# Patient Record
Sex: Female | Born: 1960 | Race: White | Hispanic: No | Smoking: Current every day smoker
Health system: Southern US, Community
[De-identification: ages and names within clinical notes are randomized; demographics above are authoritative.]

## PROBLEM LIST (undated history)

## (undated) DIAGNOSIS — F17200 Nicotine dependence, unspecified, uncomplicated: Secondary | ICD-10-CM

## (undated) DIAGNOSIS — K219 Gastro-esophageal reflux disease without esophagitis: Secondary | ICD-10-CM

## (undated) DIAGNOSIS — F329 Major depressive disorder, single episode, unspecified: Secondary | ICD-10-CM

## (undated) DIAGNOSIS — J449 Chronic obstructive pulmonary disease, unspecified: Secondary | ICD-10-CM

## (undated) DIAGNOSIS — N3281 Overactive bladder: Secondary | ICD-10-CM

## (undated) DIAGNOSIS — F419 Anxiety disorder, unspecified: Secondary | ICD-10-CM

## (undated) DIAGNOSIS — N3010 Interstitial cystitis (chronic) without hematuria: Secondary | ICD-10-CM

## (undated) DIAGNOSIS — I10 Essential (primary) hypertension: Secondary | ICD-10-CM

## (undated) DIAGNOSIS — F32A Depression, unspecified: Secondary | ICD-10-CM

## (undated) DIAGNOSIS — E785 Hyperlipidemia, unspecified: Secondary | ICD-10-CM

## (undated) DIAGNOSIS — IMO0001 Reserved for inherently not codable concepts without codable children: Secondary | ICD-10-CM

## (undated) DIAGNOSIS — E049 Nontoxic goiter, unspecified: Secondary | ICD-10-CM

## (undated) DIAGNOSIS — K589 Irritable bowel syndrome without diarrhea: Secondary | ICD-10-CM

## (undated) DIAGNOSIS — G43909 Migraine, unspecified, not intractable, without status migrainosus: Secondary | ICD-10-CM

## (undated) DIAGNOSIS — K449 Diaphragmatic hernia without obstruction or gangrene: Secondary | ICD-10-CM

## (undated) HISTORY — DX: Interstitial cystitis (chronic) without hematuria: N30.10

## (undated) HISTORY — PX: SHOULDER SURGERY: SHX246

## (undated) HISTORY — DX: Depression, unspecified: F32.A

## (undated) HISTORY — PX: HERNIA REPAIR: SHX51

## (undated) HISTORY — PX: TONSILLECTOMY: SUR1361

## (undated) HISTORY — DX: Anxiety disorder, unspecified: F41.9

## (undated) HISTORY — DX: Nicotine dependence, unspecified, uncomplicated: F17.200

## (undated) HISTORY — DX: Nontoxic goiter, unspecified: E04.9

## (undated) HISTORY — PX: ADENOIDECTOMY: SHX5191

## (undated) HISTORY — PX: NECK SURGERY: SHX720

## (undated) HISTORY — DX: Major depressive disorder, single episode, unspecified: F32.9

## (undated) HISTORY — PX: CHOLECYSTECTOMY: SHX55

## (undated) HISTORY — DX: Irritable bowel syndrome, unspecified: K58.9

## (undated) HISTORY — DX: Migraine, unspecified, not intractable, without status migrainosus: G43.909

## (undated) HISTORY — DX: Overactive bladder: N32.81

## (undated) HISTORY — PX: ABDOMINAL HYSTERECTOMY: SHX81

---

## 1998-08-01 ENCOUNTER — Encounter: Payer: Self-pay | Admitting: Emergency Medicine

## 1998-08-01 ENCOUNTER — Emergency Department (HOSPITAL_COMMUNITY): Admission: EM | Admit: 1998-08-01 | Discharge: 1998-08-01 | Payer: Self-pay | Admitting: Emergency Medicine

## 2000-03-13 ENCOUNTER — Encounter: Admission: RE | Admit: 2000-03-13 | Discharge: 2000-03-13 | Payer: Self-pay | Admitting: Neurology

## 2000-03-13 ENCOUNTER — Encounter: Payer: Self-pay | Admitting: Neurology

## 2000-03-17 ENCOUNTER — Encounter: Payer: Self-pay | Admitting: Neurology

## 2000-03-17 ENCOUNTER — Encounter: Admission: RE | Admit: 2000-03-17 | Discharge: 2000-03-17 | Payer: Self-pay | Admitting: Neurology

## 2000-06-28 ENCOUNTER — Ambulatory Visit (HOSPITAL_COMMUNITY): Admission: RE | Admit: 2000-06-28 | Discharge: 2000-06-28 | Payer: Self-pay | Admitting: *Deleted

## 2000-06-28 ENCOUNTER — Encounter: Payer: Self-pay | Admitting: Orthopedic Surgery

## 2000-07-10 ENCOUNTER — Encounter: Payer: Self-pay | Admitting: Orthopedic Surgery

## 2000-07-10 ENCOUNTER — Ambulatory Visit (HOSPITAL_COMMUNITY): Admission: RE | Admit: 2000-07-10 | Discharge: 2000-07-10 | Payer: Self-pay | Admitting: Orthopedic Surgery

## 2000-08-30 ENCOUNTER — Encounter: Payer: Self-pay | Admitting: Gastroenterology

## 2000-08-30 ENCOUNTER — Ambulatory Visit (HOSPITAL_COMMUNITY): Admission: RE | Admit: 2000-08-30 | Discharge: 2000-08-30 | Payer: Self-pay | Admitting: Gastroenterology

## 2000-08-31 ENCOUNTER — Encounter: Payer: Self-pay | Admitting: *Deleted

## 2000-09-04 ENCOUNTER — Encounter: Payer: Self-pay | Admitting: *Deleted

## 2000-09-04 ENCOUNTER — Ambulatory Visit (HOSPITAL_COMMUNITY): Admission: RE | Admit: 2000-09-04 | Discharge: 2000-09-05 | Payer: Self-pay | Admitting: *Deleted

## 2000-09-05 ENCOUNTER — Encounter: Payer: Self-pay | Admitting: *Deleted

## 2000-10-17 ENCOUNTER — Encounter: Payer: Self-pay | Admitting: *Deleted

## 2000-10-17 ENCOUNTER — Ambulatory Visit (HOSPITAL_COMMUNITY): Admission: RE | Admit: 2000-10-17 | Discharge: 2000-10-17 | Payer: Self-pay | Admitting: *Deleted

## 2001-02-20 ENCOUNTER — Encounter: Payer: Self-pay | Admitting: *Deleted

## 2001-02-20 ENCOUNTER — Ambulatory Visit (HOSPITAL_COMMUNITY): Admission: RE | Admit: 2001-02-20 | Discharge: 2001-02-20 | Payer: Self-pay | Admitting: *Deleted

## 2001-02-26 ENCOUNTER — Encounter (INDEPENDENT_AMBULATORY_CARE_PROVIDER_SITE_OTHER): Payer: Self-pay | Admitting: Specialist

## 2001-02-26 ENCOUNTER — Encounter: Payer: Self-pay | Admitting: *Deleted

## 2001-02-26 ENCOUNTER — Ambulatory Visit (HOSPITAL_COMMUNITY): Admission: RE | Admit: 2001-02-26 | Discharge: 2001-02-27 | Payer: Self-pay | Admitting: *Deleted

## 2003-01-17 ENCOUNTER — Ambulatory Visit (HOSPITAL_COMMUNITY): Admission: RE | Admit: 2003-01-17 | Discharge: 2003-01-17 | Payer: Self-pay | Admitting: *Deleted

## 2003-01-17 ENCOUNTER — Encounter: Payer: Self-pay | Admitting: *Deleted

## 2003-01-20 ENCOUNTER — Ambulatory Visit (HOSPITAL_COMMUNITY): Admission: RE | Admit: 2003-01-20 | Discharge: 2003-01-20 | Payer: Self-pay | Admitting: *Deleted

## 2003-01-20 ENCOUNTER — Encounter: Payer: Self-pay | Admitting: *Deleted

## 2003-01-28 ENCOUNTER — Encounter: Payer: Self-pay | Admitting: *Deleted

## 2003-01-28 ENCOUNTER — Ambulatory Visit (HOSPITAL_COMMUNITY): Admission: RE | Admit: 2003-01-28 | Discharge: 2003-01-28 | Payer: Self-pay | Admitting: *Deleted

## 2003-02-11 ENCOUNTER — Inpatient Hospital Stay (HOSPITAL_COMMUNITY): Admission: RE | Admit: 2003-02-11 | Discharge: 2003-02-12 | Payer: Self-pay | Admitting: *Deleted

## 2003-02-11 ENCOUNTER — Encounter: Payer: Self-pay | Admitting: *Deleted

## 2004-03-24 ENCOUNTER — Ambulatory Visit: Payer: Self-pay | Admitting: Specialist

## 2004-12-05 ENCOUNTER — Emergency Department: Payer: Self-pay | Admitting: Unknown Physician Specialty

## 2005-01-25 ENCOUNTER — Inpatient Hospital Stay: Payer: Self-pay | Admitting: Obstetrics and Gynecology

## 2005-01-25 ENCOUNTER — Other Ambulatory Visit: Payer: Self-pay

## 2005-02-26 ENCOUNTER — Emergency Department (HOSPITAL_COMMUNITY): Admission: EM | Admit: 2005-02-26 | Discharge: 2005-02-26 | Payer: Self-pay | Admitting: Emergency Medicine

## 2005-03-03 ENCOUNTER — Emergency Department (HOSPITAL_COMMUNITY): Admission: EM | Admit: 2005-03-03 | Discharge: 2005-03-03 | Payer: Self-pay | Admitting: Emergency Medicine

## 2005-05-06 ENCOUNTER — Ambulatory Visit (HOSPITAL_COMMUNITY): Admission: RE | Admit: 2005-05-06 | Discharge: 2005-05-06 | Payer: Self-pay | Admitting: Neurosurgery

## 2005-06-07 ENCOUNTER — Ambulatory Visit: Payer: Self-pay | Admitting: Cardiovascular Disease

## 2006-02-21 ENCOUNTER — Encounter: Admission: RE | Admit: 2006-02-21 | Discharge: 2006-02-21 | Payer: Self-pay | Admitting: Cardiology

## 2007-10-09 ENCOUNTER — Encounter: Admission: RE | Admit: 2007-10-09 | Discharge: 2007-10-09 | Payer: Self-pay | Admitting: Cardiology

## 2007-10-18 ENCOUNTER — Encounter: Admission: RE | Admit: 2007-10-18 | Discharge: 2007-10-18 | Payer: Self-pay | Admitting: Cardiology

## 2007-11-14 ENCOUNTER — Ambulatory Visit: Payer: Self-pay | Admitting: Vascular Surgery

## 2007-11-14 ENCOUNTER — Ambulatory Visit: Payer: Self-pay | Admitting: Cardiology

## 2007-11-14 ENCOUNTER — Encounter (INDEPENDENT_AMBULATORY_CARE_PROVIDER_SITE_OTHER): Payer: Self-pay | Admitting: *Deleted

## 2007-11-14 ENCOUNTER — Inpatient Hospital Stay (HOSPITAL_COMMUNITY): Admission: EM | Admit: 2007-11-14 | Discharge: 2007-11-16 | Payer: Self-pay | Admitting: Emergency Medicine

## 2008-01-09 ENCOUNTER — Emergency Department: Payer: Self-pay | Admitting: Emergency Medicine

## 2008-01-30 ENCOUNTER — Encounter
Admission: RE | Admit: 2008-01-30 | Discharge: 2008-01-31 | Payer: Self-pay | Admitting: Physical Medicine & Rehabilitation

## 2008-01-31 ENCOUNTER — Ambulatory Visit: Payer: Self-pay | Admitting: Physical Medicine & Rehabilitation

## 2008-02-11 ENCOUNTER — Emergency Department: Payer: Self-pay | Admitting: Emergency Medicine

## 2009-09-18 DEATH — deceased

## 2009-10-02 ENCOUNTER — Ambulatory Visit: Payer: Self-pay | Admitting: Surgery

## 2010-01-27 ENCOUNTER — Ambulatory Visit: Payer: Self-pay | Admitting: Surgery

## 2010-02-01 ENCOUNTER — Ambulatory Visit: Payer: Self-pay | Admitting: Surgery

## 2010-07-10 ENCOUNTER — Encounter: Payer: Self-pay | Admitting: *Deleted

## 2010-07-11 ENCOUNTER — Encounter: Payer: Self-pay | Admitting: Orthopedic Surgery

## 2010-11-02 NOTE — H&P (Signed)
[name], [name]              ACCOUNT NO.:  [id number]   MEDICAL RECORD NO.:  [id number]          PATIENT TYPE:  EMS   LOCATION:  MAJO                         FACILITY:  [facility]   PHYSICIAN:  Madaline Savage, MD        DATE OF BIRTH:  03/12/1961   DATE OF ADMISSION:  11/13/2007  DATE OF DISCHARGE:                              HISTORY & PHYSICAL   PRIMARY CARE PHYSICIAN:  [facility].   CHIEF COMPLAINTS:  Altered mental status, slurred speech.   HISTORY OF PRESENT ILLNESS:  [name] is a 50 year old, obese lady  with a history of hypertension and hyperlipidemia who comes in with  acute onset of slurred speech and confusion.  Ms. Torre tells me that  she has not been feeling well for the last 1 year.  She states that she  has been feeling tired without any energy and for the last couple of  days she said she felt very confused and felt very foggy.  This morning  she had slurred speech which lasted all day.  She is on narcotics for  her chronic headache, but she states she has not taken any extra pain  medications recently.  She denies any weakness, any sensory loss.  At  this time she is back to her baseline.   PAST MEDICAL HISTORY:  1. Hypertension.  2. Hyperlipidemia.  3. History of refractory headaches.  4. Gastroesophageal reflux disease.   PAST SURGICAL HISTORY:  She has had multiple back surgeries for chronic  back pain.  She is on narcotics chronically.   ALLERGIES:  She is allergic to SULFA, ASPIRIN, IBUPROFEN.   CURRENT MEDICATIONS:  She is not able to tell me all her medication she  takes at home.  She tells me she is on:  1. Imitrex 100 mg as needed.  2. She is also on Percocet 1 tablet every 4 hours as needed.  3. Ambien 10 mg at bedtime.  4. She is also on Premarin but she does not know the dose of      medication.   SOCIAL HISTORY:  She is disabled.  She has three kids who are healthy.  She smokes six to seven cigarettes a day.  She states  she has smoked for  approximately 30 years.  Denies any history of alcohol or drug abuse.   FAMILY HISTORY:  Her father is 1.  He has had bypass graft in the past.  Her mother is 13.  She has chronic back pain and is bedridden from her  back pain.   REVIEW OF SYSTEMS:  GENERAL:  She denies any recent weight loss or  weight gain.  She denies any fever or chills.  HEENT:  No headaches, no  blurred vision or sore throat.  CARDIOVASCULAR SYSTEM:  Denies chest  pain, palpitations.  RESPIRATORY SYSTEM:  Shortness of breath or cough.  GI:  No abdominal pain, nausea, vomiting, diarrhea, constipation.  GENITOURINARY:  No dysuria, polyuria.  EXTREMITIES:  No tingling or  numbness of toes or fingers.   PHYSICAL EXAMINATION:  GENERAL:  She is alert  and oriented x3.  VITAL SIGNS:  Temperature is 98.3, pulse rate of 76, blood pressure is  131/62, respiratory rate 18, oxygen saturation 99% on room air.  HEENT:  Head atraumatic, normocephalic.  Pupils bilaterally equal and  react to light.  Mucous membranes appear moist.  NECK:  Supple.  No JVD,  no carotid.  CARDIOVASCULAR:  S1 and S2.  Regular rhythm.  CHEST:  Clear to auscultation.  ABDOMEN:  Soft.  Bowel sounds heard.  EXTREMITIES:  No edema.  Some clubbing.  CENTRAL NERVOUS SYSTEM:  No focal neurological deficits identified.  Speech is normal.   LABORATORY DATA:  White count 60, hemoglobin 13.7, platelets 241,000.  Sodium 137, potassium 3.8, chloride 102, bicarb 29, and BUN 5,  creatinine 0.6.  Glucose 92.  AST 19, ALT 24, alk phos 651.  Urine drug  screen is positive for benzodiazepine and opiates.  Urinalysis is  negative.  Alcohol is less than 5.  Phenobarb level is less than 5.  INR  is 0.9.  CT head showed no acute intracranial abnormalities.   IMPRESSION:  1. Slurred speech and altered mental status, likely a transient      ischemic attack.  2. Hypertension.  3. Hyperlipidemia.  4. Gastroesophageal reflux disease.   PLAN:  1.  Altered mental status.  This is a 50 year old lady with history of      hypertension and hyperlipidemia who comes in with slurred speech      for 1 day duration.  She also states she was confused yesterday.      This could likely be a transient ischemic attack.  Her initial CT      scan does not show any obvious abnormalities.  Will get an MRI/MRA      on her.  I will also do a 2-D echocardiogram and carotid Dopplers.      She will need aggressive risk factor modification. I will get a      fasting lipid profile, homocystine, hemoglobin A1C.  She does not      remember which medications she takes at home for her blood      pressure.  We will restart her medications when she brings her      medications tomorrow.  There is also possibility that her altered      mental status is likely due to narcotic pain medications and      benzodiazepines.  I will decrease her dose of Ambien at this time      and treated her with IV Dilaudid at this time.  2. Hypertension.  Blood pressure is slightly elevated at this time.      Will restart her blood pressure medications once her dose of      medications are known.  3. Hyperlipidemia.  Will get a fasting profile on her.  4. I will put on DVT prophylaxis.      Madaline Savage, MD  Electronically Signed     PKN/MEDQ  D:  11/14/2007  T:  11/14/2007  Job:  [id number]

## 2010-11-02 NOTE — Discharge Summary (Signed)
NAMENAIJAH, LACEK              ACCOUNT NO.:  [id number]   MEDICAL RECORD NO.:  [id number]          PATIENT TYPE:  INP   LOCATION:  5501                         FACILITY:  [facility]   PHYSICIAN:  Isidor Holts, M.D.  DATE OF BIRTH:  06/11/1961   DATE OF ADMISSION:  11/13/2007  DATE OF DISCHARGE:  11/16/2007                               DISCHARGE SUMMARY   PRIMARY MEDICAL DOCTOR:  Laverna Peace, MD, [facility].   DISCHARGE DIAGNOSES:  1. Possible transient ischemic attack.  2. Dyslipidemia.  3. Migraine headaches.  4. Chronic back pain.  5. Gastroesophageal reflux disease.  6. Hypertension.  7. Morbid obesity.  8. Medication side effect.   DISCHARGE MEDICATIONS:  1. Premarin 0.625 mg p.o. daily.  2. Simvastatin 40 mg p.o. q.h.s.  3. Zetia 10 mg p.o. daily.  4. Allegra 180 mg p.o. p.r.n. daily.  5. Protonix 40 mg p.o. daily.  6. Robaxin 500 mg p.o. t.i.d.  7. Opana ER 10 mg p.o. b.i.d. (was on 20 mg p.o. b.i.d.)  8. Percocet (10/325) one p.o. p.r.n. q.6 h.  9. Plavix 75 mg p.o. daily.   PROCEDURES:  1. Head CT scan dated Nov 13, 2007  This showed no acute intracranial      abnormality.  2. Brain MRI dated Nov 14, 2007.  This was a normal examination.  3. Bilateral carotid/vertebral artery duplex scans dated Nov 14, 2007.      This showed  bilaterally mild intimal wall changes of the CCA.  No      evidence of significant plaque noted.  Right vertebral artery flow      is antegrade.  Left vertebral artery flow could not be adequately      evaluated due to several neck surgeries.  No significant right or      left ICA stenosis noted.  4. A 2-D echocardiogram dated Nov 14, 2007.  This showed overall      normal left ventricular systolic function.  EF 60%.  This study was      inadequate for evaluation of left ventricular regional wall motion.      It is was technically difficult.  No obvious cardiac source of      embolus.  No obvious PFO.   CONSULTATIONS:  None.   ADMISSION HISTORY:  As in H&P notes of Nov 13, 2007 dictated by Dr. Oneita Hurt.  However in brief, this is a 50 year old female, with history of  hypertension, dyslipidemia, refractory migraine headaches, GERD, chronic  back pain status post multiple previous back surgeries on chronic pain  medication.  She presents with altered mental status, described as acute  confusion associated with slurred speech.  However on detailed  questioning, it appears that she has been feeling progressively tired.  Over the last few days, she has been feeling very confused and foggy.  On initial examination, she had no obvious focal neurologic deficit.  However, she was admitted for further evaluation, investigation and  management.   CLINICAL COURSE:  1. Possible transient ischemic attack.  For details of presentation,  refer to the admission history, above.  The patient had transient      slurred speech and altered mental status which appeared clinically      consistent with transient ischemic attack.  However, at the time of      physical examination, these findings were no longer present.  She      underwent full CVA workup.  For details of findings, refer to the      procedure list above.  These were negative.  She has been commenced      on antiplatelet medication, i.e. Plavix, given her history of      allergy to Aspirin.  Risk factor modification is to be pursued.   1. Medication side effects.  The patient is chronically on opioid      medications and antihistaminics.  During the course of her      hospitalization, she quite frequently requested Percocet for pain      control.  Requested intravenous antihistaminics for itching.  It      is likely that the feeling of fogginess that she has been      describing, may be due to medication side effect.  Be that as it      may, her opioid medications were reduced by half and her mental      status dramatically improved  during the course of her      hospitalization.  She has been counseled to pursue further      reduction in her opioid medication.  However, we shall defer this      to her primary MD.   1. Chronic back pain.  The patient has a history of chronic back pain      as outlined above and has had previous multiple surgeries.  During      the course of her hospitalization, back pain did not prove      problematic.  She was evaluated by PT/OT and recommended continued      PT/OT as an outpatient.  We have recommended that her primary MD      arrange this.   1. Hypertension.  The patient's blood pressure remained low normal,      during the course of her hospitalization off antihypertensive      medications.  As a matter of fact on Nov 16, 2007, BP was 100/68      mmHg.  Her Micardis has therefore been discontinued without any      deleterious effects.  We anticipate her primary MD will continue to      monitor her BP and may wish to reinstate medications, if this      proves to be indicated.  We have also counseled the patient to      attempt to lose some weight as she may be able to stay off      antihypertensive medications.  It is also feasible that low BP may      have been contributory to her presenting symptoms.   1. History of gastroesophageal reflux disease.  There were no problems      referable to this.  The patient was managed with proton pump      inhibitors.   1. History of migraines.  This was managed with p.r.n. analgesics.      Imitrex has been discontinued, against background of possible TIA.   1. Dyslipidemia.  The patient's lipid profile was as follows:  Total      cholesterol 136,  triglycerides 74, HDL 39, ALT 82.  She continues      on pre-admission lipid-lowering medications.   DISPOSITION:  The patient was on Nov 16, 2007 considered sufficiently  clinically recovered and stable to be discharged.  She was therefore  discharged accordingly.   ACTIVITY:  As  tolerated.   DIET:  Heart-healthy.   FOLLOW UP INSTRUCTIONS:  The patient is recommended to follow up with  her primary MD Dr. Maryruth Hancock [facility]  in 1-2 weeks.  She has been  instructed to call for an appointment.   DISCHARGE INSTRUCTIONS:  1. The patient is recommended outpatient physical therapy/occupational      therapy.  We  recommend that the primary MD arrange this as this      may be quite helpful for her back pain.   Note:  The following medications have been discontinued:  Micardis,  Imitrex, Robinul, until reviewed by the patient's primary MD.  All this  has been communicated to the patient.  She has verbalized understanding.      Isidor Holts, M.D.  Electronically Signed     CO/MEDQ  D:  11/16/2007  T:  11/16/2007  Job:  [id number]   cc:   Laverna Peace, MD

## 2010-11-02 NOTE — Group Therapy Note (Signed)
REASON FOR VISIT:  Consult requested for the evaluation of neck pain and  back pain.   HISTORY:  A 50 year old female who has a long history of neck pain.  She  has had 3 cervical surgeries.  In 2002, she reports having had 2  cervical surgeries and in 2004, her third and final.  Dr. Hessie Knows  did the surgery.  She has had persistent pain postoperatively, really  does not think it has helped any.  She has migraine-type pain as well.  In addition, she has low back pain, which really started about a year  ago.  She has had no history of trauma to the low back region.  She has  been treated with narcotic analgesics by her primary care, however, had  an episode last May where she was admitted for mental status changes.  A  stroke was ruled out, and it was felt that the altered mental status was  due to narcotic analgesic medications.  She was in the ER at the time  with some oxycodone.  She was discharged on a lower dose.   PAST SURGICAL HISTORY:  As noted above.  She has had C5-C6, C6-C7  microdiskectomy, and fusion as well as corpectomy at C6 in 2002.  She  had an extended fusion from C2 through C6 in September of 2002, and then  another procedure where previous hardware was removed and fused from C4  to C1.   Her pain level was rated as 10/10.  Sleep is poor.  Pain improves with  medications.  She walks 10 minutes at a time, but overall has very  limited functional ambulation distance.  She can climb steps, she  drives, she has some family help with meal prep, household duties, and  shopping.  She indicates she has been on disability since 2002.   REVIEW OF SYSTEMS:  Positive for depression and anxiety, but negative  for suicidal thoughts.  Review of systems is also positive for weight gain and reflux.  Her  other past medical history is significant for hypertension and  hyperlipidemia.   CURRENT MEDICATIONS:  She is not taking any pain medications.  She is on  Hyzaar, Zetia,  glycopyrrolate, Protonix, Premarin, Zocor, aspirin,  Allegra, and Imitrex as well as alprazolam 1 mg t.i.d.   FAMILY HISTORY:  Heart disease, lung disease, diabetes, high blood  pressure, and disability.   SOCIAL HISTORY:  Divorced, lives with parents.  Smoked half pack a day.  Denies any alcohol or drug abuse.   PHYSICAL EXAMINATION:  VITAL SIGNS:  Blood pressure 143/93, pulse 91,  respirations 18, and O2 sat 98% on room air.  GENERAL:  No acute distress.  Mood and affect appropriate.  Gait is  normal.  She is able to toe walk and heel walk.  Her neck has  essentially no range of motion.  She has no significant hypersensitivity  over the posterior scar.  She does have defect from the multiple surgery  sites with scarring.  She has tenderness to palpation in the thoracic  and lumbar paraspinal.  She has full strength in bilateral deltoid,  biceps, triceps grip as well as hip flexor, knee extensor, and  dorsiflexion.  She has normal sensation in bilateral upper and lower  extremities.  Normal deep tendon reflexes in bilateral upper and lower  extremities.  Her lumbar spine range of motions is 75% forward flexion  and extension.  Slightly more pain with extension than with flexion.   Review  of MRI findings:  Lumbar spine, she really has minimal disk  bulges at L3-L4, L4-L5, and these are more likely just age related  rather than source of pain.   In addition, she has had a repeat MRI of the cervical spine which shows  no complications from her prior fusion.   IMPRESSION:  1. Chronic postoperative pain, chronic neck pain.  2. Lumbar axial pain, no evidence of radiculopathy.  MRI showing no      significant disk problems.  May have facet versus sacroiliac      involvement versus myofascial.   PLAN:  1. Overall, given her problem with altered mental status requiring      hospitalization, we will not treat with narcotic analgesics.  We      will start her on tramadol 50 mg t.i.d.   2. Due to decreased her activity she has been deconditioned.  She has      fear-avoidance behaviors with exercise.  We will have her start      with some physical therapy and not have them work anything with her      cervical spine given that she was basically fused from head to      upper thorax.  We will instead concentrate on thoracic and lumbar      mobility and strengthening as well as lower extremity      strengthening.  I will see her back in 1 month to followup on her      progress.   Thank you for this interesting consultation.  Keep you apprised of her  situation.      Erick Colace, M.D.  Electronically Signed     AEK/MedQ  D:  01/31/2008 15:55:20  T:  02/01/2008 07:06:23  Job #:  [id number]   cc:   Penne Lash

## 2010-11-05 NOTE — Procedures (Signed)
[facility]  Patient:    Katelyn [name], Katelyn [name] Visit Number: [id number] MRN: [id number]          Service Type: DSU Location: [facility] [id number] Attending Physician:  Evonnie Dawes Dictated by:   Donnal Debar Gasper Sells, M.D. Proc. Date: 02/20/01 Admit Date:  02/20/2001   CC:         Clabe Seal. Meryl Crutch, M.D.  Jearld Adjutant, M.D.   Procedure Report  PREOPERATIVE DIAGNOSIS:  Query cervicogenic headache plus-or-minus occipital neuralgia.  POSTOPERATIVE DIAGNOSES: 1. 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 2. Symptomatic left C2 root and ganglion.  OPERATION: 1. Left C4-5, left C3-4 and left C2-3 facet blocks in that order with 1 cc of    0.5% bupivacaine without epinephrine in 0.5 cc aliquots at each level. 2. Left C2 root and ganglion block, 2 cc in 0.5 cc aliquots of    0.5% bupivacaine.  SURGEON:  Ricky D. Gasper Sells, M.D.  ASSISTANT:  Nil.  COMPLICATIONS:  Nil.  ANESTHESIA:  Marcaine 0.5% premedication with 0.2 mg of Robinul.  SUMMARY:  This is a 50 year old female with a long-standing history of neck pain and headache.  I previously did anterior C5-6 and C6-7 diskectomies and fusion, September 04, 2000, with positive results with respect to some of her neck pain and her radiculopathies, but this did not affect her headache in any way. These have become very difficult to manage.  She is on Zoloft, Premarin, Neurontin, Zanaflex and Imitrex for her headaches and is incompletely controlled on these medications.  Her story is difficult to follow.  The headaches can be bilaterally, they can start on the left, they can start on the right, seem to detonate a vascular type of headache.  There is an underlying rumbling cervical component to her pain and when it reaches a certain threshold level, bang, she gets a headache.  Today, she was primarily symptomatic on the left side.  She had had a  recent exacerbation of her trouble where she was incapacitated for a day or so just a few days prior, but her headaches were about at an average level today, again, primarily left-sided.  A preoperative video flexion/extension view did not definitively show any abnormal motion, however, the C4-5 level on the left was suspicious.  She had point tenderness over the superomesial spine of the scapula, which is the origin of the levator ______ scapulae muscle or so-called Maights point. She was also tender over the upper cervico-zygapophyseal joints to palpation. She also had point tenderness over the greater and lesser occipital nerves on the left.  I blocked her left C4-5 joint, following that the C3-4 joint and then the C2-3 joints, the C2-3 and C3-4 being the more symptomatic levels. She still had residual hemicranial pain.  I then inserted a needle down to the mid-third of the C1-2 joint in the AP projection and blocked that level and this completely relieved her left-sided discomfort.  This implies that the upper cervical spine including the C2 root and ganglion is intimately related to the genesis of her headache problem and in addition, her neck pain.  Medical versus surgical options will be discussed with the patient.  DESCRIPTION OF PROCEDURE:  With the patient in the prone position and with the patients chest supported by a pillow, with the upper cervical spine and skull prepped with Betadine solution and with the C-arm in place, three 25-gauge needles were  inserted down to the C4-5, C3-4 and C2-3 joints in that order on the left side.  Poking the dorsolateral aspects of these joints re-created her more lateral discomfort and some pain radiating into the left ear.  The C2-3 and C3-4 joints were more symptomatic than the C4-5 level.  I then blocked the C4-5 level with 1 cc of 0.5% bupivacaine in 0.5 cc aliquots, blocking the dorsal aspect of the joint first, and then moving the  needle more laterally, to catch in this case the fifth occipital nerve.  This gave her minimal relief of her left-sided discomfort, however, some relief.  I then blocked the C3-4 joint with exactly the same technique, followed by the C2-3 joint and this blocked the more lateral aspect of her headache as well as much of her neck pain.  She still had a significant amount of headache left.  I then inserted a 25-gauge spinal needle down to the mid one-third of the C1-2 joint, which is the location of the C2 root and ganglion; I blocked that level with a total of 2 cc of 0.5% bupivacaine in 0.5 cc aliquots.  This took care of the remainder of her pain and relieved her headache.  Based on this study, one can conclude that the upper cervical structures are intimately related to the cervicogenic component of her headache and that she has a component of occipital neuralgia or C2 root and ganglion dysfunction as well.  Surgical versus continued medical options will be discussed with the patient.  Patient tolerated the procedure well and was resting quietly in the recovery room at the end of the procedure. Dictated by:   Donnal Debar Gasper Sells, M.D. Attending Physician:  Evonnie Dawes DD:  02/20/01 TD:  02/20/01 Job: [id number]

## 2010-11-05 NOTE — Op Note (Signed)
[place]  Patient:    Katelyn [name], Katelyn [name]                       MRN: [id number] Proc. Date: 09/04/00 Adm. 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  POSTOPERATIVE DIAGNOSIS:  Herniated nucleus pulposus, C5-6, centered to the left at C6-7 midline.  SUPPLEMENTARY DIAGNOSIS:  Cervicogenic headache versus muscle spasm headache, which may or may not respond to surgical treatment of the above.  OPERATION: 1. C5-6 to C6-7 microdiskectomy for decompression of the dura of the spinal    cord and C6 and C7 nerve roots bilaterally. 2. Corpectomy of 6 for harvesting autograph for later fusion laterally. 3. Strut graft human __________ fibular bone C5 to C7. 4. Fixation with a 42 mm Codman plate with four 15 mm screws in the body of    C5 and C7, and two 12 mm screws in the body of C6 with one inferior screw    fixing the Codman plate in place.  SURGEON:  Ricky D. Gasper Sells, M.D.  ASSISTANT:  Annie Sable, Montez Hageman., M.D.  COMPLICATIONS:  Nil.  DRAINS:  Nil.  ESTIMATED BLOOD LOSS:  Less than 100 cc.  SUMMARY:  This is a 50 year old, white, right-handed female, who presented with severe neck pain, headache, numbness, and tingling primarily involving the thumb, index, and middle fingers of the left hand, with aching in the biceps and triceps on the left.  This actually spread by the time she had surgery, a week later, roughly to involve the right hand as well.  She had an MRI dated May 30, 2000, which showed a moderate sized left-sided herniation at C5-6 and a subligamentous ruptured C6-7 or bulging disk with elevation of the posterior longitudinal ligament.  Her foramen compression test of the left is positive.  Her axial compression test increased her pain. She had slightly decreased biceps and  triceps jerks in the left as compared to the right.  She had a moderate degree of myalgia in palpating these muscles. There was weakness as well at the same time on the left as compared to the right.  The toes were downgoing and there was no evidence of myelopathy.  She also had headache with tenderness over the greater and lesser occipital nerves bilaterally.  She had no sensory disagreements of the scalp.  The remainder of the examination was nonfocal in that it cleared the patient while the obvious problems be corrected.  This may in fact not help her headache at all. It is possible, however, if she is unstable enough at the C5-6 and C6-7 levels, it may be putting unusual stress in the upper cervical zygapophyseal joints and give her headaches on that basis.  I intended to fuse her from C5 to C7 after C5-6 and C6-7 diskectomy, that was carried out today.  Her C6 vertebral body was almost floating freely. It was very unstable at both levels. This was observed not only by me but also by the nurses and Dr. Hope Pigeon.  The diskectomies and fusion and fixation itself was completely uncomplicated.  The patient tolerated the procedure well.  DESCRIPTION OF PROCEDURE:  With the patient in the supine position with the head slightly  extended and elevated with respect to the heart, the patient was prepped and draped in the usual fashion for a right-sided approach of the cervical spine.  The patient had a Bair Hugger blanket in place and PAS hose in place and all pressure points had carefully been inspected and padded. A convenient skin fold was selected and the skin was incised approximately 4 cm in the anterior triangle on the right side along the Langers lines and then dissected subcutaneously.  There was a lot of subcutaneous fat.  The platsyma was divided longitudinally and retracted medially and laterally with a total of four fish hooks.  A plane was developed along the medial aspect of the  jugular vein down to the carotid artery where a left-hand turn was made exposing the anterior cervical spine.  The longus colli muscle was then dissected free of the vertebral bodies of C5, C6 and C7.  A needle was placed in the most superior visible disk space which turned out to be C4-5 by x-ray confirmation.  A Caspar pin was then placed in the vertebral body of C5 and C7 and then Caspar retractors were used to retract the esophagus and trachea medially and the carotid jugular structures laterally thus exposing the C5-6 level.  The microscope was then brought in.  The annulus was then incised at both levels with a 15-blade knife.  Disk material was removed.  A high-speed drill was then used to drill the cartilage end plates at U9-8 and C6-7 off preserving as much bone as possible.  A Lexxel instrument was then used to perform a corpectomy of C6 down to the cortical bone posteriorly.  The high-speed drill was then used to drill off the remaining cartilage end plate down to the posterior longitudinal ligament.  This was divided and removed along with the annulus and disk material that exposed the dura. First, C6-7 level with a foraminotomy at C7 being performed.  There was disk material inside the two layers of ligament particularly to the left side.  The exact same procedure was carried out at C5-6.  The proud posterior longitudinal ligament was then coagulated with bipolar coagulation at both levels to decrease the possibility of OPTL formation.  It also decreases pain.  A custom-fit, truncated, large-dimension, anterior and small dimension posterior fibular strut was then formed and pounded into place.  It fit perfectly.  The patients own bone mixed with Grafton was then packed into the C5-6 and C6-7 lateral spaces to facilitate further bony fusion out to that level, that is a more lateral fusion as well.  A 42 mm plate was then carefully screwed into place using 15 mm screws in  the vertebral body of C5 and C7 and 12 mm screws in the vertebral body of C6 bilaterally, and then a fixation screw into the strut was used through the  most inferior hole of the plate.  A confirmation x-ray was then performed and it appeared that the C5 through C7 levels had been adequately fused with screws in appropriate position.  The wound was then irrigated with Betadine solution and hydrogen peroxide.  A mixture of 25 cc of normal saline and 25 cc of hydrogen peroxide mixed with an ampule of methylene blue was then slowly injected into the NG tube as it was removed you could see it pass by through the filamentous mucosa muscularis of the esophagus.  There were no leaks.  The wound was irrigated one more time with bacitracin solution then the platysma  was closed with three interrupted 2-0 Vicryl sutures.  Subcutaneous closure was carried out with 4-0 Vicryl inverted interrupted x 5, and Steri-Strips were used in the skin and a dry dressing was applied.  The patient tolerated the procedure well and awoke from surgery unremarkably.  A soft collar was placed before she left the operating room. DD:  09/04/00 TD:  09/05/00 Job: [id number]

## 2010-11-05 NOTE — Op Note (Signed)
NAME:  Katelyn [name], Katelyn [name]                          ACCOUNT NO.:  [id number]   MEDICAL RECORD NO.:  [id number]                   PATIENT TYPE:  OIB   LOCATION:  3172                                 FACILITY:  [facility]   PHYSICIAN:  Ricky D. Gasper Sells, M.D.             DATE OF BIRTH:  [birth year]   DATE OF PROCEDURE:  01/20/2003  DATE OF DISCHARGE:                                 OPERATIVE REPORT   PROCEDURE:  1. Bilateral C7-T1 facet block.  2. Fluoroscopic control of same.   SURGEON:  Ricky D. Gasper Sells, M.D.   COMPLICATIONS:  Nil.   ESTIMATED BLOOD LOSS:  Nil.   ANESTHESIA:  With 1 mL of 0.5% bupivacaine without epinephrine, per side.   PREMEDICATION:  With 0.2 mg of Robinul.   INDICATIONS FOR PROCEDURE:  This is a 50 year old female who had an anterior  C5 through C7 fusion about 2-1/2 or 3 years ago, and then two years ago had  a C3 through C6 fused posteriorly.  Certainly her headaches are gone, but  she has had progressive neck pain over the last six months or so.  Three  days ago I did fluoroscopic flexion and extension views on her, and she had  diastatic C7-T1 facet joints clearly demonstrated at that time.  Since this  is bilateral, both of her posterior elements are loose, and therefore she is  unstable; that is 2/3 of her stability structures are unstable.  At the time I took a lead marker and taped it to the end of her finger and  asked her to localize exactly where her neck was tender maximally.  She  placed her finger with the lead marker exactly over the C7-T1 joint area  bilaterally.  I am going to confirm this with an upper cervical block today.  This is carried out without difficulty, apart from the fact that she became  minimally nauseated when she sat up.  She had some postural hypotension,  possibly related to hypovolemia.  The anesthesiologist took care of this.  By discretely blocking the C7-T1 joints, we were able to significantly  reduce her neck pain.  She  was told that she should wear a collar until she  decides to have this fused.  I would rather do it sooner rather than later.   DESCRIPTION OF PROCEDURE:  With the patient in the prone position, the  patient's neck was prepped with Betadine solution and a #25 gauge needle was  inserted down to the C7-T1 facet joint area, first on the right and then on  the left.  Poking this area exquisitely recreated her neck pain.  I then  blocked first the right side with two 0.5 mL aliquots of 0.5% bupivacaine  with aspiration for each injection.  I then blocked the left side with a  similar technique.  The needles were removed.  The patient was sat up.  Her neck pain  was significantly better.                                                Ricky D. Gasper Sells, M.D.   RDH/MEDQ  D:  01/20/2003  T:  01/20/2003  Job:  [id number]
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
## 2010-11-05 NOTE — Op Note (Signed)
[place]  Patient:    Katelyn [name], Katelyn [name]                       MRN: [id number] Adm. Date:  [id number] Attending:  Evonnie Dawes                           Operative Report  PREOPERATIVE DIAGNOSIS:  Herniated nucleus pulposus C5-6 concentric to the left at C6-7 midline.  POSTOPERATIVE DIAGNOSIS:    Herniated nucleus pulposus C5-6 concentric to the left at C6-7 midline.  SUPPLEMENTARY DIAGNOSIS:  ______ versus muscle spasm, headache which may or may not respond to surgical treatment of the above.  OPERATION: 1. C5-6 and C6-7 microdiskectomy for decompression of the dura of the spinal    cord and C6-7 nerve roots bilaterally. 2. Corpectomy of 6 for harvesting autograft for later fusion laterally. 3. Strut graft human ______ fibular bone C5 to C7. 4. Fixation with 42 mm Codman plate with four 15 mm screws in the body of C5    and C7 and two 12 mm screws in the body of C6 with one inferior screw    fixing the Codman plate in place.  SURGEON:  Ricky D. Gasper Sells, M.D.  ASSISTANT:  Annie Sable, Montez Hageman., M.D.  COMPLICATIONS:  Nil.  DRAINS:  Nil.  ESTIMATED BLOOD LOSS:  Less than 100 cc.  SUMMARY:  This is a 50 year old white right-handed female who presented with severe neck pain, headache, and numbness and tingling primarily involving the thumb and index and middle fingers of the left hand and taking in the biceps and triceps on the left.  This actually spread by the time she had surgery a week later, roughly, to involve the right hand as well.  She had an MRI with her dated May 30, 2000 which showed a moderate-sized left-sided herniation at C5-6 and a subligamentous rupture at C6-7 or a bulging disk with elevation of the posterior arm to the ligament.  Her ______ compression test of the left was positive.  Her axial compression test increased her pain.  She had slightly decreased biceps and triceps, jerks on the left as compared  to the right.  She had a moderate degree of myalgia in palpating these muscles. There is weakness as well on the left as compared to the right.  The toes were downgoing.  There was no evidence of myelopathy.  She also had headache with tenderness over the greater and lesser occipital nerves bilaterally.  She had no sensory disturbance at the scalp.  The remainder of the examination was nonfocal.  I made it clear to the patient that, while the obvious problems should be corrected, this may in fact not help her headache at all.  It is possible, however, if she is unstable enough at the C5-6 and C6-7 levels, it may be putting unusual stress in the upper cervical zygapophyseal joints and give her headache on that basis.  I intended to fuse her from C5 to C7 after C5-6 and C6-7 diskectomies.  That was carried out today.  The C6 vertebral body was almost floating freely.  It was very unstable at both levels.  This was observed not only by me but also by the nurses and Dr. Hope Pigeon.  The diskectomies and fusion and fixation itself was completely uncomplicated.  The patient tolerated the procedure well.  DESCRIPTION OF PROCEDURE:  With the patient in the  supine position with the head slightly extended and elevated with respect to the heart, the patient was prepped and draped in the usual fashion for a right-sided approach to the cervical spine.  The patient had a Bair Hugger blanket in place and PS hose in place and all pressure points carefully had been inspected and padded.  A convenient skin fold was selected and the skin was incised approximately 4 cm in the anterior triangle on the right side along the Langers lines and then dissected subcutaneously.  There was a lot of subcutaneous fat.  The platysma was divided longitudinally and retracted medially and laterally with a total of four fishhooks.  A plain was developed along the medial aspect of the jugular vein down to the carotid artery,  where a left-hand turn was made exposing the anterior cervical spine.  Longus colli muscle was then dissected free of the vertebral bodies of C5, C6, and C7.  A needle was placed in the most superior visible disk space, which turned out to be C4-5 by x-ray confirmation.  Caspar pin was then placed in the vertebral body of C5 and C7 and then Caspar retractors were used to retract the esophagus trachea medially and the carotid jugular structures laterally, thus exposing the C5-6 level.  The microscope was then brought in.  The annulus was then incised at both levels with a 15-blade knife.  Disk material was removed.  A high-speed drill was then used to drill cartilage end plates at B1-4 and C6-7 off preserving as much bone as possible.  A ______ instrument was then used to perform a corpectomy of C6 down to the cortical bone posteriorly.  The high-speed drill was then used to drill off the remaining cartilage end plate down to the posterior longitudinal ligament.  This was divided and removed, along with the annulus and disk material to expose the dura.  First, the C6-7 level with a foraminotomy of C7 being performed.  There was disk material inside the two layers of ligament, particularly to the left side.  Exactly the same procedure was carried out at C5-6.  The posterior longitudinal ligament was then coagulated with ______ coagulation at both levels to decrease the possibility of OPPL formation.  It also decreases pain.  A custom-fit truncated large dimension anterior and small dimension posterior fibular strut was then formed and pounded into place.  It fit perfectly.  The patients own bone mixed with graft was then packed into the C5-6 and C6-7 lateral spaces to facilitate through the bony fusion out to that level.  That is a more lateral fusion as well.  A 42-mm plate was then carefully screwed into place using 15-mm screws in the vertebral body of C5 and C7 and 12-mm screws in  the vertebral body of C6 bilaterally, and then a fixation screw into the strut was used through the most inferior hole of the plate.  A confirmation x-ray was then performed and  it appeared that the C5 through C7 levels had been adequately fused with screws in appropriate position.  The wound was then irrigated with Betadine solution and hydrogen peroxide.  A mixture of 25 cc of normal saline and 25 cc of hydrogen peroxide mixed with an ampulla of methylene blue was then slowly injected into the NG tube.  As it was removed, you could see it pass by through the filamentous mucosa and muscularis of the esophagus.  There were no leaks.  The wound was irrigated one more time with  bacitracin solution and the platysma was closed with three interrupted 2-0 Vicryl sutures.  Subcutaneous closure was carried out with 4-0 Vicryl ______ interrupted x 5 and Steri-Strips were used in the skin and a dry dressing was applied.  The patient tolerated the procedure well and awoke from surgery unremarkably.  A soft collar was placed before she left the operating room. DD:  09/04/00 TD:  09/05/00 Job: [id number]

## 2010-11-05 NOTE — Op Note (Signed)
NAME:  Katelyn [name], Katelyn [name]                          ACCOUNT NO.:  [id number]   MEDICAL RECORD NO.:  [id number]                   PATIENT TYPE:  INP   LOCATION:  3172                                 FACILITY:  [facility]   PHYSICIAN:  Ricky D. Gasper Sells, M.D.             DATE OF BIRTH:  1960/08/06   DATE OF PROCEDURE:  DATE OF DISCHARGE:                                 OPERATIVE REPORT   PREOPERATIVE DIAGNOSIS:  Instability C7-T1, well-documented.   INTRAOPERATIVE DIAGNOSIS:  Instability C7-T1 with some instability at C4-5  as evidenced by examining the hardware and abnormal motion at that level.   PROCEDURE:  1. Exposure of the posterior fusion hardware and evaluation of stability.  2. Bilateral C7-T1 cervical laminectomies.  3. Removal of previous posterior hardware C4-C6.  4. Harvesting of autografts spinous process of C5 as well as bone     marrow/blood.  5. Fusion C4-T1 using mixed autograft and allograft (allomatrix mixed with     the patient's own blood, bone marrow, and autograft).  6. Fixation using Synthes axon system with lateral maskers in C4, C5, C6,     and C7, and pedicle screws in T1.   SURGEON:  Ricky D. Gasper Sells, M.D.   ASSISTANT:  Marland Mcalpine. Elesa Hacker, M.D.   COMPLICATIONS:  None.   ESTIMATED BLOOD LOSS:  Less than 150 cc.   SUMMARY:  This is a 50 year old female who had a remote C5-C7 anterior  fusion. She became symptomatic with neck pain and headache after that and  had a C2-C6 fusion done posteriorly about two years ago. This held up until  approximately six months ago when she became symptomatic at the base of her  neck. Fluoroscopic flexion and extension views diastasis of the C7-T1 facet  joint. Blocking that joint later on that same day gave her excellent relief  of her pain. Interestingly, she was able to clearly localize her discomfort  in the C7-T1 level. Based on that it was elected to extend her fusion down  one level.   When the hardware was exposed  the C4-5 level was also loose, so it was  elected to fuse her from C4-T1 with the C4-5 and C7-T1 facet fusions being  done at that time. This was carried. Fixation was with the Synthes axon  pedicle screw top loading system. That was carried out without complication  or difficulty. The patient tolerated the procedure well. No complications  occurred. The patient awoke from surgery without her usual neck pain, but  with soreness from the surgery and no new neurological deficits.   DESCRIPTION OF PROCEDURE:  With the patient in the prone position with the  head in the Mayfield head flap and with the patient carefully positioned in  the neutral position and with the bear hugger blanket in place, PASO is in  place and all pressure points carefully inspected and padded. The patient  was prepped and  draped in the usual fashion for craniocervical approach.   A midline incision was made excising the previous incision and then stepwise  x-rays were done to prove the exact level that was exposed.   On the right side exposure was more complete and a towel clip was used to  grasp the spinous process of C5 and moving that showed some movement of the  C4-5 indicating the fusion was not complete at that level.   We did not get the bottom level, the T1 level on the lateral, so we will  just forget about it and we will get it later some time.   Starting out on the right side the titanium pin, which was lateral, was  divided with a carbide bit and the lateral screws at C4, C5, and C6 were  loosened and the hardware plus the lateral mask screws were removed from C4-  C6 on the right. The levels were carefully inspected and in C4 on the right  a 4x16 mm top loading Axon screw was inserted and then a 4x14 mm screw was  inserted in C5 and C6, leaving the C7 level open. Using a high speed drill a  laminotomy of C7-T1 was then performed on the right and then a nerve hook  was used to palpate and locate the  position of the pedicle and a hole was  drilled down the long axis of the pedicle angulating somewhat medially,  perhaps five degrees for a pedicle screw and a 3.5x16 mm screw was screwed  into the pedicle at C7 on that side. Standard lateral mask screw was then  inserted at C7 and the zygoapophyses joint at C4-5 and at C7-T1 was drilled  and the laminas of C4 and C5 medially were roughed up as was the lamina in  C7 and T1. A custom shaped titanium rod of 80 mm in length was then placed  from C4-T1 and all the screws were gently tightened down. At C5, the pedicle  screw top loading was sprung somewhat and the locking cap was then removed  and retorqued. On the left side exactly the same procedure was carried out  except at C5 the top loading screw had to be crimped a little bit with a  pair of pliers and a fresh cap was placed. The difference on the left side  was that at C4 a 14x4 mm screw was placed instead of a 16 mm screw.   The wound was irrigated with Betadine solution and hydrogen peroxide at that  point and then washed out with Bacitracin solution.   The spinous process at C5 was then harvested and macerated. Then bone marrow  mixed with the patient's own blood, mixed with the allomatrix along with the  patient's allograft and Vancomycin solution in a concentration of 100 mg of  Vancomycin per 25 cc, this required 5 or 6 cc to get the Allograft into the  right putty consistency. This was then packed in at 4-5 and at C6-T1  bilaterally, held in place with a large piece of Surgicel. Self-retaining  retractors were then removed and the dorsal neutral fascia was closed with 2-  0 Vicryl in interrupted fashion. Then 8 cc of Vancomycin solution was  squirted into the subfascial space with the expectation it will leak out  later. Then, the subcutaneous tissue was closed with 2-0 Vicryl inverted interrupted with 3 cc of Vancomycin solution squirted in subcutaneous. Skin  staples were used  in the skin. The patient  was turned into the recovery bed  and the Mayfield head clamp was removed. There was no significant bleeding,  so the patient was placed in an Aspen collar and discharged from the  operating room in good condition moving all four extremities.                                               Ricky D. Gasper Sells, M.D.    RDH/MEDQ  D:  02/11/2003  T:  02/11/2003  Job:  [id number]

## 2010-11-05 NOTE — Op Note (Signed)
   NAME:  Katelyn [name], Katelyn [name]                          ACCOUNT NO.:  [id number]   MEDICAL RECORD NO.:  [id number]                   PATIENT TYPE:  OIB   LOCATION:  NA                                   FACILITY:  [facility]   PHYSICIAN:  Ricky D. Gasper Sells, M.D.             DATE OF BIRTH:  05/22/1961   DATE OF PROCEDURE:  01/17/2003  DATE OF DISCHARGE:                                 OPERATIVE REPORT   PROCEDURE:  Fluoroscopic flexion-extension views of the neck including  oblique views and including patient digital localization of pain in the AP  projection.   In the presence of the radiologist, and with me being present, the patient  was placed in the fluoroscopic unit and a flexion-extension view of her neck  was obtained in the lateral view.  The patient had instrumentation  anteriorly in the cervical spine from C5-C7 and posteriorly in the cervical  spine from C2-C6.  These levels were clearly fused without any evidence of  abnormal motion.  Attention was then directed to the interspinous space at  C7-T1 as well as the facet appearance, especially in the oblique views at  those levels.  It was obvious that there was hypermobility or facet  diastasis of greater than 1.3 mm, actually probably greater than 2 mm, on  both the right and the left at the C7-T1 level, in other words it was  unstable.   The patient was then asked to localize her pain at point of maximal pain  using her index finger on the left and the right with a lead numeral taped  to it without the patient's knowledge of exactly where she was.   The patient spontaneously pointed to exactly the C7-T1 joint on both sides,  albeit on the right side a little bit more laterally.  This is strongly  indicative that that is where her pain is coming from.   Nevertheless, I am going to do an cervical block of her zygohypophyseal  joints in the operating room under fluoroscopic control to see whether that  blocks her pain.  In addition  we are going to get an MRI to make sure there  is no other pathology in the region, i.e., a ruptured disk.  This is not  something one would like to miss.  This will be done at the earliest  possible convenience.                                               Ricky D. Gasper Sells, M.D.    RDH/MEDQ  D:  01/17/2003  T:  01/17/2003  Job:  [id number]

## 2010-11-05 NOTE — Op Note (Signed)
. [facility]  Patient:    PEREL, HAUSCHILD Visit Number: [id number] MRN: [id number]          Service Type: DSU Location: 3000 3027 [id number] Attending Physician:  Evonnie Dawes Dictated by:   Donnal Debar Gasper Sells, M.D. Proc. Date: 02/26/01 Admit Date:  02/26/2001   CC:         Jearld Adjutant, M.D.  Clabe Seal. Meryl Crutch, M.D.   Operative Report  PREOPERATIVE DIAGNOSIS:  Mixed cervicogenic headache, left more so than right, and occipital neuralgia, left more so than right.  POSTOPERATIVE DIAGNOSIS:  Mixed cervicogenic headache, left moreso than right, and occipital neuralgia, left more so than right.  OPERATION:  C2, 3, 4, 5, 6 instrumentation and fusion on the right and C2, 3, 4, 5 instrumentation and fusion on the left using bone gel and using Synthes Cervi-Fix system with axis screws.  In addition, bilateral C2 microsurgical ganglionectomies and CO2 laser.  SURGEON:  Ricky D. Gasper Sells, M.D.  ASSISTANT:  Annie Sable, Montez Hageman., M.D.  ANESTHESIA:  General controlled.  COUNTS:  Instrument count, sponge count, and needle count correct.  COMPLICATIONS:  No CSF leaks.  No significant complications were encountered with the surgery.  INDICATIONS:  This is a 50 year old female with a long history of neck pain and headache.  She has been treated for her headaches by Dr. Meryl Crutch, but these became more unmanageable following a C5, 6, 7 anterior fusion and cervical plating six months ago.  Plating helped some of her neck pain but obviously put more stress on the levels above.  I told her preoperatively six months ago that the fusion may, in fact, make her headaches worse and certainly did not make them better.  They alternated from side to side occasionally and were associated with shooting pains up the back of the head. I did a block on her approximately a week ago and it revealed that I needed to block the left C4-5, C3-4, and C2-3 joints as  well as the left C2 root and ganglion to obviate and obliterate the headaches on the left side of her head that lasted approximately an hour.  These are very low volume blocks because of the dangerous area that they are in and they are for diagnostic purposes only and have never been associated, even when I used steroids with them 10 years ago, with any cures, that is, with any prolonged relief.  The possibility of surgical intervention was discussed with the patient.  She elected to give it a try, sick of taking all the medications that she has been on over these last many years.  A video flexion and extension done on her recently reveals no abnormal motion; however, there was straightening of the cervical spine at the level above that has limited motion secondary to neck pain.  The block was done on that same day and the results were as described. It was elected to tend to do a C2 through C6 instrumentation that is fusing her to the already fused C5-6 level.  This was accomplished on the right.  On the left, the lateral massive C6 was almost nonexistent secondary to the severe degenerative changes in the facet joints, so I could not get an effective screw into that lateral mass.  Bilateral C2 ganglionectomies were performed.  The left side was technically much more difficult.  The ganglion was enveloped in scar tissue as well as dense venous plexus between C1 and C2; nevertheless, I was  able to remove it in total.  Grossly, there was hypermobility on the right at C2-3, C3-4, and C4-5 of a moderate to severe nature on the left.  On the right, however, the neck was normal.  So, she had unilateral facet instability.  The patient tolerated the procedure well, awoke from surgery without her headache, was moving all four extremities, and was in stable shape.  DESCRIPTION OF OPERATION:  With the patient in the prone position in the Mayfield head clamp, she was positioned with the neck  slightly flexed to facilitate exposure and head was held in place with the Mayfield head clamp.  PAS hose and Bear Hugger blanket were then secured in place.  All pressure points were carefully inspected and padded including her forehead and her chin.  Patient was then prepped and draped in the usual fashion for a craniocervical approach to the upper cervical spine.  A midline incision was made from the inion to the spine of C2 and this was extended downward to facilitate appropriate exposure of the C6 level inferiorly.  Subcutaneous hemostasis was achieved with unipolar coagulation.  The musculoligamentous attachments were removed in the superior part of the spine of C2 leaving the inferior attachments intact as well as removed from the lamina of C2 and unipolar coagulation was used to dissect free the arch of C1.  Then, digital dissection was used to expose the C1-2 level and a self-retaining retractor was inserted.  A plane was developed between the nuchal muscles and the musculoligamentous attachments to the spine of C2 in a fairly hemostatically perfect plane.  The dorsal and lateral aspects of the C2-3, C3-4, C4-5, and C5-6 joints had been exposed and the third, fourth, and fifth occipital nerves were divided under direct inspection (the nerves supplying the C2-3, C3-4, and C4-5 joints in that order).  This was done bilaterally.  A Scoville retractor was then brought in and, then, the microscope was brought in, and the CO2 laser was set at 3 watts, slightly defocused 200 millisecond pulse with multipulse mode; and starting on the right, the C2 root was divided laterally, retracted superiorly where the ventral ramus was divided and, then, retracted laterally and using Surgicel to dissect the arachnoid invaginations away from the dorsal root, these were divided with the CO2 laser and the C2 ganglion was removed in that fashion.   Exactly the same procedure was carried out on the  left side with the exception that there was more bleeding and more adhesions.  Valsalvas at 30+ cm of water were performed bilaterally and there was no CSF leak.  Attention was then directed toward the fusion.  Microscope was removed.  A high speed drill was used to drill out the facet joints on the more stable side of C2, C3, C4, and C4-5.  This was about 3 to 4 mm deep dorsally and laterally down to cancellous bone.  An awl was then used to score the mid point of the pedicle of C2 on the right as well as the mid point of the lateral mass of C3, C4, C5, And C6.  A drill was then used to drill a 14 mm hole in each one of these structures going along the axis of the C2 pedicle on the right and, then, angling superiorly along the line of the facet and, then, laterally to avoid the vertebral artery and roots at C3, C4, C5, C6 on the right.  The screw tap was then used to tap the holes and,  when that was done, a Cervi-Fix titanium 80 mm rod was bent medially at the C2 level and then to approximate a cervical lordosis inferior to that, so with two curvatures.  It was then secured in place with a 16 mm screw at C2 and 14 mm screws at C3, C4, C5, and C6.  The whole apparatus was then tightened down.  There was a secure fixation.  Exactly the same procedure was carried out on the left.  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  I could not get a secure screw fixation there even though I did get a hole, but there just was not enough bone to hold it.  Nevertheless, the C5-6 level had already been fused and we had a secure fixation on the left to make the fusion construct somewhat more stable.  Some graft-like bone paste, that is, DBX bone putty, 10 cc was then mixed with a few cc of the vancomycin solution to keep antibiotic in the bone paste and, then, this  was packed into the facet joints bilaterally and held in place with Surgicel.  It should be noted the wound had been irrigated numerous times throughout the procedure with hydrogen peroxide, Betadine solution, and Bacitracin solution.  Self-retaining retractors were then removed and paraspinal hemostasis was achieved with unipolar coagulation where necessary.  The dorsal nuchal fascia was then closed with 2-0 Vicryl in an interrupted fashion.  Vancomycin solution, 5 cc, was left in the subfascial plane proximally.  Then, the skin was closed with inverted interrupted sutures in the fascial layer and, then, some vancomycin, a couple cc, was left in the subcutaneous plane.  Then, skin staples were used in the skin having anesthetized the skin edges with a total of 10 cc of 0.5% bupivacaine without epinephrine.  Betadine ointment and dry dressing was applied.  The patient was turned into her bed and the Mayfield head clamp was removed.  She rapidly began moving her arms and legs, and was extubated in good condition in the operating room. Dictated by:   Donnal Debar Gasper Sells, M.D. Attending Physician:  Evonnie Dawes DD:  02/26/01 TD:  02/26/01 Job: [id number]

## 2010-11-05 NOTE — Discharge Summary (Signed)
NAME:  Katelyn [name], Katelyn [name]                          ACCOUNT NO.:  [id number]   MEDICAL RECORD NO.:  [id number]                   PATIENT TYPE:  INP   LOCATION:  3004                                 FACILITY:  [facility]   PHYSICIAN:  Ricky D. Gasper Sells, M.D.             DATE OF BIRTH:  October 16, 1960   DATE OF ADMISSION:  02/11/2003  DATE OF DISCHARGE:  02/12/2003                                 DISCHARGE SUMMARY   ADMISSION DIAGNOSES:  1. Status post remote anterior C5-C7 fusion status post remote posterior C2-     C6 instrumented fusion.  2. Instability, C7-T1, well documented by fluoroscopic flexion and extension     views and blocks.  3. Intraoperative instability of C4-5 as evidenced by examining hardware and     the abnormal motion at that level, in addition to the already suspected     C7-T1 instability.   OPERATIVE PROCEDURE:  1. On August 24, exposure of posterior hardware and evaluation for     stability.  2. Bilateral C7-T1 laminectomies.  3. Removal of previous hardware, C4 through C6.  4. Harvesting of autografts and spinous process from C5 as well as bone     marrow and blood from same incision.  5. Fusion, C4 to T1, with mixed autograft, allograft (Allomatrix), the     patient's own blood, bone marrow, and autograft as well as vancomycin.  6. Fixation using Synthes Axon system with lateral mass screws at C4, C5,     C6, and C7; pedicle screws at T1.   SURGEON:  Ricky D. Gasper Sells, M.D.   ASSISTANT:  Izell Muscogee. Deaton, M.D.   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 She pointed to the base of  her spine with her fingers as her place of maximal pain. Fluoroscopic flexin  and extensive views showed clear diathesis of the C7-T1 facet joints. This  was clearly visible. She localized her pain using lead ball on the end of  her finger exactly to that area.   When I blocked that area under fluoroscopic control, this got rid of the  majority of her pain in the base of her neck. Based on this, we decided to  extend her fusion down one level to C7-T1.   When her hardware was localized intraoperatively, we found that the C4-5  level was also loose.   The rods were cut between C3 and C4. The hardware at C4, C5, and C6 was  removed bilaterally, and then lateral mass screws were placed in C4, C5, C6,  and C7 bilaterally, and a pedicle screw was placed in T1 after doing a  laminectomy of C7-T1 to localize the pedicle. No intraoperative  complications occurred. There was no  CSF leak. There was certainly no  vigorous bleeding. One might see if one inadvertently struck the vertebral  artery which is a definite risk in all of these sorts of procedures. The  patient awoke from surgery without her usual neck pain that she had before  surgery but with quite a bit of incisional pain. We managed to achieve oral  medication pain relief before she was discharged. She was given one week of  OxyContin 20 mg p.o. b.i.d. with Lorcet 10 q.i.d. for breakthrough pain. She  will be kept on Cipro 500 mg p.o. b.i.d. for a month. She is to come back to  my office in one week's time for ultimate staple removal.   CONDITION ON DISCHARGE:  Markedly improved.   DISPOSITION:  Home.   DISCHARGE DIAGNOSIS:  Status post C4 through T1 posterior instrument and  fusion.                                                Ricky D. Gasper Sells, M.D.    RDH/MEDQ  D:  02/12/2003  T:  02/13/2003  Job:  [id number]

## 2010-11-05 NOTE — Discharge Summary (Signed)
[place]  Patient:    Katelyn [name], Katelyn [name] Visit Number: [id number] MRN: [id number]          Service Type: DSU Location: [id number] Attending Physician:  Evonnie Dawes Dictated by:   Donnal Debar Gasper Sells, M.D. Admit Date:  02/26/2001 Discharge Date: 02/27/2001   CC:         Dr. Lesli Albee U. Meryl Crutch, M.D.   Discharge Summary  PREOPERATIVE DIAGNOSES: 1. Bilateral occipital neuralgia left greater than right and bilateral    cervicogenic headache with instability, left much more so than right, C2-3,    C3-4, C4-5. 2. Status post remote anterior C5 through C7 fusion.  DISCHARGE DIAGNOSES: 1. Bilateral occipital neuralgia left greater than right and bilateral    cervicogenic headache with instability, left much more so than right, C2-3,    C3-4, C4-5. 2. Status post remote anterior C5 through C7 fusion. 3. Status post bilateral C2, 3, 4, 5, fusion and fixation C2 through C6 right    and C2 through C5 left. 4. Bilateral C2 ganglionectomy.  SURGEON:  Ricky D. Gasper Sells, M.D.  ASSISTANT:  Annie Sable, Montez Hageman., M.D.  ANESTHESIA:  General controlled.  COUNTS:  Sponge, needle and instrument counts were correct.  ESTIMATED BLOOD LOSS:  350 cc.  SUMMARY:  This is a 50 year old female with long history of medically refractory headache.  She had been managed medically by Clabe Seal. Meryl Crutch, M.D., and basically was out of control.  I examined her and found that she probably had cervicogenic headache with component of occipital neuralgia because of the lancinating component of the pain.  This alternated from side to side and was more prominent on the left than the right.  We did video flexion-extension views which showed limitation of motion but no obvious instability in the upper cervical spine. A block done the same day, however, on the left at C4-5, C3-4, and C2-3 in that ordered followed by a C2 root and ganglion block completely  relieved her headache. This is temporary and that is due to the low volume as this diagnostic procedure not meant to be therapeutic.  Nevertheless, we were able to establish the fact that the headache could be blocked and this is presumed evidence of instability which was confirmed intraoperatively.  She was moderately to severely unstable at the C2-3 and C3-4 levels and to a lesser degree at C4-5. The C3 vertebral body was very loose on the left.  Interestingly in the right, it was fairly stable. There were marked degenerative changes at the levels below, that is C5-6 and C6-7, more so on the left than the right, altering the anatomy because of the severe facet hypertrophy.  She was taken to the operating room and an uncomplicated approach was made to the C3 through C6 levels bilaterally.  I was able to identify the C2 nerve roots bilaterally.  These were removed.  It was more difficult on the left because of increased scar tissue.  Instability was noted at C2-3, C3-4, and C4-5.  The C5-6 level was securely fused.  On the right I secured using the Cervifix system C5 through C6, having drilled out the C2-3, C3-4, and C4-5 facet joints 3 to 4 mm dorsally and 3 to 4 mm laterally.  The same procedure was carried out on the left side; however, I could not secure a screw in the C6 lateral mass because of the altered anatomy due to degenerative changes.  In other words, the  lateral mass was somewhat scalloped due to the hypertrophy of the facet joint.  This is described anatomical feature of degenerative disc disease.  The patient tolerated the procedure well. She awoke from surgery unremarkably. Her vital signs remained stable throughout her admission except for some mild hypotension probably related to the injectable pain medication she was on, that is the Demerol. She was very sensitive to codeine. She could not tolerate OxyContin. The only bump on her admission at all was the fact that  her preoperative EKG showed soft signs of a septal infarct. This was gone over with Dr. Bedelia Person, M.D., the anesthesiologist, and myself.  I did not see significant changes between that and her previous EKG which had been called normal and neither did he.  She was asymptomatic cardiacwise anyway, no chest pain, no shortness of breath.  We passed that preoperatively.  Four hours postoperatively she was up and walked over 600 feet on her own without difficulty.  She did suffer from some nausea throughout her admission secondary to her sensitivity to narcotics.  For this reason, she was given Phenergan suppositories 50 mg p.r. q.4h. p.r.n.  She was sent home on a tapering dose of Lorcet 10 and this will be modified as required on an outpatient basis.  CONDITION ON DISCHARGE:  Markedly improved.  DISPOSITION:  Home.  DISCHARGE DIAGNOSIS:  Status post C2-C5 fusion with C2-C6 fixation on the right and C2-C5 fixation on the left.  Bilateral C2 ganglionectomy. Dictated by:   Donnal Debar Gasper Sells, M.D. Attending Physician:  Evonnie Dawes DD:  02/27/01 TD:  02/27/01 Job: [id number]

## 2010-12-13 ENCOUNTER — Ambulatory Visit: Payer: Self-pay | Admitting: Pain Medicine

## 2010-12-20 ENCOUNTER — Ambulatory Visit: Payer: Self-pay | Admitting: Pain Medicine

## 2011-01-20 ENCOUNTER — Ambulatory Visit: Payer: Self-pay | Admitting: Pain Medicine

## 2011-01-26 ENCOUNTER — Ambulatory Visit: Payer: Self-pay | Admitting: Pain Medicine

## 2011-02-16 ENCOUNTER — Ambulatory Visit: Payer: Self-pay | Admitting: Pain Medicine

## 2011-03-16 LAB — CBC
HCT: 37.4
HCT: 39.2
Hemoglobin: 12.7
Hemoglobin: 13.7
MCHC: 35
MCV: 89.4
MCV: 89.8
Platelets: 241
RBC: 4.39
RDW: 12.5
RDW: 12.6
WBC: 6.3

## 2011-03-16 LAB — DIFFERENTIAL
Basophils Absolute: 0
Basophils Absolute: 0.1
Basophils Relative: 1
Eosinophils Absolute: 0
Eosinophils Absolute: 0
Eosinophils Relative: 0
Eosinophils Relative: 0
Lymphocytes Relative: 31
Lymphocytes Relative: 36
Lymphs Abs: 2
Lymphs Abs: 2.3
Monocytes Absolute: 0.4
Monocytes Absolute: 0.6
Monocytes Relative: 6
Neutro Abs: 3.8
Neutrophils Relative %: 61

## 2011-03-16 LAB — URINALYSIS, ROUTINE W REFLEX MICROSCOPIC
Bilirubin Urine: NEGATIVE
Glucose, UA: NEGATIVE
Ketones, ur: NEGATIVE
Leukocytes, UA: NEGATIVE
Nitrite: NEGATIVE
Protein, ur: NEGATIVE
Specific Gravity, Urine: 1.009
Urobilinogen, UA: 0.2
pH: 7

## 2011-03-16 LAB — URINE MICROSCOPIC-ADD ON

## 2011-03-16 LAB — BASIC METABOLIC PANEL
CO2: 29
Chloride: 101
GFR calc non Af Amer: >60
Glucose, Bld: 117 — ABNORMAL HIGH
Potassium: 3.3 — ABNORMAL LOW
Sodium: 136

## 2011-03-16 LAB — T4, FREE: Free T4: 1.6

## 2011-03-16 LAB — COMPREHENSIVE METABOLIC PANEL
ALT: 24
AST: 19
Albumin: 3.7
Alkaline Phosphatase: 51
BUN: 5 — ABNORMAL LOW
CO2: 29
Calcium: 9.2
Chloride: 102
Creatinine, Ser: 0.61
GFR calc Af Amer: >60
GFR calc non Af Amer: >60
Glucose, Bld: 92
Potassium: 3.8
Sodium: 137
Total Bilirubin: 0.5
Total Protein: 6.5

## 2011-03-16 LAB — PHENOBARBITAL LEVEL: Phenobarbital: <5 — ABNORMAL LOW

## 2011-03-16 LAB — PROTIME-INR
INR: 0.9
Prothrombin Time: 12.3

## 2011-03-16 LAB — LIPID PANEL
Cholesterol: 136
LDL Cholesterol: 82
Total CHOL/HDL Ratio: 3.5

## 2011-03-16 LAB — RAPID URINE DRUG SCREEN, HOSP PERFORMED
Amphetamines: NOT DETECTED
Barbiturates: NOT DETECTED
Benzodiazepines: POSITIVE — AB
Cocaine: NOT DETECTED
Opiates: POSITIVE — AB
Tetrahydrocannabinol: NOT DETECTED

## 2011-03-16 LAB — ETHANOL: Alcohol, Ethyl (B): <5

## 2011-03-16 LAB — TSH: TSH: 0.569

## 2012-10-09 ENCOUNTER — Ambulatory Visit: Payer: Self-pay | Admitting: Emergency Medicine

## 2012-10-09 LAB — BASIC METABOLIC PANEL
Anion Gap: 4 — ABNORMAL LOW (ref 7–16)
BUN: 6 mg/dL — ABNORMAL LOW (ref 7–18)
Co2: 30 mmol/L (ref 21–32)
Creatinine: 0.64 mg/dL (ref 0.60–1.30)
EGFR (Non-African Amer.): >60
Glucose: 84 mg/dL (ref 65–99)
Potassium: 4.1 mmol/L (ref 3.5–5.1)
Sodium: 132 mmol/L — ABNORMAL LOW (ref 136–145)

## 2012-10-09 LAB — CBC WITH DIFFERENTIAL/PLATELET
Eosinophil #: 0 10*3/uL (ref 0.0–0.7)
Eosinophil %: 0.3 %
MCH: 31.2 pg (ref 26.0–34.0)
Monocyte #: 0.4 x10 3/mm (ref 0.2–0.9)
Neutrophil #: 3.7 10*3/uL (ref 1.4–6.5)
Platelet: 189 10*3/uL (ref 150–440)
RBC: 4.55 10*6/uL (ref 3.80–5.20)
RDW: 12.7 % (ref 11.5–14.5)

## 2012-10-11 ENCOUNTER — Ambulatory Visit: Payer: Self-pay | Admitting: Emergency Medicine

## 2014-01-26 ENCOUNTER — Emergency Department (HOSPITAL_COMMUNITY): Payer: Medicaid Other

## 2014-01-26 ENCOUNTER — Encounter (HOSPITAL_COMMUNITY): Payer: Self-pay | Admitting: Emergency Medicine

## 2014-01-26 ENCOUNTER — Observation Stay (HOSPITAL_COMMUNITY)
Admission: EM | Admit: 2014-01-26 | Discharge: 2014-01-27 | Disposition: A | Discharge status: Home / Self Care | Payer: Medicaid Other | Attending: Internal Medicine | Admitting: Internal Medicine

## 2014-01-26 DIAGNOSIS — Z79899 Other long term (current) drug therapy: Secondary | ICD-10-CM | POA: Diagnosis not present

## 2014-01-26 DIAGNOSIS — J441 Chronic obstructive pulmonary disease with (acute) exacerbation: Secondary | ICD-10-CM | POA: Insufficient documentation

## 2014-01-26 DIAGNOSIS — R0789 Other chest pain: Secondary | ICD-10-CM | POA: Diagnosis not present

## 2014-01-26 DIAGNOSIS — R079 Chest pain, unspecified: Secondary | ICD-10-CM | POA: Diagnosis present

## 2014-01-26 DIAGNOSIS — M79609 Pain in unspecified limb: Secondary | ICD-10-CM | POA: Diagnosis not present

## 2014-01-26 DIAGNOSIS — F17200 Nicotine dependence, unspecified, uncomplicated: Secondary | ICD-10-CM | POA: Insufficient documentation

## 2014-01-26 DIAGNOSIS — M542 Cervicalgia: Secondary | ICD-10-CM | POA: Diagnosis not present

## 2014-01-26 DIAGNOSIS — R072 Precordial pain: Secondary | ICD-10-CM

## 2014-01-26 DIAGNOSIS — E785 Hyperlipidemia, unspecified: Secondary | ICD-10-CM | POA: Diagnosis present

## 2014-01-26 DIAGNOSIS — I10 Essential (primary) hypertension: Secondary | ICD-10-CM | POA: Diagnosis present

## 2014-01-26 HISTORY — DX: Reserved for inherently not codable concepts without codable children: IMO0001

## 2014-01-26 HISTORY — DX: Essential (primary) hypertension: I10

## 2014-01-26 HISTORY — DX: Gastro-esophageal reflux disease without esophagitis: K21.9

## 2014-01-26 HISTORY — DX: Nicotine dependence, unspecified, uncomplicated: F17.200

## 2014-01-26 HISTORY — DX: Chronic obstructive pulmonary disease, unspecified: J44.9

## 2014-01-26 HISTORY — DX: Diaphragmatic hernia without obstruction or gangrene: K44.9

## 2014-01-26 HISTORY — DX: Hyperlipidemia, unspecified: E78.5

## 2014-01-26 LAB — BASIC METABOLIC PANEL
ANION GAP: 15 (ref 5–15)
BUN: 4 mg/dL — ABNORMAL LOW (ref 6–23)
CALCIUM: 9.4 mg/dL (ref 8.4–10.5)
CO2: 25 mEq/L (ref 19–32)
Chloride: 90 mEq/L — ABNORMAL LOW (ref 96–112)
Creatinine, Ser: 0.59 mg/dL (ref 0.50–1.10)
GLUCOSE: 94 mg/dL (ref 70–99)
POTASSIUM: 3.6 meq/L — AB (ref 3.7–5.3)
SODIUM: 130 meq/L — AB (ref 137–147)

## 2014-01-26 LAB — PRO B NATRIURETIC PEPTIDE: PRO B NATRI PEPTIDE: 230 pg/mL — AB (ref 0–125)

## 2014-01-26 LAB — CBC
HCT: 38 % (ref 36.0–46.0)
HEMOGLOBIN: 13.9 g/dL (ref 12.0–15.0)
MCH: 31.2 pg (ref 26.0–34.0)
MCHC: 36.6 g/dL — AB (ref 30.0–36.0)
MCV: 85.2 fL (ref 78.0–100.0)
PLATELETS: 199 10*3/uL (ref 150–400)
RBC: 4.46 MIL/uL (ref 3.87–5.11)
RDW: 11.8 % (ref 11.5–15.5)
WBC: 6.2 10*3/uL (ref 4.0–10.5)

## 2014-01-26 LAB — I-STAT TROPONIN, ED: TROPONIN I, POC: 0 ng/mL (ref 0.00–0.08)

## 2014-01-26 MED ORDER — IPRATROPIUM BROMIDE 0.02 % IN SOLN
0.5000 mg | Freq: Once | RESPIRATORY_TRACT | Status: AC
  Administered 2014-01-27: 0.5 mg via RESPIRATORY_TRACT
  Filled 2014-01-26: qty 2.5

## 2014-01-26 MED ORDER — NITROGLYCERIN 2 % TD OINT
1.0000 [in_us] | TOPICAL_OINTMENT | Freq: Once | TRANSDERMAL | Status: AC
Start: 2014-01-27 — End: 2014-01-27
  Administered 2014-01-27: 1 [in_us] via TOPICAL
  Filled 2014-01-26: qty 1

## 2014-01-26 MED ORDER — ALBUTEROL SULFATE (2.5 MG/3ML) 0.083% IN NEBU
5.0000 mg | INHALATION_SOLUTION | Freq: Once | RESPIRATORY_TRACT | Status: AC
  Administered 2014-01-27: 5 mg via RESPIRATORY_TRACT
  Filled 2014-01-26: qty 6

## 2014-01-26 MED ORDER — ASPIRIN 81 MG PO CHEW
324.0000 mg | CHEWABLE_TABLET | Freq: Once | ORAL | Status: AC
  Administered 2014-01-27: 324 mg via ORAL
  Filled 2014-01-26: qty 4

## 2014-01-26 MED ORDER — MORPHINE SULFATE 2 MG/ML IJ SOLN
2.0000 mg | Freq: Once | INTRAMUSCULAR | Status: AC
  Administered 2014-01-26: 2 mg via INTRAVENOUS
  Filled 2014-01-26: qty 1

## 2014-01-26 MED ORDER — METHYLPREDNISOLONE SODIUM SUCC 125 MG IJ SOLR
125.0000 mg | Freq: Once | INTRAMUSCULAR | Status: AC
  Administered 2014-01-26: 125 mg via INTRAVENOUS
  Filled 2014-01-26: qty 2

## 2014-01-26 MED ORDER — SODIUM CHLORIDE 0.9 % IV BOLUS (SEPSIS)
1000.0000 mL | Freq: Once | INTRAVENOUS | Status: AC
  Administered 2014-01-26: 1000 mL via INTRAVENOUS

## 2014-01-26 NOTE — ED Provider Notes (Signed)
CSN: [id number]     Arrival date & time 01/26/14  2101 History   First MD Initiated Contact with Patient 01/26/14 2214     Chief Complaint  Patient presents with  . Chest Pain     (Consider location/radiation/quality/duration/timing/severity/associated sxs/prior Treatment) The history is provided by the patient and medical records. No language interpreter was used.    Katelyn [name] is a 53 y.o. female  with a hx of COPD, HTN, cholecystectomy, partial hyrestectomy presents to the Emergency Department complaining of gradual, persistent, progressively worsening chest pain with associated SOB, left neck pain and left arm pain onset 3 days ago.  Pt denies taking any medications for this.  She has been taking spiriva and advair without relief.  Nothing makes it better and breathing walking makes the SOB and CP worse.  Pt denies fever, chills, headache, abd pain, N/V/D, weakness, syncope, dysuria, hematuria.  Pt reports an episode of the same pain several years ago and was d/c with bronchitis.    PCP: Sherril lyndsay - [facility] No cardiologist.     Past Medical History  Diagnosis Date  . COPD (chronic obstructive pulmonary disease)   . Hypertension    Past Surgical History  Procedure Laterality Date  . Cholecystectomy    . Hernia repair    . Abdominal hysterectomy    . Tonsillectomy    . Adenoidectomy    . Neck surgery     History reviewed. No pertinent family history. History  Substance Use Topics  . Smoking status: Current Every Day Smoker  . Smokeless tobacco: Not on file  . Alcohol Use: No   OB History   Grav Para Term Preterm Abortions TAB SAB Ect Mult Living                 Review of Systems  Constitutional: Negative for fever, diaphoresis, appetite change, fatigue and unexpected weight change.  HENT: Negative for mouth sores.   Eyes: Negative for visual disturbance.  Respiratory: Positive for shortness of breath. Negative for cough, chest tightness  and wheezing.   Cardiovascular: Positive for chest pain.  Gastrointestinal: Negative for nausea, vomiting, abdominal pain, diarrhea and constipation.  Endocrine: Negative for polydipsia, polyphagia and polyuria.  Genitourinary: Negative for dysuria, urgency, frequency and hematuria.  Musculoskeletal: Negative for back pain and neck stiffness.  Skin: Negative for rash.  Allergic/Immunologic: Negative for immunocompromised state.  Neurological: Negative for syncope, light-headedness and headaches.  Hematological: Does not bruise/bleed easily.  Psychiatric/Behavioral: Negative for sleep disturbance. The patient is not nervous/anxious.       Allergies  Ciprofloxacin; Macrobid; and Sulfa antibiotics  Home Medications   Prior to Admission medications   Medication Sig Start Date End Date Taking? Authorizing Provider  ALPRAZolam Prudy Feeler) 1 MG tablet Take 1 mg by mouth 3 (three) times daily.   Yes Historical Provider, MD  amLODipine (NORVASC) 5 MG tablet Take 5 mg by mouth daily.   Yes Historical Provider, MD  benazepril-hydrochlorthiazide (LOTENSIN HCT) 20-25 MG per tablet Take 1 tablet by mouth daily.   Yes Historical Provider, MD  carisoprodol (SOMA) 350 MG tablet Take 350 mg by mouth 3 (three) times daily.   Yes Historical Provider, MD  Cholecalciferol (VITAMIN D3) 5000 UNITS CAPS Take 1 capsule by mouth See admin instructions. Take on Monday, Wednesday, and Friday   Yes Historical Provider, MD  diazepam (VALIUM) 10 MG tablet Take 10 mg by mouth every 8 (eight) hours.   Yes Historical Provider, MD  esomeprazole (NEXIUM)  40 MG capsule Take 40 mg by mouth daily at 12 noon.   Yes Historical Provider, MD  Linaclotide (LINZESS) 290 MCG CAPS capsule Take 290 mcg by mouth daily.   Yes Historical Provider, MD  saccharomyces boulardii (FLORASTOR) 250 MG capsule Take 250 mg by mouth daily.   Yes Historical Provider, MD  simvastatin (ZOCOR) 40 MG tablet Take 40 mg by mouth every evening.   Yes  Historical Provider, MD  SUMAtriptan (IMITREX) 100 MG tablet Take 100 mg by mouth every 2 (two) hours as needed for migraine. May repeat in 2 hours if headache persists or recurs.   Yes Historical Provider, MD  tiotropium (SPIRIVA) 18 MCG inhalation capsule Place 18 mcg into inhaler and inhale daily.   Yes Historical Provider, MD   BP 133/89  Pulse 68  Temp(Src) 97.5 F (36.4 C) (Oral)  Resp 24  Ht 5\' 4"  (1.626 m)  Wt 130 lb (58.968 kg)  BMI 22.30 kg/m2  SpO2 100% Physical Exam  Nursing note and vitals reviewed. Constitutional: She is oriented to person, place, and time. She appears well-developed and well-nourished. No distress.  Awake, alert, nontoxic appearance  HENT:  Head: Normocephalic and atraumatic.  Mouth/Throat: Oropharynx is clear and moist. No oropharyngeal exudate.  Eyes: Conjunctivae are normal. No scleral icterus.  Neck: Normal range of motion. Neck supple.  Cardiovascular: Normal rate, regular rhythm, normal heart sounds and intact distal pulses.   Pulmonary/Chest: Effort normal. No respiratory distress. She has wheezes (scattered, mild).  Equal chest expansion  Abdominal: Soft. Bowel sounds are normal. She exhibits no mass. There is no tenderness. There is no rebound and no guarding.  Musculoskeletal: Normal range of motion. She exhibits no edema.  Neurological: She is alert and oriented to person, place, and time. She exhibits normal muscle tone. Coordination normal.  Speech is clear and goal oriented Moves extremities without ataxia  Skin: Skin is warm and dry. She is not diaphoretic. No erythema.  Psychiatric: She has a normal mood and affect.    ED Course  Procedures (including critical care time) Labs Review Labs Reviewed  CBC - Abnormal; Notable for the following:    MCHC 36.6 (*)    All other components within normal limits  BASIC METABOLIC PANEL - Abnormal; Notable for the following:    Sodium 130 (*)    Potassium 3.6 (*)    Chloride 90 (*)    BUN  4 (*)    All other components within normal limits  PRO B NATRIURETIC PEPTIDE - Abnormal; Notable for the following:    Pro B Natriuretic peptide (BNP) 230.0 (*)    All other components within normal limits  I-STAT TROPOININ, ED    Imaging Review Dg Chest 2 View  01/26/2014   CLINICAL DATA:  Midsternal chest pain, radiating to the left side. Shortness of breath. History of smoking.  EXAM: CHEST  2 VIEW  COMPARISON:  CTA of the chest performed 10/09/2007  FINDINGS: The lungs are well-aerated and clear. There is no evidence of focal opacification, pleural effusion or pneumothorax.  The heart is normal in size; the mediastinal contour is within normal limits. No acute osseous abnormalities are seen. Cervical spinal fusion hardware is partially imaged. Clips are noted within the right upper quadrant, reflecting prior cholecystectomy.  IMPRESSION: No acute cardiopulmonary process seen.   Electronically Signed   By: Roanna RaiderJeffery  Chang M.D.   On: 01/26/2014 22:43     EKG Interpretation   Date/Time:  Sunday January 26 2014 21:06:59  EDT Ventricular Rate:  78 PR Interval:  158 QRS Duration: 82 QT Interval:  390 QTC Calculation: 444 R Axis:   63 Text Interpretation:  Normal sinus rhythm Right atrial enlargement  Borderline ECG Confirmed by DOCHERTY  MD, MEGAN ([id number]) on 01/26/2014  10:56:09 PM      MDM   Final diagnoses:  Chronic obstructive pulmonary disease with acute exacerbation  Chest pain, unspecified chest pain type    [name] presents with substernal CP radiating to the left arm and neck.  Pt with hx of COPD and c/o of associated SOB.  Will give nitro, repeat troponin, and give breathing treatment.   12:29 AM Pt without improvement in pain at this time.  Pain medication redosed.  Labs reassuring.  ECG nonischemic.  Pt reports albuterol did not help.  Clear and equal breath sounds; no persistent wheezes.  Steroids given.    1:32 AM  Pt continues with pain and SOB.  No  tachycardia, no hypoxia.  Discussed with Lyda Perone, DO who accepts the admission for [facility].    BP 133/89  Pulse 68  Temp(Src) 97.5 F (36.4 C) (Oral)  Resp 24  Ht 5\' 4"  (1.626 m)  Wt 130 lb (58.968 kg)  BMI 22.30 kg/m2  SpO2 100%   The patient was discussed with and seen by Dr. Micheline Maze who agrees with the treatment plan.   Dahlia Client Kami Kube, PA-C 01/27/14 0134

## 2014-01-26 NOTE — ED Notes (Signed)
Pt presents to ED with mid-sternum chest pain radiates to Left chest up into her Left lateral neck, SOB, Left arm, posterior neck and heart "beating real fast" starting yesterday that worsened today.

## 2014-01-27 ENCOUNTER — Encounter (HOSPITAL_COMMUNITY): Payer: Self-pay | Admitting: Internal Medicine

## 2014-01-27 DIAGNOSIS — I369 Nonrheumatic tricuspid valve disorder, unspecified: Secondary | ICD-10-CM

## 2014-01-27 DIAGNOSIS — I10 Essential (primary) hypertension: Secondary | ICD-10-CM | POA: Diagnosis present

## 2014-01-27 DIAGNOSIS — E785 Hyperlipidemia, unspecified: Secondary | ICD-10-CM | POA: Diagnosis present

## 2014-01-27 DIAGNOSIS — J441 Chronic obstructive pulmonary disease with (acute) exacerbation: Secondary | ICD-10-CM

## 2014-01-27 DIAGNOSIS — R079 Chest pain, unspecified: Secondary | ICD-10-CM | POA: Diagnosis present

## 2014-01-27 DIAGNOSIS — R072 Precordial pain: Secondary | ICD-10-CM

## 2014-01-27 LAB — TROPONIN I: Troponin I: <0.3 ng/mL (ref <0.30)

## 2014-01-27 LAB — CBC
HCT: 37 % (ref 36.0–46.0)
HEMOGLOBIN: 12.8 g/dL (ref 12.0–15.0)
MCH: 29.8 pg (ref 26.0–34.0)
MCHC: 34.6 g/dL (ref 30.0–36.0)
MCV: 86.2 fL (ref 78.0–100.0)
Platelets: 186 10*3/uL (ref 150–400)
RBC: 4.29 MIL/uL (ref 3.87–5.11)
RDW: 12.1 % (ref 11.5–15.5)
WBC: 5 10*3/uL (ref 4.0–10.5)

## 2014-01-27 MED ORDER — MORPHINE SULFATE 2 MG/ML IJ SOLN
2.0000 mg | INTRAMUSCULAR | Status: DC | PRN
  Administered 2014-01-27 (×3): 4 mg via INTRAVENOUS
  Filled 2014-01-27 (×3): qty 2

## 2014-01-27 MED ORDER — CARISOPRODOL 350 MG PO TABS
350.0000 mg | ORAL_TABLET | Freq: Three times a day (TID) | ORAL | Status: DC
  Administered 2014-01-27 (×2): 350 mg via ORAL
  Filled 2014-01-27 (×2): qty 1

## 2014-01-27 MED ORDER — HEPARIN BOLUS VIA INFUSION
3500.0000 [IU] | Freq: Once | INTRAVENOUS | Status: AC
  Administered 2014-01-27: 3500 [IU] via INTRAVENOUS
  Filled 2014-01-27: qty 3500

## 2014-01-27 MED ORDER — MORPHINE SULFATE 2 MG/ML IJ SOLN
2.0000 mg | Freq: Once | INTRAMUSCULAR | Status: AC
  Administered 2014-01-27: 2 mg via INTRAVENOUS
  Filled 2014-01-27: qty 1

## 2014-01-27 MED ORDER — ASPIRIN 325 MG PO TABS
325.0000 mg | ORAL_TABLET | Freq: Every day | ORAL | Status: DC
Start: 2014-01-27 — End: 2014-01-27
  Administered 2014-01-27: 325 mg via ORAL
  Filled 2014-01-27: qty 1

## 2014-01-27 MED ORDER — ONDANSETRON HCL 4 MG/2ML IJ SOLN
4.0000 mg | Freq: Four times a day (QID) | INTRAMUSCULAR | Status: DC | PRN
  Administered 2014-01-27: 4 mg via INTRAVENOUS
  Filled 2014-01-27: qty 2

## 2014-01-27 MED ORDER — ACETAMINOPHEN 325 MG PO TABS: 650.0000 mg | ORAL_TABLET | ORAL | Status: DC | PRN

## 2014-01-27 MED ORDER — ALBUTEROL SULFATE HFA 108 (90 BASE) MCG/ACT IN AERS: 2.0000 | INHALATION_SPRAY | Freq: Four times a day (QID) | RESPIRATORY_TRACT | Status: DC | PRN

## 2014-01-27 MED ORDER — SIMVASTATIN 40 MG PO TABS
40.0000 mg | ORAL_TABLET | Freq: Every evening | ORAL | Status: DC
  Filled 2014-01-27: qty 1

## 2014-01-27 MED ORDER — ALBUTEROL SULFATE (2.5 MG/3ML) 0.083% IN NEBU: 2.5000 mg | INHALATION_SOLUTION | Freq: Four times a day (QID) | RESPIRATORY_TRACT | Status: DC | PRN

## 2014-01-27 MED ORDER — ALPRAZOLAM 1 MG PO TABS: 1.0000 mg | ORAL_TABLET | Freq: Three times a day (TID) | ORAL | Status: DC

## 2014-01-27 MED ORDER — SIMVASTATIN 40 MG PO TABS
40.0000 mg | ORAL_TABLET | Freq: Every evening | ORAL | Status: DC
Start: 2014-01-27 — End: 2015-01-07

## 2014-01-27 MED ORDER — TIOTROPIUM BROMIDE MONOHYDRATE 18 MCG IN CAPS: 18.0000 ug | ORAL_CAPSULE | Freq: Every day | RESPIRATORY_TRACT | Status: DC

## 2014-01-27 MED ORDER — ALPRAZOLAM 0.25 MG PO TABS: 1.0000 mg | ORAL_TABLET | Freq: Three times a day (TID) | ORAL | Status: DC

## 2014-01-27 MED ORDER — BENAZEPRIL-HYDROCHLOROTHIAZIDE 20-25 MG PO TABS: 1.0000 | ORAL_TABLET | Freq: Every day | ORAL | Status: DC

## 2014-01-27 MED ORDER — HYDROCHLOROTHIAZIDE 25 MG PO TABS
25.0000 mg | ORAL_TABLET | Freq: Every day | ORAL | Status: DC
  Administered 2014-01-27: 25 mg via ORAL
  Filled 2014-01-27: qty 1

## 2014-01-27 MED ORDER — AMLODIPINE BESYLATE 5 MG PO TABS: 5.0000 mg | ORAL_TABLET | Freq: Every day | ORAL | Status: DC

## 2014-01-27 MED ORDER — LINACLOTIDE 290 MCG PO CAPS: 290.0000 ug | ORAL_CAPSULE | Freq: Every day | ORAL | Status: DC

## 2014-01-27 MED ORDER — PANTOPRAZOLE SODIUM 40 MG PO TBEC
80.0000 mg | DELAYED_RELEASE_TABLET | Freq: Every day | ORAL | Status: DC
  Administered 2014-01-27: 80 mg via ORAL
  Filled 2014-01-27: qty 2

## 2014-01-27 MED ORDER — ATORVASTATIN CALCIUM 20 MG PO TABS
20.0000 mg | ORAL_TABLET | Freq: Every day | ORAL | Status: DC
  Administered 2014-01-27: 20 mg via ORAL
  Filled 2014-01-27: qty 1

## 2014-01-27 MED ORDER — ASPIRIN EC 81 MG PO TBEC: 81.0000 mg | DELAYED_RELEASE_TABLET | Freq: Every day | ORAL | Status: DC

## 2014-01-27 MED ORDER — ALPRAZOLAM 0.5 MG PO TABS
1.0000 mg | ORAL_TABLET | Freq: Three times a day (TID) | ORAL | Status: DC | PRN
  Administered 2014-01-27 (×2): 1 mg via ORAL
  Filled 2014-01-27 (×2): qty 2

## 2014-01-27 MED ORDER — LINACLOTIDE 290 MCG PO CAPS
290.0000 ug | ORAL_CAPSULE | Freq: Every day | ORAL | Status: DC
  Administered 2014-01-27: 290 ug via ORAL
  Filled 2014-01-27: qty 1

## 2014-01-27 MED ORDER — HEPARIN (PORCINE) IN NACL 100-0.45 UNIT/ML-% IJ SOLN
800.0000 [IU]/h | INTRAMUSCULAR | Status: DC
  Administered 2014-01-27: 800 [IU]/h via INTRAVENOUS
  Filled 2014-01-27: qty 250

## 2014-01-27 MED ORDER — SACCHAROMYCES BOULARDII 250 MG PO CAPS
250.0000 mg | ORAL_CAPSULE | Freq: Every day | ORAL | Status: DC
  Administered 2014-01-27: 250 mg via ORAL
  Filled 2014-01-27: qty 1

## 2014-01-27 MED ORDER — BENAZEPRIL HCL 20 MG PO TABS
20.0000 mg | ORAL_TABLET | Freq: Every day | ORAL | Status: DC
  Administered 2014-01-27: 20 mg via ORAL
  Filled 2014-01-27: qty 1

## 2014-01-27 MED ORDER — NITROGLYCERIN 2 % TD OINT: 1.0000 [in_us] | TOPICAL_OINTMENT | Freq: Four times a day (QID) | TRANSDERMAL | Status: DC

## 2014-01-27 MED ORDER — ONDANSETRON HCL 4 MG/2ML IJ SOLN: 4.0000 mg | Freq: Three times a day (TID) | INTRAMUSCULAR | Status: DC | PRN

## 2014-01-27 MED ORDER — AMLODIPINE BESYLATE 5 MG PO TABS
5.0000 mg | ORAL_TABLET | Freq: Every day | ORAL | Status: DC
  Administered 2014-01-27: 5 mg via ORAL
  Filled 2014-01-27: qty 1

## 2014-01-27 MED ORDER — HYDROMORPHONE HCL PF 1 MG/ML IJ SOLN
1.0000 mg | INTRAMUSCULAR | Status: AC | PRN
Start: 2014-01-27 — End: 2014-01-27
  Administered 2014-01-27 (×2): 1 mg via INTRAVENOUS
  Filled 2014-01-27 (×2): qty 1

## 2014-01-27 MED ORDER — MORPHINE SULFATE 2 MG/ML IJ SOLN
INTRAMUSCULAR | Status: AC
  Filled 2014-01-27: qty 1

## 2014-01-27 MED ORDER — DIAZEPAM 5 MG PO TABS
10.0000 mg | ORAL_TABLET | Freq: Three times a day (TID) | ORAL | Status: DC
  Administered 2014-01-27 (×2): 10 mg via ORAL
  Filled 2014-01-27 (×2): qty 2

## 2014-01-27 MED ORDER — TIOTROPIUM BROMIDE MONOHYDRATE 18 MCG IN CAPS
18.0000 ug | ORAL_CAPSULE | Freq: Every day | RESPIRATORY_TRACT | Status: DC
Start: 2014-01-27 — End: 2014-01-27
  Administered 2014-01-27: 18 ug via RESPIRATORY_TRACT
  Filled 2014-01-27: qty 5

## 2014-01-27 MED ORDER — CARISOPRODOL 350 MG PO TABS: 350.0000 mg | ORAL_TABLET | Freq: Three times a day (TID) | ORAL | Status: DC

## 2014-01-27 MED ORDER — SUMATRIPTAN SUCCINATE 100 MG PO TABS: 100.0000 mg | ORAL_TABLET | ORAL | Status: DC | PRN

## 2014-01-27 NOTE — Consult Note (Signed)
Primary cardiologist: New  HPI: 53 year-old female with no prior cardiac history for evaluation of chest pain. Echocardiogram in 2009 showed normal LV function. Patient describes chest pain for the past 5 days. The pain is in the left upper chest radiating to the left shoulder, neck and left upper extremity. The pain has been continuous without complete resolution. No associated nausea or diaphoresis. She does note dyspnea on exertion. No orthopnea, PND or pedal edema. The pain increases with inspiration. It is not positional, exertional or related to food. No relieving factors. Because of the above cardiology was asked to evaluate.  Medications Prior to Admission  Medication Sig Dispense Refill  . ALPRAZolam (XANAX) 1 MG tablet Take 1 mg by mouth 3 (three) times daily.      Marland Kitchen amLODipine (NORVASC) 5 MG tablet Take 5 mg by mouth daily.      . benazepril-hydrochlorthiazide (LOTENSIN HCT) 20-25 MG per tablet Take 1 tablet by mouth daily.      . carisoprodol (SOMA) 350 MG tablet Take 350 mg by mouth 3 (three) times daily.      . Cholecalciferol (VITAMIN D3) 5000 UNITS CAPS Take 1 capsule by mouth See admin instructions. Take on Monday, Wednesday, and Friday      . diazepam (VALIUM) 10 MG tablet Take 10 mg by mouth every 8 (eight) hours.      Marland Kitchen esomeprazole (NEXIUM) 40 MG capsule Take 40 mg by mouth daily at 12 noon.      . Linaclotide (LINZESS) 290 MCG CAPS capsule Take 290 mcg by mouth daily.      Marland Kitchen saccharomyces boulardii (FLORASTOR) 250 MG capsule Take 250 mg by mouth daily.      . simvastatin (ZOCOR) 40 MG tablet Take 40 mg by mouth every evening.      . SUMAtriptan (IMITREX) 100 MG tablet Take 100 mg by mouth every 2 (two) hours as needed for migraine. May repeat in 2 hours if headache persists or recurs.      Marland Kitchen tiotropium (SPIRIVA) 18 MCG inhalation capsule Place 18 mcg into inhaler and inhale daily.        Allergies  Allergen Reactions  . Ciprofloxacin Other (See Comments)    Headache   . Macrobid [Nitrofurantoin Monohyd Macro] Itching  . Sulfa Antibiotics Itching    Past Medical History  Diagnosis Date  . COPD (chronic obstructive pulmonary disease)   . Hypertension   . HLD (hyperlipidemia)   . Smoking   . GERD (gastroesophageal reflux disease)   . Hiatal hernia     Past Surgical History  Procedure Laterality Date  . Cholecystectomy    . Hernia repair    . Abdominal hysterectomy    . Tonsillectomy    . Adenoidectomy    . Neck surgery    . Shoulder surgery      History   Social History  . Marital Status: Divorced    Spouse Name: N/A    Number of Children: 3  . Years of Education: N/A   Occupational History  .      Disability   Social History Main Topics  . Smoking status: Current Every Day Smoker  . Smokeless tobacco: Not on file  . Alcohol Use: No  . Drug Use: No  . Sexual Activity: Yes    Birth Control/ Protection: Surgical   Other Topics Concern  . Not on file   Social History Narrative  . No narrative on file    Family History  Problem Relation Age of Onset  . CAD Father     onset age 36    ROS:  Neck and back pain; chills but no fevers, productive cough, hemoptysis, dysphasia, odynophagia, melena, hematochezia, dysuria, hematuria, rash, seizure activity, orthopnea, PND, pedal edema, claudication. Remaining systems are negative.  Physical Exam:   Blood pressure 96/61, pulse 80, temperature 98.1 F (36.7 C), temperature source Oral, resp. rate 18, height '5\' 4"'  (1.626 m), weight 134 lb (60.782 kg), SpO2 99.00%.  General:  Well developed/well nourished in NAD Skin warm/dry Patient not depressed No peripheral clubbing Back-normal HEENT-normal/normal eyelids Neck supple/normal carotid upstroke bilaterally; no bruits; no JVD; no thyromegaly chest - CTA/ normal expansion CV - RRR/normal S1 and S2; no murmurs, rubs or gallops;  PMI nondisplaced Abdomen -NT/ND, no HSM, no mass, + bowel sounds, no bruit 2+ femoral pulses, no  bruits Ext-no edema, chords, 2+ DP Neuro-grossly nonfocal  ECG NSR, RVCD, no ST changes  Results for orders placed during the hospital encounter of 01/26/14 (from the past 48 hour(s))  CBC     Status: Abnormal   Collection Time    01/26/14  9:40 PM      Result Value Ref Range   WBC 6.2  4.0 - 10.5 K/uL   RBC 4.46  3.87 - 5.11 MIL/uL   Hemoglobin 13.9  12.0 - 15.0 g/dL   HCT 38.0  36.0 - 46.0 %   MCV 85.2  78.0 - 100.0 fL   MCH 31.2  26.0 - 34.0 pg   MCHC 36.6 (*) 30.0 - 36.0 g/dL   RDW 11.8  11.5 - 15.5 %   Platelets 199  150 - 400 K/uL  BASIC METABOLIC PANEL     Status: Abnormal   Collection Time    01/26/14  9:40 PM      Result Value Ref Range   Sodium 130 (*) 137 - 147 mEq/L   Potassium 3.6 (*) 3.7 - 5.3 mEq/L   Chloride 90 (*) 96 - 112 mEq/L   CO2 25  19 - 32 mEq/L   Glucose, Bld 94  70 - 99 mg/dL   BUN 4 (*) 6 - 23 mg/dL   Creatinine, Ser 0.59  0.50 - 1.10 mg/dL   Calcium 9.4  8.4 - 10.5 mg/dL   GFR calc non Af Amer >90  >90 mL/min   GFR calc Af Amer >90  >90 mL/min   Comment: (NOTE)     The eGFR has been calculated using the CKD EPI equation.     This calculation has not been validated in all clinical situations.     eGFR's persistently <90 mL/min signify possible Chronic Kidney     Disease.   Anion gap 15  5 - 15  PRO B NATRIURETIC PEPTIDE     Status: Abnormal   Collection Time    01/26/14  9:40 PM      Result Value Ref Range   Pro B Natriuretic peptide (BNP) 230.0 (*) 0 - 125 pg/mL  I-STAT TROPOININ, ED     Status: None   Collection Time    01/26/14  9:48 PM      Result Value Ref Range   Troponin i, poc 0.00  0.00 - 0.08 ng/mL   Comment 3            Comment: Due to the release kinetics of cTnI,     a negative result within the first hours     of the onset of symptoms does  not rule out     myocardial infarction with certainty.     If myocardial infarction is still suspected,     repeat the test at appropriate intervals.  TROPONIN I     Status: None    Collection Time    01/27/14  1:44 AM      Result Value Ref Range   Troponin I <0.30  <0.30 ng/mL   Comment:            Due to the release kinetics of cTnI,     a negative result within the first hours     of the onset of symptoms does not rule out     myocardial infarction with certainty.     If myocardial infarction is still suspected,     repeat the test at appropriate intervals.  TROPONIN I     Status: None   Collection Time    01/27/14  4:33 AM      Result Value Ref Range   Troponin I <0.30  <0.30 ng/mL   Comment:            Due to the release kinetics of cTnI,     a negative result within the first hours     of the onset of symptoms does not rule out     myocardial infarction with certainty.     If myocardial infarction is still suspected,     repeat the test at appropriate intervals.  CBC     Status: None   Collection Time    01/27/14  4:33 AM      Result Value Ref Range   WBC 5.0  4.0 - 10.5 K/uL   RBC 4.29  3.87 - 5.11 MIL/uL   Hemoglobin 12.8  12.0 - 15.0 g/dL   HCT 37.0  36.0 - 46.0 %   MCV 86.2  78.0 - 100.0 fL   MCH 29.8  26.0 - 34.0 pg   MCHC 34.6  30.0 - 36.0 g/dL   RDW 12.1  11.5 - 15.5 %   Platelets 186  150 - 400 K/uL    Dg Chest 2 View  01/26/2014   CLINICAL DATA:  Midsternal chest pain, radiating to the left side. Shortness of breath. History of smoking.  EXAM: CHEST  2 VIEW  COMPARISON:  CTA of the chest performed 10/09/2007  FINDINGS: The lungs are well-aerated and clear. There is no evidence of focal opacification, pleural effusion or pneumothorax.  The heart is normal in size; the mediastinal contour is within normal limits. No acute osseous abnormalities are seen. Cervical spinal fusion hardware is partially imaged. Clips are noted within the right upper quadrant, reflecting prior cholecystectomy.  IMPRESSION: No acute cardiopulmonary process seen.   Electronically Signed   By: Garald Balding M.D.   On: 01/26/2014 22:43    Assessment/Plan 1 chest  pain-patient symptoms are extremely atypical. They seem most consistent with musculoskeletal pain. Her pain has been continuous for 5 days without complete resolution. Her electrocardiogram shows no ST changes and her enzymes are negative. Her chest pain is reproduced with palpation of her left chest. Would treat with nonsteroidal anti-inflammatory. Discontinue nitroglycerin paste and heparin. Check echocardiogram. If LV function normal would not pursue further cardiac workup. 2 hypertension-continue preadmission medications. 3 hyperlipidemia-continue statin. 4 tobacco abuse-patient counseled on discontinuing.  Kirk Ruths MD 01/27/2014, 7:46 AM

## 2014-01-27 NOTE — H&P (Signed)
[facility] History and Physical  Katelyn Dallyllen A Locklin UEA:[id number] DOB: 04/27/1961 DOA: 01/26/2014  Referring physician: EDP PCP: No primary provider on file.   Chief Complaint: Chest pain   HPI: Katelyn [name] is a 53 y.o. female who presents to the ED with chest pain.  Symptoms onset 3 days ago, there is associated SOB, DOE, radiation to left arm and left neck.  Symptoms have been gradual in onset and persistently worsening in that time period.  Nothing makes it better but activity makes the CP and SOB worse.  No fever, chills, headache, abd pain, N/V/D.  Did have an episode of the same several years ago and was diagnosed with bronchitis.  Some wheezing on arrival but sating 100% on r/a, steroids and neb treatments have made the wheezing go away but have failed to provide any improvement to the chest pain.  Review of Systems: Systems reviewed.  As above, otherwise negative  Past Medical History  Diagnosis Date  . COPD (chronic obstructive pulmonary disease)   . Hypertension   . HLD (hyperlipidemia)   . Smoking    Past Surgical History  Procedure Laterality Date  . Cholecystectomy    . Hernia repair    . Abdominal hysterectomy    . Tonsillectomy    . Adenoidectomy    . Neck surgery     Social History:  reports that she has been smoking.  She does not have any smokeless tobacco history on file. She reports that she does not drink alcohol or use illicit drugs.  Allergies  Allergen Reactions  . Ciprofloxacin Other (See Comments)    Headache  . Macrobid [Nitrofurantoin Monohyd Macro] Itching  . Sulfa Antibiotics Itching    History reviewed. No pertinent family history.   Prior to Admission medications   Medication Sig Start Date End Date Taking? Authorizing Provider  ALPRAZolam Prudy Feeler(XANAX) 1 MG tablet Take 1 mg by mouth 3 (three) times daily.   Yes Historical Provider, MD  amLODipine (NORVASC) 5 MG tablet Take 5 mg by mouth daily.   Yes Historical Provider, MD   benazepril-hydrochlorthiazide (LOTENSIN HCT) 20-25 MG per tablet Take 1 tablet by mouth daily.   Yes Historical Provider, MD  carisoprodol (SOMA) 350 MG tablet Take 350 mg by mouth 3 (three) times daily.   Yes Historical Provider, MD  Cholecalciferol (VITAMIN D3) 5000 UNITS CAPS Take 1 capsule by mouth See admin instructions. Take on Monday, Wednesday, and Friday   Yes Historical Provider, MD  diazepam (VALIUM) 10 MG tablet Take 10 mg by mouth every 8 (eight) hours.   Yes Historical Provider, MD  esomeprazole (NEXIUM) 40 MG capsule Take 40 mg by mouth daily at 12 noon.   Yes Historical Provider, MD  Linaclotide (LINZESS) 290 MCG CAPS capsule Take 290 mcg by mouth daily.   Yes Historical Provider, MD  saccharomyces boulardii (FLORASTOR) 250 MG capsule Take 250 mg by mouth daily.   Yes Historical Provider, MD  simvastatin (ZOCOR) 40 MG tablet Take 40 mg by mouth every evening.   Yes Historical Provider, MD  SUMAtriptan (IMITREX) 100 MG tablet Take 100 mg by mouth every 2 (two) hours as needed for migraine. May repeat in 2 hours if headache persists or recurs.   Yes Historical Provider, MD  tiotropium (SPIRIVA) 18 MCG inhalation capsule Place 18 mcg into inhaler and inhale daily.   Yes Historical Provider, MD   Physical Exam: Filed Vitals:   01/26/14 2245  BP: 133/89  Pulse: 68  Temp:  Resp: 24    BP 133/89  Pulse 68  Temp(Src) 97.5 F (36.4 C) (Oral)  Resp 24  Ht 5\' 4"  (1.626 m)  Wt 58.968 kg (130 lb)  BMI 22.30 kg/m2  SpO2 100%  General Appearance:    Alert, oriented, no distress, appears stated age  Head:    Normocephalic, atraumatic  Eyes:    PERRL, EOMI, sclera non-icteric        Nose:   Nares without drainage or epistaxis. Mucosa, turbinates normal  Throat:   Moist mucous membranes. Oropharynx without erythema or exudate.  Neck:   Supple. No carotid bruits.  No thyromegaly.  No lymphadenopathy.   Back:     No CVA tenderness, no spinal tenderness  Lungs:     Clear to  auscultation bilaterally, without wheezes, rhonchi or rales  Chest wall:    No tenderness to palpitation  Heart:    Regular rate and rhythm without murmurs, gallops, rubs  Abdomen:     Soft, non-tender, nondistended, normal bowel sounds, no organomegaly  Genitalia:    deferred  Rectal:    deferred  Extremities:   No clubbing, cyanosis or edema.  Pulses:   2+ and symmetric all extremities  Skin:   Skin color, texture, turgor normal, no rashes or lesions  Lymph nodes:   Cervical, supraclavicular, and axillary nodes normal  Neurologic:   CNII-XII intact. Normal strength, sensation and reflexes      throughout    Labs on Admission:  Basic Metabolic Panel:  Recent Labs Lab 01/26/14 2140  NA 130*  K 3.6*  CL 90*  CO2 25  GLUCOSE 94  BUN 4*  CREATININE 0.59  CALCIUM 9.4   Liver Function Tests: No results found for this basename: AST, ALT, ALKPHOS, BILITOT, PROT, ALBUMIN,  in the last 168 hours No results found for this basename: LIPASE, AMYLASE,  in the last 168 hours No results found for this basename: AMMONIA,  in the last 168 hours CBC:  Recent Labs Lab 01/26/14 2140  WBC 6.2  HGB 13.9  HCT 38.0  MCV 85.2  PLT 199   Cardiac Enzymes: No results found for this basename: CKTOTAL, CKMB, CKMBINDEX, TROPONINI,  in the last 168 hours  BNP (last 3 results)  Recent Labs  01/26/14 2140  PROBNP 230.0*   CBG: No results found for this basename: GLUCAP,  in the last 168 hours  Radiological Exams on Admission: Dg Chest 2 View  01/26/2014   CLINICAL DATA:  Midsternal chest pain, radiating to the left side. Shortness of breath. History of smoking.  EXAM: CHEST  2 VIEW  COMPARISON:  CTA of the chest performed 10/09/2007  FINDINGS: The lungs are well-aerated and clear. There is no evidence of focal opacification, pleural effusion or pneumothorax.  The heart is normal in size; the mediastinal contour is within normal limits. No acute osseous abnormalities are seen. Cervical spinal  fusion hardware is partially imaged. Clips are noted within the right upper quadrant, reflecting prior cholecystectomy.  IMPRESSION: No acute cardiopulmonary process seen.   Electronically Signed   By: Roanna Raider M.D.   On: 01/26/2014 22:43    EKG: Independently reviewed.  Assessment/Plan Principal Problem:   Chest pain   1. Chest pain - HEART score 5: fairly classic history highly suspicious for unstable angina. 1. Serial trops ordered 2. Heparin gtt for ongoing chest pain 3. Nitro paste for ongoing chest pain 4. Morphine for ongoing chest pain 5. Suspect that patient may need some form  of stress test in AM. 6. Do not feel that patient is having a significant amount of COPD exacerbation or acute bronchitis causing her chest pain given 100% O2 sat on room air and good air movement bilaterally without wheezing on my exam despite ongoing chest pain symptoms.  Will order PRN nebs but dont feel further steroids indicated at this point. 2. HTN - continue BP meds 3. HLD - continue statin    Code Status: Full Code  Family Communication: no family in room Disposition Plan: Admit to obs   Time spent: 70 min  Mikaia Janvier M. [facility] Pager [phone]  If 7AM-7PM, please contact the day team taking care of the patient [email or web address] Password [email or web address] 01/27/2014, 1:46 AM

## 2014-01-27 NOTE — ED Notes (Signed)
Dr. Gardener at bedside. 

## 2014-01-27 NOTE — Progress Notes (Signed)
UR Completed.  Katelyn [name] [phone] 01/27/2014  

## 2014-01-27 NOTE — Progress Notes (Signed)
ANTICOAGULATION CONSULT NOTE - Initial Consult  Pharmacy Consult for Heparin  Indication: chest pain/ACS  Allergies  Allergen Reactions  . Ciprofloxacin Other (See Comments)    Headache  . Macrobid [Nitrofurantoin Monohyd Macro] Itching  . Sulfa Antibiotics Itching    Patient Measurements: Height: 5\' 4"  (162.6 cm) Weight: 130 lb (58.968 kg) IBW/kg (Calculated) : 54.7  Vital Signs: Temp: 97.5 F (36.4 C) (08/09 2112) Temp src: Oral (08/09 2112) BP: 133/89 mmHg (08/09 2245) Pulse Rate: 68 (08/09 2245)  Labs:  Recent Labs  01/26/14 2140  HGB 13.9  HCT 38.0  PLT 199  CREATININE 0.59     Medical History: Past Medical History  Diagnosis Date  . COPD (chronic obstructive pulmonary disease)   . Hypertension   . HLD (hyperlipidemia)   . Smoking    Assessment: 53 y/o F to start heparin for CP. CBC good, renal function good, other labs as above.   Goal of Therapy:  Heparin level 0.3-0.7 units/ml Monitor platelets by anticoagulation protocol: Yes   Plan:  -Heparin 3500 units BOLUS -Start heparin drip at 800 units/hr -0900 HL -Daily CBC/HL -Monitor for bleeding  Abran DukeLedford, Shalynn Jorstad 01/27/2014,1:53 AM

## 2014-01-27 NOTE — Discharge Summary (Signed)
Physician Discharge Summary  Katelyn [name] WGN:[id number] DOB: 06/13/1961 DOA: 01/26/2014  PCP: Pcp Not In System  Admit date: 01/26/2014 Discharge date: 01/27/2014  Time spent: >35 minutes  Recommendations for Outpatient Follow-up:  F/u with PCP in 1 week  Discharge Diagnoses:  Principal Problem:   Chest pain Active Problems:   HTN (hypertension)   HLD (hyperlipidemia)   Discharge Condition: stable   Diet recommendation: heart healthy   Filed Weights   01/26/14 2112 01/27/14 0226  Weight: 58.968 kg (130 lb) 60.782 kg (134 lb)    History of present illness:  53 y/o female with COPD, tobacco use, h/o cervical spine surgery, neuropathy presented with chest pain    Hospital Course:  1. Chest pain, atypical; trop, ecg unremarkable; chest wall; tenderness on palpation  -ACS ruled out; echo: normal LV; no wall motion abnormalitiies; cont outpatient follow up  2. Cervical neuropathy, neuro exam no focal; strength, sensation intact; cont OP follow up  3. COPD; tobacco use; exam no wheezing; CXR no infiltrates'  -cont inhalers prn; stop smoking     Echo : Study Conclusions  - Left ventricle: The cavity size was normal. Wall thickness was normal. Systolic function was normal. The estimated ejection fraction was in the range of 55% to 60%. Wall motion was normal; there were no regional wall motion abnormalities. Doppler parameters are consistent with abnormal left ventricular relaxation (grade 1 diastolic dysfunction).  Impressions:  - Normal LV function; grade 1 diastolic dysfunction; mild TR.     Procedures: Study Conclusions  - Left ventricle: The cavity size was normal. Wall thickness was normal. Systolic function was normal. The estimated ejection fraction was in the range of 55% to 60%. Wall motion was normal; there were no regional wall motion abnormalities. Doppler parameters are consistent with abnormal left ventricular relaxation (grade 1 diastolic  dysfunction).  Impressions:  - Normal LV function; grade 1 diastolic dysfunction; mild TR.   (i.e. Studies not automatically included, echos, thoracentesis, etc; not x-rays)  Consultations:  Cardiology   Discharge Exam: Filed Vitals:   01/27/14 1403  BP: 104/70  Pulse: 84  Temp: 98.2 F (36.8 C)  Resp: 18    General: alert Cardiovascular: s1,s2 rrr Respiratory: CTA BL  Discharge Instructions     Medication List         albuterol (2.5 MG/3ML) 0.083% nebulizer solution  Commonly known as:  PROVENTIL  Take 3 mLs (2.5 mg total) by nebulization every 6 (six) hours as needed for wheezing or shortness of breath.     albuterol 108 (90 BASE) MCG/ACT inhaler  Commonly known as:  PROVENTIL HFA;VENTOLIN HFA  Inhale 2 puffs into the lungs every 6 (six) hours as needed for wheezing or shortness of breath.     ALPRAZolam 1 MG tablet  Commonly known as:  XANAX  Take 1 tablet (1 mg total) by mouth 3 (three) times daily.     amLODipine 5 MG tablet  Commonly known as:  NORVASC  Take 1 tablet (5 mg total) by mouth daily.     aspirin EC 81 MG tablet  Take 1 tablet (81 mg total) by mouth daily.     benazepril-hydrochlorthiazide 20-25 MG per tablet  Commonly known as:  LOTENSIN HCT  Take 1 tablet by mouth daily.     carisoprodol 350 MG tablet  Commonly known as:  SOMA  Take 1 tablet (350 mg total) by mouth 3 (three) times daily.     diazepam 10 MG tablet  Commonly known  as:  VALIUM  Take 10 mg by mouth every 8 (eight) hours.     esomeprazole 40 MG capsule  Commonly known as:  NEXIUM  Take 40 mg by mouth daily at 12 noon.     Linaclotide 290 MCG Caps capsule  Commonly known as:  LINZESS  Take 1 capsule (290 mcg total) by mouth daily.     saccharomyces boulardii 250 MG capsule  Commonly known as:  FLORASTOR  Take 250 mg by mouth daily.     simvastatin 40 MG tablet  Commonly known as:  ZOCOR  Take 1 tablet (40 mg total) by mouth every evening.     SUMAtriptan 100  MG tablet  Commonly known as:  IMITREX  Take 1 tablet (100 mg total) by mouth every 2 (two) hours as needed for migraine. May repeat in 2 hours if headache persists or recurs.     tiotropium 18 MCG inhalation capsule  Commonly known as:  SPIRIVA  Place 1 capsule (18 mcg total) into inhaler and inhale daily.     Vitamin D3 5000 UNITS Caps  Take 1 capsule by mouth See admin instructions. Take on Monday, Wednesday, and Friday       Allergies  Allergen Reactions  . Ciprofloxacin Other (See Comments)    Headache  . Macrobid [Nitrofurantoin Monohyd Macro] Itching  . Sulfa Antibiotics Itching       Follow-up Information   Follow up with [facility]     In 1 week.   Contact information:   [address] [place] [address] [phone]       The results of significant diagnostics from this hospitalization (including imaging, microbiology, ancillary and laboratory) are listed below for reference.    Significant Diagnostic Studies: Dg Chest 2 View  01/26/2014   CLINICAL DATA:  Midsternal chest pain, radiating to the left side. Shortness of breath. History of smoking.  EXAM: CHEST  2 VIEW  COMPARISON:  CTA of the chest performed 10/09/2007  FINDINGS: The lungs are well-aerated and clear. There is no evidence of focal opacification, pleural effusion or pneumothorax.  The heart is normal in size; the mediastinal contour is within normal limits. No acute osseous abnormalities are seen. Cervical spinal fusion hardware is partially imaged. Clips are noted within the right upper quadrant, reflecting prior cholecystectomy.  IMPRESSION: No acute cardiopulmonary process seen.   Electronically Signed   By: Roanna Raider M.D.   On: 01/26/2014 22:43    Microbiology: No results found for this or any previous visit (from the past 240 hour(s)).   Labs: Basic Metabolic Panel:  Recent Labs Lab 01/26/14 2140  NA 130*  K 3.6*  CL 90*  CO2 25  GLUCOSE 94   BUN 4*  CREATININE 0.59  CALCIUM 9.4   Liver Function Tests: No results found for this basename: AST, ALT, ALKPHOS, BILITOT, PROT, ALBUMIN,  in the last 168 hours No results found for this basename: LIPASE, AMYLASE,  in the last 168 hours No results found for this basename: AMMONIA,  in the last 168 hours CBC:  Recent Labs Lab 01/26/14 2140 01/27/14 0433  WBC 6.2 5.0  HGB 13.9 12.8  HCT 38.0 37.0  MCV 85.2 86.2  PLT 199 186   Cardiac Enzymes:  Recent Labs Lab 01/27/14 0144 01/27/14 0433 01/27/14 0731  TROPONINI <0.30 <0.30 <0.30   BNP: BNP (last 3 results)  Recent Labs  01/26/14 2140  PROBNP 230.0*   CBG: No results found  for this basename: GLUCAP,  in the last 168 hours     Signed:  Esperanza Sheets  [facility] 01/28/2014, 11:37 AM

## 2014-01-27 NOTE — Progress Notes (Signed)
  Echocardiogram 2D Echocardiogram has been performed.  Katelyn [name], Katelyn [name] 01/27/2014, 4:25 PM

## 2014-01-27 NOTE — Progress Notes (Signed)
[facility] PROGRESS NOTE  Katelyn [name] ZOX:[id number] DOB: 1960/11/14 DOA: 01/26/2014 PCP: Pcp Not In System  Assessment/Plan: 53 y/o female with COPD, tobacco use, h/o cervical spine surgery, neuropathy presented with chest pain  1. Chest pain, atypical; trop, ecg unremarkable; chest wall; tenderness on palpation  -ACS ruled out; pend echo, if normal likely to d/c home with outpatient follow up   2. Cervical neuropathy, neuro exam no focal; strength, sensation intact; cont OP follow up  3. COPD; tobacco use; exam no wheezing; CXR no infiltrates'  -cont inhalers prn; stop smoking    Code Status: full Family Communication:  D/w patient  (indicate person spoken with, relationship, and if by phone, the number) Disposition Plan: home 24-48 hrs    Consultants:  Cardiology   Procedures:  none  Antibiotics:  none (indicate start date, and stop date if known)  HPI/Subjective: alert  Objective: Filed Vitals:   01/27/14 0500  BP: 96/61  Pulse: 80  Temp: 98.1 F (36.7 C)  Resp: 18   No intake or output data in the 24 hours ending 01/27/14 1008 Filed Weights   01/26/14 2112 01/27/14 0226  Weight: 58.968 kg (130 lb) 60.782 kg (134 lb)    Exam:   General:  alert  Cardiovascular: s1,s2 rrr  Respiratory: CTA BL  Abdomen: soft, nt,nd   Musculoskeletal: no LE edema   Data Reviewed: Basic Metabolic Panel:  Recent Labs Lab 01/26/14 2140  NA 130*  K 3.6*  CL 90*  CO2 25  GLUCOSE 94  BUN 4*  CREATININE 0.59  CALCIUM 9.4   Liver Function Tests: No results found for this basename: AST, ALT, ALKPHOS, BILITOT, PROT, ALBUMIN,  in the last 168 hours No results found for this basename: LIPASE, AMYLASE,  in the last 168 hours No results found for this basename: AMMONIA,  in the last 168 hours CBC:  Recent Labs Lab 01/26/14 2140 01/27/14 0433  WBC 6.2 5.0  HGB 13.9 12.8  HCT 38.0 37.0  MCV 85.2 86.2  PLT 199 186   Cardiac Enzymes:  Recent  Labs Lab 01/27/14 0144 01/27/14 0433 01/27/14 0731  TROPONINI <0.30 <0.30 <0.30   BNP (last 3 results)  Recent Labs  01/26/14 2140  PROBNP 230.0*   CBG: No results found for this basename: GLUCAP,  in the last 168 hours  No results found for this or any previous visit (from the past 240 hour(s)).   Studies: Dg Chest 2 View  01/26/2014   CLINICAL DATA:  Midsternal chest pain, radiating to the left side. Shortness of breath. History of smoking.  EXAM: CHEST  2 VIEW  COMPARISON:  CTA of the chest performed 10/09/2007  FINDINGS: The lungs are well-aerated and clear. There is no evidence of focal opacification, pleural effusion or pneumothorax.  The heart is normal in size; the mediastinal contour is within normal limits. No acute osseous abnormalities are seen. Cervical spinal fusion hardware is partially imaged. Clips are noted within the right upper quadrant, reflecting prior cholecystectomy.  IMPRESSION: No acute cardiopulmonary process seen.   Electronically Signed   By: Roanna Raider M.D.   On: 01/26/2014 22:43    Scheduled Meds: . amLODipine  5 mg Oral Daily  . aspirin  325 mg Oral Daily  . atorvastatin  20 mg Oral q1800  . benazepril  20 mg Oral Daily  . carisoprodol  350 mg Oral TID  . diazepam  10 mg Oral 3 times per day  . hydrochlorothiazide  25 mg  Oral Daily  . Linaclotide  290 mcg Oral Daily  . pantoprazole  80 mg Oral Q1200  . saccharomyces boulardii  250 mg Oral Daily  . tiotropium  18 mcg Inhalation Daily   Continuous Infusions:   Principal Problem:   Chest pain Active Problems:   HTN (hypertension)   HLD (hyperlipidemia)    Time spent: >35 minutes     Esperanza SheetsBURIEV, Cecile Gillispie N  [facility] Pager [phone]. If 7PM-7AM, please contact night-coverage at [email or web address], password [email or web address] 01/27/2014, 10:08 AM  LOS: 1 day

## 2014-01-27 NOTE — Discharge Instructions (Signed)
Please follow up with primary  Care doctor in 1 week

## 2014-01-28 NOTE — ED Provider Notes (Signed)
Medical screening examination/treatment/procedure(s) were performed by non-physician practitioner and as supervising physician I was immediately available for consultation/collaboration.  Megan Docherty, MD 01/28/14 1238 

## 2014-10-10 NOTE — Op Note (Signed)
PATIENT NAME:  Katelyn DallyHAMILTON, Katelyn [name] MR#:  [id number] DATE OF BIRTH:  13-Apr-1961  DATE OF PROCEDURE:  10/11/2012  INDICATION:  This is [name] 54 year old patient who has COPD, is [name] smoker and coughs all the time. She developed [name] lump in the lateral part of the abdomen above the inguinal area.  I did [name] CAT scan.  It looked like it was not an inguinal hernia. It looked like maybe spigelian hernia. It was way laterally towards the left lower quadrant. The problem with this hernia, when the patient laid down, you could not feel it. Only time we could feel it was when she stands up and when she coughs.  So I localized this area preop with [name] circle and then the patient was brought to surgery.   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   POSTOPERATIVE DIAGNOSIS: Laparoscopy and repair of spigelian hernia with mesh.  DESCRIPTION OF PROCEDURE:  Under general anesthesia, I made [name] small incision in the left lower quadrant where I could feel this defect and, after cutting skin and subcutaneous tissue, the muscle was reached and I could see [name] vague defect. I was not 100% sure that that was the area she was bulging out so I did [name] laparoscopy and put air in the stomach and you could see this area bulged out and with laparoscopy I could see the hernia also in way right lower quadrant. So, after knowing this area, I dissected out everything and put [name] circular mesh extraperitoneally behind the muscles and then sutured the muscle with interrupted 0 Vicryl sutures.  Very good repair was done. After that I closed the laparoscopy incision, which I did [name] Hassan trocar. I closed it with interrupted 0 Vicryl sutures.  Skin staples were applied and Marcaine was injected. The patient tolerated the procedure well and was sent to the recovery room in satisfactory condition.  ____________________________ Alton RevereMasud S. Cecelia ByarsHashmi, MD msh:sb D: 10/11/2012 13:13:34 ET T: 10/11/2012 13:54:59 ET JOB#: [id number]  cc: Keland Peyton S. Cecelia ByarsHashmi, MD,  <Dictator> Meryle ReadyMASUD S Jarion Hawthorne MD ELECTRONICALLY SIGNED 10/18/2012 14:01

## 2014-12-16 ENCOUNTER — Ambulatory Visit: Payer: Self-pay

## 2015-01-07 ENCOUNTER — Ambulatory Visit (INDEPENDENT_AMBULATORY_CARE_PROVIDER_SITE_OTHER): Payer: Medicaid Other | Admitting: Urology

## 2015-01-07 ENCOUNTER — Encounter: Payer: Self-pay | Admitting: Urology

## 2015-01-07 VITALS — BP 117/86 | HR 99 | Temp 98.2°F | Resp 18 | Ht 63.0 in | Wt 147.5 lb

## 2015-01-07 DIAGNOSIS — R300 Dysuria: Secondary | ICD-10-CM

## 2015-01-07 DIAGNOSIS — N3281 Overactive bladder: Secondary | ICD-10-CM | POA: Diagnosis not present

## 2015-01-07 DIAGNOSIS — R312 Other microscopic hematuria: Secondary | ICD-10-CM | POA: Diagnosis not present

## 2015-01-07 DIAGNOSIS — R3129 Other microscopic hematuria: Secondary | ICD-10-CM

## 2015-01-07 LAB — URINALYSIS, COMPLETE
Bilirubin, UA: NEGATIVE
Glucose, UA: NEGATIVE
KETONES UA: NEGATIVE
Leukocytes, UA: NEGATIVE
Nitrite, UA: NEGATIVE
PH UA: 7 (ref 5.0–7.5)
PROTEIN UA: NEGATIVE
SPEC GRAV UA: 1.015 (ref 1.005–1.030)
Urobilinogen, Ur: 0.2 mg/dL (ref 0.2–1.0)

## 2015-01-07 LAB — MICROSCOPIC EXAMINATION: BACTERIA UA: NONE SEEN

## 2015-01-07 NOTE — Progress Notes (Signed)
01/07/2015 4:04 PM   Katelyn [name] 1961-03-20 [id number]  Referring provider: Evelene Croon, MD [facility] [address] [place], [place] [address]  Chief Complaint  Patient presents with  . Dysuria  . Urinary Frequency  . Establish Care    HPI: Patient comes in having tried multiple anticholinergics with no success. Voids every 15 minutes 4-4 times at night. Occasional leakage of urine. Has tremendous urge to go. Drinks only water very little T no coffee or some soda. This patient is a a sickly a medication failure. But she does not remember any of the med sheet. Does not want to try any invasive procedures such as Botox PT and S or InterStim. Placed her on Vesicare 10 mg daily we'll see her in a month. Reevaluate her at that time.     PMH: Past Medical History  Diagnosis Date  . COPD (chronic obstructive pulmonary disease)   . Hypertension   . HLD (hyperlipidemia)   . Smoking   . GERD (gastroesophageal reflux disease)   . Hiatal hernia     Surgical History: Past Surgical History  Procedure Laterality Date  . Cholecystectomy    . Hernia repair    . Abdominal hysterectomy    . Tonsillectomy    . Adenoidectomy    . Neck surgery    . Shoulder surgery      Home Medications:    Medication List       This list is accurate as of: 01/07/15  4:04 PM.  Always use your most recent med list.               ADVAIR DISKUS 250-50 MCG/DOSE Aepb  Generic drug:  Fluticasone-Salmeterol  INHALE 1 PUFF INTO THE LUNGS TWICE A DAY     albuterol (2.5 MG/3ML) 0.083% nebulizer solution  Commonly known as:  PROVENTIL  Take 3 mLs (2.5 mg total) by nebulization every 6 (six) hours as needed for wheezing or shortness of breath.     albuterol 108 (90 BASE) MCG/ACT inhaler  Commonly known as:  PROVENTIL HFA;VENTOLIN HFA  Inhale 2 puffs into the lungs every 6 (six) hours as needed for wheezing or shortness of breath.     amLODipine 5 MG tablet  Commonly known as:  NORVASC  Take 1 tablet (5  mg total) by mouth daily.     aspirin EC 81 MG tablet  Take 1 tablet (81 mg total) by mouth daily.     atorvastatin 40 MG tablet  Commonly known as:  LIPITOR  Take 40 mg by mouth daily.     benazepril-hydrochlorthiazide 20-25 MG per tablet  Commonly known as:  LOTENSIN HCT  Take 1 tablet by mouth daily.     diazepam 10 MG tablet  Commonly known as:  VALIUM  Take 10 mg by mouth every 8 (eight) hours.     esomeprazole 40 MG capsule  Commonly known as:  NEXIUM  Take 40 mg by mouth daily at 12 noon.     hydrochlorothiazide 25 MG tablet  Commonly known as:  HYDRODIURIL  Take 25 mg by mouth daily.     Linaclotide 290 MCG Caps capsule  Commonly known as:  LINZESS  Take 1 capsule (290 mcg total) by mouth daily.     methocarbamol 500 MG tablet  Commonly known as:  ROBAXIN  Take 500 mg by mouth 3 (three) times daily.     MIRALAX PO  Take by mouth.     oxybutynin 5 MG tablet  Commonly known  as:  DITROPAN  Take 5 mg by mouth 2 (two) times daily.     saccharomyces boulardii 250 MG capsule  Commonly known as:  FLORASTOR  Take 250 mg by mouth daily.     SUMAtriptan 100 MG tablet  Commonly known as:  IMITREX  Take 1 tablet (100 mg total) by mouth every 2 (two) hours as needed for migraine. May repeat in 2 hours if headache persists or recurs.     tiotropium 18 MCG inhalation capsule  Commonly known as:  SPIRIVA  Place 1 capsule (18 mcg total) into inhaler and inhale daily.     traZODone 150 MG tablet  Commonly known as:  DESYREL  Take 150 mg by mouth See admin instructions.     Vitamin D3 5000 UNITS Caps  Take 1 capsule by mouth See admin instructions. Take on Monday, Wednesday, and Friday        Allergies:  Allergies  Allergen Reactions  . Ciprofloxacin Other (See Comments)    Headache  . Macrobid [Nitrofurantoin Monohyd Macro] Itching  . Sulfa Antibiotics Itching    Family History: Family History  Problem Relation Age of Onset  . CAD Father     onset age  55    Social History:  reports that she has been smoking.  She does not have any smokeless tobacco history on file. She reports that she does not drink alcohol or use illicit drugs.  ROS: UROLOGY Frequent Urination?: Yes Hard to postpone urination?: Yes Burning/pain with urination?: Yes Get up at night to urinate?: Yes Leakage of urine?: Yes Urine stream starts and stops?: Yes Trouble starting stream?: Yes Do you have to strain to urinate?: Yes Blood in urine?: Yes Urinary tract infection?: Yes Sexually transmitted disease?: No Injury to kidneys or bladder?: No Painful intercourse?: Yes Weak stream?: Yes Currently pregnant?: No Vaginal bleeding?: No Last menstrual period?: n/a  Gastrointestinal Nausea?: Yes Vomiting?: No Indigestion/heartburn?: Yes Diarrhea?: No Constipation?: Yes  Constitutional Fever: No Night sweats?: Yes Weight loss?: Yes Fatigue?: No  Skin Skin rash/lesions?: No Itching?: No  Eyes Blurred vision?: Yes Double vision?: No  Ears/Nose/Throat Sore throat?: No Sinus problems?: Yes  Hematologic/Lymphatic Swollen glands?: No Easy bruising?: Yes  Cardiovascular Leg swelling?: No Chest pain?: Yes  Respiratory Cough?: Yes Shortness of breath?: Yes  Endocrine Excessive thirst?: No  Musculoskeletal Back pain?: Yes Joint pain?: Yes  Neurological Headaches?: Yes Dizziness?: Yes  Psychologic Depression?: Yes Anxiety?: Yes  Physical Exam: BP 117/86 mmHg  Pulse 99  Temp(Src) 98.2 F (36.8 C) (Oral)  Resp 18  Ht 5\' 3"  (1.6 m)  Wt 147 lb 8 oz (66.906 kg)  BMI 26.14 kg/m2  Constitutional:  Alert and oriented, No acute distress. HEENT: [place] AT, moist mucus membranes.  Trachea midline, no masses. Cardiovascular: No clubbing, cyanosis, or edema. Respiratory: Normal respiratory effort, no increased work of breathing. GI: Abdomen is soft, nontender, nondistended, no abdominal masses GU: No CVA tenderness. Pubococcygeal muscle  ligament discomfort that is severe with minimal pressure no cystocele rectocele or Sk[place]in: No rashes, bruises or suspicious lesions. Lymph: No cervical or inguinal adenopathy. Neurologic: Grossly intact, no focal deficits, moving all 4 extremities. Psychiatric: Normal mood and affect.  Laboratory Data: Lab Results  Component Value Date   WBC 5.0 01/27/2014   HGB 12.8 01/27/2014   HCT 37.0 01/27/2014   MCV 86.2 01/27/2014   PLT 186 01/27/2014    Lab Results  Component Value Date   CREATININE 0.59 01/26/2014    No results  found for: PSA  No results found for: TESTOSTERONE  Lab Results  Component Value Date   HGBA1C  11/14/2007    5.4 (NOTE)   The ADA recommends the following therapeutic goals for glycemic   control related to Hgb A1C measurement:   Goal of Therapy:   < 7.0% Hgb A1C   Action Suggested:  > 8.0% Hgb A1C   Ref:  Diabetes Care, 22, Suppl. 1, 1999    Urinalysis    Component Value Date/Time   COLORURINE YELLOW 11/13/2007 2030   APPEARANCEUR CLEAR 11/13/2007 2030   LABSPEC 1.009 11/13/2007 2030   PHURINE 7.0 11/13/2007 2030   GLUCOSEU NEGATIVE 11/13/2007 2030   HGBUR TRACE* 11/13/2007 2030   BILIRUBINUR NEGATIVE 11/13/2007 2030   KETONESUR NEGATIVE 11/13/2007 2030   PROTEINUR NEGATIVE 11/13/2007 2030   UROBILINOGEN 0.2 11/13/2007 2030   NITRITE NEGATIVE 11/13/2007 2030   LEUKOCYTESUR NEGATIVE 11/13/2007 2030    Pertinent Imaging:none   Assessment & Plan:  Overactive bladder with minimal stress incontinence reviewed all options as patient has tried multiple anticholinergics. She would like to try one more anticholinergic here so I placed her on Vesicare 10 mg once a day for 30 days and we'll see her then and review with her her options if this does not work  1. Dysuria  - Urinalysis, Complete  2. OAB (overactive bladder)    No Follow-up on file.  Lorraine Lax, MD  [facility] [address] [place],  [place] [address] [phone]

## 2015-02-11 ENCOUNTER — Ambulatory Visit: Payer: Medicaid Other | Admitting: Urology

## 2015-03-04 ENCOUNTER — Ambulatory Visit: Payer: Medicaid Other | Admitting: Urology

## 2017-03-22 ENCOUNTER — Emergency Department (HOSPITAL_COMMUNITY): Payer: Medicaid Other

## 2017-03-22 ENCOUNTER — Encounter (HOSPITAL_COMMUNITY): Payer: Self-pay | Admitting: *Deleted

## 2017-03-22 ENCOUNTER — Emergency Department (HOSPITAL_COMMUNITY)
Admission: EM | Admit: 2017-03-22 | Discharge: 2017-03-22 | Disposition: A | Discharge status: Home / Self Care | Payer: Medicaid Other | Attending: Emergency Medicine | Admitting: Emergency Medicine

## 2017-03-22 DIAGNOSIS — I10 Essential (primary) hypertension: Secondary | ICD-10-CM | POA: Diagnosis not present

## 2017-03-22 DIAGNOSIS — J449 Chronic obstructive pulmonary disease, unspecified: Secondary | ICD-10-CM | POA: Diagnosis not present

## 2017-03-22 DIAGNOSIS — R350 Frequency of micturition: Secondary | ICD-10-CM | POA: Diagnosis not present

## 2017-03-22 DIAGNOSIS — Z7982 Long term (current) use of aspirin: Secondary | ICD-10-CM | POA: Diagnosis not present

## 2017-03-22 DIAGNOSIS — Z79899 Other long term (current) drug therapy: Secondary | ICD-10-CM | POA: Diagnosis not present

## 2017-03-22 DIAGNOSIS — R1032 Left lower quadrant pain: Secondary | ICD-10-CM | POA: Diagnosis present

## 2017-03-22 DIAGNOSIS — R1084 Generalized abdominal pain: Secondary | ICD-10-CM | POA: Diagnosis not present

## 2017-03-22 DIAGNOSIS — F17200 Nicotine dependence, unspecified, uncomplicated: Secondary | ICD-10-CM | POA: Insufficient documentation

## 2017-03-22 LAB — URINALYSIS, ROUTINE W REFLEX MICROSCOPIC
Bacteria, UA: NONE SEEN
Bilirubin Urine: NEGATIVE
GLUCOSE, UA: NEGATIVE mg/dL
Ketones, ur: NEGATIVE mg/dL
Leukocytes, UA: NEGATIVE
NITRITE: NEGATIVE
PH: 7 (ref 5.0–8.0)
Protein, ur: NEGATIVE mg/dL
SPECIFIC GRAVITY, URINE: 1.008 (ref 1.005–1.030)

## 2017-03-22 LAB — CBC
HCT: 42.3 % (ref 36.0–46.0)
Hemoglobin: 14 g/dL (ref 12.0–15.0)
MCH: 30.6 pg (ref 26.0–34.0)
MCHC: 33.1 g/dL (ref 30.0–36.0)
MCV: 92.6 fL (ref 78.0–100.0)
Platelets: 210 10*3/uL (ref 150–400)
RBC: 4.57 MIL/uL (ref 3.87–5.11)
RDW: 12.4 % (ref 11.5–15.5)
WBC: 4.3 10*3/uL (ref 4.0–10.5)

## 2017-03-22 LAB — BASIC METABOLIC PANEL
Anion gap: 7 (ref 5–15)
BUN: 7 mg/dL (ref 6–20)
CHLORIDE: 103 mmol/L (ref 101–111)
CO2: 26 mmol/L (ref 22–32)
Calcium: 9.2 mg/dL (ref 8.9–10.3)
Creatinine, Ser: 0.8 mg/dL (ref 0.44–1.00)
GFR calc Af Amer: >60 mL/min (ref >60)
GFR calc non Af Amer: >60 mL/min (ref >60)
GLUCOSE: 83 mg/dL (ref 65–99)
POTASSIUM: 3.3 mmol/L — AB (ref 3.5–5.1)
Sodium: 136 mmol/L (ref 135–145)

## 2017-03-22 MED ORDER — MORPHINE SULFATE (PF) 4 MG/ML IV SOLN: 4.0000 mg | Freq: Once | INTRAVENOUS | Status: DC

## 2017-03-22 MED ORDER — MORPHINE SULFATE (PF) 4 MG/ML IV SOLN
2.0000 mg | Freq: Once | INTRAVENOUS | Status: AC
  Administered 2017-03-22: 2 mg via INTRAVENOUS
  Filled 2017-03-22: qty 1

## 2017-03-22 MED ORDER — POLYETHYLENE GLYCOL 3350 17 G PO PACK: 17.0000 g | PACK | Freq: Every day | ORAL | 0 refills | Status: AC | PRN

## 2017-03-22 MED ORDER — IOPAMIDOL (ISOVUE-300) INJECTION 61%
INTRAVENOUS | Status: AC
  Administered 2017-03-22: 100 mL
  Filled 2017-03-22: qty 100

## 2017-03-22 MED ORDER — KETOROLAC TROMETHAMINE 30 MG/ML IJ SOLN
30.0000 mg | Freq: Once | INTRAMUSCULAR | Status: AC
  Administered 2017-03-22: 30 mg via INTRAVENOUS
  Filled 2017-03-22: qty 1

## 2017-03-22 MED ORDER — SODIUM CHLORIDE 0.9 % IV BOLUS (SEPSIS)
1000.0000 mL | Freq: Once | INTRAVENOUS | Status: AC
Start: 2017-03-22 — End: 2017-03-22
  Administered 2017-03-22: 1000 mL via INTRAVENOUS

## 2017-03-22 NOTE — ED Notes (Signed)
Patient transported to CT 

## 2017-03-22 NOTE — ED Triage Notes (Signed)
To ED for eval of flank pain and lower abd pain for past month. States she has pain with urination

## 2017-03-22 NOTE — Discharge Instructions (Signed)
Please follow up with your primary care doctor regarding your symptoms.   The miralax will take a few days to provide relief.  Please make sure you drink plenty of fluids as dehydration can make constipation worse.

## 2017-03-22 NOTE — ED Notes (Signed)
Unable to obtain pt signature for discharge d/t no WOW with signature pad available. Reviewed paperwork and prescription with pt, who had no questions or concerns. Pt departed in NAD, refused use of wheelchair.

## 2017-03-22 NOTE — ED Provider Notes (Signed)
[facility]-EMERGENCY DEPT Provider Note   CSN: [id number] Arrival date & time: 03/22/17  1353     History   Chief Complaint Chief Complaint  Patient presents with  . Flank Pain  . Urinary Frequency    HPI Katelyn [name] is a 55 y.o. female with a history of COPD, Gerd, Hiatal hernia, HTN, HLD, presents with one month of gradually worsening LLQ pain.  She reports that her pain is constant, made worse by attempting to deficate.  She also reports pain with urinating.  No frequency or urgency.  Her last BM was yesterday but she says it was a very small amount of solid stool.  She denies any fevers, no coughs.  She has bilateral low back pain which she says is new however chart review shows it has been treated before.  She has a PCP but has not seen them for this issue.  She was unable to elaborate on why.  No N/V/D.  She denies new cough, headache or rashes.  No numbness or tingling, or weakness in bilateral lower legs.  No loss of sensation in genitals.   HPI  Past Medical History:  Diagnosis Date  . COPD (chronic obstructive pulmonary disease) (HCC)   . GERD (gastroesophageal reflux disease)   . Hiatal hernia   . HLD (hyperlipidemia)   . Hypertension   . Smoking     Patient Active Problem List   Diagnosis Date Noted  . Chest pain 01/27/2014  . HTN (hypertension) 01/27/2014  . HLD (hyperlipidemia) 01/27/2014    Past Surgical History:  Procedure Laterality Date  . ABDOMINAL HYSTERECTOMY    . ADENOIDECTOMY    . CHOLECYSTECTOMY    . HERNIA REPAIR    . NECK SURGERY    . SHOULDER SURGERY    . TONSILLECTOMY      OB History    No data available       Home Medications    Prior to Admission medications   Medication Sig Start Date End Date Taking? Authorizing Provider  ADVAIR DISKUS 250-50 MCG/DOSE AEPB INHALE 1 PUFF INTO THE LUNGS TWICE A DAY 11/17/14   [provider]  albuterol (PROVENTIL HFA;VENTOLIN HFA) 108 (90 BASE) MCG/ACT inhaler Inhale 2 puffs into the  lungs every 6 (six) hours as needed for wheezing or shortness of breath. 01/27/14   Esperanza Sheets, MD  albuterol (PROVENTIL) (2.5 MG/3ML) 0.083% nebulizer solution Take 3 mLs (2.5 mg total) by nebulization every 6 (six) hours as needed for wheezing or shortness of breath. 01/27/14   Esperanza Sheets, MD  amLODipine (NORVASC) 5 MG tablet Take 1 tablet (5 mg total) by mouth daily. 01/27/14   Esperanza Sheets, MD  aspirin EC 81 MG tablet Take 1 tablet (81 mg total) by mouth daily. 01/27/14   Esperanza Sheets, MD  atorvastatin (LIPITOR) 40 MG tablet Take 40 mg by mouth daily. 01/01/15   [provider]  benazepril-hydrochlorthiazide (LOTENSIN HCT) 20-25 MG per tablet Take 1 tablet by mouth daily. 01/27/14   Esperanza Sheets, MD  Cholecalciferol (VITAMIN D3) 5000 UNITS CAPS Take 1 capsule by mouth See admin instructions. Take on Monday, Wednesday, and Friday    [provider]  diazepam (VALIUM) 10 MG tablet Take 10 mg by mouth every 8 (eight) hours.    [provider]  esomeprazole (NEXIUM) 40 MG capsule Take 40 mg by mouth daily at 12 noon.    [provider]  hydrochlorothiazide (HYDRODIURIL) 25 MG tablet Take 25  mg by mouth daily. 10/21/14   [provider]  Linaclotide (LINZESS) 290 MCG CAPS capsule Take 1 capsule (290 mcg total) by mouth daily. 01/27/14   Esperanza Sheets, MD  methocarbamol (ROBAXIN) 500 MG tablet Take 500 mg by mouth 3 (three) times daily. 12/06/14   [provider]  oxybutynin (DITROPAN) 5 MG tablet Take 5 mg by mouth 2 (two) times daily. 12/04/14   [provider]  polyethylene glycol (MIRALAX / GLYCOLAX) packet Take 17 g by mouth daily as needed (Constipation). 03/22/17 04/21/17  Cristina Gong, PA-C  saccharomyces boulardii (FLORASTOR) 250 MG capsule Take 250 mg by mouth daily.    [provider]  SUMAtriptan (IMITREX) 100 MG tablet Take 1 tablet (100 mg total) by mouth every 2 (two) hours as needed for  migraine. May repeat in 2 hours if headache persists or recurs. 01/27/14   Esperanza Sheets, MD  tiotropium (SPIRIVA) 18 MCG inhalation capsule Place 1 capsule (18 mcg total) into inhaler and inhale daily. 01/27/14   Esperanza Sheets, MD  traZODone (DESYREL) 150 MG tablet Take 150 mg by mouth See admin instructions. 12/07/14   [provider]    Family History Family History  Problem Relation Age of Onset  . CAD Father        onset age 42    Social History Social History  Substance Use Topics  . Smoking status: Current Every Day Smoker  . Smokeless tobacco: Not on file  . Alcohol use No     Allergies   Ciprofloxacin; Macrobid [nitrofurantoin monohyd macro]; and Sulfa antibiotics   Review of Systems Review of Systems  Constitutional: Positive for chills. Negative for diaphoresis, fatigue and fever.  HENT: Negative for ear pain and sore throat.   Eyes: Negative for pain and visual disturbance.  Respiratory: Negative for cough and shortness of breath.   Cardiovascular: Negative for chest pain and palpitations.  Gastrointestinal: Positive for abdominal pain and constipation. Negative for blood in stool, nausea and vomiting.  Genitourinary: Positive for dysuria and urgency. Negative for decreased urine volume, frequency, hematuria, vaginal discharge and vaginal pain.  Musculoskeletal: Positive for back pain (Bilateral low back). Negative for arthralgias and neck pain.  Skin: Negative for color change and rash.  Neurological: Negative for seizures and syncope.  All other systems reviewed and are negative.    Physical Exam Updated Vital Signs BP 134/90   Pulse 73   Temp 98.5 F (36.9 C) (Oral)   Resp 16   SpO2 100%   Physical Exam  Constitutional: She is oriented to person, place, and time. She appears well-developed and well-nourished. No distress.  HENT:  Head: Normocephalic and atraumatic.  Mouth/Throat: Oropharynx is clear and moist.  Eyes: Conjunctivae are  normal. No scleral icterus.  Neck: Normal range of motion. Neck supple. No JVD present.  Cardiovascular: Normal rate, regular rhythm, normal heart sounds and intact distal pulses.  Exam reveals no friction rub.   No murmur heard. Pulmonary/Chest: Effort normal and breath sounds normal. No stridor. No respiratory distress. She has no wheezes.  Abdominal: Soft. Normal appearance and bowel sounds are normal. She exhibits no distension and no mass. There is generalized tenderness and tenderness in the left lower quadrant. There is no guarding.  Musculoskeletal: She exhibits no edema.  Bilateral low back pain is easily reproducible by palpation of lumbar paraspinal muscles.  5/5 strength in bilateral upper and lower extremities.    Neurological: She is alert and oriented to person,  place, and time.  Sensation intact to bilateral lower legs.   Skin: Skin is warm and dry.  Psychiatric: She has a normal mood and affect. Her behavior is normal.  Nursing note and vitals reviewed.    ED Treatments / Results  Labs (all labs ordered are listed, but only abnormal results are displayed) Labs Reviewed  URINALYSIS, ROUTINE W REFLEX MICROSCOPIC - Abnormal; Notable for the following:       Result Value   Hgb urine dipstick SMALL (*)    Squamous Epithelial / LPF 0-5 (*)    All other components within normal limits  BASIC METABOLIC PANEL - Abnormal; Notable for the following:    Potassium 3.3 (*)    All other components within normal limits  CBC    EKG  EKG Interpretation None       Radiology Ct Abdomen Pelvis W Contrast  Result Date: 03/22/2017 CLINICAL DATA:  Lower abdominal pain for 1 month. EXAM: CT ABDOMEN AND PELVIS WITH CONTRAST TECHNIQUE: Multidetector CT imaging of the abdomen and pelvis was performed using the standard protocol following bolus administration of intravenous contrast. CONTRAST:  ISOVUE-300 IOPAMIDOL (ISOVUE-300) INJECTION 61% COMPARISON:  CT abdomen pelvis dated  February 06, 2013. FINDINGS: Lower chest: No acute abnormality.  Centrilobular emphysema. 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 Status post cholecystectomy. Mild extrahepatic biliary dilatation, likely related to post cholecystectomy state in the absence of abnormal LFTs. Pancreas: Unremarkable. No pancreatic ductal dilatation or surrounding inflammatory changes. Spleen: Normal in size without focal abnormality. Adrenals/Urinary Tract: Adrenal glands are unremarkable. Kidneys are normal, without renal calculi, focal lesion, or hydronephrosis. Bladder is unremarkable. Stomach/Bowel: Stomach is within normal limits. Appendix appears normal. No evidence of bowel wall thickening, distention, or inflammatory changes. Vascular/Lymphatic: Aortic atherosclerosis. No enlarged abdominal or pelvic lymph nodes. Reproductive: Status post hysterectomy. No adnexal masses. Other: No free fluid or pneumoperitoneum. Musculoskeletal: No acute or significant osseous findings. Unchanged degenerative anterolisthesis of L4 on L5. IMPRESSION: 1. No acute intra-abdominal process. 2.  Aortic atherosclerosis (ICD10-I70.0). Electronically Signed   By: Obie Dredge M.D.   On: 03/22/2017 21:27    Procedures Procedures (including critical care time)  Medications Ordered in ED Medications  sodium chloride 0.9 % bolus 1,000 mL (0 mLs Intravenous Stopped 03/22/17 2204)  morphine 4 MG/ML injection 2 mg (2 mg Intravenous Given 03/22/17 2043)  iopamidol (ISOVUE-300) 61 % injection (100 mLs  Contrast Given 03/22/17 2056)  ketorolac (TORADOL) 30 MG/ML injection 30 mg (30 mg Intravenous Given 03/22/17 2154)     Initial Impression / Assessment and Plan / ED Course  I have reviewed the triage vital signs and the nursing notes.  Pertinent labs & imaging results that were available during my care of the patient were reviewed by me and considered in my medical decision making (see chart for details).     Patient is  nontoxic, nonseptic appearing, in no apparent distress.  Patient's pain and other symptoms adequately managed in emergency department.  Fluid bolus given.  Labs, imaging and vitals reviewed.  Patient does not meet the SIRS or Sepsis criteria.  On repeat exam patient does not have a surgical abdomin and there are no peritoneal signs.  No indication of appendicitis, bowel obstruction, bowel perforation, cholecystitis, diverticulitis, PID or ectopic pregnancy.  Patient discharged home with symptomatic treatment and given strict instructions for follow-up with their primary care physician.  I suspect her pain may be secondary to constipation.  I have given her RX for miralax  which I suspect will help her pain.  She was informed that this may take a few days to work.  She was instructed not to drive or operate heavy machinery as she received possibly sedating medications.  I have also discussed reasons to return immediately to the ER.  Patient expresses understanding and agrees with plan.   Final Clinical Impressions(s) / ED Diagnoses   Final diagnoses:  Left lower quadrant pain  Generalized abdominal pain    New Prescriptions Discharge Medication List as of 03/22/2017  9:43 PM       Cristina Gong, PA-C 03/22/17 2248    Pricilla Loveless, MD 03/22/17 [phone]

## 2017-03-22 NOTE — ED Notes (Signed)
Pt returned to Room at this time

## 2017-05-14 ENCOUNTER — Emergency Department (HOSPITAL_COMMUNITY): Payer: Medicaid Other

## 2017-05-14 ENCOUNTER — Emergency Department (HOSPITAL_COMMUNITY)
Admission: EM | Admit: 2017-05-14 | Discharge: 2017-05-14 | Disposition: A | Discharge status: Home / Self Care | Payer: Medicaid Other | Attending: Emergency Medicine | Admitting: Emergency Medicine

## 2017-05-14 ENCOUNTER — Encounter (HOSPITAL_COMMUNITY): Payer: Self-pay | Admitting: *Deleted

## 2017-05-14 DIAGNOSIS — R0789 Other chest pain: Secondary | ICD-10-CM

## 2017-05-14 DIAGNOSIS — Z7982 Long term (current) use of aspirin: Secondary | ICD-10-CM | POA: Insufficient documentation

## 2017-05-14 DIAGNOSIS — R1013 Epigastric pain: Secondary | ICD-10-CM

## 2017-05-14 DIAGNOSIS — J449 Chronic obstructive pulmonary disease, unspecified: Secondary | ICD-10-CM | POA: Insufficient documentation

## 2017-05-14 DIAGNOSIS — Z79899 Other long term (current) drug therapy: Secondary | ICD-10-CM | POA: Insufficient documentation

## 2017-05-14 DIAGNOSIS — F17210 Nicotine dependence, cigarettes, uncomplicated: Secondary | ICD-10-CM | POA: Diagnosis not present

## 2017-05-14 DIAGNOSIS — Z9049 Acquired absence of other specified parts of digestive tract: Secondary | ICD-10-CM | POA: Diagnosis not present

## 2017-05-14 DIAGNOSIS — I10 Essential (primary) hypertension: Secondary | ICD-10-CM | POA: Diagnosis not present

## 2017-05-14 LAB — I-STAT TROPONIN, ED
Troponin i, poc: 0 ng/mL (ref 0.00–0.08)
Troponin i, poc: 0 ng/mL (ref 0.00–0.08)

## 2017-05-14 LAB — CBC
HEMATOCRIT: 36.8 % (ref 36.0–46.0)
HEMOGLOBIN: 12.6 g/dL (ref 12.0–15.0)
MCH: 32.1 pg (ref 26.0–34.0)
MCHC: 34.2 g/dL (ref 30.0–36.0)
MCV: 93.6 fL (ref 78.0–100.0)
Platelets: 213 10*3/uL (ref 150–400)
RBC: 3.93 MIL/uL (ref 3.87–5.11)
RDW: 12.7 % (ref 11.5–15.5)
WBC: 6.1 10*3/uL (ref 4.0–10.5)

## 2017-05-14 LAB — BASIC METABOLIC PANEL
ANION GAP: 7 (ref 5–15)
BUN: 12 mg/dL (ref 6–20)
CALCIUM: 8.3 mg/dL — AB (ref 8.9–10.3)
CO2: 27 mmol/L (ref 22–32)
Chloride: 99 mmol/L — ABNORMAL LOW (ref 101–111)
Creatinine, Ser: 0.82 mg/dL (ref 0.44–1.00)
GLUCOSE: 94 mg/dL (ref 65–99)
POTASSIUM: 3.8 mmol/L (ref 3.5–5.1)
Sodium: 133 mmol/L — ABNORMAL LOW (ref 135–145)

## 2017-05-14 LAB — HEPATIC FUNCTION PANEL
ALT: 36 U/L (ref 14–54)
AST: 30 U/L (ref 15–41)
Albumin: 3.4 g/dL — ABNORMAL LOW (ref 3.5–5.0)
Alkaline Phosphatase: 46 U/L (ref 38–126)
Bilirubin, Direct: <0.1 mg/dL — ABNORMAL LOW (ref 0.1–0.5)
Total Bilirubin: 0.4 mg/dL (ref 0.3–1.2)
Total Protein: 5.6 g/dL — ABNORMAL LOW (ref 6.5–8.1)

## 2017-05-14 MED ORDER — SODIUM CHLORIDE 0.9 % IV BOLUS (SEPSIS)
1000.0000 mL | Freq: Once | INTRAVENOUS | Status: AC
  Administered 2017-05-14: 1000 mL via INTRAVENOUS

## 2017-05-14 MED ORDER — HYDROMORPHONE HCL 1 MG/ML IJ SOLN
1.0000 mg | Freq: Once | INTRAMUSCULAR | Status: AC
  Administered 2017-05-14: 1 mg via INTRAVENOUS
  Filled 2017-05-14: qty 1

## 2017-05-14 MED ORDER — HALOPERIDOL LACTATE 5 MG/ML IJ SOLN
3.0000 mg | Freq: Once | INTRAMUSCULAR | Status: AC
  Administered 2017-05-14: 3 mg via INTRAVENOUS
  Filled 2017-05-14: qty 1

## 2017-05-14 MED ORDER — MORPHINE SULFATE (PF) 4 MG/ML IV SOLN
4.0000 mg | Freq: Once | INTRAVENOUS | Status: AC
  Administered 2017-05-14: 4 mg via INTRAVENOUS
  Filled 2017-05-14: qty 1

## 2017-05-14 MED ORDER — ONDANSETRON HCL 4 MG/2ML IJ SOLN
4.0000 mg | Freq: Once | INTRAMUSCULAR | Status: AC
  Administered 2017-05-14: 4 mg via INTRAVENOUS
  Filled 2017-05-14: qty 2

## 2017-05-14 MED ORDER — SUCRALFATE 1 GM/10ML PO SUSP
1.0000 g | Freq: Three times a day (TID) | ORAL | Status: DC
  Administered 2017-05-14: 1 g via ORAL
  Filled 2017-05-14 (×2): qty 10

## 2017-05-14 MED ORDER — PANTOPRAZOLE SODIUM 40 MG IV SOLR
40.0000 mg | Freq: Once | INTRAVENOUS | Status: AC
  Administered 2017-05-14: 40 mg via INTRAVENOUS
  Filled 2017-05-14: qty 40

## 2017-05-14 MED ORDER — GI COCKTAIL ~~LOC~~
30.0000 mL | Freq: Once | ORAL | Status: AC
  Administered 2017-05-14: 30 mL via ORAL
  Filled 2017-05-14: qty 30

## 2017-05-14 MED ORDER — IOPAMIDOL (ISOVUE-300) INJECTION 61%
INTRAVENOUS | Status: AC
  Administered 2017-05-14: 100 mL via INTRAVENOUS
  Filled 2017-05-14: qty 100

## 2017-05-14 NOTE — ED Notes (Signed)
Patient able to ambulate independently  

## 2017-05-14 NOTE — ED Notes (Signed)
Pt ambulated to the BR with steady gait, without difficulty.  Denied dizziness

## 2017-05-14 NOTE — ED Triage Notes (Signed)
Per EMS, pt from home, reports abd pain with n/v x 2 days ago, started to have L side cp today.  Reports using phenergan for the nausea without relief.  Pt rates pain at 10/10.  Received zofran 4mg , ASA 324mg  chewables, and nitro .4mg  SL x 2 which brought her pain down to 8/10. Pt is A&Ox 4.

## 2017-05-14 NOTE — Discharge Instructions (Signed)
Begin taking your Nexium twice daily for 1 week.  Please see your doctor in the next few days for recheck.  Please return to the emergency department if you develop any new or worsening symptoms.

## 2017-05-14 NOTE — ED Provider Notes (Signed)
[facility] EMERGENCY DEPARTMENT Provider Note   CSN: [id number] Arrival date & time: 05/14/17  1544     History   Chief Complaint Chief Complaint  Patient presents with  . Chest Pain  . Abdominal Pain    HPI Katelyn [name] is a 56 y.o. female with history of COPD, hiatal hernia, GERD, hypertension, smoker who presents with a 2-day history of epigastric, left upper quadrant, and left lower quadrant pain.  Patient then reported that her chest started hurting today.  She describes it as a sharp, intermittent pain.   her pain does not radiate.  It has not responded to aspirin and nitroglycerin taken at home.  The nitroglycerin was not hers.  She denies any cough or shortness of breath, fevers, vomiting, urinary symptoms, back pain.  She has been taking Phenergan at home without relief of her nausea.  Her last bowel movement was 2 days ago.  Patient denies any first-degree relative cardiac history.  HPI  Past Medical History:  Diagnosis Date  . COPD (chronic obstructive pulmonary disease) (HCC)   . GERD (gastroesophageal reflux disease)   . Hiatal hernia   . HLD (hyperlipidemia)   . Hypertension   . Smoking     Patient Active Problem List   Diagnosis Date Noted  . Chest pain 01/27/2014  . HTN (hypertension) 01/27/2014  . HLD (hyperlipidemia) 01/27/2014    Past Surgical History:  Procedure Laterality Date  . ABDOMINAL HYSTERECTOMY    . ADENOIDECTOMY    . CHOLECYSTECTOMY    . HERNIA REPAIR    . NECK SURGERY    . SHOULDER SURGERY    . TONSILLECTOMY      OB History    No data available       Home Medications    Prior to Admission medications   Medication Sig Start Date End Date Taking? Authorizing Provider  ADVAIR DISKUS 250-50 MCG/DOSE AEPB INHALE 1 PUFF INTO THE LUNGS TWICE A DAY as needed for shortness of breath 11/17/14  Yes [provider]  albuterol (PROVENTIL HFA;VENTOLIN HFA) 108 (90 BASE) MCG/ACT inhaler Inhale 2 puffs into  the lungs every 6 (six) hours as needed for wheezing or shortness of breath. 01/27/14  Yes Buriev, Isaiah Serge, MD  albuterol (PROVENTIL) (2.5 MG/3ML) 0.083% nebulizer solution Take 3 mLs (2.5 mg total) by nebulization every 6 (six) hours as needed for wheezing or shortness of breath. 01/27/14  Yes Buriev, Isaiah Serge, MD  amLODipine (NORVASC) 5 MG tablet Take 1 tablet (5 mg total) by mouth daily. 01/27/14  Yes Esperanza Sheets, MD  aspirin EC 81 MG tablet Take 1 tablet (81 mg total) by mouth daily. 01/27/14  Yes Buriev, Isaiah Serge, MD  atorvastatin (LIPITOR) 40 MG tablet Take 40 mg by mouth daily. 01/01/15  Yes [provider]  benazepril-hydrochlorthiazide (LOTENSIN HCT) 20-25 MG per tablet Take 1 tablet by mouth daily. Patient taking differently: Take 0.5 tablets by mouth daily.  01/27/14  Yes Esperanza Sheets, MD  Cholecalciferol (VITAMIN D3) 5000 UNITS CAPS Take 1 capsule by mouth See admin instructions. Take on Monday, Wednesday, and Friday   Yes [provider]  esomeprazole (NEXIUM) 40 MG capsule Take 40 mg by mouth daily at 12 noon.   Yes [provider]  hydrOXYzine (VISTARIL) 50 MG capsule Take 50 mg by mouth 2 (two) times daily as needed for anxiety.   Yes [provider]  Linaclotide (LINZESS) 290 MCG CAPS capsule Take 1 capsule (290 mcg  total) by mouth daily. 01/27/14  Yes Buriev, Isaiah Serge, MD  promethazine (PHENERGAN) 25 MG tablet Take 25 mg by mouth every 6 (six) hours as needed for nausea or vomiting.   Yes [provider]  SUMAtriptan (IMITREX) 100 MG tablet Take 1 tablet (100 mg total) by mouth every 2 (two) hours as needed for migraine. May repeat in 2 hours if headache persists or recurs. 01/27/14  Yes Esperanza Sheets, MD  traZODone (DESYREL) 150 MG tablet Take 300 mg by mouth at bedtime.  12/07/14  Yes [provider]  tiotropium (SPIRIVA) 18 MCG inhalation capsule Place 1 capsule (18 mcg total) into inhaler and inhale daily. Patient  not taking: Reported on 05/14/2017 01/27/14   Esperanza Sheets, MD    Family History Family History  Problem Relation Age of Onset  . CAD Father        onset age 44    Social History Social History   Tobacco Use  . Smoking status: Current Every Day Smoker    Packs/day: 0.50    Types: Cigarettes  . Smokeless tobacco: Never Used  Substance Use Topics  . Alcohol use: No  . Drug use: No     Allergies   Ciprofloxacin; Macrobid [nitrofurantoin monohyd macro]; and Sulfa antibiotics   Review of Systems Review of Systems  Constitutional: Negative for chills and fever.  HENT: Negative for facial swelling and sore throat.   Respiratory: Negative for shortness of breath.   Cardiovascular: Positive for chest pain.  Gastrointestinal: Positive for abdominal pain and nausea. Negative for blood in stool, diarrhea and vomiting.  Genitourinary: Negative for dysuria and frequency.  Musculoskeletal: Negative for back pain.  Skin: Negative for rash and wound.  Neurological: Negative for headaches.  Psychiatric/Behavioral: The patient is not nervous/anxious.      Physical Exam Updated Vital Signs BP 109/65   Pulse 74   Temp 98.4 F (36.9 C) (Oral)   Resp (!) 25   Ht 5\' 4"  (1.626 m)   Wt 65.8 kg (145 lb)   SpO2 98%   BMI 24.89 kg/m   Physical Exam  Constitutional: She appears well-developed and well-nourished. No distress.  HENT:  Head: Normocephalic and atraumatic.  Mouth/Throat: Oropharynx is clear and moist. No oropharyngeal exudate.  Eyes: Conjunctivae are normal. Pupils are equal, round, and reactive to light. Right eye exhibits no discharge. Left eye exhibits no discharge. No scleral icterus.  Neck: Normal range of motion. Neck supple. No thyromegaly present.  Cardiovascular: Normal rate, regular rhythm, normal heart sounds and intact distal pulses. Exam reveals no gallop and no friction rub.  No murmur heard. Pulses:      Radial pulses are 2+ on the right side, and 2+  on the left side.       Dorsalis pedis pulses are 2+ on the right side, and 2+ on the left side.  Blood pressures equal bilaterally  Pulmonary/Chest: Effort normal and breath sounds normal. No stridor. No respiratory distress. She has no wheezes. She has no rales. She exhibits no tenderness.  Abdominal: Soft. Bowel sounds are normal. She exhibits no distension. There is tenderness in the epigastric area, left upper quadrant and left lower quadrant. There is no rebound and no guarding.  Musculoskeletal: She exhibits no edema.  Lymphadenopathy:    She has no cervical adenopathy.  Neurological: She is alert. Coordination normal.  Skin: Skin is warm and dry. No rash noted. She is not diaphoretic. No pallor.  Psychiatric: She has a normal  mood and affect.  Nursing note and vitals reviewed.    ED Treatments / Results  Labs (all labs ordered are listed, but only abnormal results are displayed) Labs Reviewed  BASIC METABOLIC PANEL - Abnormal; Notable for the following components:      Result Value   Sodium 133 (*)    Chloride 99 (*)    Calcium 8.3 (*)    All other components within normal limits  HEPATIC FUNCTION PANEL - Abnormal; Notable for the following components:   Total Protein 5.6 (*)    Albumin 3.4 (*)    Bilirubin, Direct <0.1 (*)    All other components within normal limits  CBC  I-STAT TROPONIN, ED  I-STAT TROPONIN, ED    EKG  EKG Interpretation  Date/Time:  Sunday May 14 2017 16:07:52 EST Ventricular Rate:  65 PR Interval:    QRS Duration: 99 QT Interval:  430 QTC Calculation: 448 R Axis:   25 Text Interpretation:  Sinus rhythm Abnormal R-wave progression, early transition No significant change since last tracing Confirmed by Alvira MondaySchlossman, Erin ([id number]) on 05/14/2017 7:57:54 PM       Radiology Dg Chest 2 View  Result Date: 05/14/2017 CLINICAL DATA:  56 y/o F; abdominal pain with nausea and vomiting for 2 days. Left-sided chest pain today. EXAM: CHEST  2  VIEW COMPARISON:  01/26/2014 chest radiograph FINDINGS: Normal cardiac silhouette. Anterior posterior cervical fusion hardware partially visualized. Clear lungs. No pleural effusion or pneumothorax. No acute osseous abnormality is evident. IMPRESSION: No acute pulmonary process identified. Electronically Signed   By: Mitzi HansenLance  Furusawa-Stratton M.D.   On: 05/14/2017 16:42    Procedures Procedures (including critical care time)  Medications Ordered in ED Medications  sucralfate (CARAFATE) 1 GM/10ML suspension 1 g (1 g Oral Given 05/14/17 2314)  sodium chloride 0.9 % bolus 1,000 mL (0 mLs Intravenous Stopped 05/14/17 1941)  pantoprazole (PROTONIX) injection 40 mg (40 mg Intravenous Given 05/14/17 1719)  ondansetron (ZOFRAN) injection 4 mg (4 mg Intravenous Given 05/14/17 1718)  morphine 4 MG/ML injection 4 mg (4 mg Intravenous Given 05/14/17 1719)  iopamidol (ISOVUE-300) 61 % injection (100 mLs Intravenous Contrast Given 05/14/17 1921)  HYDROmorphone (DILAUDID) injection 1 mg (1 mg Intravenous Given 05/14/17 1902)  haloperidol lactate (HALDOL) injection 3 mg (3 mg Intravenous Given 05/14/17 2024)  gi cocktail (Maalox,Lidocaine,Donnatal) (30 mLs Oral Given 05/14/17 2024)     Initial Impression / Assessment and Plan / ED Course  I have reviewed the triage vital signs and the nursing notes.  Pertinent labs & imaging results that were available during my care of the patient were reviewed by me and considered in my medical decision making (see chart for details).  Clinical Course as of May 15 2335  Wynelle LinkSun May 14, 2017  2015 Patient reporting no significant improvement after morphine and Dilaudid.  We will give Haldol and GI cocktail.  [AL]  2203 CT abdomen pelvis is negative.  Patient asking to eat and drink.  Will proceed with PO challenge.  And order delta troponin.  [AL]    Clinical Course User Index [AL] Emi HolesLaw, Hoyt Leanos M, PA-C    Patient with abdominal pain, nausea, and chest pain.  Delta  troponin is negative.  Labs are unremarkable.  Chest x-ray is negative.  CT abdomen pelvis shows stable intra-and extrahepatic biliary dilatation postcholecystectomy, likely due to postsurgical change and negative for bowel obstruction or bowel wall thickening- did not cross over to Epic.  Heart score is 3 and I feel patient's  symptoms more likely related to GERD.  Patient with equal pulses in all 4 extremities and equal blood pressures bilaterally.  Low suspicion for dissection. Wells' for PE is negative.  Patient's pain waxed and waned throughout ED course, but without any significant overall improvement.  However, patient need an entire meal and drank 2 sodas without difficulty.  Will discharge patient home with twice daily Nexium for the next week and follow-up to PCP for further evaluation and treatment.  Patient given strict return precautions.  Patient understands and agrees with plan.  Patient vitals stable throughout ED course and discharged in satisfactory condition.  Patient also evaluated by Dr. Dalene SeltzerSchlossman who agrees with plan.  Final Clinical Impressions(s) / ED Diagnoses   Final diagnoses:  Epigastric pain  Atypical chest pain    ED Discharge Orders    None       Emi HolesLaw, Chuong Casebeer M, PA-C 05/14/17 2336    Alvira MondaySchlossman, Erin, MD 05/18/17 [phone]

## 2017-05-14 NOTE — ED Notes (Signed)
EDP at bedside  

## 2017-06-08 ENCOUNTER — Emergency Department
Admission: EM | Admit: 2017-06-08 | Discharge: 2017-06-08 | Disposition: A | Discharge status: Home / Self Care | Payer: Medicaid Other | Attending: Emergency Medicine | Admitting: Emergency Medicine

## 2017-06-08 ENCOUNTER — Other Ambulatory Visit: Payer: Self-pay

## 2017-06-08 ENCOUNTER — Emergency Department: Payer: Medicaid Other

## 2017-06-08 ENCOUNTER — Encounter: Payer: Self-pay | Admitting: Emergency Medicine

## 2017-06-08 DIAGNOSIS — N3289 Other specified disorders of bladder: Secondary | ICD-10-CM | POA: Diagnosis present

## 2017-06-08 DIAGNOSIS — F17210 Nicotine dependence, cigarettes, uncomplicated: Secondary | ICD-10-CM | POA: Diagnosis not present

## 2017-06-08 DIAGNOSIS — I10 Essential (primary) hypertension: Secondary | ICD-10-CM | POA: Diagnosis not present

## 2017-06-08 DIAGNOSIS — Z7982 Long term (current) use of aspirin: Secondary | ICD-10-CM | POA: Diagnosis not present

## 2017-06-08 DIAGNOSIS — Z79899 Other long term (current) drug therapy: Secondary | ICD-10-CM | POA: Diagnosis not present

## 2017-06-08 DIAGNOSIS — J449 Chronic obstructive pulmonary disease, unspecified: Secondary | ICD-10-CM | POA: Diagnosis not present

## 2017-06-08 DIAGNOSIS — R102 Pelvic and perineal pain: Secondary | ICD-10-CM | POA: Diagnosis not present

## 2017-06-08 LAB — COMPREHENSIVE METABOLIC PANEL
ALT: 22 U/L (ref 14–54)
AST: 23 U/L (ref 15–41)
Albumin: 3.9 g/dL (ref 3.5–5.0)
Alkaline Phosphatase: 55 U/L (ref 38–126)
Anion gap: 5 (ref 5–15)
BILIRUBIN TOTAL: 0.4 mg/dL (ref 0.3–1.2)
BUN: 8 mg/dL (ref 6–20)
CALCIUM: 8.9 mg/dL (ref 8.9–10.3)
CHLORIDE: 98 mmol/L — AB (ref 101–111)
CO2: 30 mmol/L (ref 22–32)
CREATININE: 0.78 mg/dL (ref 0.44–1.00)
Glucose, Bld: 95 mg/dL (ref 65–99)
Potassium: 4.1 mmol/L (ref 3.5–5.1)
Sodium: 133 mmol/L — ABNORMAL LOW (ref 135–145)
TOTAL PROTEIN: 6.3 g/dL — AB (ref 6.5–8.1)

## 2017-06-08 LAB — URINALYSIS, COMPLETE (UACMP) WITH MICROSCOPIC
BILIRUBIN URINE: NEGATIVE
Bacteria, UA: NONE SEEN
GLUCOSE, UA: NEGATIVE mg/dL
KETONES UR: NEGATIVE mg/dL
LEUKOCYTES UA: NEGATIVE
NITRITE: NEGATIVE
PROTEIN: NEGATIVE mg/dL
Specific Gravity, Urine: 1.004 — ABNORMAL LOW (ref 1.005–1.030)
pH: 7 (ref 5.0–8.0)

## 2017-06-08 LAB — CBC
HCT: 40.1 % (ref 35.0–47.0)
Hemoglobin: 13.4 g/dL (ref 12.0–16.0)
MCH: 31.6 pg (ref 26.0–34.0)
MCHC: 33.5 g/dL (ref 32.0–36.0)
MCV: 94.5 fL (ref 80.0–100.0)
PLATELETS: 208 10*3/uL (ref 150–440)
RBC: 4.25 MIL/uL (ref 3.80–5.20)
RDW: 13 % (ref 11.5–14.5)
WBC: 4.6 10*3/uL (ref 3.6–11.0)

## 2017-06-08 LAB — LIPASE, BLOOD: LIPASE: 26 U/L (ref 11–51)

## 2017-06-08 MED ORDER — KETOROLAC TROMETHAMINE 60 MG/2ML IM SOLN
15.0000 mg | Freq: Once | INTRAMUSCULAR | Status: AC
  Administered 2017-06-08: 15 mg via INTRAMUSCULAR
  Filled 2017-06-08: qty 2

## 2017-06-08 MED ORDER — PHENAZOPYRIDINE HCL 200 MG PO TABS: 200.0000 mg | ORAL_TABLET | Freq: Three times a day (TID) | ORAL | 0 refills | Status: DC | PRN

## 2017-06-08 MED ORDER — DICYCLOMINE HCL 10 MG PO CAPS
10.0000 mg | ORAL_CAPSULE | Freq: Once | ORAL | Status: AC
  Administered 2017-06-08: 10 mg via ORAL
  Filled 2017-06-08: qty 1

## 2017-06-08 MED ORDER — OXYCODONE-ACETAMINOPHEN 5-325 MG PO TABS
1.0000 | ORAL_TABLET | Freq: Once | ORAL | Status: AC
  Administered 2017-06-08: 1 via ORAL
  Filled 2017-06-08: qty 1

## 2017-06-08 MED ORDER — PHENAZOPYRIDINE HCL 200 MG PO TABS
200.0000 mg | ORAL_TABLET | Freq: Once | ORAL | Status: AC
Start: 2017-06-08 — End: 2017-06-08
  Administered 2017-06-08: 200 mg via ORAL
  Filled 2017-06-08 (×2): qty 1

## 2017-06-08 MED ORDER — ONDANSETRON 4 MG PO TBDP
8.0000 mg | ORAL_TABLET | ORAL | Status: AC
  Administered 2017-06-08: 8 mg via ORAL
  Filled 2017-06-08: qty 2

## 2017-06-08 MED ORDER — DICYCLOMINE HCL 20 MG PO TABS: 20.0000 mg | ORAL_TABLET | Freq: Three times a day (TID) | ORAL | 0 refills | Status: DC | PRN

## 2017-06-08 NOTE — ED Provider Notes (Signed)
[facility] Emergency Department Provider Note  ____________________________________________  Time seen: Approximately 8:10 PM  I have reviewed the triage vital signs and the nursing notes.   HISTORY  Chief Complaint Abdominal Pain    HPI Katelyn [name] is a 56 y.o. female who complains of bladder spasms for the past 2 days. She saw her PCP and was given Augmentin for a urinary tract infection. She feels like these medications have not helped her pain so far. She describes her bladder spasms as 10 out of 10. Constant colicky waxing and waning pain in the suprapubic area. Nonradiating, no aggravating or alleviating factors.     Past Medical History:  Diagnosis Date  . COPD (chronic obstructive pulmonary disease) (HCC)   . GERD (gastroesophageal reflux disease)   . Hiatal hernia   . HLD (hyperlipidemia)   . Hypertension   . Smoking      Patient Active Problem List   Diagnosis Date Noted  . Chest pain 01/27/2014  . HTN (hypertension) 01/27/2014  . HLD (hyperlipidemia) 01/27/2014     Past Surgical History:  Procedure Laterality Date  . ABDOMINAL HYSTERECTOMY    . ADENOIDECTOMY    . CHOLECYSTECTOMY    . HERNIA REPAIR    . NECK SURGERY    . SHOULDER SURGERY    . TONSILLECTOMY       Prior to Admission medications   Medication Sig Start Date End Date Taking? Authorizing Provider  ADVAIR DISKUS 250-50 MCG/DOSE AEPB INHALE 1 PUFF INTO THE LUNGS TWICE A DAY as needed for shortness of breath 11/17/14   [provider]  albuterol (PROVENTIL HFA;VENTOLIN HFA) 108 (90 BASE) MCG/ACT inhaler Inhale 2 puffs into the lungs every 6 (six) hours as needed for wheezing or shortness of breath. 01/27/14   Esperanza SheetsBuriev, Ulugbek N, MD  albuterol (PROVENTIL) (2.5 MG/3ML) 0.083% nebulizer solution Take 3 mLs (2.5 mg total) by nebulization every 6 (six) hours as needed for wheezing or shortness of breath. 01/27/14   Esperanza SheetsBuriev, Ulugbek N, MD  amLODipine (NORVASC) 5 MG  tablet Take 1 tablet (5 mg total) by mouth daily. 01/27/14   Esperanza SheetsBuriev, Ulugbek N, MD  aspirin EC 81 MG tablet Take 1 tablet (81 mg total) by mouth daily. 01/27/14   Esperanza SheetsBuriev, Ulugbek N, MD  atorvastatin (LIPITOR) 40 MG tablet Take 40 mg by mouth daily. 01/01/15   [provider]  benazepril-hydrochlorthiazide (LOTENSIN HCT) 20-25 MG per tablet Take 1 tablet by mouth daily. Patient taking differently: Take 0.5 tablets by mouth daily.  01/27/14   Esperanza SheetsBuriev, Ulugbek N, MD  Cholecalciferol (VITAMIN D3) 5000 UNITS CAPS Take 1 capsule by mouth See admin instructions. Take on Monday, Wednesday, and Friday    [provider]  dicyclomine (BENTYL) 20 MG tablet Take 1 tablet (20 mg total) by mouth 3 (three) times daily as needed for spasms. 06/08/17   Sharman CheekStafford, Lucius Wise, MD  esomeprazole (NEXIUM) 40 MG capsule Take 40 mg by mouth daily at 12 noon.    [provider]  hydrOXYzine (VISTARIL) 50 MG capsule Take 50 mg by mouth 2 (two) times daily as needed for anxiety.    [provider]  Linaclotide Karlene Einstein(LINZESS) 290 MCG CAPS capsule Take 1 capsule (290 mcg total) by mouth daily. 01/27/14   Esperanza SheetsBuriev, Ulugbek N, MD  phenazopyridine (PYRIDIUM) 200 MG tablet Take 1 tablet (200 mg total) by mouth 3 (three) times daily as needed for pain. 06/08/17   Sharman CheekStafford, Beckam Abdulaziz, MD  promethazine (PHENERGAN) 25 MG tablet Take  25 mg by mouth every 6 (six) hours as needed for nausea or vomiting.    [provider]  SUMAtriptan (IMITREX) 100 MG tablet Take 1 tablet (100 mg total) by mouth every 2 (two) hours as needed for migraine. May repeat in 2 hours if headache persists or recurs. 01/27/14   Esperanza Sheets, MD  tiotropium (SPIRIVA) 18 MCG inhalation capsule Place 1 capsule (18 mcg total) into inhaler and inhale daily. Patient not taking: Reported on 05/14/2017 01/27/14   Esperanza Sheets, MD  traZODone (DESYREL) 150 MG tablet Take 300 mg by mouth at bedtime.  12/07/14   [provider]      Allergies Ciprofloxacin; Macrobid [nitrofurantoin monohyd macro]; and Sulfa antibiotics   Family History  Problem Relation Age of Onset  . CAD Father        onset age 65    Social History Social History   Tobacco Use  . Smoking status: Current Every Day Smoker    Packs/day: 0.50    Types: Cigarettes  . Smokeless tobacco: Never Used  Substance Use Topics  . Alcohol use: No  . Drug use: No    Review of Systems  Constitutional:   No fever or chills.  ENT:   No sore throat. No rhinorrhea. Cardiovascular:   No chest pain or syncope. Respiratory:   No dyspnea or cough. Gastrointestinal:  Positive as above for suprapubic pain without vomiting diarrhea or constipation  Musculoskeletal:   Negative for focal pain or swelling All other systems reviewed and are negative except as documented above in ROS and HPI.  ____________________________________________   PHYSICAL EXAM:  VITAL SIGNS: ED Triage Vitals  Enc Vitals Group     BP 06/08/17 1419 122/76     Pulse Rate 06/08/17 1419 64     Resp 06/08/17 1419 16     Temp 06/08/17 1419 98.9 F (37.2 C)     Temp Source 06/08/17 1419 Oral     SpO2 06/08/17 1419 100 %     Weight 06/08/17 1420 155 lb (70.3 kg)     Height 06/08/17 1420 5\' 4"  (1.626 m)     Head Circumference --      Peak Flow --      Pain Score 06/08/17 1419 10     Pain Loc --      Pain Edu? --      Excl. in GC? --     Vital signs reviewed, nursing assessments reviewed.   Constitutional:   Alert and oriented. Well appearing and in no distress. Sitting upright in bedside chair, calm and comfortable Eyes:   No scleral icterus.  EOMI. No nystagmus. No conjunctival pallor. PERRL. ENT   Head:   Normocephalic and atraumatic.   Nose:   No congestion/rhinnorhea.    Mouth/Throat:   MMM, no pharyngeal erythema. No peritonsillar mass.    Neck:   No meningismus. Full ROM. Hematological/Lymphatic/Immunilogical:   No cervical  lymphadenopathy. Cardiovascular:   RRR. Symmetric bilateral radial and DP pulses.  No murmurs.  Respiratory:   Normal respiratory effort without tachypnea/retractions. Breath sounds are clear and equal bilaterally. No wheezes/rales/rhonchi. Gastrointestinal:   Soft with mild left lower quadrant tenderness. Non distended. There is no CVA tenderness.  No rebound, rigidity, or guarding. Genitourinary:   deferred Musculoskeletal:   Normal range of motion in all extremities. No joint effusions.  No lower extremity tenderness.  No edema. Neurologic:   Normal speech and language.  Motor grossly intact. No gross focal neurologic  deficits are appreciated.  Skin:    Skin is warm, dry and intact. No rash noted.  No petechiae, purpura, or bullae.  ____________________________________________    LABS (pertinent positives/negatives) (all labs ordered are listed, but only abnormal results are displayed) Labs Reviewed  COMPREHENSIVE METABOLIC PANEL - Abnormal; Notable for the following components:      Result Value   Sodium 133 (*)    Chloride 98 (*)    Total Protein 6.3 (*)    All other components within normal limits  URINALYSIS, COMPLETE (UACMP) WITH MICROSCOPIC - Abnormal; Notable for the following components:   Color, Urine STRAW (*)    APPearance CLEAR (*)    Specific Gravity, Urine 1.004 (*)    Hgb urine dipstick SMALL (*)    Squamous Epithelial / LPF 0-5 (*)    All other components within normal limits  LIPASE, BLOOD  CBC   ____________________________________________   EKG    ____________________________________________    RADIOLOGY  Ct Renal Stone Study  Result Date: 06/08/2017 CLINICAL DATA:  Left flank and bladder pain. EXAM: CT ABDOMEN AND PELVIS WITHOUT CONTRAST TECHNIQUE: Multidetector CT imaging of the abdomen and pelvis was performed following the standard protocol without IV contrast. COMPARISON:  CT abdomen pelvis dated May 14, 2017. FINDINGS: Lower chest: No  acute abnormality.  Emphysematous changes. Hepatobiliary: Known left hepatic lobe hemangioma is not well seen without intravenous contrast. Status post cholecystectomy. No biliary dilatation. Pancreas: Unremarkable. No pancreatic ductal dilatation or surrounding inflammatory changes. Spleen: Normal in size without focal abnormality. Adrenals/Urinary Tract: Adrenal glands are unremarkable. Kidneys are normal, without renal calculi, focal lesion, or hydronephrosis. Bladder is unremarkable. Stomach/Bowel: Small hiatal hernia. Stomach is otherwise within normal limits. Appendix is normal. No evidence of bowel wall thickening, distention, or surrounding inflammatory changes. Moderate colonic stool burden. Vascular/Lymphatic: Aortic atherosclerosis. No enlarged abdominal or pelvic lymph nodes. Reproductive: Status post hysterectomy. No adnexal masses. Other: No free fluid or pneumoperitoneum. Musculoskeletal: No acute or significant osseous findings. Stable grade 1 anterolisthesis at L4-L5. IMPRESSION: 1.  No acute intra-abdominal process.  No obstructive uropathy. 2.  Aortic atherosclerosis (ICD10-I70.0). Electronically Signed   By: Obie DredgeWilliam T Derry M.D.   On: 06/08/2017 18:42    ____________________________________________   PROCEDURES Procedures  ____________________________________________   DIFFERENTIAL DIAGNOSIS  Diverticulitis, ureterolithiasis, cystitis  CLINICAL IMPRESSION / ASSESSMENT AND PLAN / ED COURSE  Pertinent labs & imaging results that were available during my care of the patient were reviewed by me and considered in my medical decision making (see chart for details).   Patient well-appearing no acute distress unremarkable vital signs, presents with suprapubic pain. Exam suggests the pain is more left lower quadrant. Most likely this is cystitis that has improvement in the UA due to 2 days of antibiotics, but no resolution of the inflammatory reaction yet causing persistent symptoms.  With exam suggesting more left lower quadrant source, obtain a CT scan which was negative for diverticulitis or obstructing stone. Patient's ambulatory, calm and comfortable and suitable for discharge home. She requests opioids for her ladder spasm pain. Give her one time oral dose in the ED but no prescription. Recommend that she try Pyridium and I'll give her prescription for Bentyl as well. Follow up with her primary care doctor. Continue Augmentin.  ____________________________________________   FINAL CLINICAL IMPRESSION(S) / ED DIAGNOSES    Final diagnoses:  Suprapubic pain      This SmartLink is deprecated. Use AVSMEDLIST instead to display the medication list for a patient.  Portions of this note were generated with dragon dictation software. Dictation errors may occur despite best attempts at proofreading.    Sharman Cheek, MD 06/08/17 2013

## 2017-06-08 NOTE — ED Triage Notes (Signed)
Pt to ED via POV c/o bladder spasms since Tuesday. Pt was seen by her PCP and was given Augmentin and vesicare. Pt states that the medication are not helping with her pain. Pt states that she is still having 10/10 pain. Pt denies fever but states that she had had chills. Pt in NAD at this time.

## 2017-06-09 ENCOUNTER — Emergency Department
Admission: EM | Admit: 2017-06-09 | Discharge: 2017-06-09 | Disposition: A | Discharge status: Home / Self Care | Payer: Medicaid Other | Attending: Emergency Medicine | Admitting: Emergency Medicine

## 2017-06-09 ENCOUNTER — Emergency Department
Admission: EM | Admit: 2017-06-09 | Discharge: 2017-06-09 | Disposition: A | Discharge status: Home / Self Care | Payer: Medicaid Other | Source: Home / Self Care | Attending: Emergency Medicine | Admitting: Emergency Medicine

## 2017-06-09 DIAGNOSIS — F17210 Nicotine dependence, cigarettes, uncomplicated: Secondary | ICD-10-CM | POA: Insufficient documentation

## 2017-06-09 DIAGNOSIS — J449 Chronic obstructive pulmonary disease, unspecified: Secondary | ICD-10-CM | POA: Insufficient documentation

## 2017-06-09 DIAGNOSIS — R339 Retention of urine, unspecified: Secondary | ICD-10-CM

## 2017-06-09 DIAGNOSIS — I10 Essential (primary) hypertension: Secondary | ICD-10-CM | POA: Insufficient documentation

## 2017-06-09 DIAGNOSIS — R1030 Lower abdominal pain, unspecified: Secondary | ICD-10-CM

## 2017-06-09 DIAGNOSIS — E785 Hyperlipidemia, unspecified: Secondary | ICD-10-CM | POA: Diagnosis not present

## 2017-06-09 DIAGNOSIS — Z7982 Long term (current) use of aspirin: Secondary | ICD-10-CM | POA: Diagnosis not present

## 2017-06-09 DIAGNOSIS — Z79899 Other long term (current) drug therapy: Secondary | ICD-10-CM | POA: Diagnosis not present

## 2017-06-09 DIAGNOSIS — R300 Dysuria: Secondary | ICD-10-CM | POA: Diagnosis present

## 2017-06-09 LAB — URINALYSIS, COMPLETE (UACMP) WITH MICROSCOPIC
Bacteria, UA: NONE SEEN
Bilirubin Urine: NEGATIVE
GLUCOSE, UA: NEGATIVE mg/dL
Ketones, ur: NEGATIVE mg/dL
Nitrite: NEGATIVE
PH: 7 (ref 5.0–8.0)
PROTEIN: NEGATIVE mg/dL
SPECIFIC GRAVITY, URINE: 1.005 (ref 1.005–1.030)

## 2017-06-09 MED ORDER — HYDROCODONE-ACETAMINOPHEN 5-325 MG PO TABS: 1.0000 | ORAL_TABLET | ORAL | 0 refills | Status: DC | PRN

## 2017-06-09 MED ORDER — HYDROCODONE-ACETAMINOPHEN 5-325 MG PO TABS
1.0000 | ORAL_TABLET | Freq: Once | ORAL | Status: AC
  Administered 2017-06-09: 1 via ORAL
  Filled 2017-06-09: qty 1

## 2017-06-09 MED ORDER — PHENAZOPYRIDINE HCL 200 MG PO TABS
200.0000 mg | ORAL_TABLET | Freq: Once | ORAL | Status: AC
Start: 2017-06-09 — End: 2017-06-09
  Administered 2017-06-09: 200 mg via ORAL
  Filled 2017-06-09 (×2): qty 1

## 2017-06-09 MED ORDER — PHENAZOPYRIDINE HCL 200 MG PO TABS: 200.0000 mg | ORAL_TABLET | Freq: Three times a day (TID) | ORAL | 0 refills | Status: DC | PRN

## 2017-06-09 MED ORDER — OXYCODONE-ACETAMINOPHEN 5-325 MG PO TABS
2.0000 | ORAL_TABLET | Freq: Once | ORAL | Status: AC
  Administered 2017-06-09: 2 via ORAL
  Filled 2017-06-09: qty 2

## 2017-06-09 NOTE — ED Triage Notes (Signed)
Pt to Ed via pov c/o unable to empty bladder. Seen yesterday, ct and blood work done. Pt reports still unable to use bathroom.

## 2017-06-09 NOTE — ED Notes (Signed)
Per bladder can >37200mL of urine in bladder. MD aware

## 2017-06-09 NOTE — ED Provider Notes (Signed)
[facility] Emergency Department Provider Note       Time seen: ----------------------------------------- 2:53 PM on 06/09/2017 -----------------------------------------   I have reviewed the triage vital signs and the nursing notes.  HISTORY   Chief Complaint Dysuria    HPI Katelyn [name] is a 56 y.o. female with a history of COPD, GERD, hypertension who presents to the ED for difficulty urinating.  Patient arrives via private vehicle complaining of urinary retention and polyuria.  She was seen yesterday and had CT and blood work done which was unremarkable.  Patient reports she is still unable to use the bathroom and she feels like she needs to urinate.  She denies fevers, chills or other complaints.  Past Medical History:  Diagnosis Date  . COPD (chronic obstructive pulmonary disease) (HCC)   . GERD (gastroesophageal reflux disease)   . Hiatal hernia   . HLD (hyperlipidemia)   . Hypertension   . Smoking     Patient Active Problem List   Diagnosis Date Noted  . Chest pain 01/27/2014  . HTN (hypertension) 01/27/2014  . HLD (hyperlipidemia) 01/27/2014    Past Surgical History:  Procedure Laterality Date  . ABDOMINAL HYSTERECTOMY    . ADENOIDECTOMY    . CHOLECYSTECTOMY    . HERNIA REPAIR    . NECK SURGERY    . SHOULDER SURGERY    . TONSILLECTOMY      Allergies Ciprofloxacin; Macrobid [nitrofurantoin monohyd macro]; and Sulfa antibiotics  Social History Social History   Tobacco Use  . Smoking status: Current Every Day Smoker    Packs/day: 0.50    Types: Cigarettes  . Smokeless tobacco: Never Used  Substance Use Topics  . Alcohol use: No  . Drug use: No    Review of Systems Constitutional: Negative for fever. Cardiovascular: Negative for chest pain. Respiratory: Negative for shortness of breath. Gastrointestinal: Negative for abdominal pain, vomiting and diarrhea. Genitourinary: Positive for dysuria,  polyuria Musculoskeletal: Negative for back pain. Skin: Negative for rash. Neurological: Negative for headaches, focal weakness or numbness.  All systems negative/normal/unremarkable except as stated in the HPI  ____________________________________________   PHYSICAL EXAM:  VITAL SIGNS: ED Triage Vitals  Enc Vitals Group     BP 06/09/17 1237 104/69     Pulse Rate 06/09/17 1237 71     Resp 06/09/17 1237 16     Temp 06/09/17 1237 97.7 F (36.5 C)     Temp Source 06/09/17 1237 Oral     SpO2 06/09/17 1237 100 %     Weight 06/09/17 1234 155 lb (70.3 kg)     Height 06/09/17 1234 5\' 4"  (1.626 m)     Head Circumference --      Peak Flow --      Pain Score --      Pain Loc --      Pain Edu? --      Excl. in GC? --     Constitutional: Alert and oriented. Well appearing and in no distress. Eyes: Conjunctivae are normal. Normal extraocular movements. Cardiovascular: Normal rate, regular rhythm. No murmurs, rubs, or gallops. Respiratory: Normal respiratory effort without tachypnea nor retractions. Breath sounds are clear and equal bilaterally. No wheezes/rales/rhonchi. Gastrointestinal: Mild suprapubic tenderness, normal bowel sounds. Musculoskeletal: Nontender with normal range of motion in extremities. No lower extremity tenderness nor edema. Neurologic:  Normal speech and language. No gross focal neurologic deficits are appreciated.  Skin:  Skin is warm, dry and intact. No rash noted. Psychiatric: Depressed mood and  affect  ____________________________________________  ED COURSE:  As part of my medical decision making, I reviewed the following data within the electronic MEDICAL RECORD NUMBER History obtained from family if available, nursing notes, old chart and ekg, as well as notes from prior ED visits. Patient presented for dysuria and possible urinary retention, we will assess with labs and imaging as indicated at this time.    Procedures ____________________________________________   LABS (pertinent positives/negatives)  Labs Reviewed  URINALYSIS, COMPLETE (UACMP) WITH MICROSCOPIC - Abnormal; Notable for the following components:      Result Value   Color, Urine YELLOW (*)    APPearance CLEAR (*)    Hgb urine dipstick SMALL (*)    Leukocytes, UA SMALL (*)    Squamous Epithelial / LPF 6-30 (*)    All other components within normal limits    RADIOLOGY  Bladder scan Did reveal over 300 cc of urine after she had just voided ____________________________________________  DIFFERENTIAL DIAGNOSIS   Urinary retention, cystitis, renal colic, bladder spasms, bladder prolapse  FINAL ASSESSMENT AND PLAN  Urinary retention   Plan: Patient had presented for urinary retention. Patient's labs did not reveal any acute process. Patient's imaging failed urinary retention.  We have placed a Foley catheter and she will be referred to urology for outpatient follow-up.   Emily FilbertWilliams, Katelyn Bologna E, MD   Note: This note was generated in part or whole with voice recognition software. Voice recognition is usually quite accurate but there are transcription errors that can and very often do occur. I apologize for any typographical errors that were not detected and corrected.     Emily FilbertWilliams, [name], MD 06/09/17 [phone]

## 2017-06-09 NOTE — ED Provider Notes (Signed)
[facility] Emergency Department Provider Note  Time seen: 10:13 PM  I have reviewed the triage vital signs and the nursing notes.   HISTORY  Chief Complaint Urinary Retention    HPI Katelyn [name] is a 56 y.o. female with a past medical history of COPD, gastric reflux, hypertension, hyperlipidemia who presents to the emergency department with bladder spasm type pain.  According to the patient and record review the patient was seen in the emergency department earlier today for lower abdominal pain ultimately found to have urinary retention.  Patient was seen by her doctor on Tuesday for dysuria was diagnosed with a urinary tract infection and was started on antibiotics.  According to record review patient also had a recent CT scan 05/14/17 for abdominal discomfort that was largely negative.  Patient was then seen in the emergency department 06/08/17 for abdominal pain/bladder spasm, at that time was requesting opioid medication was given one-time dose in the ER and then Bentyl and Pyridium.  Patient states she has not filled her Pyridium yet.  Patient then presented earlier today 06/09/17 in the emergency department and continue to complain of dysuria and was having urinary retention.  Urine catheter was placed, the patient was discharged home.  Patient continues to have lower abdominal spasm-like pain so she returned to the emergency department.  Past Medical History:  Diagnosis Date  . COPD (chronic obstructive pulmonary disease) (HCC)   . GERD (gastroesophageal reflux disease)   . Hiatal hernia   . HLD (hyperlipidemia)   . Hypertension   . Smoking     Patient Active Problem List   Diagnosis Date Noted  . Chest pain 01/27/2014  . HTN (hypertension) 01/27/2014  . HLD (hyperlipidemia) 01/27/2014    Past Surgical History:  Procedure Laterality Date  . ABDOMINAL HYSTERECTOMY    . ADENOIDECTOMY    . CHOLECYSTECTOMY    . HERNIA REPAIR    . NECK SURGERY     . SHOULDER SURGERY    . TONSILLECTOMY      Prior to Admission medications   Medication Sig Start Date End Date Taking? Authorizing Provider  ADVAIR DISKUS 250-50 MCG/DOSE AEPB INHALE 1 PUFF INTO THE LUNGS TWICE A DAY as needed for shortness of breath 11/17/14   [provider]  albuterol (PROVENTIL HFA;VENTOLIN HFA) 108 (90 BASE) MCG/ACT inhaler Inhale 2 puffs into the lungs every 6 (six) hours as needed for wheezing or shortness of breath. 01/27/14   Esperanza SheetsBuriev, Ulugbek N, MD  albuterol (PROVENTIL) (2.5 MG/3ML) 0.083% nebulizer solution Take 3 mLs (2.5 mg total) by nebulization every 6 (six) hours as needed for wheezing or shortness of breath. 01/27/14   Esperanza SheetsBuriev, Ulugbek N, MD  amLODipine (NORVASC) 5 MG tablet Take 1 tablet (5 mg total) by mouth daily. 01/27/14   Esperanza SheetsBuriev, Ulugbek N, MD  aspirin EC 81 MG tablet Take 1 tablet (81 mg total) by mouth daily. 01/27/14   Esperanza SheetsBuriev, Ulugbek N, MD  atorvastatin (LIPITOR) 40 MG tablet Take 40 mg by mouth daily. 01/01/15   [provider]  benazepril-hydrochlorthiazide (LOTENSIN HCT) 20-25 MG per tablet Take 1 tablet by mouth daily. Patient taking differently: Take 0.5 tablets by mouth daily.  01/27/14   Esperanza SheetsBuriev, Ulugbek N, MD  Cholecalciferol (VITAMIN D3) 5000 UNITS CAPS Take 1 capsule by mouth See admin instructions. Take on Monday, Wednesday, and Friday    [provider]  dicyclomine (BENTYL) 20 MG tablet Take 1 tablet (20 mg total) by mouth 3 (three) times daily  as needed for spasms. 06/08/17   Sharman CheekStafford, Phillip, MD  esomeprazole (NEXIUM) 40 MG capsule Take 40 mg by mouth daily at 12 noon.    [provider]  hydrOXYzine (VISTARIL) 50 MG capsule Take 50 mg by mouth 2 (two) times daily as needed for anxiety.    [provider]  Linaclotide Karlene Einstein(LINZESS) 290 MCG CAPS capsule Take 1 capsule (290 mcg total) by mouth daily. 01/27/14   Esperanza SheetsBuriev, Ulugbek N, MD  phenazopyridine (PYRIDIUM) 200 MG tablet Take 1 tablet (200 mg total) by  mouth 3 (three) times daily as needed for pain. 06/08/17   Sharman CheekStafford, Phillip, MD  phenazopyridine (PYRIDIUM) 200 MG tablet Take 1 tablet (200 mg total) by mouth 3 (three) times daily as needed for pain. 06/09/17 06/09/18  Emily FilbertWilliams, Jonathan E, MD  promethazine (PHENERGAN) 25 MG tablet Take 25 mg by mouth every 6 (six) hours as needed for nausea or vomiting.    [provider]  SUMAtriptan (IMITREX) 100 MG tablet Take 1 tablet (100 mg total) by mouth every 2 (two) hours as needed for migraine. May repeat in 2 hours if headache persists or recurs. 01/27/14   Esperanza SheetsBuriev, Ulugbek N, MD  tiotropium (SPIRIVA) 18 MCG inhalation capsule Place 1 capsule (18 mcg total) into inhaler and inhale daily. Patient not taking: Reported on 05/14/2017 01/27/14   Esperanza SheetsBuriev, Ulugbek N, MD  traZODone (DESYREL) 150 MG tablet Take 300 mg by mouth at bedtime.  12/07/14   [provider]    Allergies  Allergen Reactions  . Ciprofloxacin Other (See Comments)    Headache  . Macrobid [Nitrofurantoin Monohyd Macro] Itching  . Sulfa Antibiotics Itching    Family History  Problem Relation Age of Onset  . CAD Father        onset age 56    Social History Social History   Tobacco Use  . Smoking status: Current Every Day Smoker    Packs/day: 0.50    Types: Cigarettes  . Smokeless tobacco: Never Used  Substance Use Topics  . Alcohol use: No  . Drug use: No    Review of Systems Constitutional: Negative for fever. Cardiovascular: Negative for chest pain. Respiratory: Negative for shortness of breath. Gastrointestinal: Abdominal pain/spasm. Genitourinary: Positive for dysuria, positive for retention now status post Foley catheter Musculoskeletal: Negative for back pain. Neurological: Negative for headache All other ROS negative  ____________________________________________   PHYSICAL EXAM:  Constitutional: Alert and oriented. Well appearing and in no distress. Eyes: Normal exam ENT   Head:  Normocephalic and atraumatic.   Mouth/Throat: Mucous membranes are moist. Cardiovascular: Normal rate, regular rhythm. No murmur Respiratory: Normal respiratory effort without tachypnea nor retractions. Breath sounds are clear  Gastrointestinal: Soft and nontender. No distention. Musculoskeletal: Nontender with normal range of motion in all extremities. Neurologic:  Normal speech and language. No gross focal neurologic deficits  Skin:  Skin is warm, dry and intact.  Psychiatric: Mood and affect are normal.   ____________________________________________   INITIAL IMPRESSION / ASSESSMENT AND PLAN / ED COURSE  Pertinent labs & imaging results that were available during my care of the patient were reviewed by me and considered in my medical decision making (see chart for details).  Patient presents to the emergency department again for lower abdominal/bladder spasm-like pain.  Patient is on antibiotics for urinary tract infection, is prescribed Bentyl as well as Pyridium for the discomfort which the patient has not began taking.  Patient has a Foley catheter in place.  Differential would include bladder  spasm, cystitis.  Given recent lab work I do not feel additional lab work or CT imaging would be of benefit to the patient.  I discussed with the patient the need to fill her Pyridium, I have reviewed the patient's substance database and there does not appear to be a patient matching her name in the database with the given date of birth.  We will discharge with 5 tablets of Norco for pain relief.  I discussed with the patient that we will not be refilling this from the emergency department and if she continues to have spasm-like pain she should follow-up with urology or her primary care doctor as referred.  Patient agreeable to this plan of care. ____________________________________________   FINAL CLINICAL IMPRESSION(S) / ED DIAGNOSES  Lower abdominal pain    Minna Antis,  MD 06/09/17 2224

## 2017-06-09 NOTE — ED Triage Notes (Signed)
Pt arrived via EMS from home with complaints of urinary retention. Pt was seen here in ER 5 hours ago with the same complaints. Pt was given a catheter and 10 L of urine was drained. Pt was sent home with catheter and leg bag. Pt currently has about 100cc of urine in bag at this time. Pt says she has abdominal pain and urge to go. VS WNL per EMS BP-110/70 and HR-60.

## 2017-06-09 NOTE — ED Notes (Signed)
Talked to Dr. Darnelle CatalanMalinda. Blood work done yesterday. Repeat urine needed only. Pt reports cannot urinate right now

## 2017-06-09 NOTE — Discharge Instructions (Signed)
As as we discussed please follow-up with urology and your primary care doctor as soon as possible.  You have been given a prescription for 5 tablets of Norco for pain relief.  Please also fill your Bentyl and Pyridium as prescribed yesterday.  Do not drive or drink alcohol while taking your pain medication.  We are unable to refill any pain medication from the emergency department going forward.

## 2017-06-09 NOTE — ED Notes (Addendum)
Pt ambulatory upon discharge. Verbalized understanding of discharge instructions, prescription and follow-up care. This RN educated re: urinary catheter and s/s of infection. VSS. Skin warm and dry. A&O x4. This RN changed urinary catheter standard drainage bag to leg bag and educated pt as to how to empty and how to adjust bag as needed.

## 2017-06-09 NOTE — ED Notes (Signed)
Bladder scanned pt - 3ml in bladder

## 2017-06-16 ENCOUNTER — Encounter: Payer: Self-pay | Admitting: Urology

## 2017-06-16 ENCOUNTER — Ambulatory Visit: Payer: Medicaid Other | Admitting: Urology

## 2017-06-16 VITALS — BP 114/79 | HR 85 | Ht 64.0 in | Wt 154.9 lb

## 2017-06-16 DIAGNOSIS — R339 Retention of urine, unspecified: Secondary | ICD-10-CM | POA: Diagnosis not present

## 2017-06-16 NOTE — Progress Notes (Signed)
Patient returned this afternoon:   Bladder Scan Patient cannot void: 463 ml Performed By: C. Rana SnareLowe, CMA Simple Catheter Placement  Due to urinary retention patient is present today for a foley cath placement.  Patient was cleaned and prepped in a sterile fashion with betadine and lidocaine jelly 2% was instilled into the urethra.  A 16 FR foley catheter was inserted, urine return was noted  463ml, urine was yellow in color.  The balloon was filled with 10cc of sterile water.  A leg bag was attached for drainage. Patient was also given a night bag to take home and was given instruction on how to change from one bag to another.  Patient was given instruction on proper catheter care.  Patient tolerated well, no complications were noted   Preformed by: C. Rana SnareLowe, CMA  Follow up: 2 week nurse visit

## 2017-06-16 NOTE — Progress Notes (Signed)
06/16/2017 9:08 AM   Katelyn [name] 07/17/1960 [id number]  Referring provider: Evelene Croon, MD [address] [place], [place] [address]  Chief Complaint  Patient presents with  . Urinary Retention    HPI: The patient is a 56 year old female presents today after developing 300 cc urinary retention having a Foley catheter placed in the emergency department.  This all started approximately 1 week ago.  She felt the urge to void but was unable to.  She is also having bladder spasms.  A Foley catheter was placed.  She has been having significant bladder spasms since that time that is brought her back to the ER.  Her urinalysis in the ER was negative for infection.  This has never happened before.  Prior to the last week, she stated that she voided well.  She did not have issues emptying her bladder.  She felt that she did not have to strain.  No significant nocturia.  No hematuria.  Of note, she does have significant constipation.  She has tried multiple laxatives for this.  She currently has a new medication prescribed by her primary care provider that she has not yet tried.   PMH: Past Medical History:  Diagnosis Date  . COPD (chronic obstructive pulmonary disease) (HCC)   . GERD (gastroesophageal reflux disease)   . Hiatal hernia   . HLD (hyperlipidemia)   . Hypertension   . Smoking     Surgical History: Past Surgical History:  Procedure Laterality Date  . ABDOMINAL HYSTERECTOMY    . ADENOIDECTOMY    . CHOLECYSTECTOMY    . HERNIA REPAIR    . NECK SURGERY    . SHOULDER SURGERY    . TONSILLECTOMY      Home Medications:  Allergies as of 06/16/2017      Reactions   Ciprofloxacin Other (See Comments)   Headache   Macrobid [nitrofurantoin Monohyd Macro] Itching   Sulfa Antibiotics Itching      Medication List        Accurate as of 06/16/17  9:08 AM. Always use your most recent med list.          amLODipine 5 MG tablet Commonly known as:  NORVASC Take 1  tablet (5 mg total) by mouth daily.   aspirin EC 81 MG tablet Take 1 tablet (81 mg total) by mouth daily.   atorvastatin 40 MG tablet Commonly known as:  LIPITOR Take 40 mg by mouth daily.   benazepril-hydrochlorthiazide 20-25 MG tablet Commonly known as:  LOTENSIN HCT Take 1 tablet by mouth daily.   dicyclomine 20 MG tablet Commonly known as:  BENTYL Take 1 tablet (20 mg total) by mouth 3 (three) times daily as needed for spasms.   esomeprazole 40 MG capsule Commonly known as:  NEXIUM Take 40 mg by mouth daily at 12 noon.   promethazine 25 MG tablet Commonly known as:  PHENERGAN Take 25 mg by mouth every 6 (six) hours as needed for nausea or vomiting.   SUMAtriptan 100 MG tablet Commonly known as:  IMITREX Take 1 tablet (100 mg total) by mouth every 2 (two) hours as needed for migraine. May repeat in 2 hours if headache persists or recurs.   traZODone 150 MG tablet Commonly known as:  DESYREL Take 300 mg by mouth at bedtime.   Vitamin D3 5000 units Caps Take 1 capsule by mouth See admin instructions. Take on Monday, Wednesday, and Friday       Allergies:  Allergies  Allergen Reactions  .  Ciprofloxacin Other (See Comments)    Headache  . Macrobid [Nitrofurantoin Monohyd Macro] Itching  . Sulfa Antibiotics Itching    Family History: Family History  Problem Relation Age of Onset  . CAD Father        onset age 566  . Bladder Cancer Neg Hx   . Kidney cancer Neg Hx     Social History:  reports that she has been smoking cigarettes.  She has been smoking about 0.50 packs per day. she has never used smokeless tobacco. She reports that she does not drink alcohol or use drugs.  ROS: UROLOGY Frequent Urination?: Yes Hard to postpone urination?: No Burning/pain with urination?: Yes Get up at night to urinate?: No Leakage of urine?: No Urine stream starts and stops?: No Trouble starting stream?: Yes Do you have to strain to urinate?: No Blood in urine?:  No Urinary tract infection?: No Sexually transmitted disease?: No Injury to kidneys or bladder?: No Painful intercourse?: No Weak stream?: No Currently pregnant?: No Vaginal bleeding?: No Last menstrual period?: n  Gastrointestinal Nausea?: Yes Vomiting?: No Indigestion/heartburn?: Yes Diarrhea?: No Constipation?: Yes  Constitutional Fever: No Night sweats?: No Weight loss?: No Fatigue?: No  Skin Skin rash/lesions?: No Itching?: No  Eyes Blurred vision?: No Double vision?: No  Ears/Nose/Throat Sore throat?: No Sinus problems?: No  Hematologic/Lymphatic Swollen glands?: No Easy bruising?: Yes  Cardiovascular Leg swelling?: Yes Chest pain?: No  Respiratory Cough?: No Shortness of breath?: No  Endocrine Excessive thirst?: No  Musculoskeletal Back pain?: Yes Joint pain?: Yes  Neurological Headaches?: No Dizziness?: No  Psychologic Depression?: No Anxiety?: Yes  Physical Exam: BP 114/79 (BP Location: Right Arm, Patient Position: Sitting, Cuff Size: Normal)   Pulse 85   Ht 5\' 4"  (1.626 m)   Wt 154 lb 14.4 oz (70.3 kg)   BMI 26.59 kg/m   Constitutional:  Alert and oriented, No acute distress. HEENT: [place] AT, moist mucus membranes.  Trachea midline, no masses. Cardiovascular: No clubbing, cyanosis, or edema. Respiratory: Normal respiratory effort, no increased work of breathing. GI: Abdomen is soft, nontender, nondistended, no abdominal masses GU: No CVA tenderness.  Skin: No rashes, bruises or suspicious lesions. Lymph: No cervical or inguinal adenopathy. Neurologic: Grossly intact, no focal deficits, moving all 4 extremities. Psychiatric: Normal mood and affect.  Laboratory Data: Lab Results  Component Value Date   WBC 4.6 06/08/2017   HGB 13.4 06/08/2017   HCT 40.1 06/08/2017   MCV 94.5 06/08/2017   PLT 208 06/08/2017    Lab Results  Component Value Date   CREATININE 0.78 06/08/2017    No results found for: PSA  No results  found for: TESTOSTERONE  Lab Results  Component Value Date   HGBA1C  11/14/2007    5.4 (NOTE)   The ADA recommends the following therapeutic goals for glycemic   control related to Hgb A1C measurement:   Goal of Therapy:   < 7.0% Hgb A1C   Action Suggested:  > 8.0% Hgb A1C   Ref:  Diabetes Care, 22, Suppl. 1, 1999    Urinalysis    Component Value Date/Time   COLORURINE YELLOW (A) 06/09/2017 1323   APPEARANCEUR CLEAR (A) 06/09/2017 1323   APPEARANCEUR Clear 01/07/2015 1516   LABSPEC 1.005 06/09/2017 1323   PHURINE 7.0 06/09/2017 1323   GLUCOSEU NEGATIVE 06/09/2017 1323   HGBUR SMALL (A) 06/09/2017 1323   BILIRUBINUR NEGATIVE 06/09/2017 1323   BILIRUBINUR Negative 01/07/2015 1516   KETONESUR NEGATIVE 06/09/2017 1323   PROTEINUR NEGATIVE  06/09/2017 1323   UROBILINOGEN 0.2 11/13/2007 2030   NITRITE NEGATIVE 06/09/2017 1323   LEUKOCYTESUR SMALL (A) 06/09/2017 1323   LEUKOCYTESUR Negative 01/07/2015 1516    Assessment & Plan:    1.  Urinary retention 2.  Constipation I discussed the patient that her urinary retention is more than likely related to her severe constipation.  I have strongly encouraged her to continue to work on her constipation issues with her PCP.  For now, we will have her undergo a voiding trial today.  If she is able to pass this, we will have her follow-up in 1 month for a PVR.  Return in about 4 weeks (around 07/14/2017) for PVR.  Hildred LaserBrian James Jeroline Wolbert, MD  [facility] [address], [address] [place], [place] [address] [phone]

## 2017-06-16 NOTE — Progress Notes (Signed)
Fill and Pull Catheter Removal  Patient is present today for a catheter removal.  Patient was cleaned and prepped in a sterile fashion 300ml of sterile water/ saline was instilled into the bladder when the patient felt the urge to urinate. 9ml of water was then drained from the balloon.  A 16FR foley cath was removed from the bladder no complications were noted .  Patient as then given some time to void on their own.  Patient cannot void on their own after some time.  Patient tolerated well.  Preformed by: Teressa Lowerarrie Daaiyah Baumert, CMA  Follow up/ Additional notes: She is to return today by 3:00Pm for a PVR

## 2017-06-29 ENCOUNTER — Emergency Department: Payer: Medicaid Other

## 2017-06-29 ENCOUNTER — Emergency Department
Admission: EM | Admit: 2017-06-29 | Discharge: 2017-06-30 | Disposition: A | Discharge status: Home / Self Care | Payer: Medicaid Other | Attending: Emergency Medicine | Admitting: Emergency Medicine

## 2017-06-29 ENCOUNTER — Other Ambulatory Visit: Payer: Self-pay

## 2017-06-29 ENCOUNTER — Encounter: Payer: Self-pay | Admitting: Emergency Medicine

## 2017-06-29 DIAGNOSIS — I10 Essential (primary) hypertension: Secondary | ICD-10-CM | POA: Diagnosis not present

## 2017-06-29 DIAGNOSIS — N3000 Acute cystitis without hematuria: Secondary | ICD-10-CM | POA: Insufficient documentation

## 2017-06-29 DIAGNOSIS — R1030 Lower abdominal pain, unspecified: Secondary | ICD-10-CM

## 2017-06-29 DIAGNOSIS — R1032 Left lower quadrant pain: Secondary | ICD-10-CM | POA: Diagnosis present

## 2017-06-29 DIAGNOSIS — Z7982 Long term (current) use of aspirin: Secondary | ICD-10-CM | POA: Insufficient documentation

## 2017-06-29 DIAGNOSIS — F17210 Nicotine dependence, cigarettes, uncomplicated: Secondary | ICD-10-CM | POA: Diagnosis not present

## 2017-06-29 DIAGNOSIS — J449 Chronic obstructive pulmonary disease, unspecified: Secondary | ICD-10-CM | POA: Insufficient documentation

## 2017-06-29 DIAGNOSIS — Z79899 Other long term (current) drug therapy: Secondary | ICD-10-CM | POA: Insufficient documentation

## 2017-06-29 LAB — LIPASE, BLOOD: Lipase: 29 U/L (ref 11–51)

## 2017-06-29 LAB — URINALYSIS, COMPLETE (UACMP) WITH MICROSCOPIC
Bacteria, UA: NONE SEEN
Bilirubin Urine: NEGATIVE
GLUCOSE, UA: NEGATIVE mg/dL
Ketones, ur: NEGATIVE mg/dL
NITRITE: NEGATIVE
PH: 7 (ref 5.0–8.0)
Protein, ur: NEGATIVE mg/dL
SPECIFIC GRAVITY, URINE: 1.01 (ref 1.005–1.030)

## 2017-06-29 LAB — COMPREHENSIVE METABOLIC PANEL
ALK PHOS: 56 U/L (ref 38–126)
ALT: 46 U/L (ref 14–54)
AST: 36 U/L (ref 15–41)
Albumin: 3.9 g/dL (ref 3.5–5.0)
Anion gap: 8 (ref 5–15)
BUN: 11 mg/dL (ref 6–20)
CALCIUM: 8.8 mg/dL — AB (ref 8.9–10.3)
CO2: 25 mmol/L (ref 22–32)
CREATININE: 0.63 mg/dL (ref 0.44–1.00)
Chloride: 100 mmol/L — ABNORMAL LOW (ref 101–111)
GFR calc non Af Amer: >60 mL/min (ref >60)
Glucose, Bld: 96 mg/dL (ref 65–99)
Potassium: 3.5 mmol/L (ref 3.5–5.1)
SODIUM: 133 mmol/L — AB (ref 135–145)
Total Bilirubin: 0.6 mg/dL (ref 0.3–1.2)
Total Protein: 6.5 g/dL (ref 6.5–8.1)

## 2017-06-29 LAB — CBC
HCT: 39.5 % (ref 35.0–47.0)
HEMOGLOBIN: 13.3 g/dL (ref 12.0–16.0)
MCH: 31.8 pg (ref 26.0–34.0)
MCHC: 33.8 g/dL (ref 32.0–36.0)
MCV: 94.2 fL (ref 80.0–100.0)
PLATELETS: 288 10*3/uL (ref 150–440)
RBC: 4.19 MIL/uL (ref 3.80–5.20)
RDW: 13 % (ref 11.5–14.5)
WBC: 10.8 10*3/uL (ref 3.6–11.0)

## 2017-06-29 MED ORDER — HYDROCODONE-ACETAMINOPHEN 5-325 MG PO TABS: 1.0000 | ORAL_TABLET | Freq: Four times a day (QID) | ORAL | 0 refills | Status: AC | PRN

## 2017-06-29 MED ORDER — IOPAMIDOL (ISOVUE-300) INJECTION 61%
100.0000 mL | Freq: Once | INTRAVENOUS | Status: AC | PRN
  Administered 2017-06-29: 100 mL via INTRAVENOUS

## 2017-06-29 MED ORDER — HYDROMORPHONE HCL 1 MG/ML IJ SOLN
0.5000 mg | Freq: Once | INTRAMUSCULAR | Status: AC
  Administered 2017-06-29: 0.5 mg via INTRAVENOUS
  Filled 2017-06-29: qty 1

## 2017-06-29 MED ORDER — HYDROMORPHONE HCL 1 MG/ML IJ SOLN: 1.0000 mg | Freq: Once | INTRAMUSCULAR | Status: DC

## 2017-06-29 MED ORDER — CEFTRIAXONE SODIUM IN DEXTROSE 20 MG/ML IV SOLN
1.0000 g | Freq: Once | INTRAVENOUS | Status: AC
  Administered 2017-06-29: 1 g via INTRAVENOUS
  Filled 2017-06-29: qty 50

## 2017-06-29 MED ORDER — IOPAMIDOL (ISOVUE-300) INJECTION 61%
30.0000 mL | Freq: Once | INTRAVENOUS | Status: AC
  Administered 2017-06-29: 30 mL via ORAL

## 2017-06-29 MED ORDER — HYDROMORPHONE HCL 1 MG/ML IJ SOLN
1.0000 mg | Freq: Once | INTRAMUSCULAR | Status: AC
  Administered 2017-06-29: 1 mg via INTRAVENOUS
  Filled 2017-06-29: qty 1

## 2017-06-29 MED ORDER — ONDANSETRON HCL 4 MG/2ML IJ SOLN
4.0000 mg | Freq: Once | INTRAMUSCULAR | Status: AC
  Administered 2017-06-29: 4 mg via INTRAVENOUS
  Filled 2017-06-29: qty 2

## 2017-06-29 MED ORDER — CEPHALEXIN 500 MG PO CAPS: 500.0000 mg | ORAL_CAPSULE | Freq: Two times a day (BID) | ORAL | 0 refills | Status: AC

## 2017-06-29 MED ORDER — SODIUM CHLORIDE 0.9 % IV BOLUS (SEPSIS)
1000.0000 mL | Freq: Once | INTRAVENOUS | Status: AC
  Administered 2017-06-29: 1000 mL via INTRAVENOUS

## 2017-06-29 MED ORDER — DIPHENHYDRAMINE HCL 25 MG PO CAPS
25.0000 mg | ORAL_CAPSULE | Freq: Once | ORAL | Status: AC
  Administered 2017-06-29: 25 mg via ORAL
  Filled 2017-06-29: qty 1

## 2017-06-29 NOTE — ED Notes (Signed)
Tolerating graham crackers and ginger ale

## 2017-06-29 NOTE — ED Notes (Signed)
Patient transported to CT 

## 2017-06-29 NOTE — ED Provider Notes (Signed)
[facility] Emergency Department Provider Note ____________________________________________   First MD Initiated Contact with Patient 06/29/17 1844     (approximate)  I have reviewed the triage vital signs and the nursing notes.   HISTORY  Chief Complaint Abdominal Pain    HPI Katelyn [name] is a 57 y.o. female with past medical history as noted below who presents with abdominal pain primarily in the left lower quadrant but also somewhat in the right, constant, described as sharp like a knife, and worsening over the course of the last 2 days.  Patient reports associated nausea but no vomiting, and denies diarrhea or any blood in the stool.  She states she had a bowel movement today which was normal.  She denies any acute urinary symptoms but states she has had a urinary catheter in place for the last 3 weeks due to urinary retention.  She denies prior history of pain like she is having now.   Past Medical History:  Diagnosis Date  . COPD (chronic obstructive pulmonary disease) (HCC)   . GERD (gastroesophageal reflux disease)   . Hiatal hernia   . HLD (hyperlipidemia)   . Hypertension   . Smoking     Patient Active Problem List   Diagnosis Date Noted  . Chest pain 01/27/2014  . HTN (hypertension) 01/27/2014  . HLD (hyperlipidemia) 01/27/2014    Past Surgical History:  Procedure Laterality Date  . ABDOMINAL HYSTERECTOMY    . ADENOIDECTOMY    . CHOLECYSTECTOMY    . HERNIA REPAIR    . NECK SURGERY    . SHOULDER SURGERY    . TONSILLECTOMY      Prior to Admission medications   Medication Sig Start Date End Date Taking? Authorizing Provider  amLODipine (NORVASC) 5 MG tablet Take 1 tablet (5 mg total) by mouth daily. 01/27/14   Esperanza SheetsBuriev, Ulugbek N, MD  aspirin EC 81 MG tablet Take 1 tablet (81 mg total) by mouth daily. 01/27/14   Esperanza SheetsBuriev, Ulugbek N, MD  atorvastatin (LIPITOR) 40 MG tablet Take 40 mg by mouth daily. 01/01/15   [provider]    benazepril-hydrochlorthiazide (LOTENSIN HCT) 20-25 MG per tablet Take 1 tablet by mouth daily. Patient taking differently: Take 0.5 tablets by mouth daily.  01/27/14   Esperanza SheetsBuriev, Ulugbek N, MD  Cholecalciferol (VITAMIN D3) 5000 UNITS CAPS Take 1 capsule by mouth See admin instructions. Take on Monday, Wednesday, and Friday    [provider]  dicyclomine (BENTYL) 20 MG tablet Take 1 tablet (20 mg total) by mouth 3 (three) times daily as needed for spasms. 06/08/17   Sharman CheekStafford, Phillip, MD  esomeprazole (NEXIUM) 40 MG capsule Take 40 mg by mouth daily at 12 noon.    [provider]  promethazine (PHENERGAN) 25 MG tablet Take 25 mg by mouth every 6 (six) hours as needed for nausea or vomiting.    [provider]  SUMAtriptan (IMITREX) 100 MG tablet Take 1 tablet (100 mg total) by mouth every 2 (two) hours as needed for migraine. May repeat in 2 hours if headache persists or recurs. 01/27/14   Esperanza SheetsBuriev, Ulugbek N, MD  traZODone (DESYREL) 150 MG tablet Take 300 mg by mouth at bedtime.  12/07/14   [provider]    Allergies Ciprofloxacin; Macrobid [nitrofurantoin monohyd macro]; and Sulfa antibiotics  Family History  Problem Relation Age of Onset  . CAD Father        onset age 58  . Bladder Cancer Neg Hx   .  Kidney cancer Neg Hx     Social History Social History   Tobacco Use  . Smoking status: Current Every Day Smoker    Packs/day: 0.50    Types: Cigarettes  . Smokeless tobacco: Never Used  Substance Use Topics  . Alcohol use: No  . Drug use: No    Review of Systems  Constitutional: Positive for chills. Eyes: No redness. ENT: No sore throat. Cardiovascular: Denies chest pain. Respiratory: Denies shortness of breath. Gastrointestinal: Positive for nausea, no vomiting or diarrhea.  Genitourinary: Negative for dysuria.  Musculoskeletal: Negative for back pain. Skin: Negative for rash. Neurological: Negative for  headache.   ____________________________________________   PHYSICAL EXAM:  VITAL SIGNS: ED Triage Vitals  Enc Vitals Group     BP 06/29/17 1548 106/77     Pulse Rate 06/29/17 1548 84     Resp 06/29/17 1548 18     Temp 06/29/17 1548 97.6 F (36.4 C)     Temp Source 06/29/17 1548 Oral     SpO2 06/29/17 1548 97 %     Weight 06/29/17 1549 154 lb (69.9 kg)     Height 06/29/17 1549 5\' 4"  (1.626 m)     Head Circumference --      Peak Flow --      Pain Score 06/29/17 1548 10     Pain Loc --      Pain Edu? --      Excl. in GC? --     Constitutional: Alert and oriented.  Uncomfortable appearing but in no acute distress. Eyes: Conjunctivae are normal.  No scleral icterus. Head: Atraumatic. Nose: No congestion/rhinnorhea. Mouth/Throat: Mucous membranes are somewhat dry.   Neck: Normal range of motion.  Cardiovascular: Normal rate, regular rhythm. Grossly normal heart sounds.  Good peripheral circulation. Respiratory: Normal respiratory effort.  No retractions. Lungs CTAB. Gastrointestinal: Soft with moderate left lower quadrant and left mid abdominal tenderness. No distention.  Genitourinary: No CVA tenderness. Musculoskeletal: No lower extremity edema.  Extremities warm and well perfused.  Neurologic:  Normal speech and language. No gross focal neurologic deficits are appreciated.  Skin:  Skin is warm and dry. No rash noted. Psychiatric: Mood and affect are normal. Speech and behavior are normal.  ____________________________________________   LABS (all labs ordered are listed, but only abnormal results are displayed)  Labs Reviewed  COMPREHENSIVE METABOLIC PANEL - Abnormal; Notable for the following components:      Result Value   Sodium 133 (*)    Chloride 100 (*)    Calcium 8.8 (*)    All other components within normal limits  URINALYSIS, COMPLETE (UACMP) WITH MICROSCOPIC - Abnormal; Notable for the following components:   Hgb urine dipstick SMALL (*)    Leukocytes,  UA MODERATE (*)    Squamous Epithelial / LPF 0-5 (*)    All other components within normal limits  LIPASE, BLOOD  CBC   ____________________________________________  EKG   ____________________________________________  RADIOLOGY  CT abd: Mild dilatation of the biliary tree but no evidence of bowel obstruction or other acute findings.  ____________________________________________   PROCEDURES  Procedure(s) performed: No    Critical Care performed: No ____________________________________________   INITIAL IMPRESSION / ASSESSMENT AND PLAN / ED COURSE  Pertinent labs & imaging results that were available during my care of the patient were reviewed by me and considered in my medical decision making (see chart for details).  57 year old female with past medical history as noted above presents with 2 days of gradual onset  worsening left lower quadrant abdominal pain associated with nausea and chills.  She reports recently having gotten a urinary catheter placed, but denies any acute urinary symptoms.  I reviewed the past medical records in Epic and confirmed that the patient was seen in the ED multiple times in December with urinary retention and bladder spasm type pain, and continued to have pain with an additional ED visit after a urinary catheter was placed.  Patient states that the current pain is different to this.  On exam, of the vital signs are normal except for borderline blood pressure, the patient is uncomfortable but not acutely ill-appearing, and there is tenderness throughout the left lower quadrant left mid abdomen.  Differential includes UTI/pyelonephritis, diverticulitis, colitis, or other intra-abdominal cause.  We will obtain labs, UA, give fluids, analgesia and obtain a CT abdomen.  ----------------------------------------- 11:06 PM on 06/29/2017 -----------------------------------------  CT shows mild dilatation of the biliary tree status post  cholecystectomy, but the patient has no upper abdominal pain or tenderness in this area.  CT also consistent with possible cystitis, which is confirmed by her urinalysis.  I attempted to look up old cultures although none are available in Epic.  I asked the patient if she had ever been resistant to antibiotic that she knows of, but she stated that she did not believe so he did not remember the names of the antibiotic she has been on in the past for UTIs.  At this time the vital signs are stable, the patient is relatively comfortable appearing, and there is no evidence of sepsis.  There is no indication for admission.  Patient has had multiple doses of Dilaudid, however based on her prior history I believe that she is somewhat opiate tolerant.  I will give the patient a small amount of Norco to treat her pain at home as well as Keflex for outpatient treatment of UTI.  Patient agrees with the discharge plan and feels well to go home.  I explained return precautions as well, and she expresses understanding. ____________________________________________   FINAL CLINICAL IMPRESSION(S) / ED DIAGNOSES  Final diagnoses:  Acute cystitis without hematuria  Lower abdominal pain      NEW MEDICATIONS STARTED DURING THIS VISIT:  New Prescriptions   No medications on file     Note:  This document was prepared using Dragon voice recognition software and may include unintentional dictation errors.    Dionne Bucy, MD 06/29/17 2308

## 2017-06-29 NOTE — Discharge Instructions (Signed)
Return to the ER immediately for new or worsening pain, fevers, weakness, blood in the urine, back or flank pain, or any other new or worsening symptoms that concern you.  You should take the antibiotic as prescribed starting tomorrow, and finish the full course.  Follow-up with your primary care doctor and with your urologist within the next 2 weeks.

## 2017-06-29 NOTE — ED Notes (Signed)
First nurse note: [facility] for reports of lower abdominal pain for several months and constipation. EMS reports VSS.

## 2017-06-29 NOTE — ED Triage Notes (Signed)
Pt presents to ED via POV with c/o umbilical abdominal pain that radiates to the L side. Pt also c/o nausea, denies vomiting or diarrhea. Pt states had US done through PCP yesterday but "it'll be a while before [she] knows anything".

## 2017-06-30 MED ORDER — PHENAZOPYRIDINE HCL 100 MG PO TABS: 100.0000 mg | ORAL_TABLET | Freq: Three times a day (TID) | ORAL | 0 refills | Status: AC | PRN

## 2017-06-30 MED ORDER — PHENAZOPYRIDINE HCL 100 MG PO TABS
95.0000 mg | ORAL_TABLET | Freq: Once | ORAL | Status: AC
  Administered 2017-06-30: 100 mg via ORAL

## 2017-06-30 MED ORDER — ONDANSETRON 4 MG PO TBDP
4.0000 mg | ORAL_TABLET | Freq: Once | ORAL | Status: AC
  Administered 2017-06-30: 4 mg via ORAL
  Filled 2017-06-30: qty 1

## 2017-06-30 MED ORDER — PHENAZOPYRIDINE HCL 200 MG PO TABS
ORAL_TABLET | ORAL | Status: AC
  Filled 2017-06-30: qty 1

## 2017-07-14 ENCOUNTER — Ambulatory Visit: Payer: Medicaid Other

## 2017-07-17 ENCOUNTER — Ambulatory Visit: Payer: Medicaid Other | Admitting: Urology

## 2017-07-17 ENCOUNTER — Encounter: Payer: Self-pay | Admitting: Urology

## 2017-07-17 VITALS — BP 108/69 | HR 90 | Ht 64.0 in | Wt 155.0 lb

## 2017-07-17 DIAGNOSIS — R339 Retention of urine, unspecified: Secondary | ICD-10-CM | POA: Diagnosis not present

## 2017-07-17 DIAGNOSIS — R338 Other retention of urine: Secondary | ICD-10-CM | POA: Insufficient documentation

## 2017-07-17 LAB — URINALYSIS, COMPLETE
Bilirubin, UA: NEGATIVE
GLUCOSE, UA: NEGATIVE
KETONES UA: NEGATIVE
NITRITE UA: POSITIVE — AB
SPEC GRAV UA: 1.01 (ref 1.005–1.030)
UUROB: 0.2 mg/dL (ref 0.2–1.0)
pH, UA: 7 (ref 5.0–7.5)

## 2017-07-17 LAB — MICROSCOPIC EXAMINATION

## 2017-07-17 MED ORDER — CEFUROXIME AXETIL 250 MG PO TABS: 250.0000 mg | ORAL_TABLET | Freq: Two times a day (BID) | ORAL | 0 refills | Status: AC

## 2017-07-17 NOTE — Progress Notes (Signed)
07/17/2017 8:28 AM   Katelyn [name] [birth year] [id number]  Referring provider: Evelene Croon, MD [address] [place], [place] [address]  Chief Complaint  Patient presents with  . Urinary Retention    HPI: 57 year old female presents for follow-up of urinary retention.  She saw Dr. Sherryl Barters on 12/28.  She apparently had a voiding trial at that visit and states she had to come back to have the catheter replaced although I do not see this in the chart.  In the past few days she complains of a throbbing, spasmodic suprapubic pain and thinks she has a UTI.  She denies fever, chills, nausea or vomiting.  She denies prior neurologic history including CVA, Parkinson's, MS.   PMH: Past Medical History:  Diagnosis Date  . COPD (chronic obstructive pulmonary disease) (HCC)   . GERD (gastroesophageal reflux disease)   . Hiatal hernia   . HLD (hyperlipidemia)   . Hypertension   . Smoking     Surgical History: Past Surgical History:  Procedure Laterality Date  . ABDOMINAL HYSTERECTOMY    . ADENOIDECTOMY    . CHOLECYSTECTOMY    . HERNIA REPAIR    . NECK SURGERY    . SHOULDER SURGERY    . TONSILLECTOMY      Home Medications:  Allergies as of 07/17/2017      Reactions   Ciprofloxacin Other (See Comments)   Headache   Macrobid [nitrofurantoin Monohyd Macro] Itching   Sulfa Antibiotics Itching      Medication List        Accurate as of 07/17/17  8:28 AM. Always use your most recent med list.          amLODipine 5 MG tablet Commonly known as:  NORVASC Take 1 tablet (5 mg total) by mouth daily.   aspirin EC 81 MG tablet Take 1 tablet (81 mg total) by mouth daily.   atorvastatin 40 MG tablet Commonly known as:  LIPITOR Take 40 mg by mouth daily.   benazepril-hydrochlorthiazide 20-25 MG tablet Commonly known as:  LOTENSIN HCT Take 1 tablet by mouth daily.   dicyclomine 20 MG tablet Commonly known as:  BENTYL Take 1 tablet (20 mg total) by mouth 3 (three) times  daily as needed for spasms.   esomeprazole 40 MG capsule Commonly known as:  NEXIUM Take 40 mg by mouth daily at 12 noon.   promethazine 25 MG tablet Commonly known as:  PHENERGAN Take 25 mg by mouth every 6 (six) hours as needed for nausea or vomiting.   SUMAtriptan 100 MG tablet Commonly known as:  IMITREX Take 1 tablet (100 mg total) by mouth every 2 (two) hours as needed for migraine. May repeat in 2 hours if headache persists or recurs.   traZODone 150 MG tablet Commonly known as:  DESYREL Take 300 mg by mouth at bedtime.   Vitamin D3 5000 units Caps Take 1 capsule by mouth See admin instructions. Take on Monday, Wednesday, and Friday       Allergies:  Allergies  Allergen Reactions  . Ciprofloxacin Other (See Comments)    Headache  . Macrobid [Nitrofurantoin Monohyd Macro] Itching  . Sulfa Antibiotics Itching    Family History: Family History  Problem Relation Age of Onset  . CAD Father        onset age 10  . Bladder Cancer Neg Hx   . Kidney cancer Neg Hx     Social History:  reports that she has been smoking cigarettes.  She has  been smoking about 0.50 packs per day. she has never used smokeless tobacco. She reports that she does not drink alcohol or use drugs.  ROS: UROLOGY Frequent Urination?: Yes Hard to postpone urination?: No Burning/pain with urination?: Yes Get up at night to urinate?: Yes Leakage of urine?: No Urine stream starts and stops?: Yes Trouble starting stream?: Yes Do you have to strain to urinate?: Yes Blood in urine?: No Urinary tract infection?: No Sexually transmitted disease?: No Injury to kidneys or bladder?: No Painful intercourse?: No Weak stream?: Yes Currently pregnant?: No Vaginal bleeding?: No Last menstrual period?: n  Gastrointestinal Nausea?: Yes Vomiting?: No Indigestion/heartburn?: Yes Diarrhea?: No Constipation?: Yes  Constitutional Fever: No Night sweats?: Yes Weight loss?: No Fatigue?:  No  Skin Skin rash/lesions?: No Itching?: No  Eyes Blurred vision?: Yes Double vision?: No  Ears/Nose/Throat Sore throat?: No Sinus problems?: Yes  Hematologic/Lymphatic Swollen glands?: No Easy bruising?: Yes  Cardiovascular Leg swelling?: No Chest pain?: No  Respiratory Cough?: No Shortness of breath?: No  Endocrine Excessive thirst?: No  Musculoskeletal Back pain?: Yes Joint pain?: Yes  Neurological Headaches?: Yes Dizziness?: No  Psychologic Depression?: No Anxiety?: No  Physical Exam: BP 108/69   Pulse 90   Ht 5\' 4"  (1.626 m)   Wt 155 lb (70.3 kg)   BMI 26.61 kg/m   Constitutional:  Alert and oriented, No acute distress. HEENT: [place] AT, moist mucus membranes.  Trachea midline, no masses. Cardiovascular: No clubbing, cyanosis, or edema. Respiratory: Normal respiratory effort, no increased work of breathing. GI: Abdomen is soft, nontender, nondistended, no abdominal masses GU: No CVA tenderness. Skin: No rashes, bruises or suspicious lesions. Lymph: No cervical or inguinal adenopathy. Neurologic: Grossly intact, no focal deficits, moving all 4 extremities. Psychiatric: Normal mood and affect.  Laboratory Data: Lab Results  Component Value Date   WBC 10.8 06/29/2017   HGB 13.3 06/29/2017   HCT 39.5 06/29/2017   MCV 94.2 06/29/2017   PLT 288 06/29/2017    Lab Results  Component Value Date   CREATININE 0.63 06/29/2017    Lab Results  Component Value Date   HGBA1C  11/14/2007    5.4 (NOTE)   The ADA recommends the following therapeutic goals for glycemic   control related to Hgb A1C measurement:   Goal of Therapy:   < 7.0% Hgb A1C   Action Suggested:  > 8.0% Hgb A1C   Ref:  Diabetes Care, 22, Suppl. 1, 1999      Pertinent Imaging:   Results for orders placed during the hospital encounter of 06/08/17  CT Renal Stone Study   Narrative CLINICAL DATA:  Left flank and bladder pain.  EXAM: CT ABDOMEN AND PELVIS WITHOUT  CONTRAST  TECHNIQUE: Multidetector CT imaging of the abdomen and pelvis was performed following the standard protocol without IV contrast.  COMPARISON:  CT abdomen pelvis dated May 14, 2017.  FINDINGS: Lower chest: No acute abnormality.  Emphysematous changes.  Hepatobiliary: Known left hepatic lobe hemangioma is not well seen without intravenous contrast. Status post cholecystectomy. No biliary dilatation.  Pancreas: Unremarkable. No pancreatic ductal dilatation or surrounding inflammatory changes.  Spleen: Normal in size without focal abnormality.  Adrenals/Urinary Tract: Adrenal glands are unremarkable. Kidneys are normal, without renal calculi, focal lesion, or hydronephrosis. Bladder is unremarkable.  Stomach/Bowel: Small hiatal hernia. Stomach is otherwise within normal limits. Appendix is normal. No evidence of bowel wall thickening, distention, or surrounding inflammatory changes. Moderate colonic stool burden.  Vascular/Lymphatic: Aortic atherosclerosis. No enlarged abdominal or pelvic  lymph nodes.  Reproductive: Status post hysterectomy. No adnexal masses.  Other: No free fluid or pneumoperitoneum.  Musculoskeletal: No acute or significant osseous findings. Stable grade 1 anterolisthesis at L4-L5.  IMPRESSION: 1.  No acute intra-abdominal process.  No obstructive uropathy. 2.  Aortic atherosclerosis (ICD10-I70.0).   Electronically Signed   By: Obie Dredge M.D.   On: 06/08/2017 18:42     Assessment & Plan:   57 year old female with persistent urinary retention.  Her catheter was clamped and urine will be sent for culture.  Will empirically start antibiotics pending the culture report and have her return next week for repeat voiding trial.  If she has persistent retention will need a urodynamic study.  I discussed CIC and she declined at this time.   Riki Altes, MD  [facility] [address] [place], [place] [address] [phone]

## 2017-07-20 LAB — CULTURE, URINE COMPREHENSIVE

## 2017-07-25 ENCOUNTER — Ambulatory Visit: Payer: Medicaid Other

## 2017-11-16 ENCOUNTER — Emergency Department
Admission: EM | Admit: 2017-11-16 | Discharge: 2017-11-16 | Disposition: A | Discharge status: Home / Self Care | Payer: Medicaid Other | Attending: Student in an Organized Health Care Education/Training Program | Admitting: Student in an Organized Health Care Education/Training Program

## 2017-11-16 ENCOUNTER — Encounter: Payer: Self-pay | Admitting: Emergency Medicine

## 2017-11-16 ENCOUNTER — Other Ambulatory Visit: Payer: Self-pay

## 2017-11-16 ENCOUNTER — Telehealth: Payer: Self-pay | Admitting: Urology

## 2017-11-16 ENCOUNTER — Ambulatory Visit (INDEPENDENT_AMBULATORY_CARE_PROVIDER_SITE_OTHER): Payer: Medicaid Other | Admitting: Family Medicine

## 2017-11-16 VITALS — BP 153/78 | HR 80 | Temp 97.6°F | Ht 64.0 in | Wt 154.4 lb

## 2017-11-16 DIAGNOSIS — Z7982 Long term (current) use of aspirin: Secondary | ICD-10-CM | POA: Diagnosis not present

## 2017-11-16 DIAGNOSIS — I10 Essential (primary) hypertension: Secondary | ICD-10-CM | POA: Diagnosis not present

## 2017-11-16 DIAGNOSIS — N939 Abnormal uterine and vaginal bleeding, unspecified: Secondary | ICD-10-CM | POA: Diagnosis present

## 2017-11-16 DIAGNOSIS — R300 Dysuria: Secondary | ICD-10-CM | POA: Diagnosis not present

## 2017-11-16 DIAGNOSIS — F17210 Nicotine dependence, cigarettes, uncomplicated: Secondary | ICD-10-CM | POA: Insufficient documentation

## 2017-11-16 DIAGNOSIS — Z79899 Other long term (current) drug therapy: Secondary | ICD-10-CM | POA: Diagnosis not present

## 2017-11-16 DIAGNOSIS — J449 Chronic obstructive pulmonary disease, unspecified: Secondary | ICD-10-CM | POA: Diagnosis not present

## 2017-11-16 DIAGNOSIS — R310 Gross hematuria: Secondary | ICD-10-CM

## 2017-11-16 LAB — MICROSCOPIC EXAMINATION: RBC, UA: >30 /hpf — ABNORMAL HIGH (ref 0–2)

## 2017-11-16 LAB — CBC WITH DIFFERENTIAL/PLATELET
Basophils Absolute: 0 10*3/uL (ref 0–0.1)
Basophils Relative: 0 %
Eosinophils Absolute: 0 10*3/uL (ref 0–0.7)
Eosinophils Relative: 0 %
HEMATOCRIT: 36.9 % (ref 35.0–47.0)
HEMOGLOBIN: 12.7 g/dL (ref 12.0–16.0)
LYMPHS ABS: 1.1 10*3/uL (ref 1.0–3.6)
LYMPHS PCT: 12 %
MCH: 31.8 pg (ref 26.0–34.0)
MCHC: 34.5 g/dL (ref 32.0–36.0)
MCV: 92.3 fL (ref 80.0–100.0)
MONOS PCT: 6 %
Monocytes Absolute: 0.5 10*3/uL (ref 0.2–0.9)
NEUTROS PCT: 82 %
Neutro Abs: 7.2 10*3/uL — ABNORMAL HIGH (ref 1.4–6.5)
Platelets: 233 10*3/uL (ref 150–440)
RBC: 3.99 MIL/uL (ref 3.80–5.20)
RDW: 12.9 % (ref 11.5–14.5)
WBC: 8.9 10*3/uL (ref 3.6–11.0)

## 2017-11-16 LAB — URINALYSIS, COMPLETE
Bilirubin, UA: NEGATIVE
Glucose, UA: NEGATIVE
KETONES UA: NEGATIVE
NITRITE UA: POSITIVE — AB
SPEC GRAV UA: 1.015 (ref 1.005–1.030)
Urobilinogen, Ur: 0.2 mg/dL (ref 0.2–1.0)
pH, UA: 7 (ref 5.0–7.5)

## 2017-11-16 LAB — BASIC METABOLIC PANEL
Anion gap: 9 (ref 5–15)
BUN: 8 mg/dL (ref 6–20)
CHLORIDE: 95 mmol/L — AB (ref 101–111)
CO2: 25 mmol/L (ref 22–32)
CREATININE: 0.6 mg/dL (ref 0.44–1.00)
Calcium: 8.5 mg/dL — ABNORMAL LOW (ref 8.9–10.3)
GFR calc Af Amer: >60 mL/min (ref >60)
GFR calc non Af Amer: >60 mL/min (ref >60)
GLUCOSE: 104 mg/dL — AB (ref 65–99)
POTASSIUM: 3 mmol/L — AB (ref 3.5–5.1)
Sodium: 129 mmol/L — ABNORMAL LOW (ref 135–145)

## 2017-11-16 LAB — URINALYSIS, COMPLETE (UACMP) WITH MICROSCOPIC
Bilirubin Urine: NEGATIVE
Glucose, UA: NEGATIVE mg/dL
Ketones, ur: NEGATIVE mg/dL
NITRITE: NEGATIVE
PH: 6 (ref 5.0–8.0)
Protein, ur: 30 mg/dL — AB
RBC / HPF: >50 RBC/hpf — ABNORMAL HIGH (ref 0–5)
SPECIFIC GRAVITY, URINE: 1.018 (ref 1.005–1.030)
WBC, UA: >50 WBC/hpf — ABNORMAL HIGH (ref 0–5)

## 2017-11-16 LAB — BLADDER SCAN AMB NON-IMAGING: Scan Result: 15

## 2017-11-16 MED ORDER — PHENAZOPYRIDINE HCL 100 MG PO TABS: 100.0000 mg | ORAL_TABLET | Freq: Three times a day (TID) | ORAL | 0 refills | Status: DC | PRN

## 2017-11-16 MED ORDER — HYDROCODONE-ACETAMINOPHEN 5-325 MG PO TABS: 1.0000 | ORAL_TABLET | ORAL | 0 refills | Status: DC | PRN

## 2017-11-16 MED ORDER — CEFTRIAXONE SODIUM 1 G IJ SOLR: 1.0000 g | Freq: Once | INTRAMUSCULAR | 0 refills | Status: DC

## 2017-11-16 MED ORDER — CEFTRIAXONE SODIUM 1 G IJ SOLR
1.0000 g | Freq: Once | INTRAMUSCULAR | Status: AC
  Administered 2017-11-16: 1 g via INTRAMUSCULAR

## 2017-11-16 MED ORDER — HYDROCODONE-ACETAMINOPHEN 5-325 MG PO TABS
1.0000 | ORAL_TABLET | Freq: Once | ORAL | Status: AC
  Administered 2017-11-16: 1 via ORAL
  Filled 2017-11-16: qty 1

## 2017-11-16 MED ORDER — CEPHALEXIN 500 MG PO CAPS: 500.0000 mg | ORAL_CAPSULE | Freq: Three times a day (TID) | ORAL | 0 refills | Status: DC

## 2017-11-16 NOTE — ED Provider Notes (Signed)
[facility] Emergency Department Provider Note    First MD Initiated Contact with Patient 11/16/17 1647     (approximate)  I have reviewed the triage vital signs and the nursing notes.   HISTORY  Chief Complaint Vaginal Bleeding    HPI Katelyn [name] is a 57 y.o. female presents for evaluation of bleeding and pain with imaging or bladder.  States that she is concerned that the bleeding is coming from her vagina.  Patient had a chronic indwelling Foley for urinary retention removed today.  Patient is concerned that they "tore me" when they remove the Foley catheter.  She was started on antibiotics and was given a dose of IV M antibiotics in clinic today.  She is status post hysterectomy.  Denies any interval trauma.  Is not taking anything for pain at home.  Past Medical History:  Diagnosis Date  . COPD (chronic obstructive pulmonary disease) (HCC)   . GERD (gastroesophageal reflux disease)   . Hiatal hernia   . HLD (hyperlipidemia)   . Hypertension   . Smoking    Family History  Problem Relation Age of Onset  . CAD Father        onset age 27  . Bladder Cancer Neg Hx   . Kidney cancer Neg Hx    Past Surgical History:  Procedure Laterality Date  . ABDOMINAL HYSTERECTOMY    . ADENOIDECTOMY    . CHOLECYSTECTOMY    . HERNIA REPAIR    . NECK SURGERY    . SHOULDER SURGERY    . TONSILLECTOMY     Patient Active Problem List   Diagnosis Date Noted  . Urinary retention 07/17/2017  . Chest pain 01/27/2014  . HTN (hypertension) 01/27/2014  . HLD (hyperlipidemia) 01/27/2014      Prior to Admission medications   Medication Sig Start Date End Date Taking? Authorizing Provider  amLODipine (NORVASC) 5 MG tablet Take 1 tablet (5 mg total) by mouth daily. 01/27/14   Esperanza Sheets, MD  aspirin EC 81 MG tablet Take 1 tablet (81 mg total) by mouth daily. 01/27/14   Esperanza Sheets, MD  atorvastatin (LIPITOR) 40 MG tablet Take 40 mg by mouth daily.  01/01/15   [provider]  benazepril-hydrochlorthiazide (LOTENSIN HCT) 20-25 MG per tablet Take 1 tablet by mouth daily. Patient taking differently: Take 0.5 tablets by mouth daily.  01/27/14   Esperanza Sheets, MD  cephALEXin (KEFLEX) 500 MG capsule Take 1 capsule (500 mg total) by mouth 3 (three) times daily. 11/16/17   Vanna Scotland, MD  Cholecalciferol (VITAMIN D3) 5000 UNITS CAPS Take 1 capsule by mouth See admin instructions. Take on Monday, Wednesday, and Friday    [provider]  dicyclomine (BENTYL) 20 MG tablet Take 1 tablet (20 mg total) by mouth 3 (three) times daily as needed for spasms. 06/08/17   Sharman Cheek, MD  esomeprazole (NEXIUM) 40 MG capsule Take 40 mg by mouth daily at 12 noon.    [provider]  HYDROcodone-acetaminophen (NORCO) 5-325 MG tablet Take 1 tablet by mouth every 4 (four) hours as needed for moderate pain. 11/16/17   Willy Eddy, MD  phenazopyridine (PYRIDIUM) 100 MG tablet Take 1 tablet (100 mg total) by mouth 3 (three) times daily as needed for up to 6 doses for pain. 11/16/17   Willy Eddy, MD  promethazine (PHENERGAN) 25 MG tablet Take 25 mg by mouth every 6 (six) hours as needed for nausea or vomiting.  [provider]  SUMAtriptan (IMITREX) 100 MG tablet Take 1 tablet (100 mg total) by mouth every 2 (two) hours as needed for migraine. May repeat in 2 hours if headache persists or recurs. 01/27/14   Esperanza Sheets, MD  traZODone (DESYREL) 150 MG tablet Take 300 mg by mouth at bedtime.  12/07/14   [provider]    Allergies Ciprofloxacin; Macrobid [nitrofurantoin monohyd macro]; and Sulfa antibiotics    Social History Social History   Tobacco Use  . Smoking status: Current Every Day Smoker    Packs/day: 0.50    Types: Cigarettes  . Smokeless tobacco: Never Used  Substance Use Topics  . Alcohol use: No  . Drug use: No    Review of Systems Patient denies headaches, rhinorrhea,  blurry vision, numbness, shortness of breath, chest pain, edema, cough, abdominal pain, nausea, vomiting, diarrhea, dysuria, fevers, rashes or hallucinations unless otherwise stated above in HPI. ____________________________________________   PHYSICAL EXAM:  VITAL SIGNS: Vitals:   11/16/17 1542  BP: 117/80  Pulse: 71  Resp: 14  Temp: 97.8 F (36.6 C)  SpO2: 100%    Constitutional: Alert and oriented. Well appearing and in no acute distress. Eyes: Conjunctivae are normal.  Head: Atraumatic. Nose: No congestion/rhinnorhea. Mouth/Throat: Mucous membranes are moist.   Neck: No stridor. Painless ROM.  Cardiovascular: Normal rate, regular rhythm. Grossly normal heart sounds.  Good peripheral circulation. Respiratory: Normal respiratory effort.  No retractions. Lungs CTAB. Gastrointestinal: Soft and nontender. No distention. No abdominal bruits. No CVA tenderness. Genitourinary: No evidence of bleeding or blood in the vaginal vault.  There is some irritation around the urethra but no evidence of laceration or blood. Musculoskeletal: No lower extremity tenderness nor edema.  No joint effusions. Neurologic:  Normal speech and language. No gross focal neurologic deficits are appreciated. No facial droop Skin:  Skin is warm, dry and intact. No rash noted. Psychiatric: Mood and affect are normal. Speech and behavior are normal.  ____________________________________________   LABS (all labs ordered are listed, but only abnormal results are displayed)  Results for orders placed or performed during the hospital encounter of 11/16/17 (from the past 24 hour(s))  CBC with Differential/Platelet     Status: Abnormal   Collection Time: 11/16/17  4:59 PM  Result Value Ref Range   WBC 8.9 3.6 - 11.0 K/uL   RBC 3.99 3.80 - 5.20 MIL/uL   Hemoglobin 12.7 12.0 - 16.0 g/dL   HCT 40.9 81.1 - 91.4 %   MCV 92.3 80.0 - 100.0 fL   MCH 31.8 26.0 - 34.0 pg   MCHC 34.5 32.0 - 36.0 g/dL   RDW 78.2 95.6 -  21.3 %   Platelets 233 150 - 440 K/uL   Neutrophils Relative % 82 %   Neutro Abs 7.2 (H) 1.4 - 6.5 K/uL   Lymphocytes Relative 12 %   Lymphs Abs 1.1 1.0 - 3.6 K/uL   Monocytes Relative 6 %   Monocytes Absolute 0.5 0.2 - 0.9 K/uL   Eosinophils Relative 0 %   Eosinophils Absolute 0.0 0 - 0.7 K/uL   Basophils Relative 0 %   Basophils Absolute 0.0 0 - 0.1 K/uL  Basic metabolic panel     Status: Abnormal   Collection Time: 11/16/17  4:59 PM  Result Value Ref Range   Sodium 129 (L) 135 - 145 mmol/L   Potassium 3.0 (L) 3.5 - 5.1 mmol/L   Chloride 95 (L) 101 - 111 mmol/L   CO2 25 22 -  32 mmol/L   Glucose, Bld 104 (H) 65 - 99 mg/dL   BUN 8 6 - 20 mg/dL   Creatinine, Ser 1.61 0.44 - 1.00 mg/dL   Calcium 8.5 (L) 8.9 - 10.3 mg/dL   GFR calc non Af Amer >60 >60 mL/min   GFR calc Af Amer >60 >60 mL/min   Anion gap 9 5 - 15   ____________________________________________  EKG____________________________________________  RADIOLOGY   ____________________________________________   PROCEDURES  Procedure(s) performed:  Procedures    Critical Care performed: no ____________________________________________   INITIAL IMPRESSION / ASSESSMENT AND PLAN / ED COURSE  Pertinent labs & imaging results that were available during my care of the patient were reviewed by me and considered in my medical decision making (see chart for details).  DDX: urethritis, uti, laceration  GELSEY AMYX is a 57 y.o. who presents to the ED with complaint of hematuria and pain with urination after foley catheter removal.  Patient able to void.  Well appearing and abdominal exam is soft and benign.  Will perform pelvic exam.    Clinical Course as of Nov 16 1908  Thu Nov 16, 2017  1901 Pelvic exam shows no abnormality.  No evidence of vaginal bleeding.  No evidence of bleeding from the urethra.  There is a little bit of urethral irritation likely secondary to chronic indwelling Foley.  No evidence of  leukocytosis.  Patient has been started on antibiotics for urethritis.  Will also start on pyridoxine.  Do suspect some component of urethral spasm and irritation.  Will give short course of pain medication as patient was having significant discomfort.  Patient appropriate for outpatient follow-up.   [PR]    Clinical Course User Index [PR] Willy Eddy, MD     As part of my medical decision making, I reviewed the following data within the electronic MEDICAL RECORD NUMBER Nursing notes reviewed and incorporated, Labs reviewed, notes from prior ED visits.   ____________________________________________   FINAL CLINICAL IMPRESSION(S) / ED DIAGNOSES  Final diagnoses:  Dysuria      NEW MEDICATIONS STARTED DURING THIS VISIT:  New Prescriptions   HYDROCODONE-ACETAMINOPHEN (NORCO) 5-325 MG TABLET    Take 1 tablet by mouth every 4 (four) hours as needed for moderate pain.   PHENAZOPYRIDINE (PYRIDIUM) 100 MG TABLET    Take 1 tablet (100 mg total) by mouth 3 (three) times daily as needed for up to 6 doses for pain.     Note:  This document was prepared using Dragon voice recognition software and may include unintentional dictation errors.    Willy Eddy, MD 11/16/17 1911

## 2017-11-16 NOTE — Telephone Encounter (Signed)
Patient called back to let you know that she was currently in the ED her BP had dropped and she was still bleeding. Just wanted [place] to be aware. I spoke with Maralyn Sago and she said that we had already told the patient that the bleeding was normal. As for her BP that  would need to be monitored by the ED and she was where she needed to be.  Marcelino Duster

## 2017-11-16 NOTE — ED Notes (Signed)
Pt presents with bleeding, thinks vaginal, but states it started when catheter was removed. Pt states it was placed in February and has not been changed since then. She did not have transportation to get back to urologist. Pt alert & oriented with NAD noted.

## 2017-11-16 NOTE — Addendum Note (Signed)
Addended by: Honor Loh on: 11/16/2017 02:24 PM   Modules accepted: Orders

## 2017-11-16 NOTE — ED Triage Notes (Signed)
Pt to ED c/o vaginal bleeding today, states had foley catheter in place since February and it was removed around noon today and has had vaginal bleeding since, denies clots "like i'm on my period".

## 2017-11-16 NOTE — Telephone Encounter (Signed)
Error

## 2017-11-16 NOTE — Progress Notes (Signed)
Catheter Removal  Patient is present today for a catheter removal.  7ml of water was drained from the balloon. A 16FR foley cath was removed from the bladder no complications were noted . Patient tolerated well.  Preformed by: Teressa Lower, CMA  The catheter had been in place since 07/17/2017. She called today complaining of bladder pain. After the catheter was removed a urine was collected for UA, UCX. A PVR was performed with the result of . Because the catheter was in place for so long Per Dr. Apolinar Junes a shot of Rocephin was given also a RX for Keflex was printed for the patient to take to pharmacy to get filled.

## 2017-11-17 ENCOUNTER — Ambulatory Visit: Payer: Medicaid Other

## 2017-11-19 LAB — CULTURE, URINE COMPREHENSIVE

## 2017-11-20 ENCOUNTER — Telehealth: Payer: Self-pay

## 2017-11-20 NOTE — Telephone Encounter (Signed)
-----   Message from Riki AltesScott C Stoioff, MD sent at 11/20/2017  7:25 AM EDT ----- Urine culture was negative for infection.

## 2017-11-20 NOTE — Telephone Encounter (Signed)
Pt informed

## 2018-01-14 ENCOUNTER — Emergency Department: Payer: Medicaid Other

## 2018-01-14 ENCOUNTER — Emergency Department
Admission: EM | Admit: 2018-01-14 | Discharge: 2018-01-14 | Disposition: A | Discharge status: Home / Self Care | Payer: Medicaid Other | Attending: Emergency Medicine | Admitting: Emergency Medicine

## 2018-01-14 ENCOUNTER — Encounter: Payer: Self-pay | Admitting: Emergency Medicine

## 2018-01-14 DIAGNOSIS — F17210 Nicotine dependence, cigarettes, uncomplicated: Secondary | ICD-10-CM | POA: Insufficient documentation

## 2018-01-14 DIAGNOSIS — M5442 Lumbago with sciatica, left side: Secondary | ICD-10-CM | POA: Diagnosis not present

## 2018-01-14 DIAGNOSIS — G8929 Other chronic pain: Secondary | ICD-10-CM

## 2018-01-14 DIAGNOSIS — R10814 Left lower quadrant abdominal tenderness: Secondary | ICD-10-CM | POA: Diagnosis not present

## 2018-01-14 DIAGNOSIS — M79605 Pain in left leg: Secondary | ICD-10-CM | POA: Insufficient documentation

## 2018-01-14 DIAGNOSIS — N390 Urinary tract infection, site not specified: Secondary | ICD-10-CM | POA: Diagnosis not present

## 2018-01-14 DIAGNOSIS — Z79899 Other long term (current) drug therapy: Secondary | ICD-10-CM | POA: Insufficient documentation

## 2018-01-14 DIAGNOSIS — Z7982 Long term (current) use of aspirin: Secondary | ICD-10-CM | POA: Insufficient documentation

## 2018-01-14 DIAGNOSIS — R300 Dysuria: Secondary | ICD-10-CM | POA: Diagnosis not present

## 2018-01-14 DIAGNOSIS — J449 Chronic obstructive pulmonary disease, unspecified: Secondary | ICD-10-CM | POA: Diagnosis not present

## 2018-01-14 DIAGNOSIS — M545 Low back pain, unspecified: Secondary | ICD-10-CM

## 2018-01-14 DIAGNOSIS — M5441 Lumbago with sciatica, right side: Secondary | ICD-10-CM | POA: Diagnosis not present

## 2018-01-14 DIAGNOSIS — I10 Essential (primary) hypertension: Secondary | ICD-10-CM | POA: Diagnosis not present

## 2018-01-14 LAB — URINALYSIS, COMPLETE (UACMP) WITH MICROSCOPIC
Bacteria, UA: NONE SEEN
Bilirubin Urine: NEGATIVE
Glucose, UA: NEGATIVE mg/dL
Ketones, ur: NEGATIVE mg/dL
Nitrite: NEGATIVE
Protein, ur: NEGATIVE mg/dL
Specific Gravity, Urine: 1.004 — ABNORMAL LOW (ref 1.005–1.030)
pH: 7 (ref 5.0–8.0)

## 2018-01-14 MED ORDER — PREDNISONE 20 MG PO TABS
60.0000 mg | ORAL_TABLET | ORAL | Status: AC
  Administered 2018-01-14: 60 mg via ORAL
  Filled 2018-01-14: qty 3

## 2018-01-14 MED ORDER — HYDROCODONE-ACETAMINOPHEN 5-325 MG PO TABS: 1.0000 | ORAL_TABLET | Freq: Four times a day (QID) | ORAL | 0 refills | Status: DC | PRN

## 2018-01-14 MED ORDER — PREDNISONE 10 MG PO TABS: ORAL_TABLET | ORAL | 0 refills | Status: DC

## 2018-01-14 MED ORDER — CYCLOBENZAPRINE HCL 10 MG PO TABS
5.0000 mg | ORAL_TABLET | Freq: Once | ORAL | Status: AC
  Administered 2018-01-14: 5 mg via ORAL
  Filled 2018-01-14: qty 1

## 2018-01-14 MED ORDER — KETOROLAC TROMETHAMINE 30 MG/ML IJ SOLN
15.0000 mg | Freq: Once | INTRAMUSCULAR | Status: AC
  Administered 2018-01-14: 15 mg via INTRAMUSCULAR

## 2018-01-14 MED ORDER — HYDROCODONE-ACETAMINOPHEN 5-325 MG PO TABS
2.0000 | ORAL_TABLET | Freq: Once | ORAL | Status: AC
  Administered 2018-01-14: 2 via ORAL
  Filled 2018-01-14: qty 2

## 2018-01-14 MED ORDER — CYCLOBENZAPRINE HCL 5 MG PO TABS: 5.0000 mg | ORAL_TABLET | Freq: Three times a day (TID) | ORAL | 0 refills | Status: DC | PRN

## 2018-01-14 MED ORDER — CEPHALEXIN 500 MG PO CAPS: 500.0000 mg | ORAL_CAPSULE | Freq: Two times a day (BID) | ORAL | 0 refills | Status: DC

## 2018-01-14 NOTE — Discharge Instructions (Signed)
You have been seen in the Emergency Department (ED)  today for back pain.  Your workup and exam have not shown any acute abnormalities and you are likely suffering from muscle strain or possible problems with your discs, but there is no treatment that will fix your symptoms at this time.  Please take Tylenol 1000 mg four times daily.  Once your prednisone prescription is gone, we recommend you start taking ibuprofen 600 mg three times daily with meals.  Take Norco as prescribed for severe pain. Do not drink alcohol, drive or participate in any other potentially dangerous activities while taking this medication as it may make you sleepy. Do not take this medication with any other sedating medications, either prescription or over-the-counter. If you were prescribed Percocet or Vicodin, do not take these with acetaminophen (Tylenol) as it is already contained within these medications.   This medication is an opiate (or narcotic) pain medication and can be habit forming.  Use it as little as possible to achieve adequate pain control.  Do not use or use it with extreme caution if you have a history of opiate abuse or dependence.  If you are on a pain contract with your primary care doctor or a pain specialist, be sure to let them know you were prescribed this medication today from the [facility].  This medication is intended for your use only - do not give any to anyone else and keep it in a secure place where nobody else, especially children, have access to it.  It will also cause or worsen constipation, so you may want to consider taking an over-the-counter stool softener while you are taking this medication.  Please follow up with your doctor as soon as possible regarding today's ED visit and your back pain.  Return to the ED for worsening back pain, fever, weakness or numbness of either leg, or if you develop either (1) an inability to urinate or have bowel movements, or (2) loss of  your ability to control your bathroom functions (if you start having "accidents"), or if you develop other new symptoms that concern you.  Also take the full course of antibiotics prescribed and follow up with your urologist to discuss your urinary symptoms.

## 2018-01-14 NOTE — ED Notes (Signed)
Patient requesting more pain medication. Dr York CeriseForbach notified.

## 2018-01-14 NOTE — ED Provider Notes (Signed)
[facility] Emergency Department Provider Note  ____________________________________________   First MD Initiated Contact with Patient 01/14/18 1756     (approximate)  I have reviewed the triage vital signs and the nursing notes.   HISTORY  Chief Complaint Leg Swelling and Back Pain    HPI Katelyn [name] is a 57 y.o. female with medical history as listed below who presents for evaluation of several weeks of pain starting in her low back and radiating down her left leg.  She also reports pain up higher in her back and dysuria that started at least a week ago but has been getting worse.  She reports that the pain when she urinates is burning and severe and she has been going to the bathroom more frequently.  She has a history of urinary retention and had a Foley catheter in place for an extended period of time.  The catheter was only taken out just under 2 months ago.  She has been urinating without difficulty until just recently when the dysuria started back up.  She has no history of kidney stones.  She denies fever/chills, chest pain, shortness of breath, nausea, vomiting, and abdominal pain.  The pain is primarily in her back in the midline but radiating to both sides and then radiating down her left leg all the way to the foot.  She says it feels similar to some chronic right-sided sciatica that she has that stops only in her hip.  She is not having any difficulty with ambulation.  She has no numbness nor tingling in her pelvis.  She is ambulatory without any difficulty at this time.  When I went to see her she asked if I could come back in a few minutes and she jumped up out of bed and was able to go to the bathroom without any trouble weightbearing or ambulating.  Past Medical History:  Diagnosis Date  . COPD (chronic obstructive pulmonary disease) (HCC)   . GERD (gastroesophageal reflux disease)   . Hiatal hernia   . HLD (hyperlipidemia)   . Hypertension     . Smoking     Patient Active Problem List   Diagnosis Date Noted  . Urinary retention 07/17/2017  . Chest pain 01/27/2014  . HTN (hypertension) 01/27/2014  . HLD (hyperlipidemia) 01/27/2014    Past Surgical History:  Procedure Laterality Date  . ABDOMINAL HYSTERECTOMY    . ADENOIDECTOMY    . CHOLECYSTECTOMY    . HERNIA REPAIR    . NECK SURGERY    . SHOULDER SURGERY    . TONSILLECTOMY      Prior to Admission medications   Medication Sig Start Date End Date Taking? Authorizing Provider  amLODipine (NORVASC) 5 MG tablet Take 1 tablet (5 mg total) by mouth daily. 01/27/14   Esperanza Sheets, MD  aspirin EC 81 MG tablet Take 1 tablet (81 mg total) by mouth daily. 01/27/14   Esperanza Sheets, MD  atorvastatin (LIPITOR) 40 MG tablet Take 40 mg by mouth daily. 01/01/15   [provider]  benazepril-hydrochlorthiazide (LOTENSIN HCT) 20-25 MG per tablet Take 1 tablet by mouth daily. Patient taking differently: Take 0.5 tablets by mouth daily.  01/27/14   Esperanza Sheets, MD  cephALEXin (KEFLEX) 500 MG capsule Take 1 capsule (500 mg total) by mouth 2 (two) times daily. 01/14/18   Loleta Rose, MD  Cholecalciferol (VITAMIN D3) 5000 UNITS CAPS Take 1 capsule by mouth See admin instructions. Take on Monday, Wednesday,  and Friday    [provider]  cyclobenzaprine (FLEXERIL) 5 MG tablet Take 1 tablet (5 mg total) by mouth 3 (three) times daily as needed for muscle spasms. 01/14/18   Loleta Rose, MD  dicyclomine (BENTYL) 20 MG tablet Take 1 tablet (20 mg total) by mouth 3 (three) times daily as needed for spasms. 06/08/17   Sharman Cheek, MD  esomeprazole (NEXIUM) 40 MG capsule Take 40 mg by mouth daily at 12 noon.    [provider]  HYDROcodone-acetaminophen (NORCO/VICODIN) 5-325 MG tablet Take 1-2 tablets by mouth every 6 (six) hours as needed for moderate pain. 01/14/18   Loleta Rose, MD  phenazopyridine (PYRIDIUM) 100 MG tablet Take 1 tablet (100 mg total)  by mouth 3 (three) times daily as needed for up to 6 doses for pain. 11/16/17   Willy Eddy, MD  predniSONE (DELTASONE) 10 MG tablet Take 6 tabs (60 mg) PO x 3 days, then take 4 tabs (40 mg) PO x 3 days, then take 2 tabs (20 mg) PO x 3 days, then take 1 tab (10 mg) PO x 3 days, then take 1/2 tab (5 mg) PO x 4 days. 01/14/18   Loleta Rose, MD  promethazine (PHENERGAN) 25 MG tablet Take 25 mg by mouth every 6 (six) hours as needed for nausea or vomiting.    [provider]  SUMAtriptan (IMITREX) 100 MG tablet Take 1 tablet (100 mg total) by mouth every 2 (two) hours as needed for migraine. May repeat in 2 hours if headache persists or recurs. 01/27/14   Esperanza Sheets, MD  traZODone (DESYREL) 150 MG tablet Take 300 mg by mouth at bedtime.  12/07/14   [provider]    Allergies Ciprofloxacin; Macrobid [nitrofurantoin monohyd macro]; and Sulfa antibiotics  Family History  Problem Relation Age of Onset  . CAD Father        onset age 41  . Bladder Cancer Neg Hx   . Kidney cancer Neg Hx     Social History Social History   Tobacco Use  . Smoking status: Current Every Day Smoker    Packs/day: 0.50    Types: Cigarettes  . Smokeless tobacco: Never Used  Substance Use Topics  . Alcohol use: No  . Drug use: No    Review of Systems Constitutional: No fever/chills Eyes: No visual changes. ENT: No sore throat. Cardiovascular: Denies chest pain. Respiratory: Denies shortness of breath. Gastrointestinal: No abdominal pain.  No nausea, no vomiting.  No diarrhea.  No constipation. Genitourinary: Dysuria and increased urinary frequency Musculoskeletal: Chronic low back pain, worse recently, also into the flanks and radiating down the back of the left leg. Integumentary: Negative for rash. Neurological: Negative for headaches, focal weakness or numbness.   ____________________________________________   PHYSICAL EXAM:  VITAL SIGNS: ED Triage Vitals [01/14/18  1608]  Enc Vitals Group     BP 126/79     Pulse Rate 82     Resp 18     Temp 98 F (36.7 C)     Temp Source Oral     SpO2 99 %     Weight 72.6 kg (160 lb)     Height 1.651 m (5\' 5" )     Head Circumference      Peak Flow      Pain Score 10     Pain Loc      Pain Edu?      Excl. in GC?     Constitutional: Alert and oriented.  Well appearing and in no acute distress. Eyes: Conjunctivae are normal.  Head: Atraumatic. Nose: No congestion/rhinnorhea. Mouth/Throat: Mucous membranes are moist. Neck: No stridor.  No meningeal signs.   Cardiovascular: Normal rate, regular rhythm. Good peripheral circulation. Grossly normal heart sounds. Respiratory: Normal respiratory effort.  No retractions. Lungs CTAB. Gastrointestinal: Soft and nontender. No distention.  Musculoskeletal: Trace peripheral edema equal in bilateral lower extremities. No gross deformities of extremities.  Bilateral CVA tenderness to percussion.  No step-offs or deformities or tenderness to palpation of the thoracic or lumbar spine and no fluctuance or induration. Neurologic:  Normal speech and language. No gross focal neurologic deficits are appreciated.  Skin:  Skin is warm, dry and intact. No rash noted. Psychiatric: Mood and affect are normal. Speech and behavior are normal.  ____________________________________________   LABS (all labs ordered are listed, but only abnormal results are displayed)  Labs Reviewed  URINALYSIS, COMPLETE (UACMP) WITH MICROSCOPIC - Abnormal; Notable for the following components:      Result Value   Color, Urine STRAW (*)    APPearance CLEAR (*)    Specific Gravity, Urine 1.004 (*)    Hgb urine dipstick SMALL (*)    Leukocytes, UA SMALL (*)    WBC, UA >50 (*)    All other components within normal limits  URINE CULTURE   ____________________________________________  EKG  None - EKG not ordered by ED physician ____________________________________________  RADIOLOGY   ED MD  interpretation: No indication of DVT nor Baker's cyst.  Disc bulge and foramen narrowing, chronic.  Official radiology report(s): US Venous Img Lower Unilateral Left  Result Date: 01/14/2018 CLINICAL DATA:  Left lower extremity pain and edema. EXAM: LEFT LOWER EXTREMITY VENOUS DOPPLER ULTRASOUND TECHNIQUE: Gray-scale sonography with graded compression, as well as color Doppler and duplex ultrasound were performed to evaluate the lower extremity deep venous systems from the level of the common femoral vein and including the common femoral, femoral, profunda femoral, popliteal and calf veins including the posterior tibial, peroneal and gastrocnemius veins when visible. The superficial great saphenous vein was also interrogated. Spectral Doppler was utilized to evaluate flow at rest and with distal augmentation maneuvers in the common femoral, femoral and popliteal veins. COMPARISON:  None. FINDINGS: Contralateral Common Femoral Vein: Respiratory phasicity is normal and symmetric with the symptomatic side. No evidence of thrombus. Normal compressibility. Common Femoral Vein: No evidence of thrombus. Normal compressibility, respiratory phasicity and response to augmentation. Saphenofemoral Junction: No evidence of thrombus. Normal compressibility and flow on color Doppler imaging. Profunda Femoral Vein: No evidence of thrombus. Normal compressibility and flow on color Doppler imaging. Femoral Vein: No evidence of thrombus. Normal compressibility, respiratory phasicity and response to augmentation. Popliteal Vein: No evidence of thrombus. Normal compressibility, respiratory phasicity and response to augmentation. Calf Veins: No evidence of thrombus. Normal compressibility and flow on color Doppler imaging. Superficial Great Saphenous Vein: No evidence of thrombus. Normal compressibility. Venous Reflux:  None. Other Findings: No evidence of superficial thrombophlebitis or abnormal fluid collection. IMPRESSION: No  evidence of left lower extremity deep venous thrombosis. Electronically Signed   By: Irish Lack M.D.   On: 01/14/2018 17:14   Ct L-spine No Charge  Result Date: 01/14/2018 CLINICAL DATA:  Left leg pain x several weeks, low back pain EXAM: CT LUMBAR SPINE WITHOUT CONTRAST TECHNIQUE: Multidetector CT imaging of the lumbar spine was performed without intravenous contrast administration. Multiplanar CT image reconstructions were also generated. COMPARISON:  CT abdomen/pelvis dated 06/29/2017 FINDINGS: Segmentation: 5 lumbar-type vertebral bodies.  Alignment: Normal lumbar lordosis. Grade 1 anterolisthesis of L4 on L5, chronic. Vertebrae: Vertebral body heights and intervertebral disc spaces are maintained. Paraspinal and other soft tissues: Better evaluated on dedicated CT abdomen/pelvis. Disc levels: L1-2: Patent.  Mild facet arthropathy. L2-3: Patent.  Mild diffuse disc bulge.  Mild facet arthropathy. L3-4: Patent. Mild diffuse disc bulge. Mild facet arthropathy with ligamentum flavum hypertrophy. L4-5: Grade 1 anterolisthesis of L4 on L5 with associated moderate diffuse disc bulge and moderate to severe central canal narrowing. Moderate facet arthropathy with ligamentum flavum hypertrophy. This appearance is chronic, dating back to at least 2014. L5-S1: Patent.  Mild facet arthropathy. IMPRESSION: Grade 1 anterolisthesis of L4 on L5. Associated moderate diffuse disc bulge and moderate to severe central canal narrowing. This appearance is chronic. Electronically Signed   By: Charline Bills M.D.   On: 01/14/2018 19:22   Ct Renal Stone Study  Result Date: 01/14/2018 CLINICAL DATA:  Left leg pain/swelling, low back pain, urinary frequency, dysuria EXAM: CT ABDOMEN AND PELVIS WITHOUT CONTRAST TECHNIQUE: Multidetector CT imaging of the abdomen and pelvis was performed following the standard protocol without IV contrast. COMPARISON:  06/29/2017 FINDINGS: Lower chest: Lung bases are clear. Hepatobiliary:  Unenhanced liver is unremarkable. Status post cholecystectomy. No intrahepatic or extrahepatic ductal dilatation. Pancreas: Within normal limits. Spleen: Within normal limits. Adrenals/Urinary Tract: Adrenal glands are within normal limits. 4 mm hyperdense/hemorrhagic cyst along the lateral interpolar right kidney (series 2/image 33). Left kidney is within normal limits. No renal or ureteral calculi. Layering crescenteric posterior bladder calculus measuring 2.0 cm (series 2/image 67). Bladder is otherwise within normal limits. Stomach/Bowel: Stomach is notable for a tiny hiatal hernia. No evidence of bowel obstruction. Normal appendix (series 2/image 64). No colonic wall thickening or inflammatory changes. Vascular/Lymphatic: No evidence of abdominal aortic aneurysm. Atherosclerotic calcifications of the abdominal aorta and branch vessels. No suspicious abdominopelvic lymphadenopathy. Reproductive: Status post hysterectomy. No adnexal masses. Other: No abdominopelvic ascites. Musculoskeletal: Grade 1 anterolisthesis of L4 on L5. Mild degenerative changes of the lower lumbar spine. IMPRESSION: 2.0 cm posterior bladder calculus. Consider cystoscopy as clinically warranted. No renal or ureteral calculi.  No hydronephrosis. No evidence of bowel obstruction.  Normal appendix. Status post cholecystectomy and hysterectomy. Electronically Signed   By: Charline Bills M.D.   On: 01/14/2018 19:15    ____________________________________________   PROCEDURES  Critical Care performed: No   Procedure(s) performed:   Procedures   ____________________________________________   INITIAL IMPRESSION / ASSESSMENT AND PLAN / ED COURSE  As part of my medical decision making, I reviewed the following data within the electronic MEDICAL RECORD NUMBER Nursing notes reviewed and incorporated, Labs reviewed , Old chart reviewed, Notes from prior ED visits and [place] Controlled Substance Database    Differential diagnosis  includes, but is not limited to, chronic back pain with sciatica, UTI/pyelonephritis, ureteral/renal colic, musculoskeletal strain, less likely shingles.  The patient is in no acute distress, ambulatory without any difficulty, and she does have tenderness to palpation but it seems more musculoskeletal.  The description of the pain radiating from her low back down through the back of her left lower extremity is consistent with sciatica and she has no focal neurological deficits.  She does appear to have a urinary tract infection and given the CVA tenderness and the possibility she could have a ureteral stone and her pyelonephritis, I ordered a CT renal study protocol.  I also requested lumbar spine reconstructions given that she has had problems with her low back for  a while.  In the meantime I treated the patient's pain with Toradol 15 mg intramuscular and will reassess after the results of her imaging.  She understands and agrees with the plan.   Clinical Course as of Jan 15 25  Wynelle LinkSun Jan 14, 2018  1922 I reviewed the patient's prescription history over the last 24 months in the multi-state controlled substances database(s) that includes [place], [place], [place], [place], [place], [place], [place], [place], [place], [place], [place], [place], [place], [place], and [place].  Results were notable for 3 prescriptions in the last 70 months for very small amounts of Norco, provided from 3 different visits to the emergency department.  Prior to that there was nothing for about a year and she had regular prescriptions for Valium from her primary care provider.  She is still low risk for abuse, but I will counsel her that she needs to follow-up with her urologist or a pain management clinic or back specialist for additional medications and that they will not be provided through the emergency department.   [CF]  2015 I discussed the results of the CT scans with the  patient.  She has bulging disks in her lumbar spine but no other acute abnormalities.  She does have a bladder calculus but I suspect this is the result of having an indwelling Foley catheter for an extended period of time.  I strongly encouraged her to follow-up with Dr. Yves Dillhasnis for her chronic back pain as well as with Dr. Apolinar JunesBrandon for her urological issues.  Keflex for her urinary tract infection and medications for her back pain and sciatica as listed below.  I gave my usual and customary return precautions.  She understands and agrees with the plan.   [CF]    Clinical Course User Index [CF] Loleta RoseForbach, Alycen Mack, MD    ____________________________________________  FINAL CLINICAL IMPRESSION(S) / ED DIAGNOSES  Final diagnoses:  Low back pain  Chronic bilateral low back pain with bilateral sciatica  Urinary tract infection without hematuria, site unspecified     MEDICATIONS GIVEN DURING THIS VISIT:  Medications  ketorolac (TORADOL) 30 MG/ML injection 15 mg (15 mg Intramuscular Given 01/14/18 1835)  HYDROcodone-acetaminophen (NORCO/VICODIN) 5-325 MG per tablet 2 tablet (2 tablets Oral Given 01/14/18 1939)  predniSONE (DELTASONE) tablet 60 mg (60 mg Oral Given 01/14/18 2030)  cyclobenzaprine (FLEXERIL) tablet 5 mg (5 mg Oral Given 01/14/18 2031)     ED Discharge Orders        Ordered    cephALEXin (KEFLEX) 500 MG capsule  2 times daily     01/14/18 2020    predniSONE (DELTASONE) 10 MG tablet     01/14/18 2020    cyclobenzaprine (FLEXERIL) 5 MG tablet  3 times daily PRN     01/14/18 2020    HYDROcodone-acetaminophen (NORCO/VICODIN) 5-325 MG tablet  Every 6 hours PRN     01/14/18 2020       Note:  This document was prepared using Dragon voice recognition software and may include unintentional dictation errors.    Loleta RoseForbach, Imer Foxworth, MD 01/15/18 [phone]

## 2018-01-14 NOTE — ED Triage Notes (Signed)
Patient presents to the ED with left leg pain for several weeks and patient states leg is now more painful and swollen.  Patient is also reporting lower back pain with urinary frequency and burning.

## 2018-01-16 LAB — URINE CULTURE: SPECIAL REQUESTS: NORMAL

## 2018-04-05 ENCOUNTER — Ambulatory Visit: Payer: Self-pay | Admitting: Physician Assistant

## 2018-04-16 ENCOUNTER — Ambulatory Visit: Payer: Medicaid Other | Admitting: Physician Assistant

## 2018-05-16 ENCOUNTER — Ambulatory Visit: Payer: Medicaid Other | Admitting: Physician Assistant

## 2018-07-02 ENCOUNTER — Ambulatory Visit: Payer: Self-pay | Admitting: Physician Assistant

## 2018-12-01 IMAGING — CR DG CHEST 2V
2 series · 2 of 2 positions shown · non-contrast
Comparison: 01/26/2014 chest radiograph

CLINICAL DATA: 56 y/o F; abdominal pain with nausea and vomiting
for 2 days. Left-sided chest pain today.

EXAM:
CHEST  2 VIEW

[chest lat]
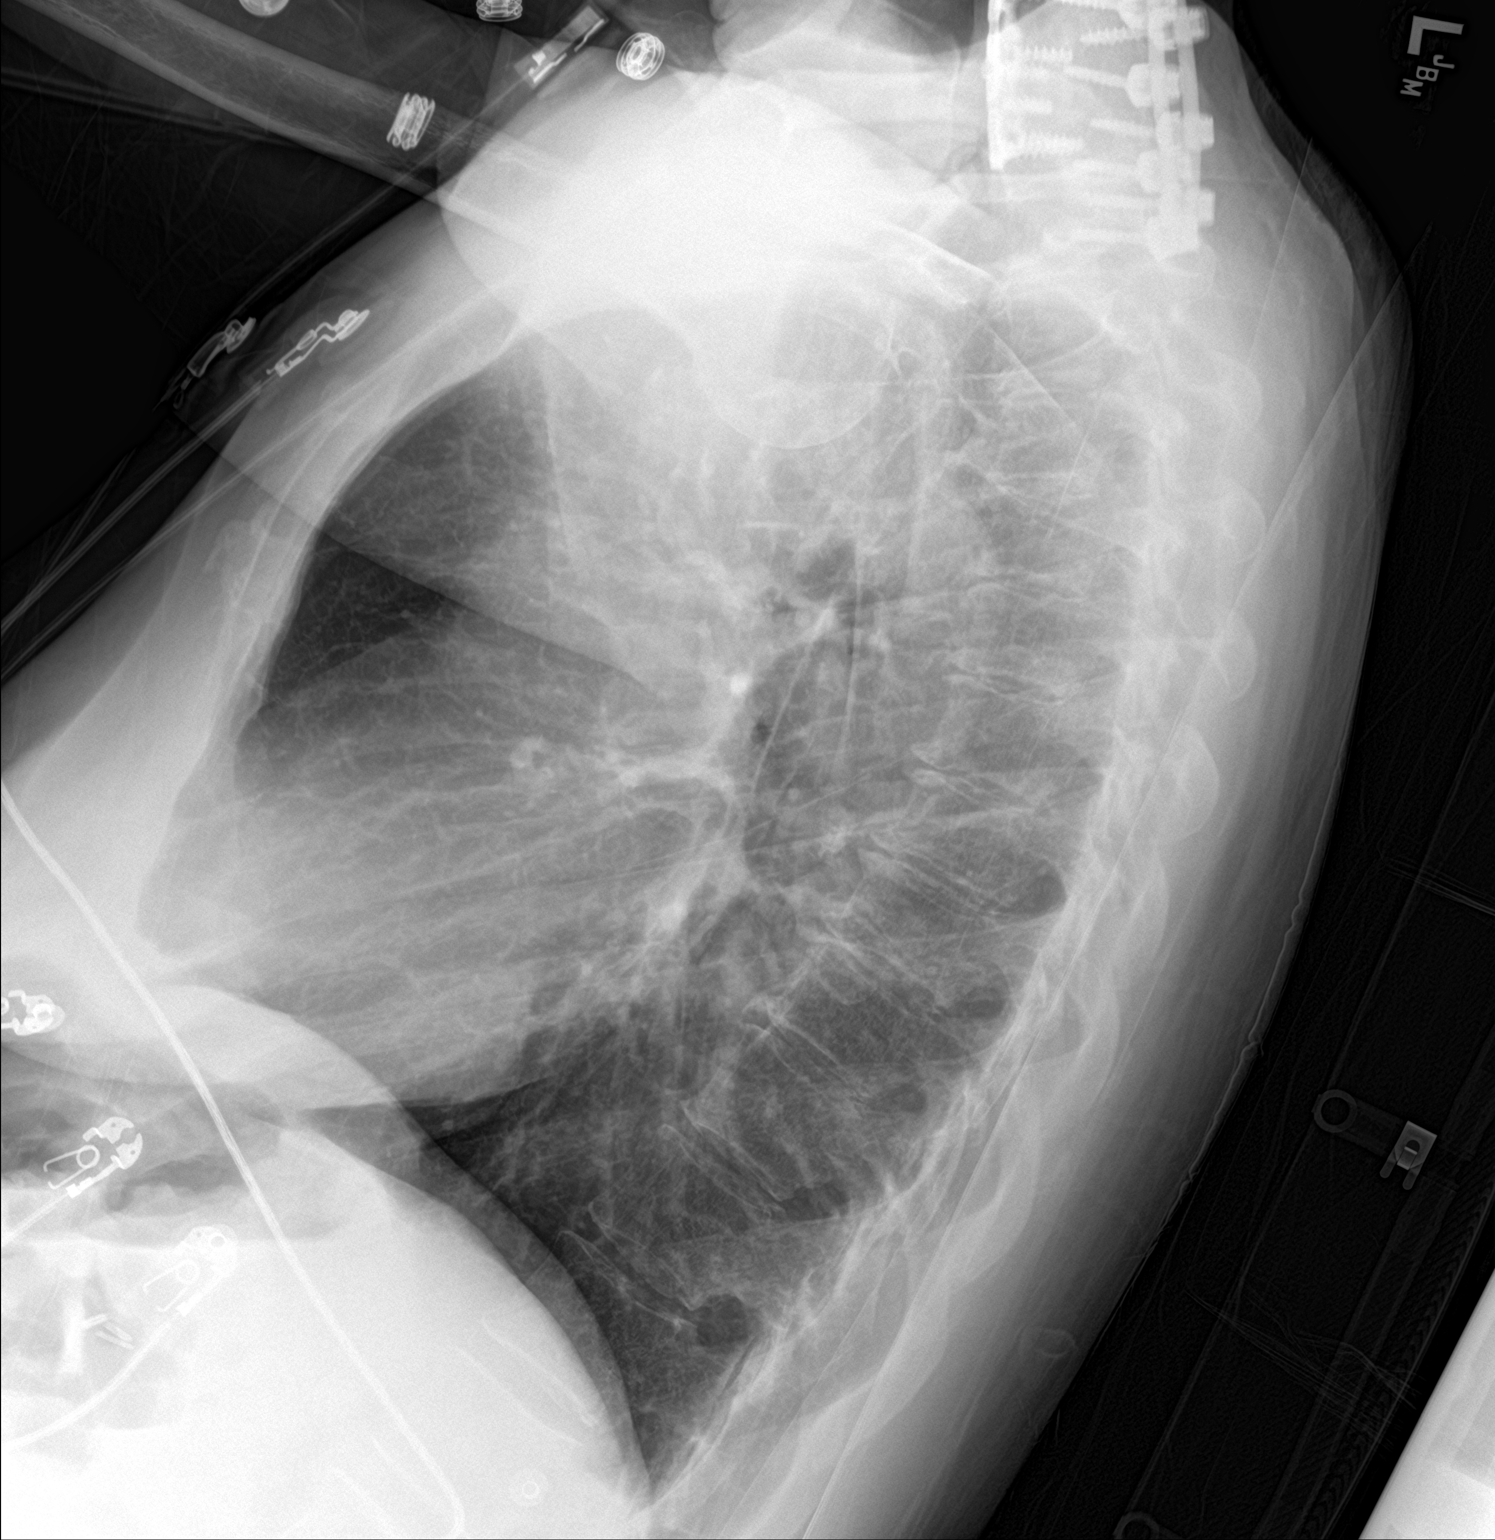

[chest ap]
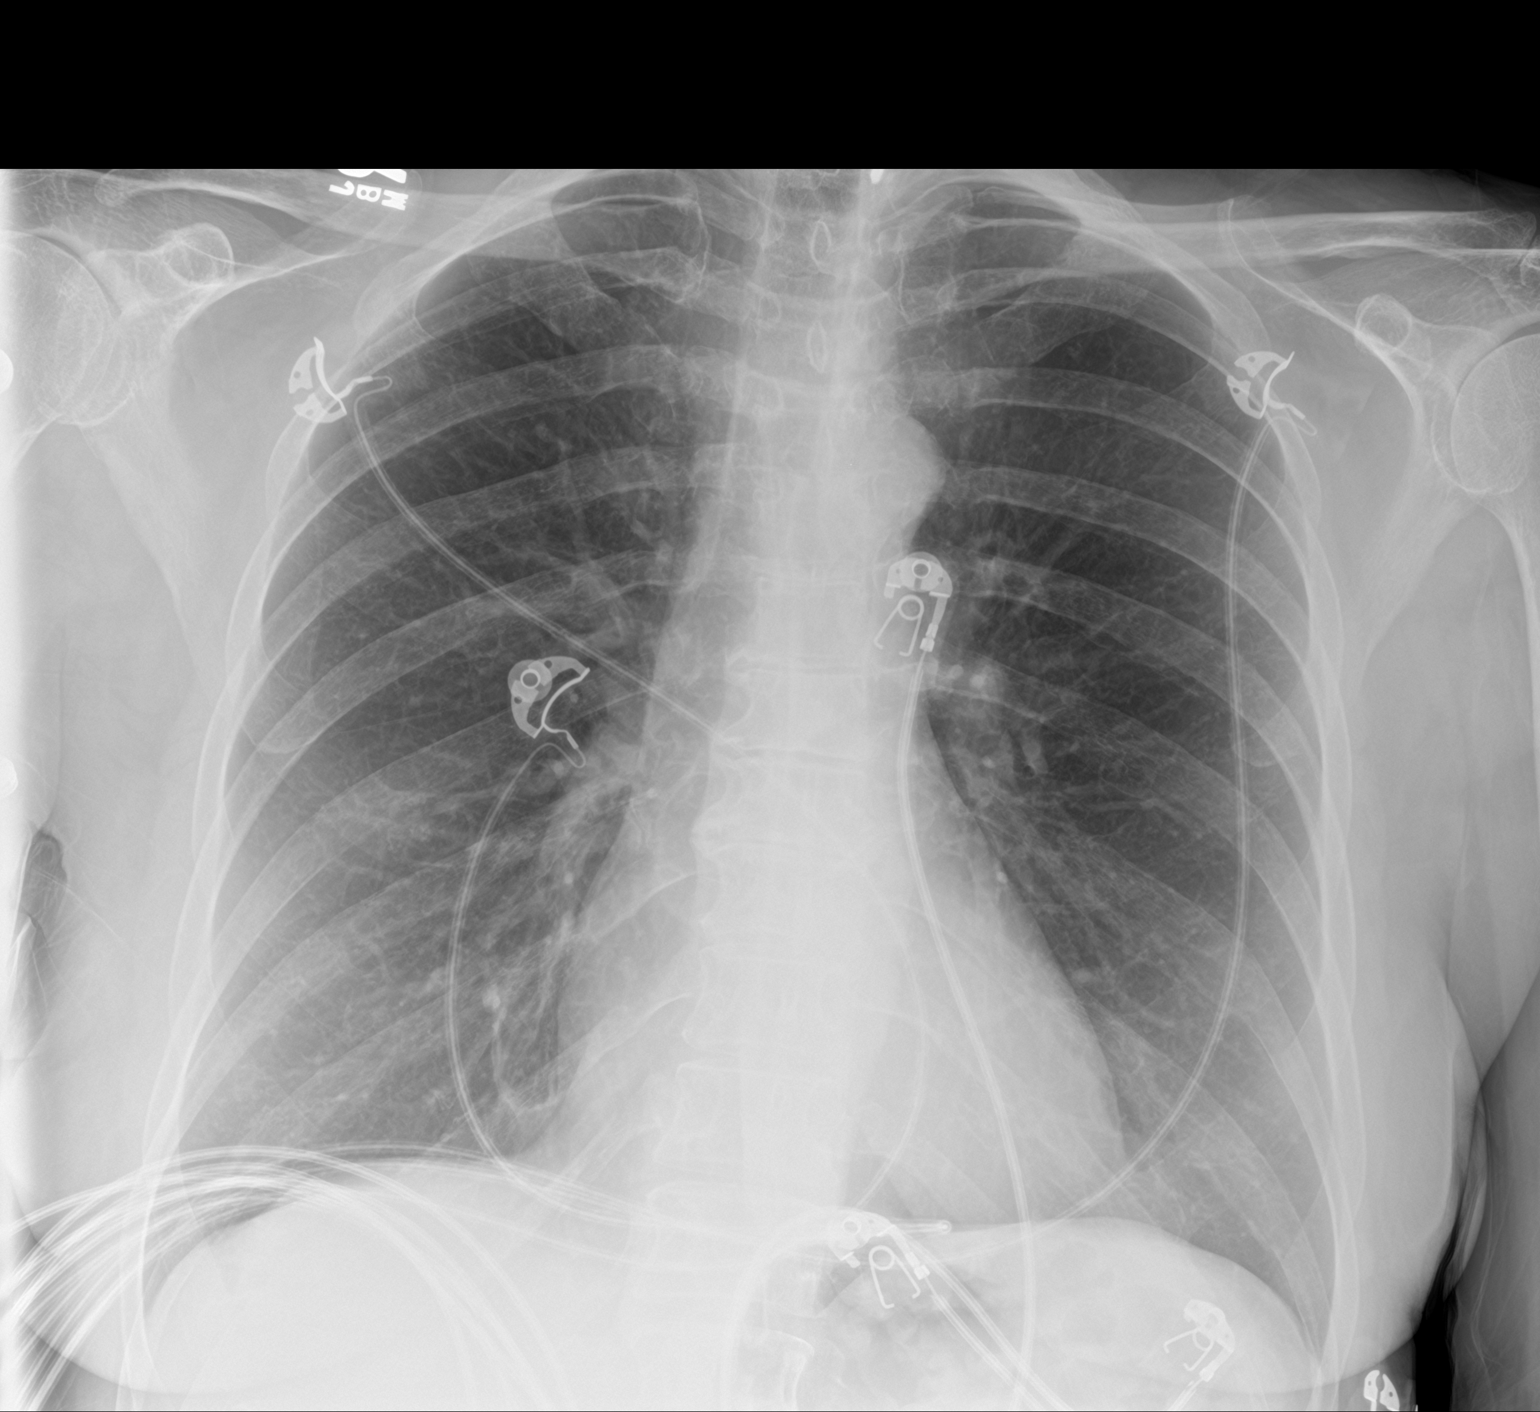

[2 of 2 positions shown; findings below may reference images not displayed]

FINDINGS: Normal cardiac silhouette. Anterior posterior cervical fusion
hardware partially visualized. Clear lungs. No pleural effusion or
pneumothorax. No acute osseous abnormality is evident.
IMPRESSION: No acute pulmonary process identified.

By: Savio Kapadia M.D.

## 2018-12-26 IMAGING — CT CT RENAL STONE PROTOCOL
2 of 4 series · 16 of 46 positions shown, 18 images · non-contrast
Comparison: CT abdomen pelvis dated May 14, 2017.

CLINICAL DATA: Left flank and bladder pain.

EXAM:
CT ABDOMEN AND PELVIS WITHOUT CONTRAST
TECHNIQUE: Multidetector CT imaging of the abdomen and pelvis was performed
following the standard protocol without IV contrast.

[Series 2: stone full standard (person_name) · axial · 0.58mm/px · z∈[-174,+206]mm · 13 of 84 slices shown, 15 images]
[im 4/84  soft-tissue]
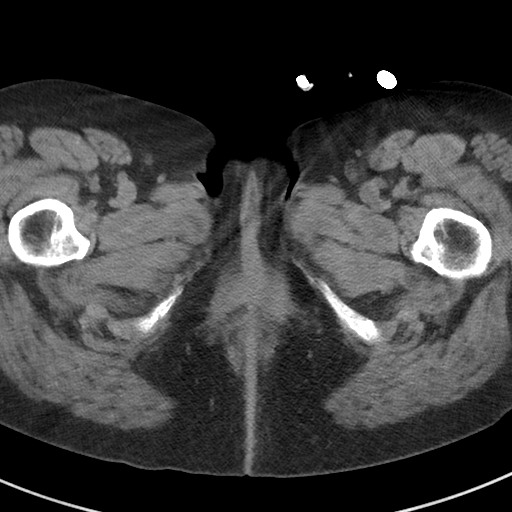
[im 4/84  bone]
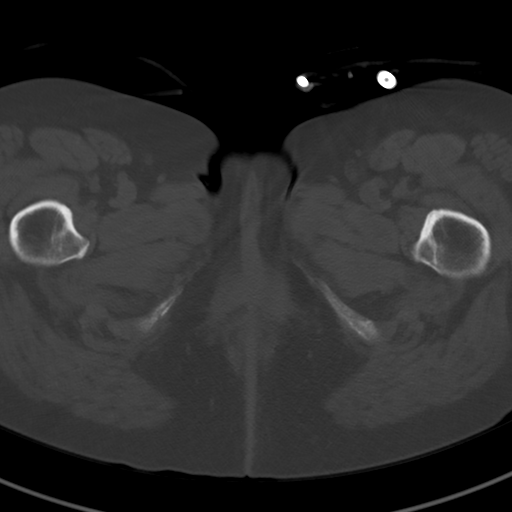
[im 10/84  soft-tissue]
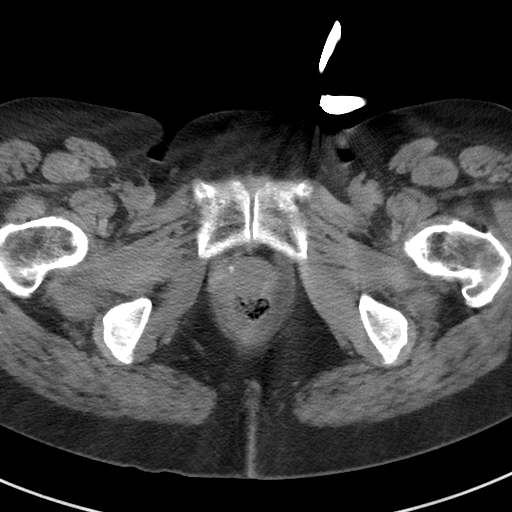
[im 17/84  soft-tissue]
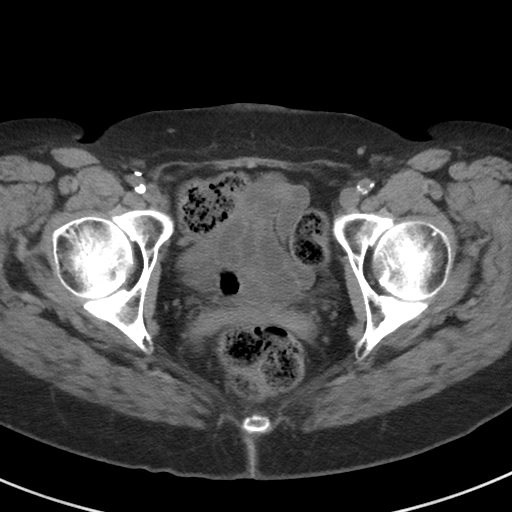
[im 24/84  soft-tissue]
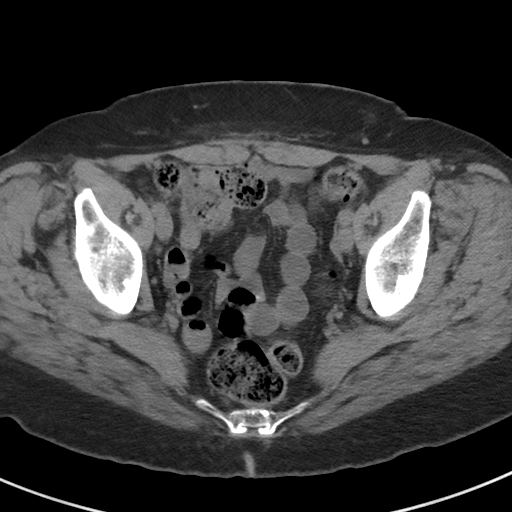
[im 30/84  soft-tissue]
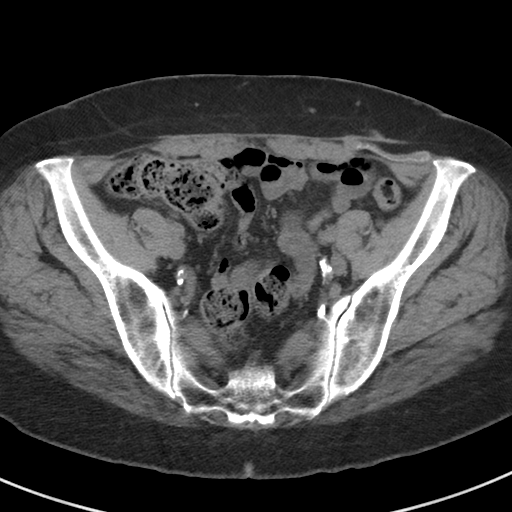
[im 37/84  soft-tissue]
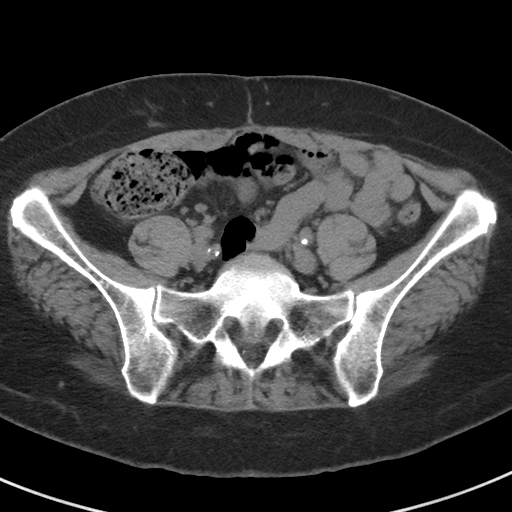
[im 44/84  soft-tissue]
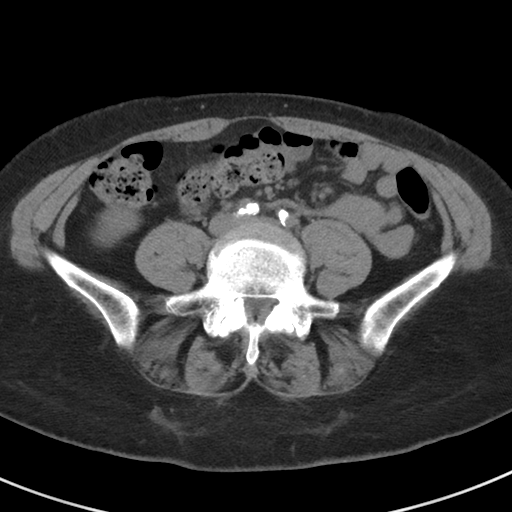
[im 47/84  soft-tissue]
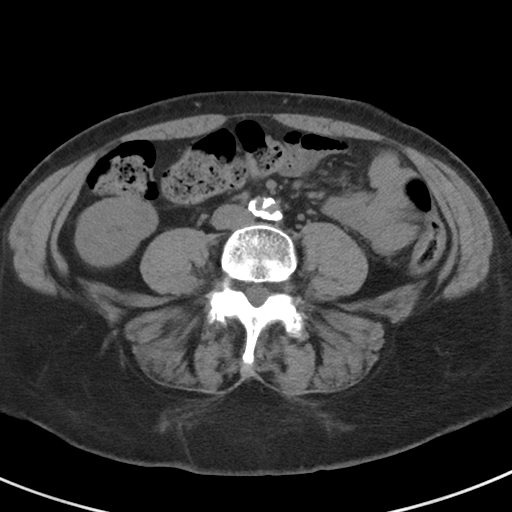
[im 54/84  soft-tissue]
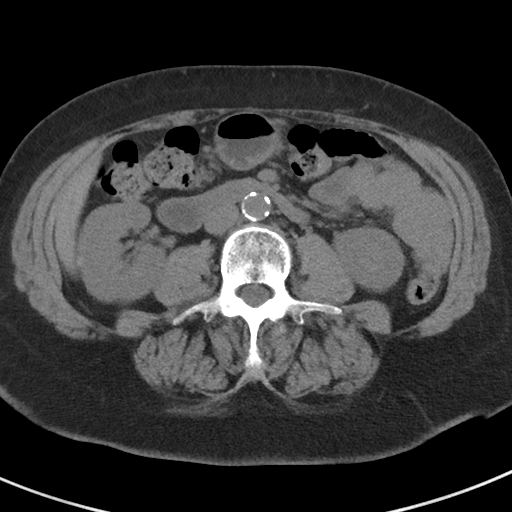
[im 54/84  bone]
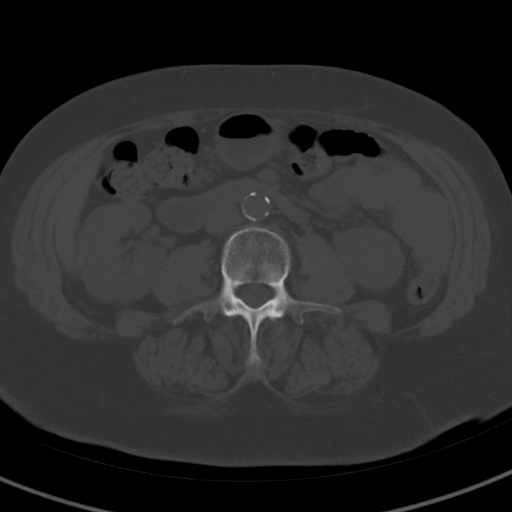
[im 60/84  soft-tissue]
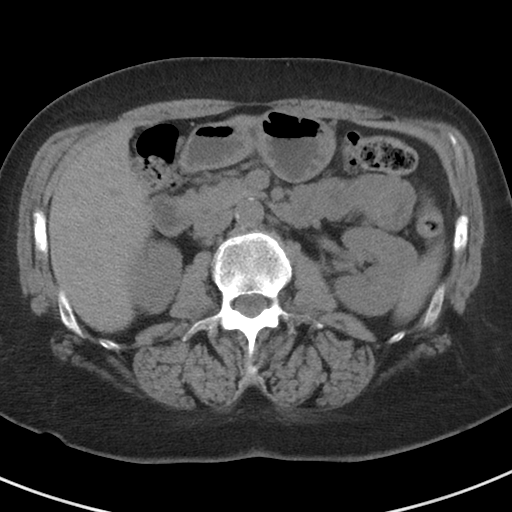
[im 67/84  soft-tissue]
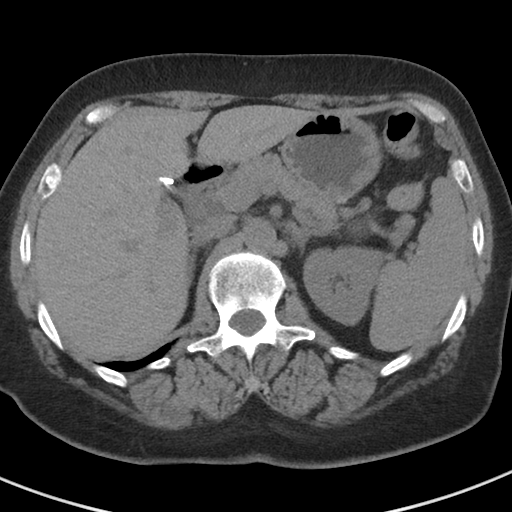
[im 74/84  soft-tissue]
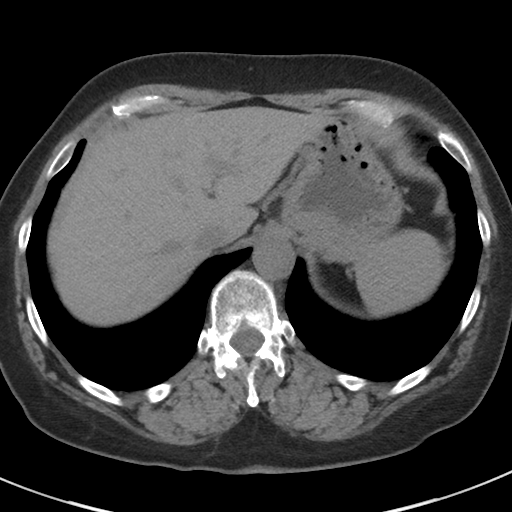
[im 80/84  soft-tissue]
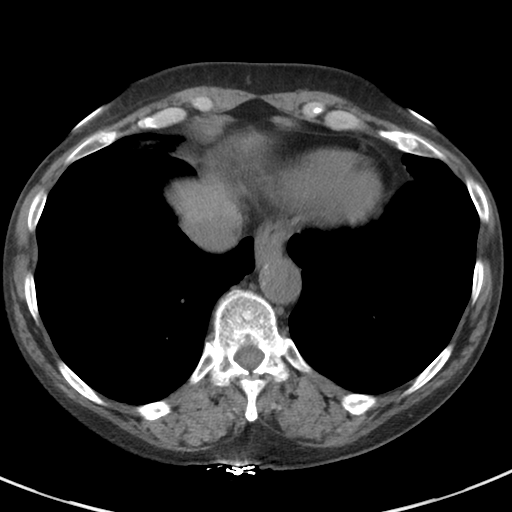

[Series 5: coronal · coronal · 0.66mm/px · 3 of 120 slices shown]
[im 40/120  soft-tissue]
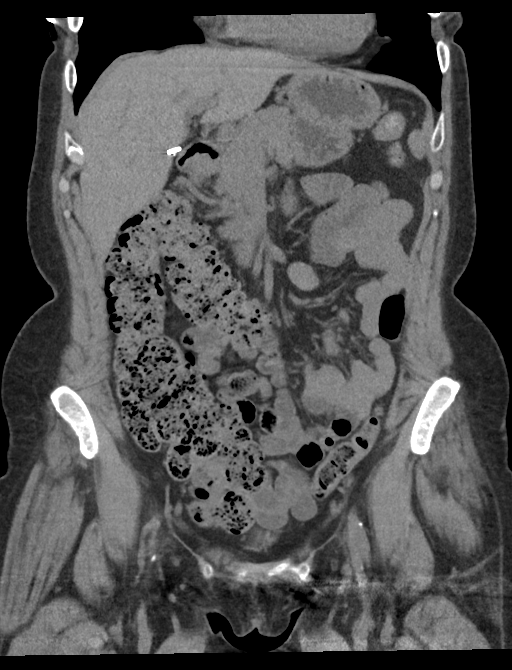
[im 53/120  soft-tissue]
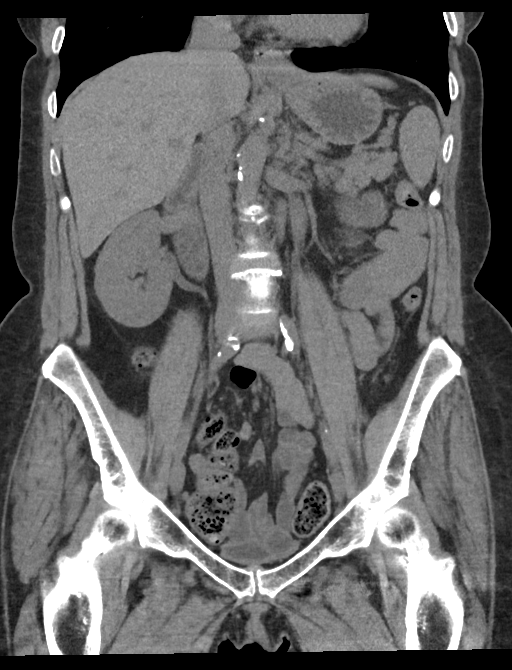
[im 67/120  soft-tissue]
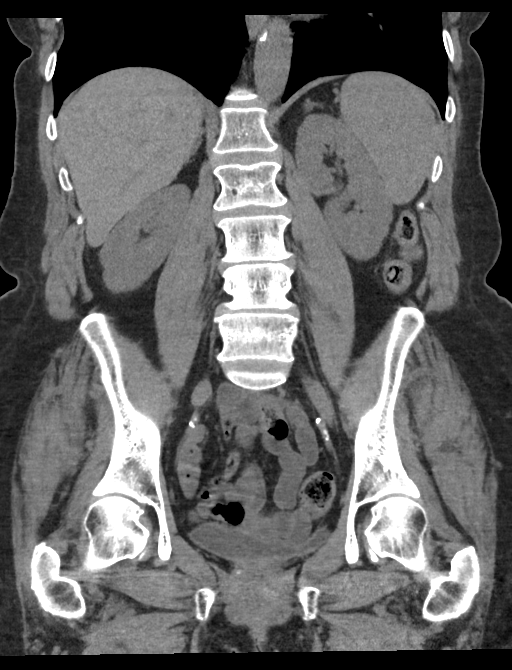

[16 of 46 positions shown; findings below may reference images not displayed]

FINDINGS: Lower chest: No acute abnormality.  Emphysematous changes.

Hepatobiliary: Known left hepatic lobe hemangioma is not well seen
without intravenous contrast. Status post cholecystectomy. No
biliary dilatation.

Pancreas: Unremarkable. No pancreatic ductal dilatation or
surrounding inflammatory changes.

Spleen: Normal in size without focal abnormality.

Adrenals/Urinary Tract: Adrenal glands are unremarkable. Kidneys are
normal, without renal calculi, focal lesion, or hydronephrosis.
Bladder is unremarkable.

Stomach/Bowel: Small hiatal hernia. Stomach is otherwise within
normal limits. Appendix is normal. No evidence of bowel wall
thickening, distention, or surrounding inflammatory changes.
Moderate colonic stool burden.

Vascular/Lymphatic: Aortic atherosclerosis. No enlarged abdominal or
pelvic lymph nodes.

Reproductive: Status post hysterectomy. No adnexal masses.

Other: No free fluid or pneumoperitoneum.

Musculoskeletal: No acute or significant osseous findings. Stable
grade 1 anterolisthesis at L4-L5.
IMPRESSION: 1.  No acute intra-abdominal process.  No obstructive uropathy.
2.  Aortic atherosclerosis (NALFH-BC7.7).

## 2019-04-23 ENCOUNTER — Telehealth: Payer: Self-pay | Admitting: *Deleted

## 2019-04-23 ENCOUNTER — Other Ambulatory Visit: Payer: Self-pay | Admitting: Student

## 2019-04-23 DIAGNOSIS — Z981 Arthrodesis status: Secondary | ICD-10-CM

## 2019-04-23 DIAGNOSIS — M542 Cervicalgia: Secondary | ICD-10-CM

## 2019-04-25 ENCOUNTER — Telehealth: Payer: Self-pay | Admitting: *Deleted

## 2019-05-14 ENCOUNTER — Ambulatory Visit: Payer: Medicaid Other

## 2019-07-16 ENCOUNTER — Ambulatory Visit: Payer: Medicaid Other | Admitting: Student in an Organized Health Care Education/Training Program

## 2019-09-02 NOTE — Progress Notes (Deleted)
Patient: Katelyn [name]  Service Category: E/M  Provider: Gillis Santa, MD  DOB: [birth year]  DOS: 09/03/2019  Location: Office  MRN: [id number]  Setting: Ambulatory outpatient  Referring Provider: Marin Olp, PA-C  Type: New Patient  Specialty: Interventional Pain Management  PCP: Lorelee Market, MD  Location: Remote location  Delivery: TeleHealth     Virtual Encounter - Pain Management PROVIDER NOTE: Information contained herein reflects review and annotations entered in association with encounter. Interpretation of such information and data should be left to medically-trained personnel. Information provided to patient can be located elsewhere in the medical record under "Patient Instructions". Document created using STT-dictation technology, any transcriptional errors that may result from process are unintentional.    Contact & Pharmacy Preferred: [phone] Home: [phone] (home) Mobile: [phone] (mobile) E-mail: [email or web address]  [id number] [id number]: [phone]: [phone] [id number] [address]: [phone]: [phone] Pre-screening note:  Our staff contacted Katelyn [name] and offered her an "in person", "face-to-face" appointment versus a telephone encounter. She indicated preferring the telephone encounter, at this time.  Primary Reason(s) for Visit: Tele-Encounter for initial evaluation of one or more chronic problems (new to examiner) potentially causing chronic pain, and posing a threat to normal musculoskeletal function. (Level of risk: High) CC: No chief complaint on file.  I contacted Katelyn Krabben 09/03/2019 via  *** .      I clearly identified myself as BiGillis SantaMD. I verified that I was speaking with the correct person using two  identifiers (Name: Katelyn WANEKand date of birth: 10/1960-08-10  Advanced Informed Consent I sought verbal advanced consent from Katelyn Krabbeor virtual visit interactions. I informed Katelyn [name] of possible security and privacy concerns, risks, and limitations associated with providing "not-in-person" medical evaluation and management services. I also informed Katelyn [name] of the availability of "in-person" appointments. Finally, I informed her that there would be a charge for the virtual visit and that she could be  personally, fully or partially, financially responsible for it. Ms. HaDirenzoxpressed understanding and agreed to proceed.   HPI  Katelyn [name] a [age].o. year old, female patient, contacted today for an initial evaluation of her chronic pain. She has Chest pain; HTN (hypertension); HLD (hyperlipidemia); and Urinary retention on their problem list.   Onset and Duration: {Hx; Onset and Duration:210120511} Cause of pain: {Hx; Cause:210120521} Severity: {Pain Severity:210120502} Timing: {Symptoms; Timing:210120501} Aggravating Factors: {Causes; Aggravating pain factors:210120507} Alleviating Factors: {Causes; Alleviating Factors:210120500} Associated Problems: {Hx; Associated problems:210120515} Quality of Pain: {Hx; Symptom quality or Descriptor:210120531} Previous Examinations or Tests: {Hx; Previous examinations or test:210120529} Previous Treatments: {Hx; Previous Treatment:210120503}  ***  Historic Controlled Substance Pharmacotherapy Review  Current opioid analgesics:  ***  Highest recorded MME/day: *** mg/day MME/day: *** mg/day   Historical Monitoring: The patient  reports no history of drug use. List of all UDS Test(s): Lab Results  Component Value Date   COCAINSCRNUR NONE DETECTED 11/13/2007   THCU NONE DETECTED 11/13/2007   ETH  11/13/2007    <5        LOWEST DETECTABLE LIMIT FOR SERUM ALCOHOL IS 11 mg/dL FOR MEDICAL PURPOSES ONLY   List of  other Serum/Urine Drug Screening Test(s):  Lab Results  Component Value Date   COCAINSCRNUR NONE DETECTED 11/13/2007   THCU  NONE DETECTED 11/13/2007   ETH  11/13/2007    <5        LOWEST DETECTABLE LIMIT FOR SERUM ALCOHOL IS 11 mg/dL FOR MEDICAL PURPOSES ONLY   Historical Background Evaluation: [place] PMP: PDMP not reviewed this encounter. Two (2) year initial data search conducted.             PMP NARX Score Report:  Narcotic: *** Sedative: *** Stimulant: *** Risk Assessment Profile: PMP NARX Overdose Risk Score: ***  Pharmacologic Plan: As per protocol, I have not taken over any controlled substance management, pending the results of ordered tests and/or consults.            Initial impression: Pending review of available data and ordered tests.  Meds   Current Outpatient Medications:  .  amLODipine (NORVASC) 5 MG tablet, Take 1 tablet (5 mg total) by mouth daily., Disp: 30 tablet, Rfl: 1 .  aspirin EC 81 MG tablet, Take 1 tablet (81 mg total) by mouth daily., Disp: , Rfl:  .  atorvastatin (LIPITOR) 40 MG tablet, Take 40 mg by mouth daily., Disp: , Rfl: 5 .  benazepril-hydrochlorthiazide (LOTENSIN HCT) 20-25 MG per tablet, Take 1 tablet by mouth daily. (Patient taking differently: Take 0.5 tablets by mouth daily. ), Disp: 30 tablet, Rfl: 0 .  cephALEXin (KEFLEX) 500 MG capsule, Take 1 capsule (500 mg total) by mouth 2 (two) times daily., Disp: 14 capsule, Rfl: 0 .  Cholecalciferol (VITAMIN D3) 5000 UNITS CAPS, Take 1 capsule by mouth See admin instructions. Take on Monday, Wednesday, and Friday, Disp: , Rfl:  .  cyclobenzaprine (FLEXERIL) 5 MG tablet, Take 1 tablet (5 mg total) by mouth 3 (three) times daily as needed for muscle spasms., Disp: 30 tablet, Rfl: 0 .  dicyclomine (BENTYL) 20 MG tablet, Take 1 tablet (20 mg total) by mouth 3 (three) times daily as needed for spasms., Disp: 30 tablet, Rfl: 0 .  esomeprazole (NEXIUM) 40 MG capsule, Take 40 mg by mouth daily at 12 noon.,  Disp: , Rfl:  .  HYDROcodone-acetaminophen (NORCO/VICODIN) 5-325 MG tablet, Take 1-2 tablets by mouth every 6 (six) hours as needed for moderate pain., Disp: 15 tablet, Rfl: 0 .  phenazopyridine (PYRIDIUM) 100 MG tablet, Take 1 tablet (100 mg total) by mouth 3 (three) times daily as needed for up to 6 doses for pain., Disp: 6 tablet, Rfl: 0 .  predniSONE (DELTASONE) 10 MG tablet, Take 6 tabs (60 mg) PO x 3 days, then take 4 tabs (40 mg) PO x 3 days, then take 2 tabs (20 mg) PO x 3 days, then take 1 tab (10 mg) PO x 3 days, then take 1/2 tab (5 mg) PO x 4 days., Disp: 41 tablet, Rfl: 0 .  promethazine (PHENERGAN) 25 MG tablet, Take 25 mg by mouth every 6 (six) hours as needed for nausea or vomiting., Disp: , Rfl:  .  SUMAtriptan (IMITREX) 100 MG tablet, Take 1 tablet (100 mg total) by mouth every 2 (two) hours as needed for migraine. May repeat in 2 hours if headache persists or recurs., Disp: 10 tablet, Rfl: 0 .  traZODone (DESYREL) 150 MG tablet, Take 300 mg by mouth at bedtime. , Disp: , Rfl: 5  ROS  Cardiovascular: {Hx; Cardiovascular History:210120525} Pulmonary or Respiratory: {Hx; Pumonary and/or Respiratory History:210120523} Neurological: {Hx; Neurological:210120504} Psychological-Psychiatric: {Hx; Psychological-Psychiatric History:210120512} Gastrointestinal: {Hx; Gastrointestinal:210120527} Genitourinary: {Hx; Genitourinary:210120506} Hematological: {Hx; Hematological:210120510} Endocrine: {Hx; Endocrine history:210120509} Rheumatologic: {Hx; Rheumatological:210120530} Musculoskeletal: {Hx; Musculoskeletal:210120528} Work History: {Hx; Work history:210120514}  Allergies  Katelyn [name] is allergic to ciprofloxacin; macrobid [nitrofurantoin monohyd macro]; and sulfa antibiotics.  Laboratory Chemistry Profile   Renal Lab Results  Component Value Date   BUN 8 11/16/2017   CREATININE 0.60 11/16/2017   GFRAA >60 11/16/2017   GFRNONAA >60 11/16/2017   SPECGRAV 1.015 11/16/2017    PHUR 7.0 11/16/2017   PROTEINUR NEGATIVE 01/14/2018    Electrolytes Lab Results  Component Value Date   NA 129 (L) 11/16/2017   K 3.0 (L) 11/16/2017   CL 95 (L) 11/16/2017   CALCIUM 8.5 (L) 11/16/2017    Hepatic Lab Results  Component Value Date   AST 36 06/29/2017   ALT 46 06/29/2017   ALBUMIN 3.9 06/29/2017   ALKPHOS 56 06/29/2017   LIPASE 29 06/29/2017    ID No results found for: LYMEIGGIGMAB, HIV, [place], STAPHAUREUS, MRSAPCR, HCVAB, PREGTESTUR, RMSFIGG, QFVRPH1IGG, QFVRPH2IGG, LYMEIGGIGMAB  Bone No results found for: [place], [id number], JJ9417EY8, XK4818HU3, 25OHVITD1, 25OHVITD2, 25OHVITD3, TESTOFREE, TESTOSTERONE  Endocrine Lab Results  Component Value Date   GLUCOSE 104 (H) 11/16/2017   GLUCOSEU NEGATIVE 01/14/2018   HGBA1C  11/14/2007    5.4 (NOTE)   The ADA recommends the following therapeutic goals for glycemic   control related to Hgb A1C measurement:   Goal of Therapy:   < 7.0% Hgb A1C   Action Suggested:  > 8.0% Hgb A1C   Ref:  Diabetes Care, 22, Suppl. 1, 1999   TSH 0.569 ***Test methodology is 3rd generation TSH*** 11/13/2007   FREET4 1.60 11/13/2007    Neuropathy Lab Results  Component Value Date   HGBA1C  11/14/2007    5.4 (NOTE)   The ADA recommends the following therapeutic goals for glycemic   control related to Hgb A1C measurement:   Goal of Therapy:   < 7.0% Hgb A1C   Action Suggested:  > 8.0% Hgb A1C   Ref:  Diabetes Care, 22, Suppl. 1, 1999    CNS No results found for: COLORCSF, APPEARCSF, RBCCOUNTCSF, WBCCSF, POLYSCSF, LYMPHSCSF, EOSCSF, PROTEINCSF, GLUCCSF, JCVIRUS, CSFOLI, IGGCSF, LABACHR, ACETBL, LABACHR, ACETBL  Inflammation (CRP: Acute  ESR: Chronic) No results found for: CRP, ESRSEDRATE, LATICACIDVEN  Rheumatology No results found for: RF, ANA, LABURIC, URICUR, LYMEIGGIGMAB, LYMEABIGMQN, HLAB27  Coagulation Lab Results  Component Value Date   INR 0.9 11/13/2007   LABPROT 12.3 11/13/2007   PLT 233 11/16/2017    Cardiovascular Lab  Results  Component Value Date   TROPONINI <0.30 01/27/2014   HGB 12.7 11/16/2017   HCT 36.9 11/16/2017    Screening No results found for: SARSCOV2NAA, COVIDSOURCE, STAPHAUREUS, MRSAPCR, HCVAB, HIV, PREGTESTUR  Cancer No results found for: CEA, CA125, LABCA2  Allergens No results found for: ALMOND, APPLE, ASPARAGUS, AVOCADO, BANANA, BARLEY, BASIL, BAYLEAF, GREENBEAN, LIMABEAN, WHITEBEAN, BEEFIGE, REDBEET, BLUEBERRY, BROCCOLI, CABBAGE, MELON, CARROT, CASEIN, CASHEWNUT, CAULIFLOWER, CELERY    Note: Lab results reviewed.   Imaging Review  Cervical Imaging: Cervical MR wo contrast:  Results for orders placed in visit on 01/28/03  MR Cervical Spine Wo Contrast   Narrative FINDINGS CLINICAL DATA:  HEADACHES, NECK PAIN. MRI CERVICAL SPINE WITHOUT CONTRAST COMPARISON:   MRI FROM [facility], 05/20/2000. THERE HAS BEEN ANTERIOR PLATE FUSION OF C5, C6 AND C7.  THE ALIGNMENT IS NORMAL.  THERE HAS BEEN A POSTERIOR HARDWARE FUSION OF C2, C3, C4, C5 AND C6.  THE CORD HAS NORMAL SIGNAL. THERE IS NO SIGNIFICANT SPINAL STENOSIS OR CORD DEFORMITY.  THERE IS MILD DISK BULGING AT C3-4 AND C4-5.  MILD SPURRING IS NOTED AT  C4-5.  THERE IS A SMALL LEFT PARACENTRAL DISK HERNIATION AT T1-2 AND ON THE LEFT ALSO AT T2-3. IMPRESSION 1.  ANTERIOR CERVICAL FUSION OF C5, C6, AND C7.  POSTERIOR FUSION OF C2 THROUGH C6.  THERE IS NORMAL ALIGNMENT AND NO SIGNIFICANT SPINAL STENOSIS. 2.  MILD DISK DEGENERATION AT C3-4 AND C4-5.  SMALL LEFT PARACENTRAL DISK HERNIATIONS AT T1-2 AND T2-3.    Results for orders placed during the hospital encounter of 05/06/05  CT Cervical Spine W Contrast   Narrative Clinical Data:   59 year old female; status post multiple cervical spine surgeries.  Pain localizing to neck and extending to both shoulders. CERVICAL SPINE MYELOGRAM - 05/06/05:  Technique:  Injection was performed by Dr. Vertell Limber and will be dictated under a separate cover.  After injection of 10 ml of Omnipaque 300, I  moved the patient into a Trendelenburg position and transferred contrast to the cervical spine. Findings:  Initial images of the cervical spine demonstrate anterior cervical diskectomy and fusion from C5 to C7.  There is posterior lateral mass and screw fixation at C2-3 bilaterally and then from C4 to T1 bilaterally.  Initial myelographic images demonstrate No focal filling defects or mass lesions.  Flexion and extension images were performed with the patient in the decubitus position using the standard and swimmer's techniques.  The patient has a very limited range of motion.  No abnormal motion is seen during flexion or extension. IMPRESSION:  1.   Status post anterior cervical diskectomy and fusion from C5 to C7.   2.   Posterior laminectomy from C4 through C6 with fusion at C2-3 and then separately from C4 to T1 posteriorly. 3.   Extremely limited range of motion without any abnormal motion during flexion and extension.   CT CERVICAL SPINE WITH CONTRAST (POST-MYELOGRAM) - 05/06/05:  Technique:  After the subarachnoid injection of contrast via lumbar approach, the patient was placed in the Trendelenburg position as stated above.  Following conventional myelographic imaging, the patient was taken to CT.  Axial images were reconstructed from the helical data set at 2.5 mm interval from the skull base through T2-3.  Coronal and sagittal reformats were provided. Findings:  As stated above, the patient is status post anterior cervical diskectomy and fusion from C5 to C7.  There has been partial laminectomy from C4 through C6 with separate fusions at C2-3 and then again at C4-T1. Individual levels are as follows: C2-3:      Negative. C3-4:      Negative. C4-5:      Partial laminectomy performed.  Mild uncovertebral spurring on the right.  No definite stenosis is seen.  There is slight disk bulge with some effacement of the anterior thecal space but no significant stenosis.   C5-6:      Postoperative  changes without significant stenosis. C6-7:      Postoperative changes without significant stenosis. C7-T1:    Postoperative changes.  No significant stenosis. Lung apices are clear.  Mild centrilobular emphysematous changes are noted.   IMPRESSION:  1.   Postoperative changes with anterior and posterior fusion as stated above. 2.   No focal stenosis identified at any level.  Provider: Bonna Gains   Cervical CT outside: No results found for this or any previous visit. Cervical DG 1 view:  Results for orders placed in visit on 01/20/03  DG Cervical Spine 1 View   Narrative FINDINGS CLINICAL DATA:  UPPER CERVICAL BLOCK. C-ARM FLUOROSCOPY 1-60 MINUTES C-ARM FLUOROSCOPY WAS UTILIZED  BY DR. Billie Ruddy. CERVICAL SPINE, ONE VIEW AP VIEW OF THE CERVICAL SPINE REVEALS TWO NEEDLES TO OVERLYING AREA OF C7-T1. IMPRESSION NEEDLES OVERLYING C7-T1.   Cervical DG 2-3 views:  Results for orders placed in visit on 02/20/01  DG Cervical Spine 2-3 Views   Narrative FINDINGS CLINICAL DATA:  HEADACHES/CERVICAL BLOCK. C-ARM FLUOROSCOPY: C-ARM WAS USED IN THE OPERATING ROOM FOR CERVICAL BLOCK. IMPRESSION C-ARM USED INTRAOPERATIVELY. CERVICAL SPINE SPOT FILMS: TWO ELECTRONIC SPOT IMAGES WERE OBTAINED OVER THE CERVICAL SPINE.  ON THE SECOND IMAGE, THERE ARE THREE NEEDLES OVERLYING THE LATERAL MASS/FACET REGION ON ONE SIDE.  THERE IS NO LABELING AS TO WHETHER THIS IS THE PATIENT'S LEFT OR RIGHT.  ON THE REQUISITION IT STATES THAT THE CERVICAL BLOCK WAS DONE ON THE LEFT. IMPRESSION THREE LEVEL CERVICAL BLOCK - SIDE CANNOT BE DETERMINED SINCE THE FILM IS NOT LABELED AS TO LEFT AND RIGHT.  CLINICALLY THIS WAS ON THE LEFT SIDE BASED ON THE INFORMATION ON THE REQUISITION.    Results for orders placed in visit on 10/17/00  DG Cervical Spine With Flex & Extend   Narrative FINDINGS CLINICAL DATA:  STATUS POST FUSION C5-7. FIVE VIEWS OF THE CERVICAL SPINE WITH FLEXION AND EXTENSION VIEWS 10/17/00 COMPARISON  STUDY 09/04/00. THE PATIENT IS STATUS POST ANTERIOR CERVICAL FUSION.  PLATE AND SCREWS ARE NOTED IN PLACE. VERTEBRAL BODY HEIGHT AND ALIGNMENT IS STABLE.  THERE IS NO EVIDENCE OF ACUTE FRACTURE OR DISLOCATION.  NEURAL FORAMINA ARE WIDELY PATENT ON THE OBLIQUE VIEWS.  WITH FLEXION AND EXTENSION THERE IS NO EVIDENCE OF INSTABILITY.  HOWEVER IT IS NOTED THAT THERE WAS LIMITED FLEXION AND EXTENSION MOVEMENT. IMPRESSION 1.  STATUS POST ANTERIOR PLATE-SCREW FUSION OF C5 THROUGH C7.  NO INSTABILITY WITH FLEXION AND EXTENSION.   Cervical DG complete:  Results for orders placed in visit on 03/13/00  DG Cervical Spine Complete   Narrative FINDINGS CLINICAL:  NECK PAIN. CERVICAL SPINE AP AND LATERAL VIEWS IN NEUTRAL AND F/E: A FLEXION SWIMMER'S PROJECTION WAS ALSO PERFORMED.  BONES ARE WELL ALIGNED AND STABLE WITH FLEXION/EXTENSION.  THE C5-6 AND C6-7 INTERSPACES APPEAR SLIGHTLY NARROWED WITH MINIMAL SPURRING. THE SOFT TISSUES ARE UNREMARKABLE. IMPRESSION 1.  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 2.  BONES ARE WELL ALIGNED AND STABLE WITH FLEXION/EXTENSION.   Cervical DG Myelogram views:  Results for orders placed during the hospital encounter of 05/06/05  DG Myelogram Cervical   Narrative Clinical Data:   59 year old female; status post multiple cervical spine surgeries.  Pain localizing to neck and extending to both shoulders. CERVICAL SPINE MYELOGRAM - 05/06/05:  Technique:  Injection was performed by Dr. Vertell Limber and will be dictated under a separate cover.  After injection of 10 ml of Omnipaque 300, I moved the patient into a Trendelenburg position and transferred contrast to the cervical spine. Findings:  Initial images of the cervical spine demonstrate anterior cervical diskectomy and fusion from C5 to C7.  There is posterior lateral mass and screw fixation at C2-3 bilaterally and then from C4 to T1 bilaterally.  Initial myelographic images demonstrate No focal filling defects or  mass lesions.  Flexion and extension images were performed with the patient in the decubitus position using the standard and swimmer's techniques.  The patient has a very limited range of motion.  No abnormal motion is seen during flexion or extension. IMPRESSION:  1.   Status post anterior cervical diskectomy and fusion from C5 to C7.   2.   Posterior laminectomy from C4  through C6 with fusion at C2-3 and then separately from C4 to T1 posteriorly. 3.   Extremely limited range of motion without any abnormal motion during flexion and extension.   CT CERVICAL SPINE WITH CONTRAST (POST-MYELOGRAM) - 05/06/05:  Technique:  After the subarachnoid injection of contrast via lumbar approach, the patient was placed in the Trendelenburg position as stated above.  Following conventional myelographic imaging, the patient was taken to CT.  Axial images were reconstructed from the helical data set at 2.5 mm interval from the skull base through T2-3.  Coronal and sagittal reformats were provided. Findings:  As stated above, the patient is status post anterior cervical diskectomy and fusion from C5 to C7.  There has been partial laminectomy from C4 through C6 with separate fusions at C2-3 and then again at C4-T1. Individual levels are as follows: C2-3:      Negative. C3-4:      Negative. C4-5:      Partial laminectomy performed.  Mild uncovertebral spurring on the right.  No definite stenosis is seen.  There is slight disk bulge with some effacement of the anterior thecal space but no significant stenosis.   C5-6:      Postoperative changes without significant stenosis. C6-7:      Postoperative changes without significant stenosis. C7-T1:    Postoperative changes.  No significant stenosis. Lung apices are clear.  Mild centrilobular emphysematous changes are noted.   IMPRESSION:  1.   Postoperative changes with anterior and posterior fusion as stated above. 2.   No focal stenosis identified at any  level.  Provider: Coralee Pesa, KT Graeub     Lumbar DG Epidurogram IP:  Results for orders placed in visit on 07/10/00  IR Epidurography   Narrative FINDINGS CLINICAL DATA:     NECK PAIN. THE PATIENT FELT HER NECK PAIN AND RIGHT ARM PAIN WAS SIGNIFICANTLY IMPROVED AFTER THE FIRST PROCEDURE.  I ELECTED TO REPEAT IT. C7-T1 INTERLAMINAR EPIDURAL: FOLLOWING INFORMED CONSENT, STERILE PREPARATION OF THE BACK, AND ADEQUATE LOCAL ANESTHESIA, A 20 GAUGE CRAWFORD NEEDLE WAS PLACED IN THE EPIDURAL SPACE AT C7-T1 JUST TO THE RIGHT OF MIDLINE. CONTRAST INJECTION SHOWED GOOD EPIDURAL SPREAD ABOVE AND BELOW.  I INJECTED 3 CC (120 MG) OF KENALOG FOLLOWED BY 1 CC OF 1% LIDOCAINE.  POST-PROCEDURE, THE PATIENT WAS COMFORTABLE. IMPRESSION TECHNICALLY SUCCESSFUL NONSELECTIVE EPIDURAL NUMBER TWO, AT C7-T1, MIDLINE AND SLIGHTLY TO THE RIGHT.    Complexity Note: Imaging results reviewed. Results shared with Katelyn [name], using Layman's terms.                         [place]  Drug: Katelyn [name]  reports no history of drug use. Alcohol:  reports no history of alcohol use. Tobacco:  reports that she has been smoking cigarettes. She has been smoking about 0.50 packs per day. She has never used smokeless tobacco. Medical:  has a past medical history of COPD (chronic obstructive pulmonary disease) ([place]), GERD (gastroesophageal reflux disease), Hiatal hernia, HLD (hyperlipidemia), Hypertension, and Smoking. Family: family history includes CAD in her father.  Past Surgical History:  Procedure Laterality Date  . ABDOMINAL HYSTERECTOMY    . ADENOIDECTOMY    . CHOLECYSTECTOMY    . HERNIA REPAIR    . NECK SURGERY    . SHOULDER SURGERY    . TONSILLECTOMY     Active Ambulatory Problems    Diagnosis Date Noted  . Chest pain 01/27/2014  . HTN (hypertension) 01/27/2014  .  HLD (hyperlipidemia) 01/27/2014  . Urinary retention 07/17/2017   Resolved Ambulatory Problems    Diagnosis Date Noted  . No Resolved  Ambulatory Problems   Past Medical History:  Diagnosis Date  . COPD (chronic obstructive pulmonary disease) ([place])   . GERD (gastroesophageal reflux disease)   . Hiatal hernia   . Hypertension   . Smoking    Assessment  Primary Diagnosis & Pertinent Problem List: There were no encounter diagnoses.  Visit Diagnosis (New problems to examiner): No diagnosis found. Plan of Care (Initial workup plan)  Note: Katelyn [name] was reminded that as per protocol, today's visit has been an evaluation only. We have not taken over the patient's controlled substance management.  Problem-specific plan: No problem-specific Assessment & Plan notes found for this encounter.  Lab Orders  No laboratory test(s) ordered today   Imaging Orders  No imaging studies ordered today   Referral Orders  No referral(s) requested today   Procedure Orders    No procedure(s) ordered today   Pharmacotherapy (current): Medications ordered:  No orders of the defined types were placed in this encounter.    Pharmacological management options:  Opioid Analgesics: The patient was informed that there is no guarantee that she would be a candidate for opioid analgesics. The decision will be made following CDC guidelines. This decision will be based on the results of diagnostic studies, as well as Katelyn [name]'s risk profile.   Membrane stabilizer: To be determined at a later time  Muscle relaxant: To be determined at a later time  NSAID: To be determined at a later time  Other analgesic(s): To be determined at a later time   Interventional management options: Katelyn [name] was informed that there is no guarantee that she would be a candidate for interventional therapies. The decision will be based on the results of diagnostic studies, as well as Katelyn [name]'s risk profile.  Procedure(s) under consideration:  ***   Provider-requested follow-up: No follow-ups on file.  Future Appointments  Date Time Provider  [place]  09/03/2019 11:00 AM Gillis Santa, MD [facility] None   Total duration of encounter: 45 minutes.  Primary Care Physician: Lorelee Market, MD Note by: Gillis Santa, MD Date: 09/03/2019; Time: 9:02 AM

## 2019-09-03 ENCOUNTER — Ambulatory Visit: Payer: Medicaid Other | Admitting: Student in an Organized Health Care Education/Training Program

## 2019-12-26 ENCOUNTER — Ambulatory Visit (LOCAL_COMMUNITY_HEALTH_CENTER): Payer: Medicaid Other

## 2019-12-26 ENCOUNTER — Other Ambulatory Visit: Payer: Self-pay

## 2019-12-26 DIAGNOSIS — Z23 Encounter for immunization: Secondary | ICD-10-CM

## 2019-12-26 NOTE — Progress Notes (Signed)
Here for Tdap. Per pt, no prior hx of Tdap as adult. No vaccines listed in [facility] or Epic.  Reports being deeply scratched on Right arm  by her pet dog this week. Tdap given without problem. [facility] copy given. Enc pt to see PCP for evaluation of injury.  Jerel Shepherd, RN'

## 2020-01-31 ENCOUNTER — Ambulatory Visit
Admission: EM | Admit: 2020-01-31 | Discharge: 2020-01-31 | Disposition: A | Discharge status: Home / Self Care | Payer: Medicaid Other | Attending: Emergency Medicine | Admitting: Emergency Medicine

## 2020-01-31 DIAGNOSIS — K120 Recurrent oral aphthae: Secondary | ICD-10-CM

## 2020-01-31 MED ORDER — MAGIC MOUTHWASH: 5.0000 mL | Freq: Three times a day (TID) | ORAL | 0 refills | Status: DC

## 2020-01-31 NOTE — Discharge Instructions (Signed)
Use the Magic Mouthwash as directed.  Follow up with your primary care provider if your symptoms are not improving.    

## 2020-01-31 NOTE — ED Triage Notes (Addendum)
Patient complains of 10/10 dental pain x1 week. Reports she has tried calling her dentist this week, but has not heard back from him. States pain is now radiating into the right ear.

## 2020-01-31 NOTE — ED Provider Notes (Signed)
Katelyn [name]    CSN: [id number] Arrival date & time: 01/31/20  1758      History   Chief Complaint Chief Complaint  Patient presents with   Dental Pain   Otalgia    HPI Katelyn [name] is a 59 y.o. female.   Patient presents with pain on the right side of her tongue x1 week.  She states she keeps accidentally biting the area.  She also reports right ear pain.  She denies drainage, bleeding, fever, chills, toothache, sore throat, cough, shortness of breath, abdominal pain, or other symptoms.  Treatment attempted at home with salt water gargles.  The history is provided by the patient.    Past Medical History:  Diagnosis Date   COPD (chronic obstructive pulmonary disease) (HCC)    GERD (gastroesophageal reflux disease)    Hiatal hernia    HLD (hyperlipidemia)    Hypertension    Smoking     Patient Active Problem List   Diagnosis Date Noted   Urinary retention 07/17/2017   Chest pain 01/27/2014   HTN (hypertension) 01/27/2014   HLD (hyperlipidemia) 01/27/2014    Past Surgical History:  Procedure Laterality Date   ABDOMINAL HYSTERECTOMY     ADENOIDECTOMY     CHOLECYSTECTOMY     HERNIA REPAIR     NECK SURGERY     SHOULDER SURGERY     TONSILLECTOMY      OB History   No obstetric history on file.      Home Medications    Prior to Admission medications   Medication Sig Start Date End Date Taking? Authorizing Provider  amLODipine (NORVASC) 5 MG tablet Take 1 tablet (5 mg total) by mouth daily. 01/27/14   Esperanza Sheets, MD  aspirin EC 81 MG tablet Take 1 tablet (81 mg total) by mouth daily. 01/27/14   Esperanza Sheets, MD  atorvastatin (LIPITOR) 40 MG tablet Take 40 mg by mouth daily. 01/01/15   [provider]  benazepril-hydrochlorthiazide (LOTENSIN HCT) 20-25 MG per tablet Take 1 tablet by mouth daily. Patient taking differently: Take 0.5 tablets by mouth daily.  01/27/14   Esperanza Sheets, MD  cephALEXin (KEFLEX)  500 MG capsule Take 1 capsule (500 mg total) by mouth 2 (two) times daily. 01/14/18   Loleta Rose, MD  Cholecalciferol (VITAMIN D3) 5000 UNITS CAPS Take 1 capsule by mouth See admin instructions. Take on Monday, Wednesday, and Friday    [provider]  cyclobenzaprine (FLEXERIL) 5 MG tablet Take 1 tablet (5 mg total) by mouth 3 (three) times daily as needed for muscle spasms. 01/14/18   Loleta Rose, MD  dicyclomine (BENTYL) 20 MG tablet Take 1 tablet (20 mg total) by mouth 3 (three) times daily as needed for spasms. 06/08/17   Sharman Cheek, MD  esomeprazole (NEXIUM) 40 MG capsule Take 40 mg by mouth daily at 12 noon.    [provider]  HYDROcodone-acetaminophen (NORCO/VICODIN) 5-325 MG tablet Take 1-2 tablets by mouth every 6 (six) hours as needed for moderate pain. 01/14/18   Loleta Rose, MD  magic mouthwash SOLN Take 5 mLs by mouth 3 (three) times daily. 01/31/20   Mickie Bail, NP  phenazopyridine (PYRIDIUM) 100 MG tablet Take 1 tablet (100 mg total) by mouth 3 (three) times daily as needed for up to 6 doses for pain. 11/16/17   Willy Eddy, MD  predniSONE (DELTASONE) 10 MG tablet Take 6 tabs (60 mg) PO x 3 days, then take 4 tabs (40  mg) PO x 3 days, then take 2 tabs (20 mg) PO x 3 days, then take 1 tab (10 mg) PO x 3 days, then take 1/2 tab (5 mg) PO x 4 days. 01/14/18   Loleta Rose, MD  promethazine (PHENERGAN) 25 MG tablet Take 25 mg by mouth every 6 (six) hours as needed for nausea or vomiting.    [provider]  SUMAtriptan (IMITREX) 100 MG tablet Take 1 tablet (100 mg total) by mouth every 2 (two) hours as needed for migraine. May repeat in 2 hours if headache persists or recurs. 01/27/14   Esperanza Sheets, MD  traZODone (DESYREL) 150 MG tablet Take 300 mg by mouth at bedtime.  12/07/14   [provider]    Family History Family History  Problem Relation Age of Onset   CAD Father        onset age 79   Bladder Cancer Neg Hx    Kidney  cancer Neg Hx     Social History Social History   Tobacco Use   Smoking status: Current Every Day Smoker    Packs/day: 2.00    Types: Cigarettes   Smokeless tobacco: Never Used  Substance Use Topics   Alcohol use: No   Drug use: No     Allergies   Ciprofloxacin, Macrobid [nitrofurantoin monohyd macro], and Sulfa antibiotics   Review of Systems Review of Systems  Constitutional: Negative for chills and fever.  HENT: Positive for ear pain. Negative for sore throat.        Tongue pain.  Eyes: Negative for pain and visual disturbance.  Respiratory: Negative for cough and shortness of breath.   Cardiovascular: Negative for chest pain and palpitations.  Gastrointestinal: Negative for abdominal pain and vomiting.  Genitourinary: Negative for dysuria and hematuria.  Musculoskeletal: Negative for arthralgias and back pain.  Skin: Negative for color change and rash.  Neurological: Negative for seizures and syncope.  All other systems reviewed and are negative.    Physical Exam Triage Vital Signs ED Triage Vitals  Enc Vitals Group     BP 01/31/20 1801 137/86     Pulse Rate 01/31/20 1801 76     Resp 01/31/20 1801 20     Temp 01/31/20 1801 99.1 F (37.3 C)     Temp src --      SpO2 01/31/20 1801 97 %     Weight --      Height --      Head Circumference --      Peak Flow --      Pain Score 01/31/20 1759 10     Pain Loc --      Pain Edu? --      Excl. in GC? --    No data found.  Updated Vital Signs BP 137/86    Pulse 76    Temp 99.1 F (37.3 C)    Resp 20    SpO2 97%   Visual Acuity Right Eye Distance:   Left Eye Distance:   Bilateral Distance:    Right Eye Near:   Left Eye Near:    Bilateral Near:     Physical Exam Vitals and nursing note reviewed.  Constitutional:      General: She is not in acute distress.    Appearance: She is well-developed.  HENT:     Head: Normocephalic and atraumatic.     Right Ear: Tympanic membrane and ear canal normal.       Left Ear: Tympanic  membrane and ear canal normal.     Nose: Nose normal.     Mouth/Throat:     Mouth: Mucous membranes are moist.     Pharynx: Oropharynx is clear.     Comments: 3 mm ulcer on right side of tongue; no drainage or bleeding.  Eyes:     Conjunctiva/sclera: Conjunctivae normal.  Cardiovascular:     Rate and Rhythm: Normal rate and regular rhythm.     Heart sounds: No murmur heard.   Pulmonary:     Effort: Pulmonary effort is normal. No respiratory distress.     Breath sounds: Normal breath sounds.  Abdominal:     Palpations: Abdomen is soft.     Tenderness: There is no abdominal tenderness.  Musculoskeletal:     Cervical back: Neck supple.  Skin:    General: Skin is warm and dry.  Neurological:     General: No focal deficit present.     Mental Status: She is alert and oriented to person, place, and time.     Gait: Gait normal.  Psychiatric:        Mood and Affect: Mood normal.        Behavior: Behavior normal.      UC Treatments / Results  Labs (all labs ordered are listed, but only abnormal results are displayed) Labs Reviewed - No data to display  EKG   Radiology No results found.  Procedures Procedures (including critical care time)  Medications Ordered in UC Medications - No data to display  Initial Impression / Assessment and Plan / UC Course  I have reviewed the triage vital signs and the nursing notes.  Pertinent labs & imaging results that were available during my care of the patient were reviewed by me and considered in my medical decision making (see chart for details).   Aphthous ulcer of tongue.  Treating with Magic Mouthwash.  Instructed patient to follow-up with her primary care provider if her symptoms are not improving.  Patient agrees to plan of care.      Final Clinical Impressions(s) / UC Diagnoses   Final diagnoses:  Aphthous ulcer of tongue     Discharge Instructions     Use the Magic Mouthwash as directed.     Follow up with your primary care provider if your symptoms are not improving.       ED Prescriptions    Medication Sig Dispense Auth. Provider   magic mouthwash SOLN Take 5 mLs by mouth 3 (three) times daily. 75 mL Mickie Bail, NP     I have reviewed the PDMP during this encounter.   Mickie Bail, NP 01/31/20 1827

## 2020-02-04 ENCOUNTER — Other Ambulatory Visit: Payer: Self-pay

## 2020-02-04 ENCOUNTER — Emergency Department
Admission: EM | Admit: 2020-02-04 | Discharge: 2020-02-05 | Disposition: A | Discharge status: Home / Self Care | Payer: Medicaid Other | Attending: Emergency Medicine | Admitting: Emergency Medicine

## 2020-02-04 ENCOUNTER — Emergency Department: Payer: Medicaid Other

## 2020-02-04 DIAGNOSIS — I959 Hypotension, unspecified: Secondary | ICD-10-CM | POA: Diagnosis present

## 2020-02-04 DIAGNOSIS — E860 Dehydration: Secondary | ICD-10-CM

## 2020-02-04 DIAGNOSIS — J449 Chronic obstructive pulmonary disease, unspecified: Secondary | ICD-10-CM | POA: Diagnosis not present

## 2020-02-04 DIAGNOSIS — F17210 Nicotine dependence, cigarettes, uncomplicated: Secondary | ICD-10-CM | POA: Insufficient documentation

## 2020-02-04 DIAGNOSIS — I10 Essential (primary) hypertension: Secondary | ICD-10-CM | POA: Insufficient documentation

## 2020-02-04 DIAGNOSIS — Z7982 Long term (current) use of aspirin: Secondary | ICD-10-CM | POA: Insufficient documentation

## 2020-02-04 DIAGNOSIS — E876 Hypokalemia: Secondary | ICD-10-CM | POA: Diagnosis not present

## 2020-02-04 DIAGNOSIS — Z79899 Other long term (current) drug therapy: Secondary | ICD-10-CM | POA: Insufficient documentation

## 2020-02-04 MED ORDER — NALOXONE HCL 2 MG/2ML IJ SOSY
1.0000 mg | PREFILLED_SYRINGE | Freq: Once | INTRAMUSCULAR | Status: AC
  Administered 2020-02-04: 1 mg via INTRAVENOUS
  Filled 2020-02-04: qty 2

## 2020-02-04 NOTE — ED Triage Notes (Addendum)
BIB EMS pt from home, pt c/o 2wks of thrush, started abx earlier today. Pt states in the heat fishing today without any fluids. Pt A&O x 4. Pt received of LR from EMS, and 8mg  zofran.

## 2020-02-04 NOTE — ED Provider Notes (Signed)
[facility] Emergency Department Provider Note  ____________________________________________   First MD Initiated Contact with Patient 02/04/20 2311     (approximate)  I have reviewed the triage vital signs and the nursing notes.  Level 5 caveat history review of system limited secondary to patient's altered mental status HISTORY  Chief Complaint Hypotension   HPI Katelyn [name] is a 59 y.o. female with below list of previous medical conditions presents to the emergency department secondary to hypotension. Patient's significant other bedside stated that he noted her blood pressure to be low at home and that she was markedly sleepy. On my arrival to the room the patient is indeed somnolent but responds to verbal stimuli. Patient denies any complaints. Patient significant other states that she started a new medication recently but does not recall the name of it.  He does admit that she took something over-the-counter for sleep today as well.  Stating that she has not slept well in the past 4 days.  Patient was able to state that she had no discomfort at present.  No headache, no weakness numbness visual changes.        Past Medical History:  Diagnosis Date  . COPD (chronic obstructive pulmonary disease) (HCC)   . GERD (gastroesophageal reflux disease)   . Hiatal hernia   . HLD (hyperlipidemia)   . Hypertension   . Smoking     Patient Active Problem List   Diagnosis Date Noted  . Urinary retention 07/17/2017  . Chest pain 01/27/2014  . HTN (hypertension) 01/27/2014  . HLD (hyperlipidemia) 01/27/2014    Past Surgical History:  Procedure Laterality Date  . ABDOMINAL HYSTERECTOMY    . ADENOIDECTOMY    . CHOLECYSTECTOMY    . HERNIA REPAIR    . NECK SURGERY    . SHOULDER SURGERY    . TONSILLECTOMY      Prior to Admission medications   Medication Sig Start Date End Date Taking? Authorizing Provider  amLODipine (NORVASC) 5 MG tablet Take 1  tablet (5 mg total) by mouth daily. 01/27/14   Esperanza Sheets, MD  aspirin EC 81 MG tablet Take 1 tablet (81 mg total) by mouth daily. 01/27/14   Esperanza Sheets, MD  atorvastatin (LIPITOR) 40 MG tablet Take 40 mg by mouth daily. 01/01/15   [provider]  benazepril-hydrochlorthiazide (LOTENSIN HCT) 20-25 MG per tablet Take 1 tablet by mouth daily. Patient taking differently: Take 0.5 tablets by mouth daily.  01/27/14   Esperanza Sheets, MD  cephALEXin (KEFLEX) 500 MG capsule Take 1 capsule (500 mg total) by mouth 2 (two) times daily. 01/14/18   Loleta Rose, MD  Cholecalciferol (VITAMIN D3) 5000 UNITS CAPS Take 1 capsule by mouth See admin instructions. Take on Monday, Wednesday, and Friday    [provider]  cyclobenzaprine (FLEXERIL) 5 MG tablet Take 1 tablet (5 mg total) by mouth 3 (three) times daily as needed for muscle spasms. 01/14/18   Loleta Rose, MD  dicyclomine (BENTYL) 20 MG tablet Take 1 tablet (20 mg total) by mouth 3 (three) times daily as needed for spasms. 06/08/17   Sharman Cheek, MD  esomeprazole (NEXIUM) 40 MG capsule Take 40 mg by mouth daily at 12 noon.    [provider]  HYDROcodone-acetaminophen (NORCO/VICODIN) 5-325 MG tablet Take 1-2 tablets by mouth every 6 (six) hours as needed for moderate pain. 01/14/18   Loleta Rose, MD  magic mouthwash SOLN Take 5 mLs by mouth 3 (three) times  daily. 01/31/20   Mickie Bail, NP  phenazopyridine (PYRIDIUM) 100 MG tablet Take 1 tablet (100 mg total) by mouth 3 (three) times daily as needed for up to 6 doses for pain. 11/16/17   Willy Eddy, MD  predniSONE (DELTASONE) 10 MG tablet Take 6 tabs (60 mg) PO x 3 days, then take 4 tabs (40 mg) PO x 3 days, then take 2 tabs (20 mg) PO x 3 days, then take 1 tab (10 mg) PO x 3 days, then take 1/2 tab (5 mg) PO x 4 days. 01/14/18   Loleta Rose, MD  promethazine (PHENERGAN) 25 MG tablet Take 25 mg by mouth every 6 (six) hours as needed for nausea or  vomiting.    [provider]  SUMAtriptan (IMITREX) 100 MG tablet Take 1 tablet (100 mg total) by mouth every 2 (two) hours as needed for migraine. May repeat in 2 hours if headache persists or recurs. 01/27/14   Esperanza Sheets, MD  traZODone (DESYREL) 150 MG tablet Take 300 mg by mouth at bedtime.  12/07/14   [provider]    Allergies Ciprofloxacin, Macrobid [nitrofurantoin monohyd macro], and Sulfa antibiotics  Family History  Problem Relation Age of Onset  . CAD Father        onset age 90  . Bladder Cancer Neg Hx   . Kidney cancer Neg Hx     Social History Social History   Tobacco Use  . Smoking status: Current Every Day Smoker    Packs/day: 2.00    Types: Cigarettes  . Smokeless tobacco: Never Used  Substance Use Topics  . Alcohol use: No  . Drug use: No    Review of Systems Constitutional: No fever/chills Eyes: No visual changes. ENT: No sore throat. Cardiovascular: Denies chest pain. Respiratory: Denies shortness of breath. Gastrointestinal: No abdominal pain.  No nausea, no vomiting.  No diarrhea.  No constipation. Genitourinary: Negative for dysuria. Musculoskeletal: Negative for neck pain.  Negative for back pain. Integumentary: Negative for rash. Neurological: Negative for headaches, focal weakness or numbness.   ____________________________________________   PHYSICAL EXAM:  VITAL SIGNS: ED Triage Vitals  Enc Vitals Group     BP --      Pulse --      Resp --      Temp --      Temp src --      SpO2 02/04/20 2307 99 %     Weight --      Height --      Head Circumference --      Peak Flow --      Pain Score 02/04/20 2312 0     Pain Loc --      Pain Edu? --      Excl. in GC? --     Constitutional: Alert and oriented.  Somnolent but arousable to verbal stimuli Eyes: Conjunctivae are normal.  Head: Atraumatic. Mouth/Throat: Patient is wearing a mask. Neck: No stridor.  No meningeal signs.   Cardiovascular: Normal rate,  regular rhythm. Good peripheral circulation. Grossly normal heart sounds. Respiratory: Normal respiratory effort.  No retractions. Gastrointestinal: Soft and nontender. No distention.  Musculoskeletal: No lower extremity tenderness nor edema. No gross deformities of extremities. Neurologic:  Normal speech and language. No gross focal neurologic deficits are appreciated.  Skin:  Skin is warm, dry and intact. Psychiatric: Mood and affect are normal. Speech and behavior are normal.  ____________________________________________   LABS (all labs ordered are listed, but only  abnormal results are displayed)  Labs Reviewed  COMPREHENSIVE METABOLIC PANEL - Abnormal; Notable for the following components:      Result Value   Potassium 2.3 (*)    Glucose, Bld 109 (*)    Calcium 8.1 (*)    Total Protein 5.8 (*)    All other components within normal limits  CBC WITH DIFFERENTIAL/PLATELET - Abnormal; Notable for the following components:   RBC 3.72 (*)    Hemoglobin 11.9 (*)    All other components within normal limits  URINALYSIS, COMPLETE (UACMP) WITH MICROSCOPIC - Abnormal; Notable for the following components:   Color, Urine YELLOW (*)    APPearance CLOUDY (*)    Bilirubin Urine MODERATE (*)    Ketones, ur 5 (*)    Protein, ur 30 (*)    Leukocytes,Ua SMALL (*)    Bacteria, UA RARE (*)    All other components within normal limits  URINE DRUG SCREEN, QUALITATIVE ([facility] ONLY) - Abnormal; Notable for the following components:   Tricyclic, Ur Screen POSITIVE (*)    Cannabinoid 50 Ng, Ur [place] POSITIVE (*)    Barbiturates, Ur Screen POSITIVE (*)    All other components within normal limits  CULTURE, BLOOD (SINGLE)  URINE CULTURE  LACTIC ACID, PLASMA  PROTIME-INR  APTT  CK  MAGNESIUM  LACTIC ACID, PLASMA  POC URINE PREG, ED   ____________________________________________  EKG  ED ECG REPORT I, [place] Icelynn Onken, the attending physician, personally viewed and interpreted this ECG.    Date: 02/04/2020  EKG Time: 10:44 PM  Rate: 62  Rhythm: Normal sinus rhythm  Axis: Normal  Intervals: Normal  ST&T Change: None  ____________________________________________  RADIOLOGY I, [place] Aadya Kindler, personally viewed and evaluated these images (plain radiographs) as part of my medical decision making, as well as reviewing the written report by the radiologist.  ED MD interpretation: No acute intrathoracic process noted on chest x-ray per radiologist.  Official radiology report(s): DG Chest Port 1 View  Result Date: 02/04/2020 CLINICAL DATA:  Sepsis, thrush EXAM: PORTABLE CHEST 1 VIEW COMPARISON:  05/14/2017 FINDINGS: Single frontal view of the chest demonstrates an unremarkable cardiac silhouette. No airspace disease, effusion, or pneumothorax. Stable postsurgical changes cervical spine. IMPRESSION: 1. No acute intrathoracic process. Electronically Signed   By: Sharlet SalinaMichael  Alleigh Mollica M.D.   On: 02/04/2020 23:32    ____________________________________________     Procedures   ____________________________________________   INITIAL IMPRESSION / MDM / ASSESSMENT AND PLAN / ED COURSE  As part of my medical decision making, I reviewed the following data within the electronic MEDICAL RECORD NUMBER  59 year old female presented with above-stated history and physical exam secondary to altered mental status.  Differential diagnosis quite broad including but not limited to medication induced, hyponatremia, sepsis, dehydration possible rhabdomyolysis given reported history of being outside fishing today with not much fluid intake.  Also considered possibility of intracranial pathology however no focal neurological deficits noted on exam.  Patient given 1 mg of Narcan without much improvement initially of altered mental status.  Patient given 2 L IV normal saline as well given clinical findings of dehydration.  Laboratory data revealed a potassium of 2.3 which the patient did receive 40 mEq of p.o.  potassium as well as IV potassium 10 mEq x 3, lactic acid CK normal urinalysis contaminated specimen however ketones noted.  Urine drug screen did reveal barbiturates and marijuana.  Following receiving 2 L IV normal saline and Narcan and observation patient's mentation now appropriate.  Patient denies  any complaints stating that she "feels so much better".      ____________________________________________  FINAL CLINICAL IMPRESSION(S) / ED DIAGNOSES  Final diagnoses:  Hypokalemia  Hypotension, unspecified hypotension type  Dehydration     MEDICATIONS GIVEN DURING THIS VISIT:  Medications  potassium chloride 10 mEq in 100 mL IVPB (0 mEq Intravenous Stopped 02/05/20 0259)  naloxone (NARCAN) injection 1 mg (1 mg Intravenous Given 02/04/20 2328)  potassium chloride SA (KLOR-CON) CR tablet 40 mEq (40 mEq Oral Given 02/05/20 0048)  sodium chloride 0.9 % bolus 1,000 mL (1,000 mLs Intravenous New Bag/Given 02/05/20 0056)     ED Discharge Orders    None      *Please note:  Katelyn [name] was evaluated in Emergency Department on 02/05/2020 for the symptoms described in the history of present illness. She was evaluated in the context of the global COVID-19 pandemic, which necessitated consideration that the patient might be at risk for infection with the SARS-CoV-2 virus that causes COVID-19. Institutional protocols and algorithms that pertain to the evaluation of patients at risk for COVID-19 are in a state of rapid change based on information released by regulatory bodies including the CDC and federal and state organizations. These policies and algorithms were followed during the patient's care in the ED.  Some ED evaluations and interventions may be delayed as a result of limited staffing during and after the pandemic.*  Note:  This document was prepared using Dragon voice recognition software and may include unintentional dictation errors.   Darci Current, MD 02/05/20 [phone]

## 2020-02-05 LAB — COMPREHENSIVE METABOLIC PANEL
ALT: 15 U/L (ref 0–44)
AST: 15 U/L (ref 15–41)
Albumin: 3.5 g/dL (ref 3.5–5.0)
Alkaline Phosphatase: 43 U/L (ref 38–126)
Anion gap: 12 (ref 5–15)
BUN: 8 mg/dL (ref 6–20)
CO2: 23 mmol/L (ref 22–32)
Calcium: 8.1 mg/dL — ABNORMAL LOW (ref 8.9–10.3)
Chloride: 100 mmol/L (ref 98–111)
Creatinine, Ser: 0.91 mg/dL (ref 0.44–1.00)
GFR calc Af Amer: >60 mL/min (ref >60)
GFR calc non Af Amer: >60 mL/min (ref >60)
Glucose, Bld: 109 mg/dL — ABNORMAL HIGH (ref 70–99)
Potassium: 2.3 mmol/L — CL (ref 3.5–5.1)
Sodium: 135 mmol/L (ref 135–145)
Total Bilirubin: 0.5 mg/dL (ref 0.3–1.2)
Total Protein: 5.8 g/dL — ABNORMAL LOW (ref 6.5–8.1)

## 2020-02-05 LAB — URINALYSIS, COMPLETE (UACMP) WITH MICROSCOPIC
Glucose, UA: NEGATIVE mg/dL
Hgb urine dipstick: NEGATIVE
Ketones, ur: 5 mg/dL — AB
Nitrite: NEGATIVE
Protein, ur: 30 mg/dL — AB
Specific Gravity, Urine: 1.028 (ref 1.005–1.030)
pH: 6 (ref 5.0–8.0)

## 2020-02-05 LAB — CBC WITH DIFFERENTIAL/PLATELET
Abs Immature Granulocytes: 0.03 10*3/uL (ref 0.00–0.07)
Basophils Absolute: 0 10*3/uL (ref 0.0–0.1)
Basophils Relative: 1 %
Eosinophils Absolute: 0 10*3/uL (ref 0.0–0.5)
Eosinophils Relative: 0 %
HCT: 36.4 % (ref 36.0–46.0)
Hemoglobin: 11.9 g/dL — ABNORMAL LOW (ref 12.0–15.0)
Immature Granulocytes: 0 %
Lymphocytes Relative: 43 %
Lymphs Abs: 3.3 10*3/uL (ref 0.7–4.0)
MCH: 32 pg (ref 26.0–34.0)
MCHC: 32.7 g/dL (ref 30.0–36.0)
MCV: 97.8 fL (ref 80.0–100.0)
Monocytes Absolute: 0.6 10*3/uL (ref 0.1–1.0)
Monocytes Relative: 7 %
Neutro Abs: 3.7 10*3/uL (ref 1.7–7.7)
Neutrophils Relative %: 49 %
Platelets: 198 10*3/uL (ref 150–400)
RBC: 3.72 MIL/uL — ABNORMAL LOW (ref 3.87–5.11)
RDW: 13.2 % (ref 11.5–15.5)
WBC: 7.7 10*3/uL (ref 4.0–10.5)
nRBC: 0 % (ref 0.0–0.2)

## 2020-02-05 LAB — URINE DRUG SCREEN, QUALITATIVE (ARMC ONLY)
Amphetamines, Ur Screen: NOT DETECTED
Barbiturates, Ur Screen: POSITIVE — AB
Benzodiazepine, Ur Scrn: NOT DETECTED
Cannabinoid 50 Ng, Ur ~~LOC~~: POSITIVE — AB
Cocaine Metabolite,Ur ~~LOC~~: NOT DETECTED
MDMA (Ecstasy)Ur Screen: NOT DETECTED
Methadone Scn, Ur: NOT DETECTED
Opiate, Ur Screen: NOT DETECTED
Phencyclidine (PCP) Ur S: NOT DETECTED
Tricyclic, Ur Screen: POSITIVE — AB

## 2020-02-05 LAB — PROTIME-INR
INR: 0.9 (ref 0.8–1.2)
Prothrombin Time: 11.6 seconds (ref 11.4–15.2)

## 2020-02-05 LAB — LACTIC ACID, PLASMA: Lactic Acid, Venous: 1.8 mmol/L (ref 0.5–1.9)

## 2020-02-05 LAB — APTT: aPTT: 24 seconds (ref 24–36)

## 2020-02-05 LAB — MAGNESIUM: Magnesium: 1.9 mg/dL (ref 1.7–2.4)

## 2020-02-05 LAB — CK: Total CK: 72 U/L (ref 38–234)

## 2020-02-05 MED ORDER — POTASSIUM CHLORIDE CRYS ER 20 MEQ PO TBCR
40.0000 meq | EXTENDED_RELEASE_TABLET | Freq: Once | ORAL | Status: AC
  Administered 2020-02-05: 40 meq via ORAL
  Filled 2020-02-05: qty 2

## 2020-02-05 MED ORDER — SODIUM CHLORIDE 0.9 % IV BOLUS
1000.0000 mL | Freq: Once | INTRAVENOUS | Status: AC
  Administered 2020-02-05: 1000 mL via INTRAVENOUS

## 2020-02-05 MED ORDER — POTASSIUM CHLORIDE 10 MEQ/100ML IV SOLN
10.0000 meq | INTRAVENOUS | Status: AC
  Administered 2020-02-05 (×2): 10 meq via INTRAVENOUS
  Filled 2020-02-05 (×3): qty 100

## 2020-02-05 NOTE — ED Notes (Signed)
Assisted pt to bathroom at this time. Pt with no other needs.

## 2020-02-06 LAB — URINE CULTURE

## 2020-02-14 DIAGNOSIS — M5416 Radiculopathy, lumbar region: Secondary | ICD-10-CM | POA: Insufficient documentation

## 2020-03-04 ENCOUNTER — Emergency Department
Admission: EM | Admit: 2020-03-04 | Discharge: 2020-03-04 | Disposition: A | Discharge status: Home / Self Care | Payer: Medicaid Other | Attending: Emergency Medicine | Admitting: Emergency Medicine

## 2020-03-04 ENCOUNTER — Other Ambulatory Visit: Payer: Self-pay

## 2020-03-04 DIAGNOSIS — R55 Syncope and collapse: Secondary | ICD-10-CM | POA: Insufficient documentation

## 2020-03-04 DIAGNOSIS — R5381 Other malaise: Secondary | ICD-10-CM | POA: Insufficient documentation

## 2020-03-04 DIAGNOSIS — R42 Dizziness and giddiness: Secondary | ICD-10-CM | POA: Diagnosis not present

## 2020-03-04 DIAGNOSIS — Z5321 Procedure and treatment not carried out due to patient leaving prior to being seen by health care provider: Secondary | ICD-10-CM | POA: Diagnosis not present

## 2020-03-04 NOTE — ED Triage Notes (Signed)
Pt states she got Covid and flu vaccination Saturday and has had general malaise since. States today she was eating and became dizziness. States had syncopal episode, unsure for how long. Hx of the same states due to low bp.

## 2020-03-10 ENCOUNTER — Encounter: Payer: Self-pay | Admitting: Gastroenterology

## 2020-04-28 ENCOUNTER — Ambulatory Visit: Payer: Medicaid Other | Admitting: Nurse Practitioner

## 2020-05-05 ENCOUNTER — Encounter: Payer: Medicaid Other | Admitting: Gastroenterology

## 2020-05-18 ENCOUNTER — Ambulatory Visit: Payer: Medicaid Other | Admitting: Internal Medicine

## 2020-05-25 ENCOUNTER — Ambulatory Visit (INDEPENDENT_AMBULATORY_CARE_PROVIDER_SITE_OTHER): Payer: Medicaid Other | Admitting: Nurse Practitioner

## 2020-05-25 ENCOUNTER — Encounter: Payer: Self-pay | Admitting: Nurse Practitioner

## 2020-05-25 ENCOUNTER — Other Ambulatory Visit (INDEPENDENT_AMBULATORY_CARE_PROVIDER_SITE_OTHER): Payer: Medicaid Other

## 2020-05-25 VITALS — BP 127/70 | HR 88 | Ht 63.0 in | Wt 166.2 lb

## 2020-05-25 DIAGNOSIS — Z1211 Encounter for screening for malignant neoplasm of colon: Secondary | ICD-10-CM | POA: Diagnosis not present

## 2020-05-25 DIAGNOSIS — R1310 Dysphagia, unspecified: Secondary | ICD-10-CM

## 2020-05-25 DIAGNOSIS — K219 Gastro-esophageal reflux disease without esophagitis: Secondary | ICD-10-CM

## 2020-05-25 DIAGNOSIS — R1084 Generalized abdominal pain: Secondary | ICD-10-CM

## 2020-05-25 DIAGNOSIS — R140 Abdominal distension (gaseous): Secondary | ICD-10-CM

## 2020-05-25 LAB — COMPREHENSIVE METABOLIC PANEL
ALT: 21 U/L (ref 0–35)
AST: 15 U/L (ref 0–37)
Albumin: 4 g/dL (ref 3.5–5.2)
Alkaline Phosphatase: 75 U/L (ref 39–117)
BUN: 8 mg/dL (ref 6–23)
CO2: 30 mEq/L (ref 19–32)
Calcium: 8.8 mg/dL (ref 8.4–10.5)
Chloride: 100 mEq/L (ref 96–112)
Creatinine, Ser: 0.65 mg/dL (ref 0.40–1.20)
GFR: 95.59 mL/min (ref >60.00)
Glucose, Bld: 81 mg/dL (ref 70–99)
Potassium: 3.8 mEq/L (ref 3.5–5.1)
Sodium: 136 mEq/L (ref 135–145)
Total Bilirubin: 0.3 mg/dL (ref 0.2–1.2)
Total Protein: 6.5 g/dL (ref 6.0–8.3)

## 2020-05-25 LAB — CBC WITH DIFFERENTIAL/PLATELET
Basophils Absolute: 0 10*3/uL (ref 0.0–0.1)
Basophils Relative: 0.7 % (ref 0.0–3.0)
Eosinophils Absolute: 0 10*3/uL (ref 0.0–0.7)
Eosinophils Relative: 0.6 % (ref 0.0–5.0)
HCT: 40 % (ref 36.0–46.0)
Hemoglobin: 13.5 g/dL (ref 12.0–15.0)
Lymphocytes Relative: 27.7 % (ref 12.0–46.0)
Lymphs Abs: 1.6 10*3/uL (ref 0.7–4.0)
MCHC: 33.9 g/dL (ref 30.0–36.0)
MCV: 93.1 fl (ref 78.0–100.0)
Monocytes Absolute: 0.5 10*3/uL (ref 0.1–1.0)
Monocytes Relative: 8.4 % (ref 3.0–12.0)
Neutro Abs: 3.7 10*3/uL (ref 1.4–7.7)
Neutrophils Relative %: 62.6 % (ref 43.0–77.0)
Platelets: 234 10*3/uL (ref 150.0–400.0)
RBC: 4.29 Mil/uL (ref 3.87–5.11)
RDW: 13.2 % (ref 11.5–15.5)
WBC: 5.9 10*3/uL (ref 4.0–10.5)

## 2020-05-25 LAB — C-REACTIVE PROTEIN: CRP: <1 mg/dL (ref 0.5–20.0)

## 2020-05-25 MED ORDER — NA SULFATE-K SULFATE-MG SULF 17.5-3.13-1.6 GM/177ML PO SOLN: 1.0000 | Freq: Once | ORAL | 0 refills | Status: AC

## 2020-05-25 NOTE — Patient Instructions (Signed)
If you are age 60 or older, your body mass index should be between 23-30. Your Body mass index is 29.44 kg/m. If this is out of the aforementioned range listed, please consider follow up with your Primary Care Provider.  If you are age 2 or younger, your body mass index should be between 19-25. Your Body mass index is 29.44 kg/m. If this is out of the aformentioned range listed, please consider follow up with your Primary Care Provider.   Your provider has requested that you go to the basement level for lab work before leaving today. Press "B" on the elevator. The lab is located at the first door on the left as you exit the elevator.  You have been scheduled for an endoscopy and colonoscopy. Please follow the written instructions given to you at your visit today. Please pick up your prep supplies at the pharmacy within the next 1-3 days. If you use inhalers (even only as needed), please bring them with you on the day of your procedure.  Continue Dicyclomine 10 mg 1 tablet three times daily as needed for abdominal pain  Continue Esomeprazole  Call the office if your Symptoms worsen. We have discussed the possibility of scheduling a Ct if you abdominal pain worsens or persists.   Use Gas X over the counter if needed.  Thank you for entrusting me with your care and choosing [facility].  Alcide Evener, CRNP

## 2020-05-25 NOTE — Progress Notes (Signed)
05/25/2020 Katelyn [name] [id number] [birth year]   CHIEF COMPLAINT: Reflux, schedule EGD and colonoscopy.   HISTORY OF PRESENT ILLNESS: Katelyn [name] is a 59 year old female with a past medical history significant for anxiety, depression, hypertension, COPD, migraine headaches, interstitial cystitis, anemia, GERD and IBS.  Past abdominal hysterectomy, cholecystectomy, hernia repair, neck surgery and shoulder surgery.  She presents to our office today as referred by Dr. Lacie Scotts for further evaluation regarding dysphagia, to schedule an EGD and screening colonoscopy.  She complains of having nighttime heartburn for at least 6 months. She has difficulty swallowing tablets and solid food.  She describes having food gets stuck in the throat or upper esophagus once monthly for the past 6 months.  She drinks water and the stuck food passes down the esophagus.  She reports taking a PPI for the past 20 years.  She reported undergoing an EGD by Dr. Delray Alt 30 years ago which showed a hiatal hernia.  She complains of generalized abdominal bloat and feels as if she has difficulty passing gas per the rectum for the past 3 years.  She describes having chronic generalized abdominal pain which started 2 or 3 years ago and occurs 2 to 3 days weekly.  No significant abdominal pain at this time.  She is passing normal formed brown bowel movement daily as long as she takes a stool softener.  No rectal bleeding.  Her stools are black in color if she takes Pepto-Bismol.  She denies ever having a screening colonoscopy.  She reports her paternal grandmother had colon cancer.  No weight loss.  She has intermittent night sweats.  She has gained 20 pounds over the past 4 months.  She was seen by a Wylie Hail gastroenterology PA-C at [facility] 11/2019, an EGD and colonoscopy were scheduled.  However, her procedures were canceled as she did not proceed with the Covid testing which was required prior to her procedure  and she did not initiate the bowel prep as required.    CBC Latest Ref Rng & Units 02/04/2020 11/16/2017 06/29/2017  WBC 4.0 - 10.5 K/uL 7.7 8.9 10.8  Hemoglobin 12.0 - 15.0 g/dL 11.9(L) 12.7 13.3  Hematocrit 36 - 46 % 36.4 36.9 39.5  Platelets 150 - 400 K/uL 198 233 288  MCV 97.8. CMP Latest Ref Rng & Units 02/04/2020 11/16/2017 06/29/2017  Glucose 70 - 99 mg/dL 740(C) 144(Y) 96  BUN 6 - 20 mg/dL 8 8 11   Creatinine 0.44 - 1.00 mg/dL 1.85 6.31  Sodium 135 - 145 mmol/L 135 129(L) 133(L)  Potassium 3.5 - 5.1 mmol/L 2.3(LL) 3.0(L) 3.5  Chloride 98 - 111 mmol/L 100 95(L) 100(L)  CO2 22 - 32 mmol/L 23 25 25   Calcium 8.9 - 10.3 mg/dL 8.1(L) 8.5(L) 8.8(L)  Total Protein 6.5 - 8.1 g/dL 4.97) - 6.5  Total Bilirubin 0.3 - 1.2 mg/dL 0.5 - 0.6  Alkaline Phos 38 - 126 U/L 43 - 56  AST 15 - 41 U/L 15 - 36  ALT 0 - 44 U/L 15 - 46    Abdominal/pelvic CT 06/29/2017: 1. No bowel obstruction or active inflammation.  Normal appendix. 2. Mild dilatation of the biliary tree status post prior cholecystectomy. Overall interval increase in the degree of dilatation of the intrahepatic bile ducts compared to the prior CT. No calcified retained stone noted in the central CBD. Clinical correlation is recommended. 3. Underdistention of the urinary bladder versus cystitis. Correlation with urinalysis recommended  Past Medical History:  Diagnosis Date  . Anxiety   . COPD (chronic obstructive pulmonary disease) (HCC)   . Depressive disorder   . GERD (gastroesophageal reflux disease)   . Hiatal hernia   . HLD (hyperlipidemia)   . Hypertension   . IBS (irritable bowel syndrome)   . IC (interstitial cystitis)   . Migraine   . Nicotine dependence   . Nontoxic goiter   . Overactive bladder   . Smoking    Past Surgical History:  Procedure Laterality Date  . ABDOMINAL HYSTERECTOMY    . ADENOIDECTOMY    . CHOLECYSTECTOMY    . HERNIA REPAIR    . NECK SURGERY    . SHOULDER SURGERY    . TONSILLECTOMY       Social History: She is divorced.  She has 3 sons.  She reports being disabled/unemployed.  She smokes cigarettes 2ppd x 40+ years. No alcohol. No drug use.   Family History: Mother died 51 from COPD and HTN. Father age 42 with history of heart disease and prostate cancer. Brother. Sister.  Paternal grandmother with history of colon cancer.   Allergies  Allergen Reactions  . Ciprofloxacin Other (See Comments)    Headache  . Macrobid [Nitrofurantoin Monohyd Macro] Itching  . Sulfa Antibiotics Itching      Outpatient Encounter Medications as of 05/25/2020  Medication Sig  . amLODipine (NORVASC) 5 MG tablet Take 1 tablet (5 mg total) by mouth daily.  Marland Kitchen aspirin EC 81 MG tablet Take 1 tablet (81 mg total) by mouth daily.  Marland Kitchen atorvastatin (LIPITOR) 40 MG tablet Take 40 mg by mouth daily.  . benazepril-hydrochlorthiazide (LOTENSIN HCT) 20-25 MG per tablet Take 1 tablet by mouth daily. (Patient taking differently: Take 0.5 tablets by mouth daily. )  . cephALEXin (KEFLEX) 500 MG capsule Take 1 capsule (500 mg total) by mouth 2 (two) times daily.  . Cholecalciferol (VITAMIN D3) 5000 UNITS CAPS Take 1 capsule by mouth See admin instructions. Take on Monday, Wednesday, and Friday  . cyclobenzaprine (FLEXERIL) 5 MG tablet Take 1 tablet (5 mg total) by mouth 3 (three) times daily as needed for muscle spasms.  Marland Kitchen dicyclomine (BENTYL) 20 MG tablet Take 1 tablet (20 mg total) by mouth 3 (three) times daily as needed for spasms.  Marland Kitchen esomeprazole (NEXIUM) 40 MG capsule Take 40 mg by mouth daily at 12 noon.  Marland Kitchen HYDROcodone-acetaminophen (NORCO/VICODIN) 5-325 MG tablet Take 1-2 tablets by mouth every 6 (six) hours as needed for moderate pain.  . magic mouthwash SOLN Take 5 mLs by mouth 3 (three) times daily.  . phenazopyridine (PYRIDIUM) 100 MG tablet Take 1 tablet (100 mg total) by mouth 3 (three) times daily as needed for up to 6 doses for pain.  . predniSONE (DELTASONE) 10 MG tablet Take 6 tabs (60 mg)  PO x 3 days, then take 4 tabs (40 mg) PO x 3 days, then take 2 tabs (20 mg) PO x 3 days, then take 1 tab (10 mg) PO x 3 days, then take 1/2 tab (5 mg) PO x 4 days.  . promethazine (PHENERGAN) 25 MG tablet Take 25 mg by mouth every 6 (six) hours as needed for nausea or vomiting.  . SUMAtriptan (IMITREX) 100 MG tablet Take 1 tablet (100 mg total) by mouth every 2 (two) hours as needed for migraine. May repeat in 2 hours if headache persists or recurs.  . traZODone (DESYREL) 150 MG tablet Take 300 mg by mouth at bedtime.    No facility-administered encounter  medications on file as of 05/25/2020.     REVIEW OF SYSTEMS:   Gen: + fatigue, night sweats. 20 lb weight gain.  CV: Denies chest pain, palpitations or edema. Resp: Denies cough, shortness of breath of hemoptysis.  GI: See HPI. GU : Denies urinary burning, blood in urine, increased urinary frequency or incontinence. MS: back pain. + fibromyalgia, no new joint or muscle pains.  Derm: Denies rash, itchiness, skin lesions or unhealing ulcers. Psych: + Anxiety and depression. Heme: Denies bruising, bleeding. Neuro:  + headaches. No dizziness or paresthesias. Endo:  Denies any problems with DM, thyroid or adrenal function.   PHYSICAL EXAM: BP 127/70   Pulse 88   Ht 5\' 3"  (1.6 m)   Wt 166 lb 3.2 oz (75.4 kg)   SpO2 98%   BMI 29.44 kg/m   General: Well developed 59 year old female in no acute distress. Head: Normocephalic and atraumatic. Eyes:  Sclerae non-icteric, conjunctive pink. Ears: Normal auditory acuity. Mouth: Dentition intact. No ulcers or lesions.  Neck: Supple, no lymphadenopathy or thyromegaly.  Lungs: Few scattered expiratory wheezes bilaterally. Heart: Regular rate and rhythm. No murmur, rub or gallop appreciated.  Abdomen: Soft, nontender, non distended. No masses. No hepatosplenomegaly. Normoactive bowel sounds x 4 quadrants.  Rectal: Deferred. Musculoskeletal: Symmetrical with no gross deformities. Skin: Warm  and dry. No rash or lesions on visible extremities. Extremities: No edema. Neurological: Alert oriented x 4, no focal deficits.  Psychological:  Alert and cooperative. Normal mood and affect.  ASSESSMENT AND PLAN:  5. 59 year old female with GERD and dysphagia -EGD benefits and risks discussed including risk with sedation, risk of bleeding, perforation and infection  -Continue Esomeprazole 40 mg once daily  2.  Colon cancer screening.  Maternal grandmother with history of colon cancer. -Colonoscopy benefits and risks discussed including risk with sedation, risk of bleeding, perforation and infection   3.  Abdominal bloat with intermittent chronic generalized abdominal pain.  No abdominal pain at this time. -CBC, CMP -Discuss scheduling an abdominal/pelvic CT scan if abdominal pain persists/worsens or if laboratory results reveal any evidence of infectious process or hepatobiliary abnormality. -Dicyclomine 10 mg 1 p.o. 3 times daily as needed, patient has supply prior Rx -Gas-X 1 p.o. twice daily as needed  Further recommendations to be determined after the above evaluation completed            CC:  66, MD

## 2020-05-26 DIAGNOSIS — Z1211 Encounter for screening for malignant neoplasm of colon: Secondary | ICD-10-CM | POA: Insufficient documentation

## 2020-05-26 DIAGNOSIS — K219 Gastro-esophageal reflux disease without esophagitis: Secondary | ICD-10-CM | POA: Insufficient documentation

## 2020-05-26 DIAGNOSIS — R140 Abdominal distension (gaseous): Secondary | ICD-10-CM | POA: Insufficient documentation

## 2020-05-26 DIAGNOSIS — R1310 Dysphagia, unspecified: Secondary | ICD-10-CM | POA: Insufficient documentation

## 2020-05-27 NOTE — Progress Notes (Signed)
Reviewed and agree with documentation and assessment and plan. K. Veena Alisen Marsiglia , MD   

## 2020-07-10 ENCOUNTER — Encounter: Payer: Medicaid Other | Admitting: Gastroenterology

## 2020-08-21 ENCOUNTER — Other Ambulatory Visit: Payer: Self-pay

## 2020-08-21 ENCOUNTER — Telehealth: Payer: Self-pay | Admitting: *Deleted

## 2020-08-21 ENCOUNTER — Ambulatory Visit: Payer: Medicaid Other

## 2020-08-21 VITALS — Ht 63.0 in | Wt 165.0 lb

## 2020-08-21 NOTE — Telephone Encounter (Signed)
Called patient to conduct a virtual pre-visit  Patient inquired if she could do Cologuard instead of colonoscopy.  She does not have any person  history, parents had polyps. Patient has COPD with a lot of chronic wheezing and would prefer avoiding anesthesia  if possible   She states that her upper symptoms are not bad and she's ok cancelling that if she can have Cologuard.  Please advise. Thanks!

## 2020-08-21 NOTE — Telephone Encounter (Signed)
If she is average risk for colon cancer (Negative family history and no h/o polyps) and has no GI symptoms she can do Cologaurd but if its positive, she will need colonoscopy for follow up. Thanks

## 2020-08-24 NOTE — Telephone Encounter (Signed)
Can you provided that prescription? Or does she go through her primary?

## 2020-08-24 NOTE — Telephone Encounter (Signed)
Patient returned call and is aware that if test is positive she will still need to have a colonoscopy she still wants a cologuard ordered

## 2020-08-24 NOTE — Telephone Encounter (Signed)
Your provider has ordered Cologuard testing as an option for colon cancer screening. This is performed by [employer] [facility] and may be out of network with your insurance. PRIOR to completing the test, it is YOUR responsibility to contact your insurance about covered benefits for this test. Your out of pocket expense could be anywhere from $0.00 to $649.00.   When you call to check coverage with your insurer, please provide the following information:   -The ONLY provider of Cologuard is [facility], dispensing  - CPT code for Cologuard is [phone].  [facility] NPI # [id number]  -[employer] Tax ID # [id number]   We have already sent your demographic and insurance information to [employer] (phone number [phone]) and they should contact you within the next week regarding your test. If you have not heard from them within the next week, please call our office at [phone].  Will fax over forms today for Cologuard   Left message for pt to return call.

## 2020-08-24 NOTE — Telephone Encounter (Signed)
Yes we can order Cologaurd. Zella Ball, can you please send in the request. Thanks

## 2020-08-24 NOTE — Progress Notes (Unsigned)
Patient decided she wants Cologuard , concerned about anesthesia due to her COPD. Dr Lavon Paganini notified

## 2020-09-04 ENCOUNTER — Encounter: Payer: Medicaid Other | Admitting: Gastroenterology

## 2021-02-23 ENCOUNTER — Other Ambulatory Visit: Payer: Self-pay

## 2021-02-23 ENCOUNTER — Emergency Department: Payer: Medicaid Other

## 2021-02-23 ENCOUNTER — Emergency Department
Admission: EM | Admit: 2021-02-23 | Discharge: 2021-02-23 | Discharge status: Against Medical Advice | Payer: Medicaid Other | Attending: Emergency Medicine | Admitting: Emergency Medicine

## 2021-02-23 DIAGNOSIS — F17210 Nicotine dependence, cigarettes, uncomplicated: Secondary | ICD-10-CM | POA: Insufficient documentation

## 2021-02-23 DIAGNOSIS — E876 Hypokalemia: Secondary | ICD-10-CM | POA: Diagnosis not present

## 2021-02-23 DIAGNOSIS — I10 Essential (primary) hypertension: Secondary | ICD-10-CM | POA: Diagnosis not present

## 2021-02-23 DIAGNOSIS — Z20822 Contact with and (suspected) exposure to covid-19: Secondary | ICD-10-CM | POA: Insufficient documentation

## 2021-02-23 DIAGNOSIS — J449 Chronic obstructive pulmonary disease, unspecified: Secondary | ICD-10-CM | POA: Diagnosis not present

## 2021-02-23 DIAGNOSIS — R55 Syncope and collapse: Secondary | ICD-10-CM | POA: Diagnosis not present

## 2021-02-23 DIAGNOSIS — Z79899 Other long term (current) drug therapy: Secondary | ICD-10-CM | POA: Diagnosis not present

## 2021-02-23 DIAGNOSIS — R519 Headache, unspecified: Secondary | ICD-10-CM | POA: Diagnosis not present

## 2021-02-23 LAB — COMPREHENSIVE METABOLIC PANEL
ALT: 14 U/L (ref 0–44)
AST: 14 U/L — ABNORMAL LOW (ref 15–41)
Albumin: 3 g/dL — ABNORMAL LOW (ref 3.5–5.0)
Alkaline Phosphatase: 53 U/L (ref 38–126)
Anion gap: 6 (ref 5–15)
BUN: 8 mg/dL (ref 8–23)
CO2: 28 mmol/L (ref 22–32)
Calcium: 8 mg/dL — ABNORMAL LOW (ref 8.9–10.3)
Chloride: 97 mmol/L — ABNORMAL LOW (ref 98–111)
Creatinine, Ser: 0.69 mg/dL (ref 0.44–1.00)
GFR, Estimated: >60 mL/min (ref >60)
Glucose, Bld: 96 mg/dL (ref 70–99)
Potassium: 2.8 mmol/L — ABNORMAL LOW (ref 3.5–5.1)
Sodium: 131 mmol/L — ABNORMAL LOW (ref 135–145)
Total Bilirubin: 0.5 mg/dL (ref 0.3–1.2)
Total Protein: 5.6 g/dL — ABNORMAL LOW (ref 6.5–8.1)

## 2021-02-23 LAB — CBC WITH DIFFERENTIAL/PLATELET
Abs Immature Granulocytes: 0.05 10*3/uL (ref 0.00–0.07)
Basophils Absolute: 0 10*3/uL (ref 0.0–0.1)
Basophils Relative: 0 %
Eosinophils Absolute: 0.1 10*3/uL (ref 0.0–0.5)
Eosinophils Relative: 1 %
HCT: 34.1 % — ABNORMAL LOW (ref 36.0–46.0)
Hemoglobin: 11.8 g/dL — ABNORMAL LOW (ref 12.0–15.0)
Immature Granulocytes: 1 %
Lymphocytes Relative: 20 %
Lymphs Abs: 1.9 10*3/uL (ref 0.7–4.0)
MCH: 31.6 pg (ref 26.0–34.0)
MCHC: 34.6 g/dL (ref 30.0–36.0)
MCV: 91.4 fL (ref 80.0–100.0)
Monocytes Absolute: 0.7 10*3/uL (ref 0.1–1.0)
Monocytes Relative: 7 %
Neutro Abs: 6.6 10*3/uL (ref 1.7–7.7)
Neutrophils Relative %: 71 %
Platelets: 222 10*3/uL (ref 150–400)
RBC: 3.73 MIL/uL — ABNORMAL LOW (ref 3.87–5.11)
RDW: 12.4 % (ref 11.5–15.5)
WBC: 9.3 10*3/uL (ref 4.0–10.5)
nRBC: 0 % (ref 0.0–0.2)

## 2021-02-23 LAB — TROPONIN I (HIGH SENSITIVITY)
Troponin I (High Sensitivity): 3 ng/L (ref <18)
Troponin I (High Sensitivity): 4 ng/L (ref <18)

## 2021-02-23 LAB — RESP PANEL BY RT-PCR (FLU A&B, COVID) ARPGX2
Influenza A by PCR: NEGATIVE
Influenza B by PCR: NEGATIVE
SARS Coronavirus 2 by RT PCR: NEGATIVE

## 2021-02-23 LAB — MAGNESIUM: Magnesium: 1.8 mg/dL (ref 1.7–2.4)

## 2021-02-23 LAB — ETHANOL: Alcohol, Ethyl (B): <10 mg/dL (ref <10)

## 2021-02-23 MED ORDER — POTASSIUM CHLORIDE CRYS ER 20 MEQ PO TBCR: 20.0000 meq | EXTENDED_RELEASE_TABLET | Freq: Two times a day (BID) | ORAL | 0 refills | Status: DC

## 2021-02-23 MED ORDER — ACETAMINOPHEN 500 MG PO TABS
1000.0000 mg | ORAL_TABLET | Freq: Once | ORAL | Status: AC
  Administered 2021-02-23: 1000 mg via ORAL
  Filled 2021-02-23: qty 2

## 2021-02-23 MED ORDER — SODIUM CHLORIDE 0.9 % IV BOLUS
1000.0000 mL | Freq: Once | INTRAVENOUS | Status: AC
  Administered 2021-02-23: 1000 mL via INTRAVENOUS

## 2021-02-23 MED ORDER — POTASSIUM CHLORIDE CRYS ER 20 MEQ PO TBCR
40.0000 meq | EXTENDED_RELEASE_TABLET | Freq: Once | ORAL | Status: AC
  Administered 2021-02-23: 40 meq via ORAL
  Filled 2021-02-23: qty 2

## 2021-02-23 MED ORDER — POTASSIUM CHLORIDE 10 MEQ/100ML IV SOLN
10.0000 meq | INTRAVENOUS | Status: AC
  Administered 2021-02-23 (×2): 10 meq via INTRAVENOUS
  Filled 2021-02-23 (×2): qty 100

## 2021-02-23 NOTE — ED Triage Notes (Signed)
[facility] had a syncope episode; pt hypotensive at 78/54; pt has 500cc; pt was sitting outside porch with family and then fell on the ground family did chest compression for 1 minutes and pt became responsive; cbg wdl; pt hx HTN; denies CP;

## 2021-02-23 NOTE — ED Provider Notes (Signed)
[facility] Emergency Department Provider Note  ____________________________________________   Event Date/Time   First MD Initiated Contact with Patient 02/23/21 0017     (approximate)  I have reviewed the triage vital signs and the nursing notes.   HISTORY  Chief Complaint Near Syncope    HPI Katelyn [name] is a 60 y.o. female with hypertension, hyperlipidemia, COPD who comes in with syncopal episode.  Patient was at home when she was sitting on the porch talking to her family when she had a syncopal episode.  According to triage note she fell to the ground but according to the patient she states that she thinks she passed out in the chair and was lowered to the ground.  It is reported that patient had chest compressions for 1 minute by family.  Is not clear if anyone felt for a pulse.  Patient was responsive afterwards.  Total time of syncope was 2 to 3 minutes.  Patient states that before she passed out she started just feeling really awful and lightheaded and then she went out.  Denies any chest pain, worsening shortness of breath.  She states that she is got known COPD and is about her baseline.  Denies any abdominal pain.  She does report a headache at this time but does not feel like she hit her head but is moderate, constant, nothing makes it better or worse.  Patient states that she may be has not eaten as much today.  She does report a prior syncopal episode but came to the ER left without being seen without any lab work being done.  She denies any alcohol or drugs besides her normal medications.  She is on medication for hypertension and denies any recent changes.          Past Medical History:  Diagnosis Date   Anxiety    COPD (chronic obstructive pulmonary disease) (HCC)    Depressive disorder    GERD (gastroesophageal reflux disease)    Hiatal hernia    HLD (hyperlipidemia)    Hypertension    IBS (irritable bowel syndrome)    IC  (interstitial cystitis)    Migraine    Nicotine dependence    Nontoxic goiter    Overactive bladder    Smoking     Patient Active Problem List   Diagnosis Date Noted   GERD (gastroesophageal reflux disease) 05/26/2020   Dysphagia 05/26/2020   Colon cancer screening 05/26/2020   Abdominal bloating 05/26/2020   Urinary retention 07/17/2017   Chest pain 01/27/2014   HTN (hypertension) 01/27/2014   HLD (hyperlipidemia) 01/27/2014    Past Surgical History:  Procedure Laterality Date   ABDOMINAL HYSTERECTOMY     ADENOIDECTOMY     CHOLECYSTECTOMY     HERNIA REPAIR     NECK SURGERY     SHOULDER SURGERY     TONSILLECTOMY      Prior to Admission medications   Medication Sig Start Date End Date Taking? Authorizing Provider  acyclovir (ZOVIRAX) 800 MG tablet Take 800 mg by mouth 3 (three) times daily. 07/11/20   [provider]  amLODipine (NORVASC) 5 MG tablet Take 1 tablet (5 mg total) by mouth daily. 01/27/14   Esperanza Sheets, MD  atorvastatin (LIPITOR) 40 MG tablet Take 40 mg by mouth daily. 01/01/15   [provider]  butalbital-acetaminophen-caffeine (FIORICET) 50-325-40 MG tablet Take 1 tablet by mouth daily as needed. 04/03/19   [provider]  Cholecalciferol (VITAMIN D3) 5000 UNITS  CAPS Take 1 capsule by mouth See admin instructions. Take on Monday, Wednesday, and Friday    [provider]  cloNIDine (CATAPRES) 0.1 MG tablet Take 0.1 mg by mouth 2 (two) times daily. 04/29/20   [provider]  cyclobenzaprine (FLEXERIL) 5 MG tablet Take 1 tablet (5 mg total) by mouth 3 (three) times daily as needed for muscle spasms. 01/14/18   Loleta Rose, MD  D3-50 1.25 MG (50000 UT) capsule Take 50,000 Units by mouth once a week. 08/18/20   [provider]  dicyclomine (BENTYL) 20 MG tablet Take 1 tablet (20 mg total) by mouth 3 (three) times daily as needed for spasms. 06/08/17   Sharman Cheek, MD  esomeprazole (NEXIUM) 40 MG  capsule Take 40 mg by mouth daily at 12 noon.    [provider]  famotidine (PEPCID) 40 MG tablet Take 40 mg by mouth at bedtime. 06/15/20   [provider]  FLOVENT HFA 220 MCG/ACT inhaler Inhale 2 puffs into the lungs 2 (two) times daily. 08/15/20   [provider]  gabapentin (NEURONTIN) 600 MG tablet Take 600 mg by mouth 3 (three) times daily. 04/07/20   [provider]  losartan-hydrochlorothiazide (HYZAAR) 100-12.5 MG tablet Take 1 tablet by mouth daily. 10/27/19   [provider]  methocarbamol (ROBAXIN) 500 MG tablet Take 500 mg by mouth 3 (three) times daily. 08/01/20   [provider]  metoCLOPramide (REGLAN) 10 MG tablet Take 10 mg by mouth 3 (three) times daily as needed. 06/04/20   [provider]  phenazopyridine (PYRIDIUM) 200 MG tablet Take 200 mg by mouth 2 (two) times daily as needed. 02/28/20   [provider]  PREMARIN vaginal cream Place 1 Applicatorful vaginally daily. 03/26/20   [provider]  PROAIR HFA 108 (90 Base) MCG/ACT inhaler Inhale 1-2 puffs into the lungs as needed. 03/20/20   [provider]  promethazine (PHENERGAN) 25 MG tablet Take 25 mg by mouth every 6 (six) hours as needed for nausea or vomiting.    [provider]  SUMAtriptan (IMITREX) 100 MG tablet Take 1 tablet (100 mg total) by mouth every 2 (two) hours as needed for migraine. May repeat in 2 hours if headache persists or recurs. 01/27/14   Esperanza Sheets, MD    Allergies Ciprofloxacin, Macrobid [nitrofurantoin monohyd macro], and Sulfa antibiotics  Family History  Problem Relation Age of Onset   CAD Father        onset age 63   Heart disease Father    Kidney disease Father    Prostate cancer Father    Colon polyps Maternal Grandmother    Colon cancer Paternal Grandmother    Bladder Cancer Neg Hx    Kidney cancer Neg Hx     Social History Social History   Tobacco Use   Smoking status: Every  Day    Packs/day: 2.00    Types: Cigarettes   Smokeless tobacco: Never  Substance Use Topics   Alcohol use: No   Drug use: No      Review of Systems Constitutional: Positive chills, syncope Eyes: No visual changes. ENT: No sore throat. Cardiovascular: Denies chest pain. Respiratory: Denies shortness of breath. Gastrointestinal: No abdominal pain.  No nausea, no vomiting.  No diarrhea.  No constipation. Genitourinary: Negative for dysuria. Musculoskeletal: Negative for back pain. Skin: Negative for rash. Neurological: Positive headache, no focal weakness or numbness. All other ROS negative ____________________________________________   PHYSICAL EXAM:  VITAL SIGNS: ED Triage Vitals [  02/23/21 0013]  Enc Vitals Group     BP (!) 87/63     Pulse Rate 65     Resp 14     Temp 97.6 F (36.4 C)     Temp Source Oral     SpO2 100 %     Weight 165 lb (74.8 kg)     Height 5\' 5"  (1.651 m)     Head Circumference      Peak Flow      Pain Score 0     Pain Loc      Pain Edu?      Excl. in GC?     Constitutional: Alert and oriented. Well appearing and in no acute distress. Eyes: Conjunctivae are normal. EOMI. Head: Atraumatic. Nose: No congestion/rhinnorhea. Mouth/Throat: Mucous membranes are moist.   Neck: No stridor. Trachea Midline.  Limited range of motion secondary to prior neck surgeries.  Does report some chronic pain. Cardiovascular: Normal rate, regular rhythm. Grossly normal heart sounds.  Good peripheral circulation.  No chest wall tenderness and no bruising's from the reported compression Respiratory: Normal respiratory effort.  No retractions. Lungs CTAB. Gastrointestinal: Soft and nontender. No distention. No abdominal bruits.  Musculoskeletal: No lower extremity tenderness nor edema.  No joint effusions. Neurologic:  Normal speech and language. No gross focal neurologic deficits are appreciated.  Equal strength in arms and legs. Skin:  Skin is warm, dry and  intact. No rash noted. Psychiatric: Mood and affect are normal. Speech and behavior are normal. GU: Deferred   ____________________________________________   LABS (all labs ordered are listed, but only abnormal results are displayed)  Labs Reviewed  CBC WITH DIFFERENTIAL/PLATELET - Abnormal; Notable for the following components:      Result Value   RBC 3.73 (*)    Hemoglobin 11.8 (*)    HCT 34.1 (*)    All other components within normal limits  COMPREHENSIVE METABOLIC PANEL - Abnormal; Notable for the following components:   Sodium 131 (*)    Potassium 2.8 (*)    Chloride 97 (*)    Calcium 8.0 (*)    Total Protein 5.6 (*)    Albumin 3.0 (*)    AST 14 (*)    All other components within normal limits  RESP PANEL BY RT-PCR (FLU A&B, COVID) ARPGX2  MAGNESIUM  ETHANOL  URINALYSIS, ROUTINE W REFLEX MICROSCOPIC  URINE DRUG SCREEN, QUALITATIVE ([facility] ONLY)  TROPONIN I (HIGH SENSITIVITY)  TROPONIN I (HIGH SENSITIVITY)   ____________________________________________   ED ECG REPORT I, , the attending physician, personally viewed and interpreted this ECG.  Normal sinus rate of 63, no ST elevation, T wave version aVL, normal intervals ____________________________________________  RADIOLOGY Concha Se, personally viewed and evaluated these images (plain radiographs) as part of my medical decision making, as well as reviewing the written report by the radiologist.  ED MD interpretation:  no ICH   Official radiology report(s): CT HEAD WO CONTRAST (Vela Prose)  Result Date: 02/23/2021 CLINICAL DATA:  Syncope, hypotension EXAM: CT HEAD WITHOUT CONTRAST TECHNIQUE: Contiguous axial images were obtained from the base of the skull through the vertex without intravenous contrast. COMPARISON:  None. FINDINGS: Brain: No acute intracranial abnormality. Specifically, no hemorrhage, hydrocephalus, mass lesion, acute infarction, or significant intracranial injury. Vascular: No hyperdense  vessel or unexpected calcification. Skull: No acute calvarial abnormality. Sinuses/Orbits: No acute findings Other: None IMPRESSION: Normal study. Electronically Signed   By: 04/25/2021 M.D.   On: 02/23/2021 01:03  CT Cervical Spine Wo Contrast  Result Date: 02/23/2021 CLINICAL DATA:  Syncope. Neck trauma, dangerous injury mechanism (Age 61-64y) EXAM: CT CERVICAL SPINE WITHOUT CONTRAST TECHNIQUE: Multidetector CT imaging of the cervical spine was performed without intravenous contrast. Multiplanar CT image reconstructions were also generated. COMPARISON:  05/06/2005 FINDINGS: Alignment: Normal Skull base and vertebrae: No acute fracture. No primary bone lesion or focal pathologic process. Soft tissues and spinal canal: No prevertebral fluid or swelling. No visible canal hematoma. Disc levels: Prior anterior fusion from C5-C7. Posterior fusion changes noted from C2-T1. Upper chest: No acute findings Other: None IMPRESSION: Postoperative changes.  No acute bony abnormality. Electronically Signed   By: Charlett NoseKevin  Dover M.D.   On: 02/23/2021 01:05   DG Chest Portable 1 View  Result Date: 02/23/2021 CLINICAL DATA:  Chest pain EXAM: PORTABLE CHEST 1 VIEW COMPARISON:  02/04/2020 FINDINGS: Heart and mediastinal contours are within normal limits. No focal opacities or effusions. No acute bony abnormality. Aortic atherosclerosis. IMPRESSION: No active cardiopulmonary disease. Electronically Signed   By: Charlett NoseKevin  Dover M.D.   On: 02/23/2021 00:39    ____________________________________________   PROCEDURES  Procedure(s) performed (including Critical Care):  .1-3 Lead EKG Interpretation  Date/Time: 02/23/2021 12:24 AM Performed by: Concha SeFunke, Dawana Asper E, MD Authorized by: Concha SeFunke, Georgeana Oertel E, MD     Interpretation: normal     ECG rate:  60s   ECG rate assessment: normal     Rhythm: sinus rhythm     Ectopy: none     Conduction: normal     ____________________________________________   INITIAL IMPRESSION / ASSESSMENT  AND PLAN / ED COURSE  Katelyn DallyEllen A Dopson was evaluated in Emergency Department on 02/23/2021 for the symptoms described in the history of present illness. She was evaluated in the context of the global COVID-19 pandemic, which necessitated consideration that the patient might be at risk for infection with the SARS-CoV-2 virus that causes COVID-19. Institutional protocols and algorithms that pertain to the evaluation of patients at risk for COVID-19 are in a state of rapid change based on information released by regulatory bodies including the CDC and federal and state organizations. These policies and algorithms were followed during the patient's care in the ED.    Patient presented with syncopal episode and hypotension.  Does report not eating or drinking as much today.  We will give 1 L of fluid and reassess.  Given the report of chest compressions on scene will get chest x-ray to make sure no evidence of rib fractures although patient is nontender and there is no bruising noted.  Labs ordered to evaluate for electrolyte abnormalities, AKI.  We will keep patient the cardiac monitor to evaluate for any arrhythmia.  EKG without any evidence of arrhythmia at this time.  Patient is having a headache.  It sounds like patient was lowered to the ground but given the headache will get CT head to make sure no evidence of intercranial hemorrhage and CT cervical just to make sure no evidence of cervical fracture from potential fall.  Labs are reassuring except for slightly low potassium at 2.8.  Giving some IV and oral repletion.  Sodium and chloride were also slightly low but is gotten some IV fluids.  Her initial troponin was negative but will get repeat.  COVID test is negative.  I discussed with patient admission for cardiac monitoring and repeat echo.  Patient has not had an echo in over 5 years and given the hypotension I think that it would be the safest  option.  However patient is adamant that she would like to go  home.  She is willing to stay for repeat troponin and monitoring of her blood pressure.  Repeat troponin is negative.  Patient is gone to the bathroom multiple times but never got a urine sample collected.  She denies any urinary symptoms.  On repeat evaluation I recommended again admission given the concern for potentially getting chest compressions, for cardiac monitoring and echocardiogram.  Patient still does not want to stay.  She understands the risk including death or permanent disability.  She has a capacity make this decision.  She has a partner who witnesses me discussing this.  At this time we will have her leave AGAINST MEDICAL ADVICE but she will follow-up with cardiology we will give her a potassium prescription.     ____________________________________________   FINAL CLINICAL IMPRESSION(S) / ED DIAGNOSES   Final diagnoses:  Syncope and collapse  Hypokalemia      MEDICATIONS GIVEN DURING THIS VISIT:  Medications  acetaminophen (TYLENOL) tablet 1,000 mg (1,000 mg Oral Given 02/23/21 0041)  sodium chloride 0.9 % bolus 1,000 mL (1,000 mLs Intravenous Bolus 02/23/21 0021)  potassium chloride 10 mEq in 100 mL IVPB (10 mEq Intravenous New Bag/Given 02/23/21 0319)  potassium chloride SA (KLOR-CON) CR tablet 40 mEq (40 mEq Oral Given 02/23/21 0253)     ED Discharge Orders          Ordered    potassium chloride SA (KLOR-CON) 20 MEQ tablet  2 times daily        02/23/21 0424             Note:  This document was prepared using Dragon voice recognition software and may include unintentional dictation errors.    Concha Se, MD 02/23/21 [phone]

## 2021-02-23 NOTE — ED Notes (Signed)
Pt wanting to leave despite doctors advice to stay the night for observation. Pt verbalized understanding and will follow up with other medical care recommended by the ED Doctor.

## 2021-02-23 NOTE — Discharge Instructions (Addendum)
I recommended admission to monitor your heart get a repeat echocardiogram given the concern for syncope with your low blood pressure but you have elected to want to leave.  If you change your mind and and have recurrent symptoms you should return to the ER for further work-up.  Your potassium was low and you should take some oral repletion and you should follow-up with your primary care doctor soon as possible for a recheck.  Please call the cardiology number to schedule a cardiologist appointment to discuss echocardiogram and Holter monitor outpatient.

## 2021-04-15 ENCOUNTER — Other Ambulatory Visit: Payer: Self-pay

## 2021-04-15 ENCOUNTER — Encounter: Payer: Self-pay | Admitting: Cardiology

## 2021-04-15 ENCOUNTER — Ambulatory Visit (INDEPENDENT_AMBULATORY_CARE_PROVIDER_SITE_OTHER): Payer: Medicaid Other | Admitting: Cardiology

## 2021-04-15 VITALS — BP 140/84 | HR 76 | Ht 64.0 in | Wt 159.0 lb

## 2021-04-15 DIAGNOSIS — R0602 Shortness of breath: Secondary | ICD-10-CM

## 2021-04-15 DIAGNOSIS — I10 Essential (primary) hypertension: Secondary | ICD-10-CM | POA: Diagnosis not present

## 2021-04-15 DIAGNOSIS — E7800 Pure hypercholesterolemia, unspecified: Secondary | ICD-10-CM | POA: Diagnosis not present

## 2021-04-15 DIAGNOSIS — F17200 Nicotine dependence, unspecified, uncomplicated: Secondary | ICD-10-CM | POA: Diagnosis not present

## 2021-04-15 MED ORDER — AMLODIPINE BESYLATE 10 MG PO TABS: 10.0000 mg | ORAL_TABLET | Freq: Every day | ORAL | Status: DC

## 2021-04-15 NOTE — Progress Notes (Signed)
Cardiology Office Note:    Date:  04/15/2021   ID:  [name], DOB 12/12/1960, MRN [id number]  PCP:  Evelene Croon, MD   [facility] Providers Cardiologist:  Debbe Odea, MD     Referring MD: Evelene Croon, MD   Chief Complaint  Patient presents with   New Patient (Initial Visit)    Referred by PCP for HTN. Meds reviewed verbally with patient.     History of Present Illness:    Katelyn [name] is a 60 y.o. female with a hx of hypertension, hyperlipidemia, current smoker x40+ years, COPD, migraines presents with elevated blood pressure.  Patient states having excruciating headaches about 2 to 3 weeks ago.  Her blood pressures were 190s systolic.  Primary care provider increased her dose of medications including Hyzaar and amlodipine.  Blood pressures have been better controlled, she still has a headache.  She has had migraines since age 23.  Plans to follow-up with neurology next week.  Denies chest pain, states having shortness of breath with exertion, due to COPD.  Shortness of breath gets worse when it is cold.  Past Medical History:  Diagnosis Date   Anxiety    COPD (chronic obstructive pulmonary disease) (HCC)    Depressive disorder    GERD (gastroesophageal reflux disease)    Hiatal hernia    HLD (hyperlipidemia)    Hypertension    IBS (irritable bowel syndrome)    IC (interstitial cystitis)    Migraine    Nicotine dependence    Nontoxic goiter    Overactive bladder    Smoking     Past Surgical History:  Procedure Laterality Date   ABDOMINAL HYSTERECTOMY     ADENOIDECTOMY     CHOLECYSTECTOMY     HERNIA REPAIR     NECK SURGERY     SHOULDER SURGERY     TONSILLECTOMY      Current Medications: Current Meds  Medication Sig   atorvastatin (LIPITOR) 40 MG tablet Take 40 mg by mouth daily.   butalbital-acetaminophen-caffeine (FIORICET) 50-325-40 MG tablet Take 1 tablet by mouth daily as needed.   cloNIDine (CATAPRES) 0.1 MG tablet Take  0.1 mg by mouth 2 (two) times daily.   cyclobenzaprine (FLEXERIL) 5 MG tablet Take 1 tablet (5 mg total) by mouth 3 (three) times daily as needed for muscle spasms.   D3-50 1.25 MG (50000 UT) capsule Take 50,000 Units by mouth once a week.   dicyclomine (BENTYL) 20 MG tablet Take 1 tablet (20 mg total) by mouth 3 (three) times daily as needed for spasms.   esomeprazole (NEXIUM) 40 MG capsule Take 40 mg by mouth daily at 12 noon.   famotidine (PEPCID) 40 MG tablet Take 40 mg by mouth at bedtime.   FLOVENT HFA 220 MCG/ACT inhaler Inhale 2 puffs into the lungs 2 (two) times daily.   gabapentin (NEURONTIN) 600 MG tablet Take 600 mg by mouth 3 (three) times daily.   losartan-hydrochlorothiazide (HYZAAR) 100-12.5 MG tablet Take 1 tablet by mouth daily.   methocarbamol (ROBAXIN) 500 MG tablet Take 500 mg by mouth 3 (three) times daily.   metoCLOPramide (REGLAN) 10 MG tablet Take 10 mg by mouth 3 (three) times daily as needed.   PREMARIN vaginal cream Place 1 Applicatorful vaginally daily.   PROAIR HFA 108 (90 Base) MCG/ACT inhaler Inhale 1-2 puffs into the lungs as needed.   promethazine (PHENERGAN) 25 MG tablet Take 25 mg by mouth every 6 (six) hours as needed for nausea or vomiting.  SUMAtriptan (IMITREX) 100 MG tablet Take 1 tablet (100 mg total) by mouth every 2 (two) hours as needed for migraine. May repeat in 2 hours if headache persists or recurs.   [DISCONTINUED] amLODipine (NORVASC) 5 MG tablet Take 1 tablet (5 mg total) by mouth daily.     Allergies:   Ciprofloxacin, Macrobid [nitrofurantoin monohyd macro], and Sulfa antibiotics   Social History   Socioeconomic History   Marital status: Divorced    Spouse name: Not on file   Number of children: 3   Years of education: Not on file   Highest education level: Not on file  Occupational History    Comment: Disability  Tobacco Use   Smoking status: Every Day    Packs/day: 2.00    Types: Cigarettes   Smokeless tobacco: Never   Substance and Sexual Activity   Alcohol use: No   Drug use: No   Sexual activity: Yes    Birth control/protection: Surgical  Other Topics Concern   Not on file  Social History Narrative   Not on file   Social Determinants of Health   Financial Resource Strain: Not on file  Food Insecurity: Not on file  Transportation Needs: Not on file  Physical Activity: Not on file  Stress: Not on file  Social Connections: Not on file     Family History: The patient's family history includes CAD in her father; Colon cancer in her paternal grandmother; Colon polyps in her maternal grandmother; Heart disease in her father; Kidney disease in her father; Prostate cancer in her father. There is no history of Bladder Cancer or Kidney cancer.  ROS:   Please see the history of present illness.     All other systems reviewed and are negative.  EKGs/Labs/Other Studies Reviewed:    The following studies were reviewed today:   EKG:  EKG is  ordered today.  The ekg ordered today demonstrates normal sinus rhythm, normal ECG  Recent Labs: 02/23/2021: ALT 14; BUN 8; Creatinine, Ser 0.69; Hemoglobin 11.8; Magnesium 1.8; Platelets 222; Potassium 2.8; Sodium 131  Recent Lipid Panel    Component Value Date/Time   CHOL  11/14/2007 0330    136        ATP III CLASSIFICATION:  <200     mg/dL   Desirable  132-440  mg/dL   Borderline High  >=102    mg/dL   High   TRIG 74 72/53/6644 0330   HDL 39 (L) 11/14/2007 0330   CHOLHDL 3.5 11/14/2007 0330   VLDL 15 11/14/2007 0330   LDLCALC  11/14/2007 0330    82        Total Cholesterol/HDL:CHD Risk Coronary Heart Disease Risk Table                     Men   Women  1/2 Average Risk   3.4   3.3     Risk Assessment/Calculations:          Physical Exam:    VS:  BP 140/84 (BP Location: Left Arm, Patient Position: Sitting, Cuff Size: Normal)   Pulse 76   Ht 5\' 4"  (1.626 m)   Wt 159 lb (72.1 kg)   SpO2 98%   BMI 27.29 kg/m     Wt Readings from Last 3  Encounters:  04/15/21 159 lb (72.1 kg)  02/23/21 165 lb (74.8 kg)  08/21/20 165 lb (74.8 kg)     GEN:  Well nourished, mild distress/headaches HEENT: Normal NECK: No JVD;  No carotid bruits LYMPHATICS: No lymphadenopathy CARDIAC: RRR, no murmurs, rubs, gallops RESPIRATORY: Diminished breath sounds, mild wheezing ABDOMEN: Soft, non-tender, non-distended MUSCULOSKELETAL:  No edema; No deformity  SKIN: Warm and dry NEUROLOGIC:  Alert and oriented x 3 PSYCHIATRIC:  Normal affect   ASSESSMENT:    1. Shortness of breath   2. Primary hypertension   3. Pure hypercholesterolemia   4. Smoking    PLAN:    In order of problems listed above:  Shortness of breath, possibly from COPD.  Get echocardiogram to evaluate any structural abnormalities.  She denies chest pain. Hypertension, BP better controlled.  Migraine/headaches also contributing to elevated BP.  Patient advised to call with current BP medication doses.  Continue amlodipine 10 mg, Hyzaar.  Consider adding hydralazine and getting renal arterial ultrasound if BP stays elevated. Hyperlipidemia, continue Lipitor, start aspirin 81 mg daily. Current smoker, cessation advised.  Follow up after echocardiogram.     Medication Adjustments/Labs and Tests Ordered: Current medicines are reviewed at length with the patient today.  Concerns regarding medicines are outlined above.  Orders Placed This Encounter  Procedures   EKG 12-Lead   ECHOCARDIOGRAM COMPLETE    Meds ordered this encounter  Medications   amLODipine (NORVASC) 10 MG tablet    Sig: Take 1 tablet (10 mg total) by mouth daily.     Patient Instructions  Medication Instructions:   PLEASE CALL us WITH YOUR CORRECT HYZAAR DOSE.  *If you need a refill on your cardiac medications before your next appointment, please call your pharmacy*   Lab Work: None ordered If you have labs (blood work) drawn today and your tests are completely normal, you will receive your  results only by: MyChart Message (if you have MyChart) OR A paper copy in the mail If you have any lab test that is abnormal or we need to change your treatment, we will call you to review the results.   Testing/Procedures:  Your physician has requested that you have an echocardiogram. Echocardiography is a painless test that uses sound waves to create images of your heart. It provides your doctor with information about the size and shape of your heart and how well your heart's chambers and valves are working. This procedure takes approximately one hour. There are no restrictions for this procedure.    Follow-Up: At [facility], you and your health needs are our priority.  As part of our continuing mission to provide you with exceptional heart care, we have created designated Provider Care Teams.  These Care Teams include your primary Cardiologist (physician) and Advanced Practice Providers (APPs -  Physician Assistants and Nurse Practitioners) who all work together to provide you with the care you need, when you need it.  We recommend signing up for the patient portal called "MyChart".  Sign up information is provided on this After Visit Summary.  MyChart is used to connect with patients for Virtual Visits (Telemedicine).  Patients are able to view lab/test results, encounter notes, upcoming appointments, etc.  Non-urgent messages can be sent to your provider as well.   To learn more about what you can do with MyChart, go to [email or web address].    Your next appointment:   2 month(s)  The format for your next appointment:   In Person  Provider:   You may see Dr. Azucena Cecil  or one of the following Advanced Practice Providers on your designated Care Team:   Nicolasa Ducking, NP Eula Listen, PA-C Cadence Fransico Michael, [place]   Other Instructions  Signed, Debbe Odea, MD  04/15/2021 12:33 PM    [facility]

## 2021-04-15 NOTE — Patient Instructions (Signed)
Medication Instructions:   PLEASE CALL us WITH YOUR CORRECT HYZAAR DOSE.  *If you need a refill on your cardiac medications before your next appointment, please call your pharmacy*   Lab Work: None ordered If you have labs (blood work) drawn today and your tests are completely normal, you will receive your results only by: MyChart Message (if you have MyChart) OR A paper copy in the mail If you have any lab test that is abnormal or we need to change your treatment, we will call you to review the results.   Testing/Procedures:  Your physician has requested that you have an echocardiogram. Echocardiography is a painless test that uses sound waves to create images of your heart. It provides your doctor with information about the size and shape of your heart and how well your heart's chambers and valves are working. This procedure takes approximately one hour. There are no restrictions for this procedure.    Follow-Up: At [facility], you and your health needs are our priority.  As part of our continuing mission to provide you with exceptional heart care, we have created designated Provider Care Teams.  These Care Teams include your primary Cardiologist (physician) and Advanced Practice Providers (APPs -  Physician Assistants and Nurse Practitioners) who all work together to provide you with the care you need, when you need it.  We recommend signing up for the patient portal called "MyChart".  Sign up information is provided on this After Visit Summary.  MyChart is used to connect with patients for Virtual Visits (Telemedicine).  Patients are able to view lab/test results, encounter notes, upcoming appointments, etc.  Non-urgent messages can be sent to your provider as well.   To learn more about what you can do with MyChart, go to [email or web address].    Your next appointment:   2 month(s)  The format for your next appointment:   In Person  Provider:   You may see Dr.  Azucena Cecil  or one of the following Advanced Practice Providers on your designated Care Team:   Nicolasa Ducking, NP Eula Listen, PA-C Cadence Fransico Michael, [place]   Other Instructions

## 2021-04-16 ENCOUNTER — Telehealth: Payer: Self-pay | Admitting: Cardiology

## 2021-04-16 MED ORDER — LOSARTAN POTASSIUM-HCTZ 100-25 MG PO TABS: 1.0000 | ORAL_TABLET | Freq: Every day | ORAL | Status: DC

## 2021-04-16 NOTE — Telephone Encounter (Signed)
Updated med list. Will route to Dr. Azucena Cecil as an Lorain Childes, patient seen in clinic yesterday and was asked to provide [place] with dose she is on.

## 2021-04-16 NOTE — Telephone Encounter (Signed)
Patient wanted [place] to know she is taking Hyzaar 100-25 mg/ once a day

## 2021-04-27 ENCOUNTER — Emergency Department: Payer: Medicaid Other

## 2021-04-27 ENCOUNTER — Other Ambulatory Visit: Payer: Self-pay

## 2021-04-27 ENCOUNTER — Emergency Department
Admission: EM | Admit: 2021-04-27 | Discharge: 2021-04-27 | Disposition: A | Discharge status: Home / Self Care | Payer: Medicaid Other | Attending: Emergency Medicine | Admitting: Emergency Medicine

## 2021-04-27 DIAGNOSIS — Z5321 Procedure and treatment not carried out due to patient leaving prior to being seen by health care provider: Secondary | ICD-10-CM | POA: Insufficient documentation

## 2021-04-27 DIAGNOSIS — R52 Pain, unspecified: Secondary | ICD-10-CM | POA: Diagnosis present

## 2021-04-27 DIAGNOSIS — Z20822 Contact with and (suspected) exposure to covid-19: Secondary | ICD-10-CM | POA: Insufficient documentation

## 2021-04-27 LAB — BASIC METABOLIC PANEL
Anion gap: 7 (ref 5–15)
BUN: 9 mg/dL (ref 8–23)
CO2: 30 mmol/L (ref 22–32)
Calcium: 8.8 mg/dL — ABNORMAL LOW (ref 8.9–10.3)
Chloride: 91 mmol/L — ABNORMAL LOW (ref 98–111)
Creatinine, Ser: 0.8 mg/dL (ref 0.44–1.00)
GFR, Estimated: >60 mL/min (ref >60)
Glucose, Bld: 124 mg/dL — ABNORMAL HIGH (ref 70–99)
Potassium: 2.9 mmol/L — ABNORMAL LOW (ref 3.5–5.1)
Sodium: 128 mmol/L — ABNORMAL LOW (ref 135–145)

## 2021-04-27 LAB — CBC
HCT: 37.7 % (ref 36.0–46.0)
Hemoglobin: 12.7 g/dL (ref 12.0–15.0)
MCH: 31.1 pg (ref 26.0–34.0)
MCHC: 33.7 g/dL (ref 30.0–36.0)
MCV: 92.4 fL (ref 80.0–100.0)
Platelets: 338 10*3/uL (ref 150–400)
RBC: 4.08 MIL/uL (ref 3.87–5.11)
RDW: 13.2 % (ref 11.5–15.5)
WBC: 7.1 10*3/uL (ref 4.0–10.5)
nRBC: 0 % (ref 0.0–0.2)

## 2021-04-27 LAB — RESP PANEL BY RT-PCR (FLU A&B, COVID) ARPGX2
Influenza A by PCR: NEGATIVE
Influenza B by PCR: NEGATIVE
SARS Coronavirus 2 by RT PCR: NEGATIVE

## 2021-04-27 NOTE — ED Triage Notes (Signed)
Pt presents to ER via ems from home c/o generalized body aches that started suddenly around 1 hr ago.  Pt states she had an episode like this recently and her BP dropped.  Pt A&O x4 at this time.  Pt in NAD at this time.

## 2021-05-05 ENCOUNTER — Telehealth: Payer: Self-pay | Admitting: Emergency Medicine

## 2021-05-05 NOTE — Telephone Encounter (Signed)
Called patient due to left emergency department before provider exam to inquire about condition and follow up plans. I left a message with my number to call me back

## 2021-05-17 ENCOUNTER — Other Ambulatory Visit: Payer: Self-pay

## 2021-05-17 ENCOUNTER — Emergency Department: Payer: Medicaid Other

## 2021-05-17 DIAGNOSIS — R569 Unspecified convulsions: Secondary | ICD-10-CM | POA: Insufficient documentation

## 2021-05-17 DIAGNOSIS — Y900 Blood alcohol level of less than 20 mg/100 ml: Secondary | ICD-10-CM | POA: Insufficient documentation

## 2021-05-17 DIAGNOSIS — M79602 Pain in left arm: Secondary | ICD-10-CM | POA: Insufficient documentation

## 2021-05-17 DIAGNOSIS — Z5321 Procedure and treatment not carried out due to patient leaving prior to being seen by health care provider: Secondary | ICD-10-CM | POA: Insufficient documentation

## 2021-05-17 DIAGNOSIS — R1110 Vomiting, unspecified: Secondary | ICD-10-CM | POA: Diagnosis not present

## 2021-05-17 LAB — COMPREHENSIVE METABOLIC PANEL
ALT: 18 U/L (ref 0–44)
AST: 19 U/L (ref 15–41)
Albumin: 3.7 g/dL (ref 3.5–5.0)
Alkaline Phosphatase: 58 U/L (ref 38–126)
Anion gap: 8 (ref 5–15)
BUN: 13 mg/dL (ref 8–23)
CO2: 27 mmol/L (ref 22–32)
Calcium: 9.1 mg/dL (ref 8.9–10.3)
Chloride: 99 mmol/L (ref 98–111)
Creatinine, Ser: 0.71 mg/dL (ref 0.44–1.00)
GFR, Estimated: >60 mL/min (ref >60)
Glucose, Bld: 107 mg/dL — ABNORMAL HIGH (ref 70–99)
Potassium: 3.3 mmol/L — ABNORMAL LOW (ref 3.5–5.1)
Sodium: 134 mmol/L — ABNORMAL LOW (ref 135–145)
Total Bilirubin: 0.5 mg/dL (ref 0.3–1.2)
Total Protein: 7.1 g/dL (ref 6.5–8.1)

## 2021-05-17 LAB — CBC
HCT: 36.6 % (ref 36.0–46.0)
Hemoglobin: 12.4 g/dL (ref 12.0–15.0)
MCH: 30.9 pg (ref 26.0–34.0)
MCHC: 33.9 g/dL (ref 30.0–36.0)
MCV: 91.3 fL (ref 80.0–100.0)
Platelets: 261 10*3/uL (ref 150–400)
RBC: 4.01 MIL/uL (ref 3.87–5.11)
RDW: 12.8 % (ref 11.5–15.5)
WBC: 6.2 10*3/uL (ref 4.0–10.5)
nRBC: 0 % (ref 0.0–0.2)

## 2021-05-17 LAB — TROPONIN I (HIGH SENSITIVITY): Troponin I (High Sensitivity): 3 ng/L (ref <18)

## 2021-05-17 LAB — ETHANOL: Alcohol, Ethyl (B): <10 mg/dL (ref <10)

## 2021-05-17 NOTE — ED Triage Notes (Signed)
Pt brought in by [facility] from home, family states she had a seizure. Started vomiting and was not responding per EMS report. Pt states she was having some left arm pain prior to event. No hx of seizures, EMS stated she was alert and awake when they arrived.

## 2021-05-18 ENCOUNTER — Ambulatory Visit (INDEPENDENT_AMBULATORY_CARE_PROVIDER_SITE_OTHER): Payer: Medicaid Other

## 2021-05-18 ENCOUNTER — Emergency Department
Admission: EM | Admit: 2021-05-18 | Discharge: 2021-05-18 | Disposition: A | Discharge status: Home / Self Care | Payer: Medicaid Other | Attending: Emergency Medicine | Admitting: Emergency Medicine

## 2021-05-18 DIAGNOSIS — R0602 Shortness of breath: Secondary | ICD-10-CM | POA: Diagnosis not present

## 2021-05-18 LAB — ECHOCARDIOGRAM COMPLETE
AR max vel: 2.88 cm2
AV Area VTI: 2.86 cm2
AV Area mean vel: 3.04 cm2
AV Mean grad: 4 mmHg
AV Peak grad: 7.8 mmHg
Ao pk vel: 1.4 m/s
Area-P 1/2: 2.78 cm2
Calc EF: 66.9 %
S' Lateral: 2.7 cm
Single Plane A2C EF: 74.6 %
Single Plane A4C EF: 59.4 %

## 2021-05-25 ENCOUNTER — Telehealth: Payer: Self-pay

## 2021-05-25 NOTE — Telephone Encounter (Signed)
-----   Message from Debbe Odea, MD sent at 05/19/2021  6:59 PM EST ----- Echo shows normal systolic function, normal filling pressures.  No gross structural abnormalities noted on echo.  Shortness of breath likely from COPD.

## 2021-05-25 NOTE — Telephone Encounter (Signed)
Left a VM for patient to call back so I can give her the result note as documented below:

## 2021-05-25 NOTE — Telephone Encounter (Signed)
The patient has been notified of the result and verbalized understanding.  All questions (if any) were answered. Austen Oyster  [place], RN 05/25/2021 1:16 PM

## 2021-05-25 NOTE — Telephone Encounter (Signed)
Patient is returning your call.  

## 2021-05-28 ENCOUNTER — Other Ambulatory Visit (INDEPENDENT_AMBULATORY_CARE_PROVIDER_SITE_OTHER): Payer: Self-pay | Admitting: Vascular Surgery

## 2021-05-28 DIAGNOSIS — I739 Peripheral vascular disease, unspecified: Secondary | ICD-10-CM

## 2021-05-31 ENCOUNTER — Encounter (INDEPENDENT_AMBULATORY_CARE_PROVIDER_SITE_OTHER): Payer: Self-pay | Admitting: Nurse Practitioner

## 2021-05-31 ENCOUNTER — Encounter (INDEPENDENT_AMBULATORY_CARE_PROVIDER_SITE_OTHER): Payer: Self-pay

## 2021-06-16 ENCOUNTER — Other Ambulatory Visit: Payer: Self-pay | Admitting: Neurology

## 2021-06-16 DIAGNOSIS — R110 Nausea: Secondary | ICD-10-CM

## 2021-06-16 DIAGNOSIS — H53149 Visual discomfort, unspecified: Secondary | ICD-10-CM

## 2021-06-16 DIAGNOSIS — R569 Unspecified convulsions: Secondary | ICD-10-CM

## 2021-06-16 DIAGNOSIS — R519 Headache, unspecified: Secondary | ICD-10-CM

## 2021-06-27 ENCOUNTER — Ambulatory Visit: Admission: RE | Admit: 2021-06-27 | Payer: Medicaid Other | Source: Ambulatory Visit

## 2021-06-28 ENCOUNTER — Ambulatory Visit: Payer: Medicaid Other | Admitting: Cardiology

## 2021-07-14 LAB — EXTERNAL GENERIC LAB PROCEDURE: COLOGUARD: NEGATIVE

## 2021-07-14 LAB — COLOGUARD: COLOGUARD: NEGATIVE

## 2021-07-16 ENCOUNTER — Institutional Professional Consult (permissible substitution): Payer: Medicaid Other | Admitting: Internal Medicine

## 2021-08-11 ENCOUNTER — Emergency Department: Payer: Medicaid Other

## 2021-08-11 ENCOUNTER — Other Ambulatory Visit: Payer: Self-pay

## 2021-08-11 DIAGNOSIS — Z79899 Other long term (current) drug therapy: Secondary | ICD-10-CM | POA: Diagnosis not present

## 2021-08-11 DIAGNOSIS — I10 Essential (primary) hypertension: Secondary | ICD-10-CM | POA: Diagnosis not present

## 2021-08-11 DIAGNOSIS — M545 Low back pain, unspecified: Secondary | ICD-10-CM | POA: Insufficient documentation

## 2021-08-11 DIAGNOSIS — J449 Chronic obstructive pulmonary disease, unspecified: Secondary | ICD-10-CM | POA: Insufficient documentation

## 2021-08-11 DIAGNOSIS — R0789 Other chest pain: Secondary | ICD-10-CM | POA: Diagnosis present

## 2021-08-11 DIAGNOSIS — R519 Headache, unspecified: Secondary | ICD-10-CM | POA: Diagnosis not present

## 2021-08-11 LAB — BASIC METABOLIC PANEL
Anion gap: 6 (ref 5–15)
BUN: 8 mg/dL (ref 8–23)
CO2: 24 mmol/L (ref 22–32)
Calcium: 8.5 mg/dL — ABNORMAL LOW (ref 8.9–10.3)
Chloride: 99 mmol/L (ref 98–111)
Creatinine, Ser: 0.67 mg/dL (ref 0.44–1.00)
GFR, Estimated: >60 mL/min (ref >60)
Glucose, Bld: 106 mg/dL — ABNORMAL HIGH (ref 70–99)
Potassium: 3.1 mmol/L — ABNORMAL LOW (ref 3.5–5.1)
Sodium: 129 mmol/L — ABNORMAL LOW (ref 135–145)

## 2021-08-11 LAB — CBC
HCT: 38 % (ref 36.0–46.0)
Hemoglobin: 12.8 g/dL (ref 12.0–15.0)
MCH: 30.1 pg (ref 26.0–34.0)
MCHC: 33.7 g/dL (ref 30.0–36.0)
MCV: 89.4 fL (ref 80.0–100.0)
Platelets: 338 10*3/uL (ref 150–400)
RBC: 4.25 MIL/uL (ref 3.87–5.11)
RDW: 12.7 % (ref 11.5–15.5)
WBC: 8.3 10*3/uL (ref 4.0–10.5)
nRBC: 0 % (ref 0.0–0.2)

## 2021-08-11 LAB — TROPONIN I (HIGH SENSITIVITY): Troponin I (High Sensitivity): 4 ng/L (ref <18)

## 2021-08-11 NOTE — ED Triage Notes (Signed)
Pt presents from home. Reports headache and HTN for the last few days per EMS report. Pt reports pressure type mid sternal chest pain while in route to ED. 4 ASA given and nitro x1 given by EMS.

## 2021-08-12 ENCOUNTER — Emergency Department: Payer: Medicaid Other

## 2021-08-12 ENCOUNTER — Emergency Department
Admission: EM | Admit: 2021-08-12 | Discharge: 2021-08-12 | Disposition: A | Discharge status: Home / Self Care | Payer: Medicaid Other | Attending: Emergency Medicine | Admitting: Emergency Medicine

## 2021-08-12 DIAGNOSIS — R079 Chest pain, unspecified: Secondary | ICD-10-CM

## 2021-08-12 DIAGNOSIS — M545 Low back pain, unspecified: Secondary | ICD-10-CM

## 2021-08-12 DIAGNOSIS — R519 Headache, unspecified: Secondary | ICD-10-CM

## 2021-08-12 LAB — URINALYSIS, COMPLETE (UACMP) WITH MICROSCOPIC
Bacteria, UA: NONE SEEN
Bilirubin Urine: NEGATIVE
Glucose, UA: NEGATIVE mg/dL
Ketones, ur: NEGATIVE mg/dL
Nitrite: NEGATIVE
Protein, ur: NEGATIVE mg/dL
Specific Gravity, Urine: 1.005 (ref 1.005–1.030)
pH: 7 (ref 5.0–8.0)

## 2021-08-12 LAB — TROPONIN I (HIGH SENSITIVITY): Troponin I (High Sensitivity): 3 ng/L (ref <18)

## 2021-08-12 MED ORDER — SODIUM CHLORIDE 0.9 % IV BOLUS (SEPSIS)
1000.0000 mL | Freq: Once | INTRAVENOUS | Status: AC
  Administered 2021-08-12: 1000 mL via INTRAVENOUS

## 2021-08-12 MED ORDER — METOCLOPRAMIDE HCL 5 MG/ML IJ SOLN
10.0000 mg | Freq: Once | INTRAMUSCULAR | Status: AC
  Administered 2021-08-12: 10 mg via INTRAVENOUS
  Filled 2021-08-12: qty 2

## 2021-08-12 MED ORDER — IOHEXOL 350 MG/ML SOLN
75.0000 mL | Freq: Once | INTRAVENOUS | Status: AC | PRN
  Administered 2021-08-12: 75 mL via INTRAVENOUS

## 2021-08-12 MED ORDER — DIPHENHYDRAMINE HCL 50 MG/ML IJ SOLN
50.0000 mg | Freq: Once | INTRAMUSCULAR | Status: AC
  Administered 2021-08-12: 50 mg via INTRAVENOUS
  Filled 2021-08-12: qty 1

## 2021-08-12 MED ORDER — HYDROMORPHONE HCL 1 MG/ML IJ SOLN
1.0000 mg | Freq: Once | INTRAMUSCULAR | Status: AC
  Administered 2021-08-12: 1 mg via INTRAVENOUS
  Filled 2021-08-12: qty 1

## 2021-08-12 MED ORDER — KETOROLAC TROMETHAMINE 30 MG/ML IJ SOLN
30.0000 mg | Freq: Once | INTRAMUSCULAR | Status: AC
  Administered 2021-08-12: 30 mg via INTRAVENOUS
  Filled 2021-08-12: qty 1

## 2021-08-12 NOTE — ED Notes (Signed)
Reassess after pain meds   Katelyn [name], Katelyn Maw, DO 08/12/21 [phone]

## 2021-08-12 NOTE — ED Provider Notes (Signed)
[facility] Provider Note    Event Date/Time   First MD Initiated Contact with Patient 08/12/21 [phone]     (approximate)   History   Chest Pain   HPI  Katelyn [name] is a 61 y.o. female with history of hypertension, hyperlipidemia, migraine headaches, COPD who presents to the emergency department with multiple complaints.  When asked why she is here she states "my blood pressure was high".  She states that she was having central chest tightness and shortness of breath but this is now resolved.  Was given 1 nitroglycerin with EMS.  States she is also having a right-sided headache that she reports started before nitroglycerin.  She denies any numbness, tingling or weakness.  She is complaining of lower back pain as well since being here in the ER.  She states that she is peeing more frequently and feels like she is not emptying her bladder fully but denies overflow incontinence, bowel incontinence.  States she takes metoprolol extended release 50 mg and amlodipine 10 mg in the morning.  No missed doses of medications.  Currently blood pressure is 144/80.  History provided by patient and husband.    Past Medical History:  Diagnosis Date   Anxiety    COPD (chronic obstructive pulmonary disease) (HCC)    Depressive disorder    GERD (gastroesophageal reflux disease)    Hiatal hernia    HLD (hyperlipidemia)    Hypertension    IBS (irritable bowel syndrome)    IC (interstitial cystitis)    Migraine    Nicotine dependence    Nontoxic goiter    Overactive bladder    Smoking     Past Surgical History:  Procedure Laterality Date   ABDOMINAL HYSTERECTOMY     ADENOIDECTOMY     CHOLECYSTECTOMY     HERNIA REPAIR     NECK SURGERY     SHOULDER SURGERY     TONSILLECTOMY      MEDICATIONS:  Prior to Admission medications   Medication Sig Start Date End Date Taking? Authorizing Provider  amLODipine (NORVASC) 10 MG tablet Take 1 tablet (10 mg total) by mouth  daily. 04/15/21   Kate Sable, MD  atorvastatin (LIPITOR) 40 MG tablet Take 40 mg by mouth daily. 01/01/15   [provider]  butalbital-acetaminophen-caffeine (FIORICET) 50-325-40 MG tablet Take 1 tablet by mouth daily as needed. 04/03/19   [provider]  cloNIDine (CATAPRES) 0.1 MG tablet Take 0.1 mg by mouth 2 (two) times daily. 04/29/20   [provider]  cyclobenzaprine (FLEXERIL) 5 MG tablet Take 1 tablet (5 mg total) by mouth 3 (three) times daily as needed for muscle spasms. 01/14/18   Hinda Kehr, MD  D3-50 1.25 MG (50000 UT) capsule Take 50,000 Units by mouth once a week. 08/18/20   [provider]  dicyclomine (BENTYL) 20 MG tablet Take 1 tablet (20 mg total) by mouth 3 (three) times daily as needed for spasms. 06/08/17   Carrie Mew, MD  esomeprazole (NEXIUM) 40 MG capsule Take 40 mg by mouth daily at 12 noon.    [provider]  famotidine (PEPCID) 40 MG tablet Take 40 mg by mouth at bedtime. 06/15/20   [provider]  FLOVENT HFA 220 MCG/ACT inhaler Inhale 2 puffs into the lungs 2 (two) times daily. 08/15/20   [provider]  gabapentin (NEURONTIN) 600 MG tablet Take 600 mg by mouth 3 (three) times daily. 04/07/20   [provider]  losartan-hydrochlorothiazide Konrad Penta)  100-25 MG tablet Take 1 tablet by mouth daily. 04/16/21   Kate Sable, MD  methocarbamol (ROBAXIN) 500 MG tablet Take 500 mg by mouth 3 (three) times daily. 08/01/20   [provider]  metoCLOPramide (REGLAN) 10 MG tablet Take 10 mg by mouth 3 (three) times daily as needed. 06/04/20   [provider]  PREMARIN vaginal cream Place 1 Applicatorful vaginally daily. 03/26/20   [provider]  PROAIR HFA 108 (90 Base) MCG/ACT inhaler Inhale 1-2 puffs into the lungs as needed. 03/20/20   [provider]  promethazine (PHENERGAN) 25 MG tablet Take 25 mg by mouth every 6 (six) hours as needed for nausea  or vomiting.    [provider]  SUMAtriptan (IMITREX) 100 MG tablet Take 1 tablet (100 mg total) by mouth every 2 (two) hours as needed for migraine. May repeat in 2 hours if headache persists or recurs. 01/27/14   Kinnie Feil, MD    Physical Exam   Triage Vital Signs: ED Triage Vitals [08/11/21 2252]  Enc Vitals Group     BP 108/79     Pulse Rate 90     Resp 20     Temp 98.3 F (36.8 C)     Temp Source Oral     SpO2 95 %     Weight      Height      Head Circumference      Peak Flow      Pain Score 6     Pain Loc      Pain Edu?      Excl. in [place]?     Most recent vital signs: Vitals:   08/12/21 0322 08/12/21 0330  BP: 130/79 138/84  Pulse: 77 74  Resp: 16   Temp: 98.2 F (36.8 C)   SpO2: 98% 99%    CONSTITUTIONAL: Alert and oriented and responds appropriately to questions. Well-appearing; well-nourished HEAD: Normocephalic, atraumatic EYES: Conjunctivae clear, pupils appear equal, sclera nonicteric ENT: normal nose; moist mucous membranes NECK: Supple, normal ROM CARD: RRR; S1 and S2 appreciated; no murmurs, no clicks, no rubs, no gallops RESP: Normal chest excursion without splinting or tachypnea; breath sounds clear and equal bilaterally; no wheezes, no rhonchi, no rales, no hypoxia or respiratory distress, speaking full sentences ABD/GI: Normal bowel sounds; non-distended; soft, non-tender, no rebound, no guarding, no peritoneal signs BACK: The back appears normal, no midline step-off or deformity EXT: Normal ROM in all joints; no deformity noted, no edema; no cyanosis, no calf tenderness or calf swelling SKIN: Normal color for age and race; warm; no rash on exposed skin NEURO: Moves all extremities equally, normal speech, sensation to light touch intact diffusely, cranial nerves II through XII intact PSYCH: The patient's mood and manner are appropriate.   ED Results / Procedures / Treatments   LABS: (all labs ordered are listed, but only  abnormal results are displayed) Labs Reviewed  BASIC METABOLIC PANEL - Abnormal; Notable for the following components:      Result Value   Sodium 129 (*)    Potassium 3.1 (*)    Glucose, Bld 106 (*)    Calcium 8.5 (*)    All other components within normal limits  URINALYSIS, COMPLETE (UACMP) WITH MICROSCOPIC - Abnormal; Notable for the following components:   Color, Urine YELLOW (*)    APPearance CLEAR (*)    Hgb urine dipstick MODERATE (*)    Leukocytes,Ua TRACE (*)    All other components within normal limits  CBC  TROPONIN I (HIGH SENSITIVITY)  TROPONIN I (HIGH SENSITIVITY)     EKG:  EKG Interpretation  Date/Time:  Wednesday August 11 2021 22:55:45 EST Ventricular Rate:  71 PR Interval:  154 QRS Duration: 82 QT Interval:  424 QTC Calculation: 460 R Axis:   25 Text Interpretation: Normal sinus rhythm Nonspecific T wave abnormality Abnormal ECG When compared with ECG of 17-May-2021 22:51, No significant change was found Confirmed by Pryor Curia [phone]) on 08/12/2021 2:07:42 AM         RADIOLOGY: My personal review and interpretation of imaging: Chest x-ray shows right-sided opacity that is chronic.  CTA of the chest obtained and shows no PE or other acute abnormality.  CT head unremarkable.  I have personally reviewed all radiology reports.   DG Chest 2 View  Result Date: 08/11/2021 CLINICAL DATA:  Chest pain EXAM: CHEST - 2 VIEW COMPARISON:  04/27/2021, 02/04/2020, 02/23/2021, 05/14/2017 FINDINGS: Mild asymmetric opacity in the right hilus persists. Both lungs are clear. Hardware in the cervical spine. Degenerative changes of the spine. Chronic deformity distal right clavicle. IMPRESSION: 1. No acute airspace disease 2. Mild asymmetric right inferior hilar opacity persists. Consider correlation with chest CT to exclude nodes or nodule. Electronically Signed   By: Donavan Foil M.D.   On: 08/11/2021 23:19   CT HEAD WO CONTRAST (5MM)  Result Date:  08/12/2021 CLINICAL DATA:  New onset headaches EXAM: CT HEAD WITHOUT CONTRAST TECHNIQUE: Contiguous axial images were obtained from the base of the skull through the vertex without intravenous contrast. RADIATION DOSE REDUCTION: This exam was performed according to the departmental dose-optimization program which includes automated exposure control, adjustment of the mA and/or kV according to patient size and/or use of iterative reconstruction technique. COMPARISON:  02/23/2021 FINDINGS: Brain: No evidence of acute infarction, hemorrhage, hydrocephalus, extra-axial collection or mass lesion/mass effect. Vascular: No hyperdense vessel or unexpected calcification. Skull: Normal. Negative for fracture or focal lesion. Sinuses/Orbits: No acute finding. Other: None. IMPRESSION: No acute intracranial abnormality noted. Electronically Signed   By: Inez Catalina M.D.   On: 08/12/2021 01:05   CT Angio Chest PE W and/or Wo Contrast  Result Date: 08/12/2021 CLINICAL DATA:  Chest pain EXAM: CT ANGIOGRAPHY CHEST WITH CONTRAST TECHNIQUE: Multidetector CT imaging of the chest was performed using the standard protocol during bolus administration of intravenous contrast. Multiplanar CT image reconstructions and MIPs were obtained to evaluate the vascular anatomy. RADIATION DOSE REDUCTION: This exam was performed according to the departmental dose-optimization program which includes automated exposure control, adjustment of the mA and/or kV according to patient size and/or use of iterative reconstruction technique. CONTRAST:  65mL OMNIPAQUE IOHEXOL 350 MG/ML SOLN COMPARISON:  10/09/2007 FINDINGS: Cardiovascular: Satisfactory opacification of the pulmonary arteries to the segmental level. No evidence of pulmonary embolism. Normal heart size. No pericardial effusion. Aortic atherosclerosis. Mediastinum/Nodes: Enlarged mediastinal and right hilar lymph nodes, with a precarinal lymph node measuring up to 1.4 cm in short axis (series  4, image 49) and a right hilar lymph node measuring up to 1.4 cm in short axis (series 4, image 78). The thyroid gland, trachea, and esophagus are unremarkable. Lungs/Pleura: Severe centrilobular and paraseptal emphysema. No focal pulmonary opacity or pleural effusion. Upper Abdomen: No acute abnormality. Musculoskeletal: Partially imaged ACDF. No acute osseous abnormality. Review of the MIP images confirms the above findings. IMPRESSION: 1. Negative for pulmonary embolism. 2. Enlarged mediastinal and right hilar lymph nodes measuring up to 1.4 cm. 3. Severe centrilobular and paraseptal emphysema. 4. Aortic  atherosclerosis. Electronically Signed   By: Merilyn Baba M.D.   On: 08/12/2021 03:19     PROCEDURES:  Critical Care performed: No   CRITICAL CARE Performed by: Cyril Mourning Synia Douglass   Total critical care time: 0 minutes  Critical care time was exclusive of separately billable procedures and treating other patients.  Critical care was necessary to treat or prevent imminent or life-threatening deterioration.  Critical care was time spent personally by me on the following activities: development of treatment plan with patient and/or surrogate as well as nursing, discussions with consultants, evaluation of patient's response to treatment, examination of patient, obtaining history from patient or surrogate, ordering and performing treatments and interventions, ordering and review of laboratory studies, ordering and review of radiographic studies, pulse oximetry and re-evaluation of patient's condition.   Marland Kitchen1-3 Lead EKG Interpretation Performed by: Gwynne Kemnitz, Delice Bison, DO Authorized by: Duwan Adrian, Delice Bison, DO     Interpretation: normal     ECG rate:  65   ECG rate assessment: normal     Rhythm: sinus rhythm     Ectopy: none     Conduction: normal      IMPRESSION / MDM / ASSESSMENT AND PLAN / ED COURSE  I reviewed the triage vital signs and the nursing notes.    Patient here with multiple  complaints.  Complaining of chest pain, shortness of breath, right-sided headache, lower back pain, urinary symptoms.  The patient is on the cardiac monitor to evaluate for evidence of arrhythmia and/or significant heart rate changes.   DIFFERENTIAL DIAGNOSIS (includes but not limited to):   ACS, PE, dissection, hypertensive urgency/emergency, CHF, migraine, intracranial hemorrhage, CVA, CVT, tension headache, musculoskeletal back pain, UTI, kidney stone, pyelonephritis.  Doubt cauda equina, epidural abscess or hematoma, discitis or osteomyelitis, transverse myelitis.  She is concerned that her blood pressure is elevated but is currently in the 140s/80s.   PLAN: We will obtain CBC, BMP, troponin x2, chest x-ray, head CT, urinalysis.  Will give IV fluids, Toradol, Reglan, Benadryl.   MEDICATIONS GIVEN IN ED: Medications  ketorolac (TORADOL) 30 MG/ML injection 30 mg (30 mg Intravenous Given 08/12/21 0129)  metoCLOPramide (REGLAN) injection 10 mg (10 mg Intravenous Given 08/12/21 0127)  sodium chloride 0.9 % bolus 1,000 mL (0 mLs Intravenous Stopped 08/12/21 0357)  diphenhydrAMINE (BENADRYL) injection 50 mg (50 mg Intravenous Given 08/12/21 0136)  iohexol (OMNIPAQUE) 350 MG/ML injection 75 mL (75 mLs Intravenous Contrast Given 08/12/21 0257)  HYDROmorphone (DILAUDID) injection 1 mg (1 mg Intravenous Given 08/12/21 0321)     ED COURSE: Patient's labs reassuring.  No leukocytosis.  Normal hemoglobin.  Normal electrolytes and renal function.  Troponin x2 negative.  Patient's chest x-ray reviewed by myself and radiology and shows right-sided opacity which has been seen on previous imaging but she has never had a CT of her chest.  We will perform a CTA to rule out mass, infiltrate, infarction, PE.  CT of the head reviewed by myself and radiologist and shows no acute intracranial abnormality.   CTA shows some lymphadenopathy but no masses, nodules, PE, infiltrate.  Urine shows no sign of infection.  Given  Dilaudid for further pain control and patient states she is now feeling better.  Blood pressures currently 130s/80s.  Have recommended Tylenol, Motrin over-the-counter for continued pain control and close follow-up with her primary care provider.  It does appear that she receives narcotic pain medication pretty regularly from different providers and therefore I do not feel comfortable giving her another prescription for  narcotic pain medicine from the ED.  I feel she is safe for discharge home.   At this time, I do not feel there is any life-threatening condition present. I reviewed all nursing notes, vitals, pertinent previous records.  All lab and urine results, EKGs, imaging ordered have been independently reviewed and interpreted by myself.  I reviewed all available radiology reports from any imaging ordered this visit.  Based on my assessment, I feel the patient is safe to be discharged home without further emergent workup and can continue workup as an outpatient as needed. Discussed all findings, treatment plan as well as usual and customary return precautions with patient and husband.  They verbalize understanding and are comfortable with this plan.  Outpatient follow-up has been provided as needed.  All questions have been answered.    CONSULTS: Admission was considered but given patient has history of hypertension and hyperlipidemia but patient has had a reassuring work-up with negative troponin x2, CTA that shows no PE or dissection and chest pain that has resolved with normal blood pressures, I feel it is reasonable for her to follow-up with her primary care provider as an outpatient.   OUTSIDE RECORDS REVIEWED: Reviewed patient's last neurology note with Dr. Gurney Maxin on 06/03/2021.  It appears patient was started on Topamax for headaches and given a Medrol Dosepak and told to continue Fioricet as needed and had her nortriptyline decreased at that time.         FINAL CLINICAL  IMPRESSION(S) / ED DIAGNOSES   Final diagnoses:  Nonspecific chest pain  Right-sided headache  Acute midline low back pain without sciatica     Rx / DC Orders   ED Discharge Orders     None        Note:  This document was prepared using Dragon voice recognition software and may include unintentional dictation errors.   Yousif Edelson, Delice Bison, DO 08/12/21 [phone]

## 2021-08-12 NOTE — ED Notes (Signed)
Patient transported to CT via stretcher with CT tech, and returned to room ED room via stretcher with CT tech

## 2021-08-12 NOTE — Discharge Instructions (Addendum)
You may alternate Tylenol 1000 mg every 6 hours as needed for pain, fever and Ibuprofen 800 mg every 6-8 hours as needed for pain, fever.  Please take Ibuprofen with food.  Do not take more than 4000 mg of Tylenol (acetaminophen) in a 24 hour period. ° °

## 2021-08-16 DIAGNOSIS — R569 Unspecified convulsions: Secondary | ICD-10-CM | POA: Insufficient documentation

## 2021-08-25 ENCOUNTER — Telehealth: Payer: Self-pay | Admitting: Nurse Practitioner

## 2021-08-25 NOTE — Telephone Encounter (Signed)
Patient following up on Cologuard results. ?

## 2021-08-25 NOTE — Telephone Encounter (Signed)
Patient reports the Cologuard test was mailed back in January 2023. Called the company. The test results are negative and the report will be faxed to [place].  ?Patient advised of the negative results. Understands she will be contacted with the recommendations for repeating colon cancer screening after the physician reviews her test results and her personal health history/risks.  ?No further questions. ?

## 2021-11-29 ENCOUNTER — Encounter (INDEPENDENT_AMBULATORY_CARE_PROVIDER_SITE_OTHER): Payer: Medicaid Other

## 2021-11-29 ENCOUNTER — Encounter (INDEPENDENT_AMBULATORY_CARE_PROVIDER_SITE_OTHER): Payer: Medicaid Other | Admitting: Vascular Surgery

## 2022-03-03 ENCOUNTER — Emergency Department
Admission: EM | Admit: 2022-03-03 | Discharge: 2022-03-03 | Disposition: A | Discharge status: Home / Self Care | Payer: Medicaid Other | Attending: Emergency Medicine | Admitting: Emergency Medicine

## 2022-03-03 ENCOUNTER — Emergency Department: Payer: Medicaid Other

## 2022-03-03 ENCOUNTER — Other Ambulatory Visit: Payer: Self-pay

## 2022-03-03 DIAGNOSIS — R1013 Epigastric pain: Secondary | ICD-10-CM | POA: Insufficient documentation

## 2022-03-03 DIAGNOSIS — R110 Nausea: Secondary | ICD-10-CM | POA: Diagnosis not present

## 2022-03-03 DIAGNOSIS — R42 Dizziness and giddiness: Secondary | ICD-10-CM | POA: Insufficient documentation

## 2022-03-03 DIAGNOSIS — R519 Headache, unspecified: Secondary | ICD-10-CM | POA: Diagnosis not present

## 2022-03-03 DIAGNOSIS — Z20822 Contact with and (suspected) exposure to covid-19: Secondary | ICD-10-CM | POA: Diagnosis not present

## 2022-03-03 DIAGNOSIS — R1084 Generalized abdominal pain: Secondary | ICD-10-CM | POA: Diagnosis present

## 2022-03-03 DIAGNOSIS — I10 Essential (primary) hypertension: Secondary | ICD-10-CM | POA: Insufficient documentation

## 2022-03-03 DIAGNOSIS — R1032 Left lower quadrant pain: Secondary | ICD-10-CM | POA: Diagnosis not present

## 2022-03-03 LAB — CBC
HCT: 42.3 % (ref 36.0–46.0)
Hemoglobin: 14.3 g/dL (ref 12.0–15.0)
MCH: 30.2 pg (ref 26.0–34.0)
MCHC: 33.8 g/dL (ref 30.0–36.0)
MCV: 89.2 fL (ref 80.0–100.0)
Platelets: 229 10*3/uL (ref 150–400)
RBC: 4.74 MIL/uL (ref 3.87–5.11)
RDW: 12 % (ref 11.5–15.5)
WBC: 4.9 10*3/uL (ref 4.0–10.5)
nRBC: 0 % (ref 0.0–0.2)

## 2022-03-03 LAB — COMPREHENSIVE METABOLIC PANEL
ALT: 25 U/L (ref 0–44)
AST: 22 U/L (ref 15–41)
Albumin: 3.5 g/dL (ref 3.5–5.0)
Alkaline Phosphatase: 81 U/L (ref 38–126)
Anion gap: 9 (ref 5–15)
BUN: 7 mg/dL — ABNORMAL LOW (ref 8–23)
CO2: 28 mmol/L (ref 22–32)
Calcium: 8.8 mg/dL — ABNORMAL LOW (ref 8.9–10.3)
Chloride: 95 mmol/L — ABNORMAL LOW (ref 98–111)
Creatinine, Ser: 0.61 mg/dL (ref 0.44–1.00)
GFR, Estimated: >60 mL/min (ref >60)
Glucose, Bld: 99 mg/dL (ref 70–99)
Potassium: 3.9 mmol/L (ref 3.5–5.1)
Sodium: 132 mmol/L — ABNORMAL LOW (ref 135–145)
Total Bilirubin: 0.4 mg/dL (ref 0.3–1.2)
Total Protein: 7.1 g/dL (ref 6.5–8.1)

## 2022-03-03 LAB — URINALYSIS, ROUTINE W REFLEX MICROSCOPIC
Bilirubin Urine: NEGATIVE
Glucose, UA: NEGATIVE mg/dL
Ketones, ur: NEGATIVE mg/dL
Leukocytes,Ua: NEGATIVE
Nitrite: NEGATIVE
Protein, ur: NEGATIVE mg/dL
Specific Gravity, Urine: 1.006 (ref 1.005–1.030)
pH: 7 (ref 5.0–8.0)

## 2022-03-03 LAB — RESP PANEL BY RT-PCR (FLU A&B, COVID) ARPGX2
Influenza A by PCR: NEGATIVE
Influenza B by PCR: NEGATIVE
SARS Coronavirus 2 by RT PCR: NEGATIVE

## 2022-03-03 LAB — LIPASE, BLOOD: Lipase: 33 U/L (ref 11–51)

## 2022-03-03 MED ORDER — DICYCLOMINE HCL 10 MG PO CAPS: 10.0000 mg | ORAL_CAPSULE | Freq: Three times a day (TID) | ORAL | 0 refills | Status: DC | PRN

## 2022-03-03 MED ORDER — IOHEXOL 300 MG/ML  SOLN
100.0000 mL | Freq: Once | INTRAMUSCULAR | Status: AC | PRN
  Administered 2022-03-03: 100 mL via INTRAVENOUS

## 2022-03-03 NOTE — ED Triage Notes (Signed)
Pt coming POV today with L sided abdominal pain, Nausea and dizziness, and headaches. PT endorsing chills as well. Denies fever or body aches starting a week ago.

## 2022-03-03 NOTE — ED Notes (Signed)
Pt A&O, IV removed, pt given discharge instructions, pt ambulating with steady gait. 

## 2022-03-03 NOTE — ED Notes (Signed)
Urine sent to lab 

## 2022-03-03 NOTE — ED Provider Notes (Signed)
-----------------------------------------   5:31 PM on 03/03/2022 ----------------------------------------- CT scan does not show any concerning intra-abdominal abnormality.  Patient denies any current abdominal discomfort but states that it comes and goes.  Denies any back pain at any point.  CT scan did show possible discitis however does not appear to be so clinically.  Normal labs and normal CT.  We will discharge the patient with Bentyl and PCP follow-up.  Patient agreeable to plan.   Minna Antis, MD 03/03/22 1733

## 2022-03-03 NOTE — ED Provider Notes (Signed)
[facility] Provider Note   Event Date/Time   First MD Initiated Contact with Patient 03/03/22 1353     (approximate) History  Abdominal Pain  HPI PSALMS Katelyn [name] is a 61 y.o. female with a stated past medical history of hypertension, hyperlipidemia, and chronic GERD who presents for left-sided abdominal pain that is been worsening over the last week and is associated with nausea, dizziness, and headaches.  Patient describes a generalized abdominal pain that is worse over the left side and is 10/10 in severity as well as nonradiating.  Patient denies any exacerbating or relieving factors for this pain.  Patient also endorses persistent midepigastric pain that is somewhat relieved with food ROS: Patient currently denies any vision changes, tinnitus, difficulty speaking, facial droop, sore throat, chest pain, shortness of breath, vomiting/diarrhea, dysuria, or weakness/numbness/paresthesias in any extremity   Physical Exam  Triage Vital Signs: ED Triage Vitals  Enc Vitals Group     BP 03/03/22 1251 (!) 187/119     Pulse Rate 03/03/22 1251 81     Resp 03/03/22 1251 18     Temp 03/03/22 1251 97.8 F (36.6 C)     Temp Source 03/03/22 1251 Oral     SpO2 03/03/22 1251 99 %     Weight 03/03/22 1252 140 lb (63.5 kg)     Height --      Head Circumference --      Peak Flow --      Pain Score 03/03/22 1252 10     Pain Loc --      Pain Edu? --      Excl. in [place]? --    Most recent vital signs: Vitals:   03/03/22 1251 03/03/22 1416  BP: (!) 187/119 (!) 173/94  Pulse: 81 76  Resp: 18 18  Temp: 97.8 F (36.6 C) 98 F (36.7 C)  SpO2: 99% 100%   General: Awake, oriented x4.  Smells of cigarettes CV:  Good peripheral perfusion.  Resp:  Normal effort.  Abd:  No distention.  Mild tenderness to palpation over the left lower quadrant Other:  Middle-aged Caucasian female laying in bed in no acute distress ED Results / Procedures / Treatments  Labs (all labs ordered  are listed, but only abnormal results are displayed) Labs Reviewed  COMPREHENSIVE METABOLIC PANEL - Abnormal; Notable for the following components:      Result Value   Sodium 132 (*)    Chloride 95 (*)    BUN 7 (*)    Calcium 8.8 (*)    All other components within normal limits  URINALYSIS, ROUTINE W REFLEX MICROSCOPIC - Abnormal; Notable for the following components:   Color, Urine YELLOW (*)    APPearance CLEAR (*)    Hgb urine dipstick MODERATE (*)    Bacteria, UA RARE (*)    All other components within normal limits  RESP PANEL BY RT-PCR (FLU A&B, COVID) ARPGX2  LIPASE, BLOOD  CBC   EKG ED ECG REPORT I, Merwyn Katos, the attending physician, personally viewed and interpreted this ECG. Date: 03/03/2022 EKG Time: 1413 Rate: 71 Rhythm: normal sinus rhythm QRS Axis: normal Intervals: normal ST/T Wave abnormalities: normal Narrative Interpretation: no evidence of acute ischemia RADIOLOGY ED MD interpretation: Pending -Agree with radiology assessment Official radiology report(s): No results found. PROCEDURES: Critical Care performed: No .1-3 Lead EKG Interpretation  Performed by: Merwyn Katos, MD Authorized by: Merwyn Katos, MD     Interpretation: normal  ECG rate:  74   ECG rate assessment: normal     Rhythm: sinus rhythm     Ectopy: none     Conduction: normal    MEDICATIONS ORDERED IN ED: Medications - No data to display IMPRESSION / MDM / ASSESSMENT AND PLAN / ED COURSE  I reviewed the triage vital signs and the nursing notes.                             Differential diagnosis includes, but is not limited to, infectious colitis, diverticulitis, splenic infarct The patient is on the cardiac monitor to evaluate for evidence of arrhythmia and/or significant heart rate changes. Patient's presentation is most consistent with acute presentation with potential threat to life or bodily function. Patients symptoms not typical for emergent causes of  abdominal pain such as, but not limited to, appendicitis, abdominal aortic aneurysm, surgical biliary disease, pancreatitis, SBO, mesenteric ischemia, serious intra-abdominal bacterial illness. Presentation also not typical of gynecologic emergencies such as TOA, Ovarian Torsion, PID. Not Ectopic. Doubt atypical ACS. Patient pending CT of the abdomen and pelvis Care of this patient will be signed out to the oncoming physician at the end of my shift.  All pertinent patient information conveyed and all questions answered.  All further care and disposition decisions will be made by the oncoming physician.   FINAL CLINICAL IMPRESSION(S) / ED DIAGNOSES   Final diagnoses:  Generalized abdominal pain  Nausea   Rx / DC Orders   ED Discharge Orders     None      Note:  This document was prepared using Dragon voice recognition software and may include unintentional dictation errors.   Merwyn Katos, MD 03/03/22 1530

## 2022-04-07 ENCOUNTER — Ambulatory Visit: Payer: Medicaid Other | Admitting: Urology

## 2022-04-18 ENCOUNTER — Encounter: Payer: Self-pay | Admitting: Emergency Medicine

## 2022-04-18 ENCOUNTER — Other Ambulatory Visit: Payer: Self-pay

## 2022-04-18 ENCOUNTER — Emergency Department
Admission: EM | Admit: 2022-04-18 | Discharge: 2022-04-18 | Disposition: A | Discharge status: Home / Self Care | Payer: Medicaid Other | Attending: Emergency Medicine | Admitting: Emergency Medicine

## 2022-04-18 ENCOUNTER — Encounter (INDEPENDENT_AMBULATORY_CARE_PROVIDER_SITE_OTHER): Payer: Self-pay

## 2022-04-18 ENCOUNTER — Emergency Department: Payer: Medicaid Other

## 2022-04-18 DIAGNOSIS — I10 Essential (primary) hypertension: Secondary | ICD-10-CM | POA: Diagnosis not present

## 2022-04-18 DIAGNOSIS — N3001 Acute cystitis with hematuria: Secondary | ICD-10-CM | POA: Diagnosis not present

## 2022-04-18 DIAGNOSIS — R350 Frequency of micturition: Secondary | ICD-10-CM | POA: Diagnosis present

## 2022-04-18 LAB — CBC WITH DIFFERENTIAL/PLATELET
Abs Immature Granulocytes: 0.02 10*3/uL (ref 0.00–0.07)
Basophils Absolute: 0 10*3/uL (ref 0.0–0.1)
Basophils Relative: 1 %
Eosinophils Absolute: 0 10*3/uL (ref 0.0–0.5)
Eosinophils Relative: 1 %
HCT: 38.5 % (ref 36.0–46.0)
Hemoglobin: 13.5 g/dL (ref 12.0–15.0)
Immature Granulocytes: 0 %
Lymphocytes Relative: 27 %
Lymphs Abs: 1.6 10*3/uL (ref 0.7–4.0)
MCH: 30.9 pg (ref 26.0–34.0)
MCHC: 35.1 g/dL (ref 30.0–36.0)
MCV: 88.1 fL (ref 80.0–100.0)
Monocytes Absolute: 0.5 10*3/uL (ref 0.1–1.0)
Monocytes Relative: 9 %
Neutro Abs: 3.7 10*3/uL (ref 1.7–7.7)
Neutrophils Relative %: 62 %
Platelets: 246 10*3/uL (ref 150–400)
RBC: 4.37 MIL/uL (ref 3.87–5.11)
RDW: 11.9 % (ref 11.5–15.5)
WBC: 5.9 10*3/uL (ref 4.0–10.5)
nRBC: 0 % (ref 0.0–0.2)

## 2022-04-18 LAB — URINALYSIS, ROUTINE W REFLEX MICROSCOPIC
Bacteria, UA: NONE SEEN
Bilirubin Urine: NEGATIVE
Glucose, UA: NEGATIVE mg/dL
Ketones, ur: NEGATIVE mg/dL
Leukocytes,Ua: NEGATIVE
Nitrite: NEGATIVE
Protein, ur: NEGATIVE mg/dL
Specific Gravity, Urine: 1.002 — ABNORMAL LOW (ref 1.005–1.030)
pH: 7 (ref 5.0–8.0)

## 2022-04-18 LAB — BASIC METABOLIC PANEL
Anion gap: 8 (ref 5–15)
BUN: <5 mg/dL — ABNORMAL LOW (ref 8–23)
CO2: 26 mmol/L (ref 22–32)
Calcium: 8.9 mg/dL (ref 8.9–10.3)
Chloride: 91 mmol/L — ABNORMAL LOW (ref 98–111)
Creatinine, Ser: 0.51 mg/dL (ref 0.44–1.00)
GFR, Estimated: >60 mL/min (ref >60)
Glucose, Bld: 97 mg/dL (ref 70–99)
Potassium: 4.1 mmol/L (ref 3.5–5.1)
Sodium: 125 mmol/L — ABNORMAL LOW (ref 135–145)

## 2022-04-18 MED ORDER — SODIUM CHLORIDE 0.9 % IV BOLUS
1000.0000 mL | Freq: Once | INTRAVENOUS | Status: AC
  Administered 2022-04-18: 1000 mL via INTRAVENOUS

## 2022-04-18 MED ORDER — KETOROLAC TROMETHAMINE 15 MG/ML IJ SOLN
15.0000 mg | Freq: Once | INTRAMUSCULAR | Status: AC
  Administered 2022-04-18: 15 mg via INTRAVENOUS
  Filled 2022-04-18: qty 1

## 2022-04-18 MED ORDER — IOHEXOL 300 MG/ML  SOLN
100.0000 mL | Freq: Once | INTRAMUSCULAR | Status: AC | PRN
  Administered 2022-04-18: 100 mL via INTRAVENOUS

## 2022-04-18 MED ORDER — CEFPODOXIME PROXETIL 100 MG PO TABS: 100.0000 mg | ORAL_TABLET | Freq: Two times a day (BID) | ORAL | 0 refills | Status: AC

## 2022-04-18 MED ORDER — CEFTRIAXONE SODIUM 1 G IJ SOLR
1.0000 g | Freq: Once | INTRAMUSCULAR | Status: AC
  Administered 2022-04-18: 1 g via INTRAMUSCULAR
  Filled 2022-04-18: qty 10

## 2022-04-18 MED ORDER — MORPHINE SULFATE (PF) 2 MG/ML IV SOLN
2.0000 mg | Freq: Once | INTRAVENOUS | Status: AC
  Administered 2022-04-18: 2 mg via INTRAVENOUS
  Filled 2022-04-18: qty 1

## 2022-04-18 MED ORDER — SODIUM CHLORIDE 0.9 % IV SOLN: 1.0000 g | INTRAVENOUS | Status: DC

## 2022-04-18 NOTE — ED Triage Notes (Signed)
Pt sts that she has been having frequency with painful urination and pressure. Pt sts that she has been having pain in the groin.

## 2022-04-18 NOTE — ED Triage Notes (Signed)
First nurse note: Patient arrived by [facility] from home. C/o lower back pain groin pain for days. Reports trouble and pain while urinating.   A&O x4 per EMS  141/86 b/p 80P 99% RA [id number]

## 2022-04-18 NOTE — ED Provider Notes (Signed)
[facility] Provider Note    Event Date/Time   First MD Initiated Contact with Patient 04/18/22 1754     (approximate)   History   Urinary Frequency   HPI  Katelyn [name] is a 61 y.o. female who presents for evaluation of suprapubic abdominal pain, bilateral lower quadrant pain, and bilateral flank pain for the past 3 to 4 days.  She reports that she also has had nausea without vomiting.  She reports that she has chills and has not had a fever.  She has burning with urination.  She reports that she has had multiple urinary tract infections in the past, and this feels the same.  No history of kidney stones.  She reports that she has been taking an antibiotic that she had left over, but her symptoms or not improving.  She denies vaginal discharge or bleeding.  Patient Active Problem List   Diagnosis Date Noted   GERD (gastroesophageal reflux disease) 05/26/2020   Dysphagia 05/26/2020   Colon cancer screening 05/26/2020   Abdominal bloating 05/26/2020   Urinary retention 07/17/2017   Chest pain 01/27/2014   HTN (hypertension) 01/27/2014   HLD (hyperlipidemia) 01/27/2014          Physical Exam   Triage Vital Signs: ED Triage Vitals  Enc Vitals Group     BP 04/18/22 1639 (!) 167/92     Pulse Rate 04/18/22 1639 77     Resp 04/18/22 1639 19     Temp 04/18/22 1639 97.8 F (36.6 C)     Temp Source 04/18/22 1639 Oral     SpO2 04/18/22 1639 99 %     Weight 04/18/22 1631 150 lb (68 kg)     Height --      Head Circumference --      Peak Flow --      Pain Score 04/18/22 1630 10     Pain Loc --      Pain Edu? --      Excl. in [place]? --     Most recent vital signs: Vitals:   04/18/22 1639 04/18/22 2056  BP: (!) 167/92 (!) 164/84  Pulse: 77 80  Resp: 19 17  Temp: 97.8 F (36.6 C) 98 F (36.7 C)  SpO2: 99% 100%    Physical Exam Vitals and nursing note reviewed.  Constitutional:      General: Awake and alert. No acute distress.     Appearance: Normal appearance. The patient is normal weight.  HENT:     Head: Normocephalic and atraumatic.     Mouth: Mucous membranes are moist.  Eyes:     General: PERRL. Normal EOMs        Right eye: No discharge.        Left eye: No discharge.     Conjunctiva/sclera: Conjunctivae normal.  Cardiovascular:     Rate and Rhythm: Normal rate and regular rhythm.     Pulses: Normal pulses.     Heart sounds: Normal heart sounds Pulmonary:     Effort: Pulmonary effort is normal. No respiratory distress.     Breath sounds: Normal breath sounds.  Abdominal:     Abdomen is soft. There is suprapubic, left lower quadrant, right lower quadrant abdominal tenderness. No rebound or guarding. No distention.  Bilateral CVA tenderness Musculoskeletal:        General: No swelling. Normal range of motion.     Cervical back: Normal range of motion and neck supple.  Skin:  General: Skin is warm and dry.     Capillary Refill: Capillary refill takes less than 2 seconds.     Findings: No rash.  Neurological:     Mental Status: The patient is awake and alert.      ED Results / Procedures / Treatments   Labs (all labs ordered are listed, but only abnormal results are displayed) Labs Reviewed  URINALYSIS, ROUTINE W REFLEX MICROSCOPIC - Abnormal; Notable for the following components:      Result Value   Color, Urine STRAW (*)    APPearance CLEAR (*)    Specific Gravity, Urine 1.002 (*)    Hgb urine dipstick MODERATE (*)    All other components within normal limits  BASIC METABOLIC PANEL - Abnormal; Notable for the following components:   Sodium 125 (*)    Chloride 91 (*)    BUN <5 (*)    All other components within normal limits  URINE CULTURE  CBC WITH DIFFERENTIAL/PLATELET     EKG     RADIOLOGY I independently reviewed and interpreted imaging and agree with radiologists findings.     PROCEDURES:  Critical Care performed:   Procedures   MEDICATIONS ORDERED IN  ED: Medications  sodium chloride 0.9 % bolus 1,000 mL (0 mLs Intravenous Stopped 04/18/22 2006)  iohexol (OMNIPAQUE) 300 MG/ML solution 100 mL (100 mLs Intravenous Contrast Given 04/18/22 1909)  ketorolac (TORADOL) 15 MG/ML injection 15 mg (15 mg Intravenous Given 04/18/22 1945)  morphine (PF) 2 MG/ML injection 2 mg (2 mg Intravenous Given 04/18/22 2012)  cefTRIAXone (ROCEPHIN) injection 1 g (1 g Intramuscular Given 04/18/22 2050)     IMPRESSION / MDM / ASSESSMENT AND PLAN / ED COURSE  I reviewed the triage vital signs and the nursing notes.   Differential diagnosis includes, but is not limited to, urinary tract infection, pyelonephritis, nephrolithiasis, infected stone.  Patient is awake and alert, hemodynamically stable and afebrile.  She is tender diffusely across her low abdomen and her urinalysis reveals blood only, no leukocytes, nitrites, or bacteria.  Patient does admit to being on antibiotics, perhaps UA is masked by this.  Urine culture was added.  She agreed to blood work and imaging.  Labs are overall reassuring.  CT scan does not reveal evidence of pyelonephritis or infected stone.  Patient will be treated as a simple cystitis giving her urinary burning and frequency.  She reports that she has had many UTIs and this feels the same.  She denies any vaginal discharge to suggest STD.  She requested analgesia which was provided.  She was advised not to take her sleep time medications for at least 6 hours.  We discussed return precautions and the importance of close outpatient follow-up.  Patient understands and agrees with plan.  She was discharged in stable condition.   Patient's presentation is most consistent with acute complicated illness / injury requiring diagnostic workup.      FINAL CLINICAL IMPRESSION(S) / ED DIAGNOSES   Final diagnoses:  Acute cystitis with hematuria     Rx / DC Orders   ED Discharge Orders          Ordered    cefpodoxime (VANTIN) 100 MG  tablet  2 times daily        04/18/22 2019             Note:  This document was prepared using Dragon voice recognition software and may include unintentional dictation errors.   Keturah Shavers 04/18/22 2113  Rada Hay, MD 04/18/22 [phone]

## 2022-04-18 NOTE — Discharge Instructions (Signed)
Take antibiotics as prescribed.  Please return for any new, worsening, or change in symptoms or other concerns. 

## 2022-04-19 ENCOUNTER — Telehealth: Payer: Self-pay | Admitting: Emergency Medicine

## 2022-04-19 MED ORDER — CEPHALEXIN 500 MG PO CAPS: 500.0000 mg | ORAL_CAPSULE | Freq: Two times a day (BID) | ORAL | 0 refills | Status: DC

## 2022-04-19 NOTE — Telephone Encounter (Signed)
Patient's insurance did not cover her initial antibiotic.  Change antibiotic to Keflex 500 mg twice daily x7 days.

## 2022-04-20 ENCOUNTER — Encounter (HOSPITAL_BASED_OUTPATIENT_CLINIC_OR_DEPARTMENT_OTHER): Payer: Self-pay

## 2022-04-20 ENCOUNTER — Other Ambulatory Visit: Payer: Self-pay

## 2022-04-20 ENCOUNTER — Emergency Department (HOSPITAL_BASED_OUTPATIENT_CLINIC_OR_DEPARTMENT_OTHER): Payer: Medicaid Other

## 2022-04-20 ENCOUNTER — Emergency Department (HOSPITAL_BASED_OUTPATIENT_CLINIC_OR_DEPARTMENT_OTHER)
Admission: EM | Admit: 2022-04-20 | Discharge: 2022-04-20 | Disposition: A | Discharge status: Home / Self Care | Payer: Medicaid Other | Attending: Emergency Medicine | Admitting: Emergency Medicine

## 2022-04-20 DIAGNOSIS — J449 Chronic obstructive pulmonary disease, unspecified: Secondary | ICD-10-CM | POA: Insufficient documentation

## 2022-04-20 DIAGNOSIS — F17210 Nicotine dependence, cigarettes, uncomplicated: Secondary | ICD-10-CM | POA: Insufficient documentation

## 2022-04-20 DIAGNOSIS — N3289 Other specified disorders of bladder: Secondary | ICD-10-CM | POA: Insufficient documentation

## 2022-04-20 DIAGNOSIS — I10 Essential (primary) hypertension: Secondary | ICD-10-CM | POA: Insufficient documentation

## 2022-04-20 DIAGNOSIS — E871 Hypo-osmolality and hyponatremia: Secondary | ICD-10-CM | POA: Insufficient documentation

## 2022-04-20 DIAGNOSIS — Z79899 Other long term (current) drug therapy: Secondary | ICD-10-CM | POA: Insufficient documentation

## 2022-04-20 DIAGNOSIS — R1030 Lower abdominal pain, unspecified: Secondary | ICD-10-CM | POA: Diagnosis present

## 2022-04-20 LAB — COMPREHENSIVE METABOLIC PANEL
ALT: 33 U/L (ref 0–44)
AST: 30 U/L (ref 15–41)
Albumin: 4 g/dL (ref 3.5–5.0)
Alkaline Phosphatase: 66 U/L (ref 38–126)
Anion gap: 4 — ABNORMAL LOW (ref 5–15)
BUN: <5 mg/dL — ABNORMAL LOW (ref 8–23)
CO2: 28 mmol/L (ref 22–32)
Calcium: 9 mg/dL (ref 8.9–10.3)
Chloride: 94 mmol/L — ABNORMAL LOW (ref 98–111)
Creatinine, Ser: 0.61 mg/dL (ref 0.44–1.00)
GFR, Estimated: >60 mL/min (ref >60)
Glucose, Bld: 95 mg/dL (ref 70–99)
Potassium: 4.1 mmol/L (ref 3.5–5.1)
Sodium: 126 mmol/L — ABNORMAL LOW (ref 135–145)
Total Bilirubin: 0.3 mg/dL (ref 0.3–1.2)
Total Protein: 7.3 g/dL (ref 6.5–8.1)

## 2022-04-20 LAB — OSMOLALITY, URINE: Osmolality, Ur: 71 mosm/kg — ABNORMAL LOW (ref 300–900)

## 2022-04-20 LAB — URINALYSIS, ROUTINE W REFLEX MICROSCOPIC
Bilirubin Urine: NEGATIVE
Glucose, UA: NEGATIVE mg/dL
Ketones, ur: NEGATIVE mg/dL
Leukocytes,Ua: NEGATIVE
Nitrite: NEGATIVE
Protein, ur: NEGATIVE mg/dL
Specific Gravity, Urine: <1.005 — ABNORMAL LOW (ref 1.005–1.030)
pH: 6.5 (ref 5.0–8.0)

## 2022-04-20 LAB — CBC WITH DIFFERENTIAL/PLATELET
Abs Immature Granulocytes: 0.01 10*3/uL (ref 0.00–0.07)
Basophils Absolute: 0.1 10*3/uL (ref 0.0–0.1)
Basophils Relative: 1 %
Eosinophils Absolute: 0 10*3/uL (ref 0.0–0.5)
Eosinophils Relative: 1 %
HCT: 38.6 % (ref 36.0–46.0)
Hemoglobin: 13.4 g/dL (ref 12.0–15.0)
Immature Granulocytes: 0 %
Lymphocytes Relative: 27 %
Lymphs Abs: 1.3 10*3/uL (ref 0.7–4.0)
MCH: 30.9 pg (ref 26.0–34.0)
MCHC: 34.7 g/dL (ref 30.0–36.0)
MCV: 88.9 fL (ref 80.0–100.0)
Monocytes Absolute: 0.5 10*3/uL (ref 0.1–1.0)
Monocytes Relative: 9 %
Neutro Abs: 2.9 10*3/uL (ref 1.7–7.7)
Neutrophils Relative %: 62 %
Platelets: 207 10*3/uL (ref 150–400)
RBC: 4.34 MIL/uL (ref 3.87–5.11)
RDW: 12.1 % (ref 11.5–15.5)
WBC: 4.8 10*3/uL (ref 4.0–10.5)
nRBC: 0 % (ref 0.0–0.2)

## 2022-04-20 LAB — NA AND K (SODIUM & POTASSIUM), RAND UR
Potassium Urine: 7 mmol/L
Sodium, Ur: 19 mmol/L

## 2022-04-20 LAB — OSMOLALITY: Osmolality: 263 mosm/kg — ABNORMAL LOW (ref 275–295)

## 2022-04-20 LAB — TSH: TSH: 1.244 u[IU]/mL (ref 0.350–4.500)

## 2022-04-20 LAB — URINE CULTURE: Culture: NO GROWTH

## 2022-04-20 LAB — LIPASE, BLOOD: Lipase: 16 U/L (ref 11–51)

## 2022-04-20 MED ORDER — DICYCLOMINE HCL 10 MG PO CAPS
10.0000 mg | ORAL_CAPSULE | Freq: Once | ORAL | Status: AC
  Administered 2022-04-20: 10 mg via ORAL
  Filled 2022-04-20: qty 1

## 2022-04-20 MED ORDER — SODIUM CHLORIDE 1 G PO TABS: 1.0000 g | ORAL_TABLET | Freq: Two times a day (BID) | ORAL | 0 refills | Status: AC

## 2022-04-20 MED ORDER — OXYBUTYNIN CHLORIDE ER 10 MG PO TB24: 10.0000 mg | ORAL_TABLET | Freq: Every day | ORAL | 0 refills | Status: DC

## 2022-04-20 MED ORDER — SODIUM CHLORIDE 0.9 % IV BOLUS: 1000.0000 mL | Freq: Once | INTRAVENOUS | Status: DC

## 2022-04-20 MED ORDER — IOHEXOL 300 MG/ML  SOLN
100.0000 mL | Freq: Once | INTRAMUSCULAR | Status: AC | PRN
  Administered 2022-04-20: 80 mL via INTRAVENOUS

## 2022-04-20 MED ORDER — SODIUM CHLORIDE 0.9 % IV BOLUS
500.0000 mL | Freq: Once | INTRAVENOUS | Status: AC
  Administered 2022-04-20: 500 mL via INTRAVENOUS

## 2022-04-20 MED ORDER — KETOROLAC TROMETHAMINE 15 MG/ML IJ SOLN
15.0000 mg | Freq: Once | INTRAMUSCULAR | Status: AC
  Administered 2022-04-20: 15 mg via INTRAVENOUS
  Filled 2022-04-20: qty 1

## 2022-04-20 MED ORDER — ONDANSETRON HCL 4 MG/2ML IJ SOLN
4.0000 mg | Freq: Once | INTRAMUSCULAR | Status: AC
  Administered 2022-04-20: 4 mg via INTRAVENOUS
  Filled 2022-04-20: qty 2

## 2022-04-20 MED ORDER — HYDROMORPHONE HCL 1 MG/ML IJ SOLN
1.0000 mg | Freq: Once | INTRAMUSCULAR | Status: AC
  Administered 2022-04-20: 1 mg via INTRAVENOUS
  Filled 2022-04-20: qty 1

## 2022-04-20 MED ORDER — ONDANSETRON HCL 4 MG PO TABS: 4.0000 mg | ORAL_TABLET | ORAL | 0 refills | Status: DC | PRN

## 2022-04-20 MED ORDER — SODIUM CHLORIDE 0.9 % IV BOLUS
1000.0000 mL | Freq: Once | INTRAVENOUS | Status: AC
  Administered 2022-04-20: 1000 mL via INTRAVENOUS

## 2022-04-20 NOTE — ED Notes (Signed)
Pt sts that when she's getting up to go to the bathroom she's having more dizziness and nausea, orthostatics vitals negative and MD notified.

## 2022-04-20 NOTE — ED Provider Notes (Signed)
[facility] EMERGENCY DEPT Provider Note   CSN: [id number] Arrival date & time: 04/20/22  1522     History  Chief Complaint  Patient presents with   Urinary Tract Infection    Katelyn [name] is a 61 y.o. female.  Patient as above with significant medical history as below, including copd, anxiety, recurrent UTI, hysterectomy, IBS, tobacco abuse, overactive bladder who presents to the ED with complaint of suprapubic pain, dysuria, urgency/frequency, concern for UTI   Was seen 10/30 at [facility], diagnosed with cystitis, started on Vantin but due to insurance issues was switched to Keflex.  CT was unremarkable.  Urine culture reviewed from that visit has no growth.  Urinalysis that time was not consistent with UTI.  Her BMP did show hyponatremia at 125.  Baseline appears to be around 130  She has symptoms ongoing today, actually worsened when she started taking Azo. Mild nausea w/o emesis, no change to bowel function from baseline. No abnormal vaginal bleeding or discharge.    Past Medical History:  Diagnosis Date   Anxiety    COPD (chronic obstructive pulmonary disease) (HCC)    Depressive disorder    GERD (gastroesophageal reflux disease)    Hiatal hernia    HLD (hyperlipidemia)    Hypertension    IBS (irritable bowel syndrome)    IC (interstitial cystitis)    Migraine    Nicotine dependence    Nontoxic goiter    Overactive bladder    Smoking     Past Surgical History:  Procedure Laterality Date   ABDOMINAL HYSTERECTOMY     ADENOIDECTOMY     CHOLECYSTECTOMY     HERNIA REPAIR     NECK SURGERY     SHOULDER SURGERY     TONSILLECTOMY       The history is provided by the patient. No language interpreter was used.  Urinary Tract Infection Associated symptoms: abdominal pain and nausea   Associated symptoms: no fever and no flank pain        Home Medications Prior to Admission medications   Medication Sig Start Date End Date Taking?  Authorizing Provider  ondansetron (ZOFRAN) 4 MG tablet Take 1 tablet (4 mg total) by mouth every 4 (four) hours as needed for nausea or vomiting. 04/20/22  Yes Tanda Rockers A, DO  oxybutynin (DITROPAN XL) 10 MG 24 hr tablet Take 1 tablet (10 mg total) by mouth at bedtime. 04/20/22  Yes Tanda Rockers A, DO  sodium chloride 1 g tablet Take 1 tablet (1 g total) by mouth 2 (two) times daily with a meal for 3 days. 04/20/22 04/23/22 Yes Tanda Rockers A, DO  amLODipine (NORVASC) 10 MG tablet Take 1 tablet (10 mg total) by mouth daily. 04/15/21   Debbe Odea, MD  atorvastatin (LIPITOR) 40 MG tablet Take 40 mg by mouth daily. 01/01/15   [provider]  butalbital-acetaminophen-caffeine (FIORICET) 50-325-40 MG tablet Take 1 tablet by mouth daily as needed. 04/03/19   [provider]  cefpodoxime (VANTIN) 100 MG tablet Take 1 tablet (100 mg total) by mouth 2 (two) times daily for 7 days. 04/18/22 04/25/22  Poggi, Herb Grays, PA-C  cephALEXin (KEFLEX) 500 MG capsule Take 1 capsule (500 mg total) by mouth 2 (two) times daily. 04/19/22   Minna Antis, MD  cloNIDine (CATAPRES) 0.1 MG tablet Take 0.1 mg by mouth 2 (two) times daily. 04/29/20   [provider]  cyclobenzaprine (FLEXERIL) 5 MG tablet Take 1 tablet (5 mg total) by mouth  3 (three) times daily as needed for muscle spasms. 01/14/18   Loleta Rose, MD  D3-50 1.25 MG (50000 UT) capsule Take 50,000 Units by mouth once a week. 08/18/20   [provider]  dicyclomine (BENTYL) 10 MG capsule Take 1 capsule (10 mg total) by mouth 3 (three) times daily as needed for spasms. 03/03/22   Minna Antis, MD  esomeprazole (NEXIUM) 40 MG capsule Take 40 mg by mouth daily at 12 noon.    [provider]  famotidine (PEPCID) 40 MG tablet Take 40 mg by mouth at bedtime. 06/15/20   [provider]  FLOVENT HFA 220 MCG/ACT inhaler Inhale 2 puffs into the lungs 2 (two) times daily. 08/15/20   [provider]   gabapentin (NEURONTIN) 600 MG tablet Take 600 mg by mouth 3 (three) times daily. 04/07/20   [provider]  losartan-hydrochlorothiazide (HYZAAR) 100-25 MG tablet Take 1 tablet by mouth daily. 04/16/21   Debbe Odea, MD  methocarbamol (ROBAXIN) 500 MG tablet Take 500 mg by mouth 3 (three) times daily. 08/01/20   [provider]  metoCLOPramide (REGLAN) 10 MG tablet Take 10 mg by mouth 3 (three) times daily as needed. 06/04/20   [provider]  PREMARIN vaginal cream Place 1 Applicatorful vaginally daily. 03/26/20   [provider]  PROAIR HFA 108 (90 Base) MCG/ACT inhaler Inhale 1-2 puffs into the lungs as needed. 03/20/20   [provider]  promethazine (PHENERGAN) 25 MG tablet Take 25 mg by mouth every 6 (six) hours as needed for nausea or vomiting.    [provider]  SUMAtriptan (IMITREX) 100 MG tablet Take 1 tablet (100 mg total) by mouth every 2 (two) hours as needed for migraine. May repeat in 2 hours if headache persists or recurs. 01/27/14   Esperanza Sheets, MD      Allergies    Ciprofloxacin, Macrobid [nitrofurantoin monohyd macro], and Sulfa antibiotics    Review of Systems   Review of Systems  Constitutional:  Negative for activity change and fever.  HENT:  Negative for facial swelling and trouble swallowing.   Eyes:  Negative for discharge and redness.  Respiratory:  Negative for cough and shortness of breath.   Cardiovascular:  Negative for chest pain and palpitations.  Gastrointestinal:  Positive for abdominal pain and nausea.  Genitourinary:  Positive for dysuria, frequency and urgency. Negative for flank pain.  Musculoskeletal:  Negative for back pain and gait problem.  Skin:  Negative for pallor and rash.  Neurological:  Negative for syncope and headaches.    Physical Exam Updated Vital Signs BP (!) 155/85   Pulse 79   Temp (!) 97.5 F (36.4 C) (Oral)   Resp 17   Ht 5\' 5"  (1.651 m)   Wt 68 kg   SpO2  98%   BMI 24.95 kg/m  Physical Exam Vitals and nursing note reviewed.  Constitutional:      General: She is not in acute distress.    Appearance: Normal appearance.  HENT:     Head: Normocephalic and atraumatic.     Right Ear: External ear normal.     Left Ear: External ear normal.     Nose: Nose normal.     Mouth/Throat:     Mouth: Mucous membranes are moist.  Eyes:     General: No scleral icterus.       Right eye: No discharge.        Left eye: No discharge.  Cardiovascular:  Rate and Rhythm: Normal rate and regular rhythm.     Pulses: Normal pulses.     Heart sounds: Normal heart sounds.  Pulmonary:     Effort: Pulmonary effort is normal. No respiratory distress.     Breath sounds: Normal breath sounds.  Abdominal:     General: Abdomen is flat.     Palpations: Abdomen is soft.     Tenderness: There is abdominal tenderness.    Musculoskeletal:        General: Normal range of motion.     Cervical back: Normal range of motion.     Right lower leg: No edema.     Left lower leg: No edema.  Skin:    General: Skin is warm and dry.     Capillary Refill: Capillary refill takes less than 2 seconds.  Neurological:     Mental Status: She is alert and oriented to person, place, and time.     GCS: GCS eye subscore is 4. GCS verbal subscore is 5. GCS motor subscore is 6.  Psychiatric:        Mood and Affect: Mood normal.        Behavior: Behavior normal.     ED Results / Procedures / Treatments   Labs (all labs ordered are listed, but only abnormal results are displayed) Labs Reviewed  URINALYSIS, ROUTINE W REFLEX MICROSCOPIC - Abnormal; Notable for the following components:      Result Value   Specific Gravity, Urine <1.005 (*)    Hgb urine dipstick SMALL (*)    All other components within normal limits  COMPREHENSIVE METABOLIC PANEL - Abnormal; Notable for the following components:   Sodium 126 (*)    Chloride 94 (*)    BUN <5 (*)    Anion gap 4 (*)    All  other components within normal limits  OSMOLALITY, URINE - Abnormal; Notable for the following components:   Osmolality, Ur 71 (*)    All other components within normal limits  OSMOLALITY - Abnormal; Notable for the following components:   Osmolality 263 (*)    All other components within normal limits  CBC WITH DIFFERENTIAL/PLATELET  LIPASE, BLOOD  TSH  NA AND K (SODIUM & POTASSIUM), RAND UR    EKG None  Radiology CT ABDOMEN PELVIS W CONTRAST  Result Date: 04/20/2022 CLINICAL DATA:  Abdominal pain, dysuria EXAM: CT ABDOMEN AND PELVIS WITH CONTRAST TECHNIQUE: Multidetector CT imaging of the abdomen and pelvis was performed using the standard protocol following bolus administration of intravenous contrast. RADIATION DOSE REDUCTION: This exam was performed according to the departmental dose-optimization program which includes automated exposure control, adjustment of the mA and/or kV according to patient size and/or use of iterative reconstruction technique. CONTRAST:  17mL OMNIPAQUE IOHEXOL 300 MG/ML  SOLN COMPARISON:  04/18/2022 FINDINGS: Lower chest: Centrilobular emphysema is seen. Small linear densities are seen in right middle lobe suggesting minimal scarring or subsegmental atelectasis. Minimal pericardial effusion is present. Hepatobiliary: No focal abnormalities are seen in liver. Surgical clips are seen in gallbladder fossa. There is no dilation of bile ducts. Pancreas: No focal abnormalities are seen. Spleen: Unremarkable. Adrenals/Urinary Tract: Adrenals are unremarkable. There is no hydronephrosis. There are no renal or ureteral stones. Urinary bladder is unremarkable. Stomach/Bowel: Small hiatal hernia is seen. Small bowel loops are not dilated. Appendix is not dilated. There is no significant wall thickening in colon. Few diverticula are seen in colon without signs of focal acute diverticulitis. Vascular/Lymphatic: Extensive arterial calcifications are seen.  No significant  lymphadenopathy is seen. Reproductive: Uterus is not seen. Other: There is no ascites or pneumoperitoneum. Small umbilical hernia containing fat is seen. Small left inguinal hernia containing fat is seen. Musculoskeletal: There is first-degree anterolisthesis at L4-L5 level. There is disc space narrowing at L3-L4 and L4-L5 levels. There is encroachment of neural foramina at multiple levels. IMPRESSION: There is no evidence of intestinal obstruction or pneumoperitoneum. There is no hydronephrosis. Appendix is not dilated. COPD. Small hiatal hernia. Few diverticula are seen in colon without focal diverticulitis. Extensive arterial calcifications are seen suggesting significant atherosclerosis. Lumbar spondylosis. Other findings as described in the body of the report. Electronically Signed   By: Ernie AvenaPalani  Rathinasamy M.D.   On: 04/20/2022 18:52    Procedures Procedures    Medications Ordered in ED Medications  ketorolac (TORADOL) 15 MG/ML injection 15 mg (15 mg Intravenous Given 04/20/22 1717)  ondansetron (ZOFRAN) injection 4 mg (4 mg Intravenous Given 04/20/22 1717)  sodium chloride 0.9 % bolus 500 mL (0 mLs Intravenous Stopped 04/20/22 1941)  iohexol (OMNIPAQUE) 300 MG/ML solution 100 mL (80 mLs Intravenous Contrast Given 04/20/22 1826)  HYDROmorphone (DILAUDID) injection 1 mg (1 mg Intravenous Given 04/20/22 1852)  sodium chloride 0.9 % bolus 1,000 mL (0 mLs Intravenous Stopped 04/20/22 2102)  dicyclomine (BENTYL) capsule 10 mg (10 mg Oral Given 04/20/22 2011)  HYDROmorphone (DILAUDID) injection 1 mg (1 mg Intravenous Given 04/20/22 2010)    ED Course/ Medical Decision Making/ A&P                           Medical Decision Making Amount and/or Complexity of Data Reviewed Labs: ordered. Radiology: ordered.  Risk OTC drugs. Prescription drug management.   This patient presents to the ED with chief complaint(s) of abd pain suprapubic, urgency, frequency with pertinent past medical history of  recurrent uti, overactive bladder, ibs which further complicates the presenting complaint. The complaint involves an extensive differential diagnosis and also carries with it a high risk of complications and morbidity.    The differential diagnosis includes but not limited to Differential diagnosis includes but is not exclusive to ectopic pregnancy, ovarian cyst, ovarian torsion, acute appendicitis, urinary tract infection, endometriosis, bowel obstruction, hernia, colitis, renal colic, gastroenteritis, volvulus etc. . Serious etiologies were considered.   The initial plan is to screening labs/imaging, rpt ua, rpt bmp, analgesics/ivf   Additional history obtained: Additional history obtained from  na Records reviewed  prior ED visit, prior labs/imaging/ prior urine culture/ua   Independent labs interpretation:  The following labs were independently interpreted:  Sodium 126 Urinalysis unremarkable.  Send urine electrolytes and urine osmolality. Station normal.  Serum osmolality is mildly reduced  Independent visualization of imaging: - I independently visualized the following imaging with scope of interpretation limited to determining acute life threatening conditions related to emergency care: CT abdomen pelvis with IV contrast, which revealed diverticulosis, chronic changes  Cardiac monitoring was reviewed and interpreted by myself which shows NSR  Treatment and Reassessment: IV Fluids, analgesics, Bentyl, Toradol, Zofran >> symptoms improved  Consultation: - Consulted or discussed management/test interpretation w/ external professional: na  Consideration for admission or further workup: Admission was considered   Patient with hyponatremia, reviewed her medications, medications in the EMR incorrect.  She no longer is taking hydrochlorothiazide.  Patient with recurrent hyponatremia, unclear source.  Unlikely SIADH or nephrotic syndrome, not cirrhotic.  I did recommend admission for  further evaluation.  Patient refers to go home  and follow-up with PCP and urology as an outpatient as I feel her suprapubic discomfort is likely secondary to bladder spasm given workup as above. Will trial ditropan and recommend o/p uro f/u. Advised her to f/u with pcp in the next 2-3 days for rpt BMP and will trial salt tabs.  Pt reports that she will come back if symptoms worsen and will call urology tomorrow to follow up.   The patient has requested to leave the ED against medical advice. I believe this patient is of sound mind and medical decision making capacity to refuse medical care. The patient is responding and asking questions appropriately. The patient is oriented to person, place and time. The patient is not psychotic, delusional, suicidal, homicidal or hallucinating. The patient demonstrates a normal mental capacity to make decisions regarding their healthcare. The patient is clinically sober and does not appear to be under the influence of any illicit drugs at this time. The patient has been advised of the risks, in layman terms, of leaving AMA which include, but are not limited to death, coma, permanent disability, loss of current lifestyle, delay in diagnosis. Alternatives have been offered - the patient remains steadfast in their wish to leave. The patient has been advised that should they change their mind they are welcome to return to this hospital, or any other, at any time. The patient understands that in no way does an AMA discharge mean that I do not want them to have the best medical care available. To this end, I have offered appropriate prescriptions, referrals, and discharge instructions. The patient did sign AMA paperwork. The above discussion was witnessed by another member of staff.        Social Determinants of health: Counseled patient for approximately 3 minutes regarding smoking cessation. Discussed risks of smoking and how they applied and affected their visit here today.  Patient not ready to quit at this time, however will follow up with their primary doctor when they are.   CPT code: 62229: intermediate counseling for smoking cessation   Social History   Tobacco Use   Smoking status: Every Day    Packs/day: 0.50    Types: Cigarettes   Smokeless tobacco: Never  Substance Use Topics   Alcohol use: No   Drug use: No            Final Clinical Impression(s) / ED Diagnoses Final diagnoses:  Bladder spasm  Hyponatremia    Rx / DC Orders ED Discharge Orders          Ordered    oxybutynin (DITROPAN XL) 10 MG 24 hr tablet  Daily at bedtime        04/20/22 2116    sodium chloride 1 g tablet  2 times daily with meals        04/20/22 2116    ondansetron (ZOFRAN) 4 MG tablet  Every 4 hours PRN        04/20/22 2117              Sloan Leiter, DO 04/20/22 2347

## 2022-04-20 NOTE — ED Notes (Signed)
Discussed AMA with MD Pearline Cables and pt, ama forms signed.

## 2022-04-20 NOTE — Discharge Instructions (Addendum)
Please call to follow up with urology Please follow up with your pcp Please follow up with pcp for repeat metabolic panel in 3 days  It was a pleasure caring for you today in the emergency department.  Please return to the emergency department for any worsening or worrisome symptoms.

## 2022-04-20 NOTE — ED Notes (Signed)
Patient transported to CT 

## 2022-04-20 NOTE — ED Triage Notes (Signed)
Patient BIB [facility] from Home.  Endorses being Diagnosed with a UTI 3-4 Days ago. Has taken 3 Doses of Keflex but endorses continued symptoms and seeks Re-Evaluation.  Symptoms include Frequency and Dysuria. No Known Fevers.   NAD Noted during Triage. A&Ox4. GCS 15. BIB Stretcher and Ambulatory.

## 2022-04-26 ENCOUNTER — Emergency Department (HOSPITAL_BASED_OUTPATIENT_CLINIC_OR_DEPARTMENT_OTHER): Payer: Medicaid Other

## 2022-04-26 ENCOUNTER — Other Ambulatory Visit: Payer: Self-pay

## 2022-04-26 ENCOUNTER — Emergency Department (HOSPITAL_BASED_OUTPATIENT_CLINIC_OR_DEPARTMENT_OTHER)
Admission: EM | Admit: 2022-04-26 | Discharge: 2022-04-26 | Disposition: A | Discharge status: Home / Self Care | Payer: Medicaid Other | Attending: Emergency Medicine | Admitting: Emergency Medicine

## 2022-04-26 ENCOUNTER — Encounter (HOSPITAL_BASED_OUTPATIENT_CLINIC_OR_DEPARTMENT_OTHER): Payer: Self-pay

## 2022-04-26 DIAGNOSIS — I10 Essential (primary) hypertension: Secondary | ICD-10-CM | POA: Diagnosis not present

## 2022-04-26 DIAGNOSIS — R1030 Lower abdominal pain, unspecified: Secondary | ICD-10-CM | POA: Insufficient documentation

## 2022-04-26 DIAGNOSIS — Z79899 Other long term (current) drug therapy: Secondary | ICD-10-CM | POA: Insufficient documentation

## 2022-04-26 DIAGNOSIS — R339 Retention of urine, unspecified: Secondary | ICD-10-CM | POA: Diagnosis present

## 2022-04-26 LAB — CBC
HCT: 40.2 % (ref 36.0–46.0)
Hemoglobin: 13.6 g/dL (ref 12.0–15.0)
MCH: 30.6 pg (ref 26.0–34.0)
MCHC: 33.8 g/dL (ref 30.0–36.0)
MCV: 90.3 fL (ref 80.0–100.0)
Platelets: 250 10*3/uL (ref 150–400)
RBC: 4.45 MIL/uL (ref 3.87–5.11)
RDW: 12.4 % (ref 11.5–15.5)
WBC: 7.2 10*3/uL (ref 4.0–10.5)
nRBC: 0 % (ref 0.0–0.2)

## 2022-04-26 LAB — URINALYSIS, ROUTINE W REFLEX MICROSCOPIC
Bilirubin Urine: NEGATIVE
Glucose, UA: NEGATIVE mg/dL
Ketones, ur: NEGATIVE mg/dL
Leukocytes,Ua: NEGATIVE
Nitrite: NEGATIVE
Protein, ur: NEGATIVE mg/dL
Specific Gravity, Urine: <1.005 — ABNORMAL LOW (ref 1.005–1.030)
pH: 7 (ref 5.0–8.0)

## 2022-04-26 LAB — BASIC METABOLIC PANEL
Anion gap: 7 (ref 5–15)
BUN: 5 mg/dL — ABNORMAL LOW (ref 8–23)
CO2: 28 mmol/L (ref 22–32)
Calcium: 9.7 mg/dL (ref 8.9–10.3)
Chloride: 93 mmol/L — ABNORMAL LOW (ref 98–111)
Creatinine, Ser: 0.66 mg/dL (ref 0.44–1.00)
GFR, Estimated: >60 mL/min (ref >60)
Glucose, Bld: 108 mg/dL — ABNORMAL HIGH (ref 70–99)
Potassium: 4.1 mmol/L (ref 3.5–5.1)
Sodium: 128 mmol/L — ABNORMAL LOW (ref 135–145)

## 2022-04-26 MED ORDER — OXYCODONE-ACETAMINOPHEN 5-325 MG PO TABS
1.0000 | ORAL_TABLET | Freq: Once | ORAL | Status: AC
  Administered 2022-04-26: 1 via ORAL
  Filled 2022-04-26: qty 1

## 2022-04-26 MED ORDER — SODIUM CHLORIDE 0.9 % IV BOLUS: 1000.0000 mL | Freq: Once | INTRAVENOUS | Status: DC

## 2022-04-26 MED ORDER — HYDROCODONE-ACETAMINOPHEN 5-325 MG PO TABS
1.0000 | ORAL_TABLET | Freq: Once | ORAL | Status: AC
  Administered 2022-04-26: 1 via ORAL
  Filled 2022-04-26: qty 1

## 2022-04-26 NOTE — Discharge Instructions (Addendum)
Please call to make an appointment with urology tomorrow.  There is a physician and an office on these discharge papers.  It is important for you to call in the morning and tell them that you had a Foley catheter placed and need follow-up.  Also, please call your primary care provider and tell them about your visit and make sure that they know that you currently have a Foley catheter.  Return to the emergency department with any worsening symptoms.  Especially worsening abdominal pain, blood in your Foley catheter bag, fevers or any other concerns.

## 2022-04-26 NOTE — ED Notes (Signed)
Bladder scan amount 478 mL

## 2022-04-26 NOTE — ED Triage Notes (Signed)
Pt arrives via EMS from home. Pt has been treated for an UTI. Reports worsening left lower quadrant pain and difficulty urinating.

## 2022-04-26 NOTE — ED Triage Notes (Signed)
Pt states that she has recently been treated for a UTI. Pt states that she was feeling better but today is feeling worse and is difficulty urinating.

## 2022-04-26 NOTE — ED Notes (Signed)
Pt verbalizes understanding of discharge instructions. Opportunity for questioning and answers were provided. Pt discharged from ED to home with family.    

## 2022-04-26 NOTE — ED Provider Notes (Signed)
[facility] EMERGENCY DEPT Provider Note   CSN: [id number] Arrival date & time: 04/26/22  1346     History  Chief Complaint  Patient presents with   Urinary Tract Infection    Katelyn [name] is a 61 y.o. female with a past medical history of hypertension and hyperlipidemia presenting today with concern for urinary retention.  Reports that she was able to urinate all night and then acutely at 11 this morning she stopped being able to urinate.  This is happened in the past.  She says she drinks a lot of water and does not believe she is dehydrated.  Complaining of severe suprapubic pain.   Urinary Tract Infection      Home Medications Prior to Admission medications   Medication Sig Start Date End Date Taking? Authorizing Provider  ALPRAZolam Prudy Feeler) 1 MG tablet Take 1 mg by mouth 3 (three) times daily. 03/22/22   [provider]  amLODipine (NORVASC) 10 MG tablet Take 1 tablet (10 mg total) by mouth daily. 04/15/21   Debbe Odea, MD  atorvastatin (LIPITOR) 40 MG tablet Take 40 mg by mouth daily. 01/01/15   [provider]  buPROPion (WELLBUTRIN SR) 200 MG 12 hr tablet Take 200 mg by mouth 2 (two) times daily. 01/14/22   [provider]  butalbital-acetaminophen-caffeine (FIORICET) 50-325-40 MG tablet Take 1 tablet by mouth daily as needed. 04/03/19   [provider]  cephALEXin (KEFLEX) 500 MG capsule Take 1 capsule (500 mg total) by mouth 2 (two) times daily. 04/19/22   Minna Antis, MD  cloNIDine (CATAPRES) 0.1 MG tablet Take 0.1 mg by mouth 2 (two) times daily. 04/29/20   [provider]  cyclobenzaprine (FLEXERIL) 10 MG tablet Take 10 mg by mouth 3 (three) times daily. 03/25/22   [provider]  cyclobenzaprine (FLEXERIL) 5 MG tablet Take 1 tablet (5 mg total) by mouth 3 (three) times daily as needed for muscle spasms. 01/14/18   Loleta Rose, MD  D3-50 1.25 MG (50000 UT) capsule Take 50,000 Units by  mouth once a week. 08/18/20   [provider]  dicyclomine (BENTYL) 10 MG capsule Take 1 capsule (10 mg total) by mouth 3 (three) times daily as needed for spasms. 03/03/22   Minna Antis, MD  esomeprazole (NEXIUM) 40 MG capsule Take 40 mg by mouth daily at 12 noon.    [provider]  famotidine (PEPCID) 40 MG tablet Take 40 mg by mouth at bedtime. 06/15/20   [provider]  FLOVENT HFA 220 MCG/ACT inhaler Inhale 2 puffs into the lungs 2 (two) times daily. 08/15/20   [provider]  gabapentin (NEURONTIN) 600 MG tablet Take 600 mg by mouth 3 (three) times daily. 04/07/20   [provider]  losartan-hydrochlorothiazide (HYZAAR) 100-25 MG tablet Take 1 tablet by mouth daily. 04/16/21   Debbe Odea, MD  methocarbamol (ROBAXIN) 500 MG tablet Take 500 mg by mouth 3 (three) times daily. 08/01/20   [provider]  metoCLOPramide (REGLAN) 10 MG tablet Take 10 mg by mouth 3 (three) times daily as needed. 06/04/20   [provider]  metoprolol succinate (TOPROL-XL) 100 MG 24 hr tablet Take 100 mg by mouth daily. 01/28/22   [provider]  ondansetron (ZOFRAN) 4 MG tablet Take 1 tablet (4 mg total) by mouth every 4 (four) hours as needed for nausea or vomiting. 04/20/22   Sloan Leiter, DO  oxybutynin (DITROPAN XL) 10 MG 24 hr tablet Take 1 tablet (10 mg  total) by mouth at bedtime. 04/20/22   Sloan Leiter, DO  PREMARIN vaginal cream Place 1 Applicatorful vaginally daily. 03/26/20   [provider]  PROAIR HFA 108 (90 Base) MCG/ACT inhaler Inhale 1-2 puffs into the lungs as needed. 03/20/20   [provider]  promethazine (PHENERGAN) 25 MG tablet Take 25 mg by mouth every 6 (six) hours as needed for nausea or vomiting.    [provider]  QUEtiapine (SEROQUEL) 100 MG tablet Take 100 mg by mouth 2 (two) times daily. 02/18/22   [provider]  SUMAtriptan (IMITREX) 100 MG tablet Take 1 tablet (100  mg total) by mouth every 2 (two) hours as needed for migraine. May repeat in 2 hours if headache persists or recurs. 01/27/14   Esperanza Sheets, MD  Vitamin D, Ergocalciferol, (DRISDOL) 1.25 MG (50000 UNIT) CAPS capsule Take 50,000 Units by mouth once a week. 03/13/22   [provider]      Allergies    Ciprofloxacin, Macrobid [nitrofurantoin monohyd macro], and Sulfa antibiotics    Review of Systems   Review of Systems  Physical Exam Updated Vital Signs BP (!) 177/108 (BP Location: Right Arm)   Pulse 81   Temp 98.1 F (36.7 C) (Oral)   Resp 20   Ht 5\' 5"  (1.651 m)   Wt 67.6 kg   SpO2 100%   BMI 24.79 kg/m  Physical Exam Vitals and nursing note reviewed.  Constitutional:      Appearance: Normal appearance.  HENT:     Head: Normocephalic and atraumatic.  Eyes:     General: No scleral icterus.    Conjunctiva/sclera: Conjunctivae normal.  Pulmonary:     Effort: Pulmonary effort is normal. No respiratory distress.  Abdominal:     General: Abdomen is flat.     Palpations: Abdomen is soft.     Tenderness: There is abdominal tenderness (Suprapubic).     Comments: Very mild bilateral CVA tenderness  Musculoskeletal:     Comments: Ambulatory with large amounts of pain.  Skin:    Findings: No rash.  Neurological:     Mental Status: She is alert.  Psychiatric:        Mood and Affect: Mood normal.     ED Results / Procedures / Treatments   Labs (all labs ordered are listed, but only abnormal results are displayed) Labs Reviewed  URINALYSIS, ROUTINE W REFLEX MICROSCOPIC - Abnormal; Notable for the following components:      Result Value   Color, Urine COLORLESS (*)    Specific Gravity, Urine <1.005 (*)    Hgb urine dipstick SMALL (*)    All other components within normal limits  BASIC METABOLIC PANEL - Abnormal; Notable for the following components:   Sodium 128 (*)    Chloride 93 (*)    Glucose, Bld 108 (*)    BUN 5 (*)    All other components within  normal limits  URINE CULTURE  CBC    EKG None  Radiology CT Renal Stone Study  Result Date: 04/26/2022 CLINICAL DATA:  Nephrolithiasis. Difficulty urinating. EXAM: CT ABDOMEN AND PELVIS WITHOUT CONTRAST TECHNIQUE: Multidetector CT imaging of the abdomen and pelvis was performed following the standard protocol without IV contrast. RADIATION DOSE REDUCTION: This exam was performed according to the departmental dose-optimization program which includes automated exposure control, adjustment of the mA and/or kV according to patient size and/or use of iterative reconstruction technique. COMPARISON:  Multiple prior exams most recently 6 days ago 04/20/2022  FINDINGS: Lower chest: Emphysema without acute findings. Hepatobiliary: Unremarkable unenhanced appearance of the liver. Clips in the gallbladder fossa postcholecystectomy. No biliary dilatation. Pancreas: No ductal dilatation or inflammation. Spleen: Normal in size without focal abnormality. Adrenals/Urinary Tract: Normal adrenal glands. No hydronephrosis. No renal calculi. No suspicious renal lesion. Physiologically distended urinary bladder, no wall thickening or stone. Stomach/Bowel: Small hiatal hernia. No bowel obstruction or inflammation. Small to moderate colonic stool burden. A few distal colonic diverticula without diverticulitis. Normal appendix. Vascular/Lymphatic: Aortic and branch atherosclerosis. No aneurysm. No bulky adenopathy. Reproductive: Status post hysterectomy. No adnexal masses. Other: No free air or ascites. Tiny left inguinal hernia. Musculoskeletal: Grade 1 anterolisthesis of L4 on L5, chronic and unchanged. There are no acute or suspicious osseous abnormalities. IMPRESSION: 1. No renal stones or obstructive uropathy. 2. No acute abnormality in the abdomen/pelvis. 3. Mild colonic diverticulosis.  Small hiatal hernia. Aortic Atherosclerosis (ICD10-I70.0) and Emphysema (ICD10-J43.9). Electronically Signed   By: Narda Rutherford M.D.    On: 04/26/2022 20:23    Procedures Procedures   Medications Ordered in ED Medications  HYDROcodone-acetaminophen (NORCO/VICODIN) 5-325 MG per tablet 1 tablet (1 tablet Oral Given 04/26/22 2005)  oxyCODONE-acetaminophen (PERCOCET/ROXICET) 5-325 MG per tablet 1 tablet (1 tablet Oral Given 04/26/22 2308)    ED Course/ Medical Decision Making/ A&P                           Medical Decision Making Amount and/or Complexity of Data Reviewed Labs: ordered. Radiology: ordered.  Risk Prescription drug management.   61 year old female presenting with urinary retention.  Differential includes but is not limited to bladder outlet obstruction, spinal cord injury, dehydration, bladder spasms UTI.    This is not an exhaustive differential.    Past Medical History / Co-morbidities / Social History: History of constipation and UTI.  History of hypertension that was previously on diuretic however she is no longer on her diuretic.   Additional history: Per chart review patient was seen in the emergency department on October 30 and diagnosed with cystitis with hematuria.  Discharged on antibiotics.  2 days later she presented to the emergency department with a concern for UTI symptoms at that time she was diagnosed with a bladder spasm.  Admission was suggested however patient wanted to sign out of the department.  Left AMA.  During both of those visits she was also hyponatremic.  Patient is not on any medications that should cause hyponatremia.  In 2018 she had an episode of urinary retention that required Foley catheter insertion.  She subsequently followed up with urology and they thought her retention to be secondary to severe constipation.  She also has chronic hematuria with urology   Physical Exam: Pertinent physical exam findings include Suprapubic tenderness Near 500 mL postvoidal bladder  Lab Tests: I ordered, and personally interpreted labs.  The pertinent results include: Sodium 128,  6 days ago was 126, 8 days before that 125.  In the last year there are no readings of sodium within normal limits. UA with small hemoglobin on dipstick, no RBCs or signs of infection.  No proteinuria   Imaging Studies: I ordered and independently visualized and interpreted CT renal and I agree with the radiologist that there are no abnormalities     Medications: I ordered medication including Vicodin. Reevaluation of the patient after these medicines showed that the patient improved.  A few hours after she started to complain of pain and was given a  Percocet prior to discharge   MDM/Disposition: This is a 61 year old female who presented with urinary retention.  Started this morning.  She says that she is able to void small amounts but feels like she never gets everything out.  Bladder scanner revealing of almost 500 mL of urine.  Foley catheter was inserted.  Because of her back pain and side pain I ordered CT renal to assure there was no obstructing stones.  This was negative.  She was hyponatremic however this appears to be chronic and she is not symptomatic of her hyponatremia.  This is something that needs to be followed up outpatient.  She was given the referral to urology and encouraged to follow-up within the next week being that she has an ambulatory Foley catheter.  She is also seen them in the past with the same thing happened in 2018 and she was told that she could not visit the same office.  All questions were answered and she was discharged home.  She was hoping for admission however while I did consider this, at this time I do not have a strong indication to admit her to [facility].    I discussed this case with my attending physician Dr. Philip Aspen who cosigned this note including patient's presenting symptoms, physical exam, and planned diagnostics and interventions. Attending physician stated agreement with plan or made changes to plan which were implemented.      Final Clinical Impression(s) / ED Diagnoses Final diagnoses:  Urinary retention    Rx / DC Orders ED Discharge Orders     None      Results and diagnoses were explained to the patient. Return precautions discussed in full. Patient had no additional questions and expressed complete understanding.   This chart was dictated using voice recognition software.  Despite best efforts to proofread,  errors can occur which can change the documentation meaning.    Darliss Ridgel 04/26/22 2336    Fransico Meadow, MD 04/27/22 1253

## 2022-04-28 LAB — URINE CULTURE: Culture: 10000 — AB

## 2022-04-29 ENCOUNTER — Telehealth (HOSPITAL_BASED_OUTPATIENT_CLINIC_OR_DEPARTMENT_OTHER): Payer: Self-pay | Admitting: *Deleted

## 2022-04-29 NOTE — Telephone Encounter (Signed)
Post ED Visit - Positive Culture Follow-up  Culture report reviewed by antimicrobial stewardship pharmacist: [facility] []  , Pharm.D. []  Enzo Bi, Pharm.D., BCPS AQ-ID []  , Pharm.D., BCPS []  Celedonio Miyamoto, .D., BCPS []  [place], .D., BCPS, AAHIVP []  Georgina Pillion, Pharm.D., BCPS, AAHIVP []  [address], PharmD, BCPS []  , PharmD, BCPS []  [place], PharmD, BCPS []  [address], PharmD []  , PharmD, BCPS []  Estella Husk, PharmD  Pharmacy Team []  Lysle Pearl, PharmD []  , PharmD []  Phillips Climes, PharmD []  , Rph []  Agapito Games) , PharmD []  Verlan Friends, PharmD []  , PharmD []  Mervyn Gay, PharmD []  , PharmD []  Vinnie Level, PharmD []  [facility], PharmD []  , PharmD []  Len Childs, PharmD   Positive urine culture Treated with urinary retention and no further patient follow-up is required at this time.  [facility] 04/29/2022, 11:15 AM

## 2022-04-29 NOTE — Progress Notes (Signed)
ED Antimicrobial Stewardship Positive Culture Follow Up   [name] is an 61 y.o. female who presented to [facility] on 04/26/2022 with a chief complaint of  Chief Complaint  Patient presents with   Urinary Tract Infection    Recent Results (from the past 720 hour(s))  Urine Culture     Status: None   Collection Time: 04/18/22  3:30 PM   Specimen: Urine, Clean Catch  Result Value Ref Range Status   Specimen Description   Final    URINE, CLEAN CATCH Performed at [facility], [address] [place], [place] [address]    Special Requests   Final    NONE Performed at [facility], [address] [place], [place] [address]    Culture   Final    NO GROWTH Performed at [facility], [address] [place], [place] [address]    Report Status 04/20/2022 FINAL  Final  Urine Culture     Status: Abnormal   Collection Time: 04/26/22  3:19 PM   Specimen: Urine, Clean Catch  Result Value Ref Range Status   Specimen Description   Final    URINE, CLEAN CATCH Performed at [facility], [address] [place], [place] [address]    Special Requests   Final    NONE Performed at [facility], [address] [place], [place] [address]    Culture 10,000 COLONIES/mL PSEUDOMONAS AERUGINOSA (A)  Final   Report Status 04/28/2022 FINAL  Final   Organism ID, Bacteria PSEUDOMONAS AERUGINOSA (A)  Final      Susceptibility   Pseudomonas aeruginosa - MIC*    CEFTAZIDIME 4 SENSITIVE Sensitive     CIPROFLOXACIN <=0.25 SENSITIVE Sensitive     GENTAMICIN <=1 SENSITIVE Sensitive     IMIPENEM 2 SENSITIVE Sensitive     PIP/TAZO 8 SENSITIVE Sensitive     CEFEPIME 2 SENSITIVE Sensitive     * 10,000 COLONIES/mL PSEUDOMONAS AERUGINOSA    [x]  Patient discharged originally without antimicrobial agent and treatment still not indicated. Patient with chief complaint of difficulty with urination. In ED, patient was diagnosed with urinary  retention and given foley catheter with urology follow-up. UA not suggestive of an infection with negative nitrites and negative leukocytes. No antibiotic therapy indicated at this time. Discussed plan with MD.   ED Provider: , MD   [name] 04/29/2022, 10:01 AM Clinical Pharmacist Monday - Friday phone -  [phone] Saturday - Sunday phone - [phone]

## 2022-05-18 ENCOUNTER — Encounter: Payer: Self-pay | Admitting: Urology

## 2022-05-18 ENCOUNTER — Other Ambulatory Visit: Payer: Self-pay | Admitting: Urology

## 2022-05-18 ENCOUNTER — Ambulatory Visit (INDEPENDENT_AMBULATORY_CARE_PROVIDER_SITE_OTHER): Payer: Medicaid Other | Admitting: Urology

## 2022-05-18 VITALS — BP 181/122 | HR 92 | Ht 65.0 in | Wt 153.0 lb

## 2022-05-18 DIAGNOSIS — R339 Retention of urine, unspecified: Secondary | ICD-10-CM

## 2022-05-18 DIAGNOSIS — R3989 Other symptoms and signs involving the genitourinary system: Secondary | ICD-10-CM

## 2022-05-18 DIAGNOSIS — R300 Dysuria: Secondary | ICD-10-CM

## 2022-05-18 LAB — BLADDER SCAN AMB NON-IMAGING

## 2022-05-18 MED ORDER — LEVOFLOXACIN 250 MG PO TABS: 250.0000 mg | ORAL_TABLET | Freq: Every day | ORAL | 0 refills | Status: DC

## 2022-05-18 NOTE — Progress Notes (Signed)
Error

## 2022-05-18 NOTE — Progress Notes (Signed)
05/18/2022 4:56 PM   Katelyn [name] Sep 09, 1960 [id number]  Referring provider: [name], MD [address] [place],  [place] [address]  Chief Complaint  Patient presents with   Other    HPI: Katelyn [name] is a 61 y.o. female presents for urinary retention/voiding trial  History of recurrent urinary retention dating back to 2018 Was seen by Dr. Elnoria [name] in 2016 for bladder overactivity I saw her in 2019 for urinary retention and as she did not follow-up for voiding trial Seen in [facility] 04/26/2022 complaining of urinary retention and a Foley catheter was placed for a residual of 500 mL of urine Noncontrast CT abdomen pelvis showed bladder distention but no upper tract abnormalities Urine culture was positive for 10,000 colonies of Pseudomonas which was treated She does have baseline urinary frequency, urgency and hesitancy Denies gross hematuria Has previously declined further evaluation Followed by neurology for chronic headaches Has interstitial cystitis listed in past medical history though no urologic notes mention this diagnosis   PMH: Past Medical History:  Diagnosis Date   Anxiety    COPD (chronic obstructive pulmonary disease) ([place])    Depressive disorder    GERD (gastroesophageal reflux disease)    Hiatal hernia    HLD (hyperlipidemia)    Hypertension    IBS (irritable bowel syndrome)    IC (interstitial cystitis)    Migraine    Nicotine dependence    Nontoxic goiter    Overactive bladder    Smoking     Surgical History: Past Surgical History:  Procedure Laterality Date   ABDOMINAL HYSTERECTOMY     ADENOIDECTOMY     CHOLECYSTECTOMY     HERNIA REPAIR     NECK SURGERY     SHOULDER SURGERY     TONSILLECTOMY      Home Medications:  Allergies as of 05/18/2022       Reactions   Ciprofloxacin Other (See Comments)   Headache   Macrobid [nitrofurantoin Monohyd Macro] Itching   Sulfa Antibiotics Itching        Medication List         Accurate as of May 18, 2022  4:56 PM. If you have any questions, ask your nurse or doctor.          STOP taking these medications    buPROPion 200 MG 12 hr tablet Commonly known as: WELLBUTRIN SR Stopped by: Katelyn Sons, MD   cephALEXin 500 MG capsule Commonly known as: KEFLEX Stopped by: Katelyn Sons, MD   gabapentin 600 MG tablet Commonly known as: NEURONTIN Stopped by: Katelyn Sons, MD   losartan-hydrochlorothiazide 100-25 MG tablet Commonly known as: Hyzaar Stopped by: Katelyn Sons, MD   methocarbamol 500 MG tablet Commonly known as: ROBAXIN Stopped by: Katelyn Sons, MD   oxybutynin 10 MG 24 hr tablet Commonly known as: Ditropan XL Stopped by: Katelyn Sons, MD   Premarin vaginal cream Generic drug: conjugated estrogens Stopped by: Katelyn Sons, MD       TAKE these medications    ALPRAZolam 1 MG tablet Commonly known as: XANAX Take 1 mg by mouth 3 (three) times daily.   amLODipine 10 MG tablet Commonly known as: NORVASC Take 1 tablet (10 mg total) by mouth daily.   atorvastatin 40 MG tablet Commonly known as: LIPITOR Take 40 mg by mouth daily.   butalbital-acetaminophen-caffeine 50-325-40 MG tablet Commonly known as: FIORICET Take 1 tablet by mouth daily as needed.   cloNIDine 0.1  MG tablet Commonly known as: CATAPRES Take 0.1 mg by mouth 2 (two) times daily.   cyclobenzaprine 10 MG tablet Commonly known as: FLEXERIL Take 10 mg by mouth 3 (three) times daily. What changed: Another medication with the same name was removed. Continue taking this medication, and follow the directions you see here. Changed by: Katelyn Altes, MD   D3-50 1.25 MG (50000 UT) capsule Generic drug: Cholecalciferol Take 50,000 Units by mouth once a week.   dicyclomine 10 MG capsule Commonly known as: BENTYL Take 1 capsule (10 mg total) by mouth 3 (three) times daily as needed for spasms.   esomeprazole 40 MG capsule Commonly known as:  NEXIUM Take 40 mg by mouth daily at 12 noon.   famotidine 40 MG tablet Commonly known as: PEPCID Take 40 mg by mouth at bedtime.   Flovent HFA 220 MCG/ACT inhaler Generic drug: fluticasone Inhale 2 puffs into the lungs 2 (two) times daily.   levofloxacin 250 MG tablet Commonly known as: Levaquin Take 1 tablet (250 mg total) by mouth daily. Started by: Katelyn Cowboy, PA-C   metoCLOPramide 10 MG tablet Commonly known as: REGLAN Take 10 mg by mouth 3 (three) times daily as needed.   metoprolol succinate 100 MG 24 hr tablet Commonly known as: TOPROL-XL Take 100 mg by mouth daily.   ondansetron 4 MG tablet Commonly known as: ZOFRAN Take 1 tablet (4 mg total) by mouth every 4 (four) hours as needed for nausea or vomiting.   ProAir HFA 108 (90 Base) MCG/ACT inhaler Generic drug: albuterol Inhale 1-2 puffs into the lungs as needed.   promethazine 25 MG tablet Commonly known as: PHENERGAN Take 25 mg by mouth every 6 (six) hours as needed for nausea or vomiting.   QUEtiapine 100 MG tablet Commonly known as: SEROQUEL Take 100 mg by mouth 2 (two) times daily.   SUMAtriptan 100 MG tablet Commonly known as: IMITREX Take 1 tablet (100 mg total) by mouth every 2 (two) hours as needed for migraine. May repeat in 2 hours if headache persists or recurs.   Vitamin D (Ergocalciferol) 1.25 MG (50000 UNIT) Caps capsule Commonly known as: DRISDOL Take 50,000 Units by mouth once a week.        Allergies:  Allergies  Allergen Reactions   Ciprofloxacin Other (See Comments)    Headache   Macrobid [Nitrofurantoin Monohyd Macro] Itching   Sulfa Antibiotics Itching    Family History: Family History  Problem Relation Age of Onset   CAD Father        onset age 67   Heart disease Father    Kidney disease Father    Prostate cancer Father    Colon polyps Maternal Grandmother    Colon cancer Paternal Grandmother    Bladder Cancer Neg Hx    Kidney cancer Neg Hx     Social  History:  reports that she has been smoking cigarettes. She has been smoking an average of .5 packs per day. She has never used smokeless tobacco. She reports that she does not drink alcohol and does not use drugs.   Physical Exam: BP (!) 181/122   Pulse 92   Ht 5\' 5"  (1.651 m)   Wt 153 lb (69.4 kg)   BMI 25.46 kg/m   Constitutional:  Alert and oriented, No acute distress. HEENT: [place] AT Respiratory: Normal respiratory effort, no increased work of breathing. Psychiatric: Normal mood and affect.    Assessment & Plan:    1.  Recurrent urinary  retention Catheter removed for voiding trial She is interested in further evaluation and will request urodynamic study at [facility] ***  Katelyn Sons, MD  [facility] [address], [place], [place] [address] [phone]

## 2022-05-18 NOTE — Progress Notes (Signed)
She returned this afternoon stating she has urinating 15 times, but she is having dysuria and intense dysuria.  PVR 0 mL.  UA yellow slightly cloudy, specific area 1.010, 2+ blood, pH 7.0, leukocyte +1, 11-30 WBCs, 11-30 RBCs, 0-10 epithelial cells, 0-10 renal cells.  Urine is sent for culture.  She started Levaquin 250 once daily for 3 days.

## 2022-05-19 ENCOUNTER — Encounter: Payer: Self-pay | Admitting: Urology

## 2022-05-19 LAB — MICROSCOPIC EXAMINATION: Bacteria, UA: NONE SEEN

## 2022-05-19 LAB — URINALYSIS, COMPLETE
Bilirubin, UA: NEGATIVE
Glucose, UA: NEGATIVE
Ketones, UA: NEGATIVE
Nitrite, UA: NEGATIVE
Protein,UA: NEGATIVE
Specific Gravity, UA: 1.01 (ref 1.005–1.030)
Urobilinogen, Ur: 0.2 mg/dL (ref 0.2–1.0)
pH, UA: 7 (ref 5.0–7.5)

## 2022-05-20 ENCOUNTER — Other Ambulatory Visit: Payer: Self-pay | Admitting: Urology

## 2022-05-20 DIAGNOSIS — R339 Retention of urine, unspecified: Secondary | ICD-10-CM

## 2022-05-21 LAB — CULTURE, URINE COMPREHENSIVE

## 2022-05-23 ENCOUNTER — Telehealth: Payer: Self-pay

## 2022-05-23 NOTE — Progress Notes (Unsigned)
05/24/2022 4:18 PM   Katelyn [name] [birth year] [id number]  Referring provider: Alease Medina, MD [address] [place],  [place] [address]  Urological history: 1. LU TS -contributing factors of age, anxiety, HTN, smoker, IC and pelvic surgery -PVR 134 mL   Chief Complaint  Patient presents with   Urinary Retention    HPI: Katelyn [name] is a 61 y.o. female who presents today for persistent dysuria and urgency after catheter removed.    She states that she continues to have urgency and dysuria and frequency with lower abdominal pain.  She states her symptoms did abate while on the Levaquin, but once she stopped the Levaquin the symptoms immediately returned.  Patient denies any modifying or aggravating factors.  Patient denies any gross hematuria or suprapubic/flank pain.  Patient denies any fevers, chills, nausea or vomiting.    UA yellow clear, specific gravity less than 1.005, 1+ blood, pH 6.5, leukocyte trace, 0.5 RBCs, 3-10 RBCs and moderate bacteria.  PVR 134 mL    PMH: Past Medical History:  Diagnosis Date   Anxiety    COPD (chronic obstructive pulmonary disease) (HCC)    Depressive disorder    GERD (gastroesophageal reflux disease)    Hiatal hernia    HLD (hyperlipidemia)    Hypertension    IBS (irritable bowel syndrome)    IC (interstitial cystitis)    Migraine    Nicotine dependence    Nontoxic goiter    Overactive bladder    Smoking     Surgical History: Past Surgical History:  Procedure Laterality Date   ABDOMINAL HYSTERECTOMY     ADENOIDECTOMY     CHOLECYSTECTOMY     HERNIA REPAIR     NECK SURGERY     SHOULDER SURGERY     TONSILLECTOMY      Home Medications:  Allergies as of 05/24/2022       Reactions   Ciprofloxacin Other (See Comments)   Headache   Macrobid [nitrofurantoin Monohyd Macro] Itching   Sulfa Antibiotics Itching        Medication List        Accurate as of May 24, 2022  4:18 PM. If you have any questions, ask  your nurse or doctor.          ALPRAZolam 1 MG tablet Commonly known as: XANAX Take 1 mg by mouth 3 (three) times daily.   amLODipine 10 MG tablet Commonly known as: NORVASC Take 1 tablet (10 mg total) by mouth daily.   atorvastatin 40 MG tablet Commonly known as: LIPITOR Take 40 mg by mouth daily.   butalbital-acetaminophen-caffeine 50-325-40 MG tablet Commonly known as: FIORICET Take 1 tablet by mouth daily as needed.   cloNIDine 0.1 MG tablet Commonly known as: CATAPRES Take 0.1 mg by mouth 2 (two) times daily.   cyclobenzaprine 10 MG tablet Commonly known as: FLEXERIL Take 10 mg by mouth 3 (three) times daily.   D3-50 1.25 MG (50000 UT) capsule Generic drug: Cholecalciferol Take 50,000 Units by mouth once a week.   dicyclomine 10 MG capsule Commonly known as: BENTYL Take 1 capsule (10 mg total) by mouth 3 (three) times daily as needed for spasms.   esomeprazole 40 MG capsule Commonly known as: NEXIUM Take 40 mg by mouth daily at 12 noon.   famotidine 40 MG tablet Commonly known as: PEPCID Take 40 mg by mouth at bedtime.   Flovent HFA 220 MCG/ACT inhaler Generic drug: fluticasone Inhale 2 puffs into the lungs  2 (two) times daily.   levofloxacin 500 MG tablet Commonly known as: LEVAQUIN Take 1 tablet (500 mg total) by mouth daily. What changed:  medication strength how much to take Changed by: Cass Edinger, PA-C   metoCLOPramide 10 MG tablet Commonly known as: REGLAN Take 10 mg by mouth 3 (three) times daily as needed.   metoprolol succinate 100 MG 24 hr tablet Commonly known as: TOPROL-XL Take 100 mg by mouth daily.   ondansetron 4 MG tablet Commonly known as: ZOFRAN Take 1 tablet (4 mg total) by mouth every 4 (four) hours as needed for nausea or vomiting.   ProAir HFA 108 (90 Base) MCG/ACT inhaler Generic drug: albuterol Inhale 1-2 puffs into the lungs as needed.   promethazine 25 MG tablet Commonly known as: PHENERGAN Take 25 mg by  mouth every 6 (six) hours as needed for nausea or vomiting.   QUEtiapine 100 MG tablet Commonly known as: SEROQUEL Take 100 mg by mouth 2 (two) times daily.   SUMAtriptan 100 MG tablet Commonly known as: IMITREX Take 1 tablet (100 mg total) by mouth every 2 (two) hours as needed for migraine. May repeat in 2 hours if headache persists or recurs.   Uribel 118 MG Caps Take 1 capsule (118 mg total) by mouth every 6 (six) hours as needed. Started by: Michiel Cowboy, PA-C   Vitamin D (Ergocalciferol) 1.25 MG (50000 UNIT) Caps capsule Commonly known as: DRISDOL Take 50,000 Units by mouth once a week.        Allergies:  Allergies  Allergen Reactions   Ciprofloxacin Other (See Comments)    Headache   Macrobid [Nitrofurantoin Monohyd Macro] Itching   Sulfa Antibiotics Itching    Family History: Family History  Problem Relation Age of Onset   CAD Father        onset age 68   Heart disease Father    Kidney disease Father    Prostate cancer Father    Colon polyps Maternal Grandmother    Colon cancer Paternal Grandmother    Bladder Cancer Neg Hx    Kidney cancer Neg Hx     Social History:  reports that she has been smoking cigarettes. She has been smoking an average of .5 packs per day. She has never used smokeless tobacco. She reports that she does not drink alcohol and does not use drugs.  ROS: Pertinent ROS in HPI  Physical Exam: BP (!) 159/107   Pulse 96   Ht 5\' 4"  (1.626 m)   Wt 152 lb (68.9 kg)   BMI 26.09 kg/m   Constitutional:  Well nourished. Alert and oriented, No acute distress. HEENT:  AT, moist mucus membranes.  Trachea midline Cardiovascular: No clubbing, cyanosis, or edema. Respiratory: Normal respiratory effort, no increased work of breathing. Neurologic: Grossly intact, no focal deficits, moving all 4 extremities. Psychiatric: Normal mood and affect.    Laboratory Data: Lab Results  Component Value Date   WBC 7.2 04/26/2022   HGB 13.6  04/26/2022   HCT 40.2 04/26/2022   MCV 90.3 04/26/2022   PLT 250 04/26/2022    Lab Results  Component Value Date   CREATININE 0.66 04/26/2022    Lab Results  Component Value Date   TSH 1.244 04/20/2022    Lab Results  Component Value Date   AST 30 04/20/2022   Lab Results  Component Value Date   ALT 33 04/20/2022    Urinalysis See EPIC and HPI I have reviewed the labs.   Pertinent Imaging:  05/24/22 15:32  Scan Result    Assessment & Plan:    1. Dysuria -UA still with micro heme -urine culture pending -start Levaquin 500 mg daily -Uribel for discomfort  2. Microscopic hematuria -may be due to catheter trauma and UTI -We will continue to monitor and if it does not resolve we will need to pursue a formal hematuria work-up    Return for pending urine culture results .  These notes generated with voice recognition software. I apologize for typographical errors.  Cloretta Ned  [facility] [address]  [address] [place], [place] [address] [phone]

## 2022-05-23 NOTE — Telephone Encounter (Signed)
Notified pt of results. Pt states as of yesterday she is still experiencing some burning and frequency. Appt made for follow up. Pt states she will call back if she is not able to keep appt.

## 2022-05-23 NOTE — Telephone Encounter (Signed)
-----   Message from Harle Battiest, PA-C sent at 05/22/2022 10:22 PM EST ----- Please let [name] know that her urine culture grew out a very small amount of bacteria.  She should feel better after taking the 3 days of Levaquin.  If not, we need to see her again.

## 2022-05-24 ENCOUNTER — Other Ambulatory Visit: Payer: Self-pay | Admitting: Urology

## 2022-05-24 ENCOUNTER — Encounter: Payer: Self-pay | Admitting: Urology

## 2022-05-24 ENCOUNTER — Ambulatory Visit (INDEPENDENT_AMBULATORY_CARE_PROVIDER_SITE_OTHER): Payer: Medicaid Other | Admitting: Urology

## 2022-05-24 VITALS — BP 159/107 | HR 96 | Ht 64.0 in | Wt 152.0 lb

## 2022-05-24 DIAGNOSIS — R3129 Other microscopic hematuria: Secondary | ICD-10-CM

## 2022-05-24 DIAGNOSIS — R300 Dysuria: Secondary | ICD-10-CM | POA: Diagnosis not present

## 2022-05-24 LAB — BLADDER SCAN AMB NON-IMAGING

## 2022-05-24 MED ORDER — LEVOFLOXACIN 500 MG PO TABS: 500.0000 mg | ORAL_TABLET | Freq: Every day | ORAL | 0 refills | Status: DC

## 2022-05-24 MED ORDER — URIBEL 118 MG PO CAPS: 1.0000 | ORAL_CAPSULE | Freq: Four times a day (QID) | ORAL | 0 refills | Status: DC | PRN

## 2022-05-25 LAB — URINALYSIS, COMPLETE
Bilirubin, UA: NEGATIVE
Glucose, UA: NEGATIVE
Ketones, UA: NEGATIVE
Nitrite, UA: NEGATIVE
Protein,UA: NEGATIVE
Specific Gravity, UA: <1.005 — ABNORMAL LOW (ref 1.005–1.030)
Urobilinogen, Ur: 0.2 mg/dL (ref 0.2–1.0)
pH, UA: 6.5 (ref 5.0–7.5)

## 2022-05-25 LAB — MICROSCOPIC EXAMINATION

## 2022-05-27 ENCOUNTER — Telehealth: Payer: Self-pay

## 2022-05-27 LAB — CULTURE, URINE COMPREHENSIVE

## 2022-05-27 NOTE — Telephone Encounter (Signed)
Ok per DPR, [facility] notifying pt, asked pt to return call.

## 2022-05-27 NOTE — Telephone Encounter (Signed)
-----   Message from Harle Battiest, PA-C sent at 05/27/2022  2:27 PM EST ----- Please let Katelyn [name] know her urine culture was negative.  We should get her scheduled for a cystoscopy to further investigate her symptoms.

## 2022-06-19 ENCOUNTER — Emergency Department
Admission: EM | Admit: 2022-06-19 | Discharge: 2022-06-19 | Disposition: A | Discharge status: Home / Self Care | Payer: Medicaid Other | Attending: Emergency Medicine | Admitting: Emergency Medicine

## 2022-06-19 ENCOUNTER — Emergency Department: Payer: Medicaid Other

## 2022-06-19 ENCOUNTER — Other Ambulatory Visit: Payer: Self-pay

## 2022-06-19 DIAGNOSIS — R1030 Lower abdominal pain, unspecified: Secondary | ICD-10-CM | POA: Diagnosis present

## 2022-06-19 DIAGNOSIS — I10 Essential (primary) hypertension: Secondary | ICD-10-CM | POA: Diagnosis not present

## 2022-06-19 DIAGNOSIS — R300 Dysuria: Secondary | ICD-10-CM | POA: Diagnosis not present

## 2022-06-19 DIAGNOSIS — J449 Chronic obstructive pulmonary disease, unspecified: Secondary | ICD-10-CM | POA: Diagnosis not present

## 2022-06-19 LAB — URINALYSIS, ROUTINE W REFLEX MICROSCOPIC
Bacteria, UA: NONE SEEN
Bilirubin Urine: NEGATIVE
Glucose, UA: NEGATIVE mg/dL
Ketones, ur: NEGATIVE mg/dL
Leukocytes,Ua: NEGATIVE
Nitrite: NEGATIVE
Protein, ur: NEGATIVE mg/dL
Specific Gravity, Urine: 1.004 — ABNORMAL LOW (ref 1.005–1.030)
pH: 7 (ref 5.0–8.0)

## 2022-06-19 LAB — CBC
HCT: 38.5 % (ref 36.0–46.0)
Hemoglobin: 13.3 g/dL (ref 12.0–15.0)
MCH: 29.9 pg (ref 26.0–34.0)
MCHC: 34.5 g/dL (ref 30.0–36.0)
MCV: 86.5 fL (ref 80.0–100.0)
Platelets: 197 10*3/uL (ref 150–400)
RBC: 4.45 MIL/uL (ref 3.87–5.11)
RDW: 11.8 % (ref 11.5–15.5)
WBC: 4.8 10*3/uL (ref 4.0–10.5)
nRBC: 0 % (ref 0.0–0.2)

## 2022-06-19 LAB — COMPREHENSIVE METABOLIC PANEL
ALT: 25 U/L (ref 0–44)
AST: 24 U/L (ref 15–41)
Albumin: 3.8 g/dL (ref 3.5–5.0)
Alkaline Phosphatase: 76 U/L (ref 38–126)
Anion gap: 7 (ref 5–15)
BUN: 6 mg/dL — ABNORMAL LOW (ref 8–23)
CO2: 29 mmol/L (ref 22–32)
Calcium: 8.7 mg/dL — ABNORMAL LOW (ref 8.9–10.3)
Chloride: 87 mmol/L — ABNORMAL LOW (ref 98–111)
Creatinine, Ser: 0.59 mg/dL (ref 0.44–1.00)
GFR, Estimated: >60 mL/min (ref >60)
Glucose, Bld: 106 mg/dL — ABNORMAL HIGH (ref 70–99)
Potassium: 3.6 mmol/L (ref 3.5–5.1)
Sodium: 123 mmol/L — ABNORMAL LOW (ref 135–145)
Total Bilirubin: 0.2 mg/dL — ABNORMAL LOW (ref 0.3–1.2)
Total Protein: 7.2 g/dL (ref 6.5–8.1)

## 2022-06-19 LAB — LIPASE, BLOOD: Lipase: 30 U/L (ref 11–51)

## 2022-06-19 MED ORDER — DICYCLOMINE HCL 10 MG PO CAPS
20.0000 mg | ORAL_CAPSULE | Freq: Once | ORAL | Status: AC
  Administered 2022-06-19: 20 mg via ORAL
  Filled 2022-06-19: qty 2

## 2022-06-19 MED ORDER — IOHEXOL 350 MG/ML SOLN
100.0000 mL | Freq: Once | INTRAVENOUS | Status: AC | PRN
  Administered 2022-06-19: 100 mL via INTRAVENOUS

## 2022-06-19 MED ORDER — GABAPENTIN 300 MG PO CAPS: 300.0000 mg | ORAL_CAPSULE | Freq: Three times a day (TID) | ORAL | 0 refills | Status: DC | PRN

## 2022-06-19 MED ORDER — HALOPERIDOL LACTATE 5 MG/ML IJ SOLN
2.5000 mg | Freq: Once | INTRAMUSCULAR | Status: AC
  Administered 2022-06-19: 2.5 mg via INTRAVENOUS
  Filled 2022-06-19: qty 1

## 2022-06-19 MED ORDER — SODIUM CHLORIDE 0.9 % IV BOLUS
1000.0000 mL | Freq: Once | INTRAVENOUS | Status: AC
  Administered 2022-06-19: 1000 mL via INTRAVENOUS

## 2022-06-19 NOTE — ED Triage Notes (Signed)
Pt states she has been having trouble peeing on and off for the last 3 days- pt states that she has not been able to pee for the last 3 hours- pt states she also is having lower abd pain that goes through to her back and feels like she is going to pass out

## 2022-06-19 NOTE — Discharge Instructions (Signed)
Please seek medical attention for any high fevers, chest pain, shortness of breath, change in behavior, persistent vomiting, bloody stool or any other new or concerning symptoms.  

## 2022-06-19 NOTE — ED Provider Notes (Signed)
[facility] Provider Note    Event Date/Time   First MD Initiated Contact with Patient 06/19/22 1306     (approximate)   History   Chief Complaint: Abdominal Pain   HPI  Katelyn [name] is a 61 y.o. female with a history of hypertension, IBS, GERD, COPD who comes ED complaining of dysuria and urinary frequency and urgency for several days, inability to pee today.  Denies hematuria.  No vomiting fever diarrhea or constipation.  Also complains of diffuse lower abdominal pain radiating into her back.  Denies a history of kidney stones.  Patient also complains of left-sided pleuritic chest pain that is nonradiating, not exertional, sharp.  Been present for the past few hours.  Constant.     Physical Exam   Triage Vital Signs: ED Triage Vitals  Enc Vitals Group     BP 06/19/22 1141 (!) 120/93     Pulse Rate 06/19/22 1141 82     Resp 06/19/22 1141 18     Temp 06/19/22 1143 97.6 F (36.4 C)     Temp Source 06/19/22 1143 Oral     SpO2 06/19/22 1141 96 %     Weight 06/19/22 1142 150 lb (68 kg)     Height 06/19/22 1142 5\' 4"  (1.626 m)     Head Circumference --      Peak Flow --      Pain Score 06/19/22 1142 10     Pain Loc --      Pain Edu? --      Excl. in GC? --     Most recent vital signs: Vitals:   06/19/22 1307 06/19/22 1313  BP: (!) 154/86   Pulse: 68   Resp: 16   Temp:  (!) 97.4 F (36.3 C)  SpO2: 98%     General: Awake, no distress.  CV:  Good peripheral perfusion.  Regular rate and rhythm Resp:  Normal effort.  Quiet breath sounds diffusely Abd:  No distention.  Soft with diffuse lower abdominal tenderness.  Suprapubic fullness Other:  By mucous membranes   ED Results / Procedures / Treatments   Labs (all labs ordered are listed, but only abnormal results are displayed) Labs Reviewed  COMPREHENSIVE METABOLIC PANEL - Abnormal; Notable for the following components:      Result Value   Sodium 123 (*)    Chloride 87 (*)     Glucose, Bld 106 (*)    BUN 6 (*)    Calcium 8.7 (*)    Total Bilirubin 0.2 (*)    All other components within normal limits  URINALYSIS, ROUTINE W REFLEX MICROSCOPIC - Abnormal; Notable for the following components:   Color, Urine STRAW (*)    APPearance CLEAR (*)    Specific Gravity, Urine 1.004 (*)    Hgb urine dipstick MODERATE (*)    All other components within normal limits  LIPASE, BLOOD  CBC     EKG    RADIOLOGY CT chest abdomen pelvis pending   PROCEDURES:  Procedures   MEDICATIONS ORDERED IN ED: Medications  sodium chloride 0.9 % bolus 1,000 mL (1,000 mLs Intravenous New Bag/Given 06/19/22 1501)     IMPRESSION / MDM / ASSESSMENT AND PLAN / ED COURSE  I reviewed the triage vital signs and the nursing notes.                              Differential diagnosis includes,  but is not limited to, urinary retention, cystitis, pyelonephritis, ureterolithiasis, diverticulitis, appendicitis, pulmonary embolism, dissection  Patient's presentation is most consistent with acute presentation with potential threat to life or bodily function.  Patient presents with lower abdominal pain and urinary symptoms.  Clinically she seems to be experiencing urinary retention.  Foley catheter was placed, immediately draining 1000 mL of urine.  Urinalysis negative for signs of UTI.  Serum labs unremarkable except for sodium level of 123, representing a slight worsening of her chronic hyponatremia.  Patient's abdominal pain and tenderness has not improved with decompressing her bladder, so we will need to obtain CT imaging.  She is also now reporting pleuritic chest pain so will obtain CT chest to evaluate for PE along with abdomen pelvis to evaluate for diverticulitis or appendicitis.  Receiving saline bolus for hydration.  Care of the patient signed out to Dr. Derrill Kay pending CT results and symptom progress with IV fluids.       FINAL CLINICAL IMPRESSION(S) / ED DIAGNOSES    Final diagnoses:  Lower abdominal pain     Rx / DC Orders   ED Discharge Orders     None        Note:  This document was prepared using Dragon voice recognition software and may include unintentional dictation errors.   Sharman Cheek, MD 06/19/22 [phone]

## 2022-06-20 ENCOUNTER — Inpatient Hospital Stay (HOSPITAL_COMMUNITY)
Admission: EM | Admit: 2022-06-20 | Discharge: 2022-06-24 | DRG: 092 | Disposition: A | Discharge status: Home / Self Care | Payer: Medicaid Other | Attending: Internal Medicine | Admitting: Internal Medicine

## 2022-06-20 ENCOUNTER — Other Ambulatory Visit: Payer: Self-pay

## 2022-06-20 DIAGNOSIS — G928 Other toxic encephalopathy: Principal | ICD-10-CM | POA: Diagnosis present

## 2022-06-20 DIAGNOSIS — F32A Depression, unspecified: Secondary | ICD-10-CM | POA: Diagnosis present

## 2022-06-20 DIAGNOSIS — Z841 Family history of disorders of kidney and ureter: Secondary | ICD-10-CM

## 2022-06-20 DIAGNOSIS — K219 Gastro-esophageal reflux disease without esophagitis: Secondary | ICD-10-CM | POA: Diagnosis present

## 2022-06-20 DIAGNOSIS — R339 Retention of urine, unspecified: Secondary | ICD-10-CM | POA: Diagnosis present

## 2022-06-20 DIAGNOSIS — F411 Generalized anxiety disorder: Secondary | ICD-10-CM | POA: Diagnosis present

## 2022-06-20 DIAGNOSIS — R32 Unspecified urinary incontinence: Secondary | ICD-10-CM | POA: Diagnosis present

## 2022-06-20 DIAGNOSIS — R338 Other retention of urine: Secondary | ICD-10-CM | POA: Diagnosis present

## 2022-06-20 DIAGNOSIS — R4182 Altered mental status, unspecified: Principal | ICD-10-CM

## 2022-06-20 DIAGNOSIS — T424X5A Adverse effect of benzodiazepines, initial encounter: Secondary | ICD-10-CM | POA: Diagnosis present

## 2022-06-20 DIAGNOSIS — Y929 Unspecified place or not applicable: Secondary | ICD-10-CM

## 2022-06-20 DIAGNOSIS — Z8249 Family history of ischemic heart disease and other diseases of the circulatory system: Secondary | ICD-10-CM

## 2022-06-20 DIAGNOSIS — Z1152 Encounter for screening for COVID-19: Secondary | ICD-10-CM

## 2022-06-20 DIAGNOSIS — E876 Hypokalemia: Secondary | ICD-10-CM

## 2022-06-20 DIAGNOSIS — E861 Hypovolemia: Secondary | ICD-10-CM | POA: Diagnosis not present

## 2022-06-20 DIAGNOSIS — G9340 Encephalopathy, unspecified: Secondary | ICD-10-CM | POA: Diagnosis present

## 2022-06-20 DIAGNOSIS — J441 Chronic obstructive pulmonary disease with (acute) exacerbation: Secondary | ICD-10-CM | POA: Diagnosis present

## 2022-06-20 DIAGNOSIS — J449 Chronic obstructive pulmonary disease, unspecified: Secondary | ICD-10-CM | POA: Diagnosis present

## 2022-06-20 DIAGNOSIS — K589 Irritable bowel syndrome without diarrhea: Secondary | ICD-10-CM | POA: Diagnosis present

## 2022-06-20 DIAGNOSIS — I5032 Chronic diastolic (congestive) heart failure: Secondary | ICD-10-CM | POA: Diagnosis present

## 2022-06-20 DIAGNOSIS — Z720 Tobacco use: Secondary | ICD-10-CM | POA: Diagnosis present

## 2022-06-20 DIAGNOSIS — I10 Essential (primary) hypertension: Secondary | ICD-10-CM | POA: Diagnosis present

## 2022-06-20 DIAGNOSIS — Z79899 Other long term (current) drug therapy: Secondary | ICD-10-CM

## 2022-06-20 DIAGNOSIS — E785 Hyperlipidemia, unspecified: Secondary | ICD-10-CM | POA: Diagnosis present

## 2022-06-20 DIAGNOSIS — F17210 Nicotine dependence, cigarettes, uncomplicated: Secondary | ICD-10-CM | POA: Diagnosis present

## 2022-06-20 DIAGNOSIS — E871 Hypo-osmolality and hyponatremia: Secondary | ICD-10-CM

## 2022-06-20 DIAGNOSIS — Z888 Allergy status to other drugs, medicaments and biological substances status: Secondary | ICD-10-CM

## 2022-06-20 DIAGNOSIS — E222 Syndrome of inappropriate secretion of antidiuretic hormone: Secondary | ICD-10-CM | POA: Diagnosis present

## 2022-06-20 DIAGNOSIS — I110 Hypertensive heart disease with heart failure: Secondary | ICD-10-CM | POA: Diagnosis present

## 2022-06-20 DIAGNOSIS — Z882 Allergy status to sulfonamides status: Secondary | ICD-10-CM

## 2022-06-20 DIAGNOSIS — T423X5A Adverse effect of barbiturates, initial encounter: Secondary | ICD-10-CM | POA: Diagnosis present

## 2022-06-20 NOTE — ED Triage Notes (Signed)
Pt arrives to ED c/o AMS LKN 2000 today. Pt was seen yesterday for bladder issues. Pt was not seen to have any trauma per family. Pt reported to be combative and given 5mg  of versed. Pt with foley in place

## 2022-06-20 NOTE — ED Provider Notes (Signed)
[facility] EMERGENCY DEPARTMENT Provider Note   CSN: [id number] Arrival date & time: 06/20/22  2321     History  Chief Complaint  Patient presents with   Altered Mental Status    Katelyn [name] is a 62 y.o. female.  Patient is a 62 year old female with past medical history of hypertension, hyperlipidemia, GERD, migraines, irritable bowel.  Patient brought by EMS from home for evaluation of altered mental status.  Patient seen yesterday at [facility] for urinary retention.  Foley catheter was placed and 1000 cc of urine was obtained.  Patient was discharged with catheter in place.  Apparently this evening, she developed confusion and disorientation.  Family called 911 and patient transported here.  She was apparently uncooperative and required Versed by EMS.  Patient offers no additional history.  The history is provided by the patient.       Home Medications Prior to Admission medications   Medication Sig Start Date End Date Taking? Authorizing Provider  ALPRAZolam Prudy Feeler) 1 MG tablet Take 1 mg by mouth 3 (three) times daily. 03/22/22   [provider]  amLODipine (NORVASC) 10 MG tablet Take 1 tablet (10 mg total) by mouth daily. 04/15/21   Debbe Odea, MD  atorvastatin (LIPITOR) 40 MG tablet Take 40 mg by mouth daily. 01/01/15   [provider]  butalbital-acetaminophen-caffeine (FIORICET) 50-325-40 MG tablet Take 1 tablet by mouth daily as needed. 04/03/19   [provider]  cloNIDine (CATAPRES) 0.1 MG tablet Take 0.1 mg by mouth 2 (two) times daily. 04/29/20   [provider]  cyclobenzaprine (FLEXERIL) 10 MG tablet Take 10 mg by mouth 3 (three) times daily. 03/25/22   [provider]  D3-50 1.25 MG (50000 UT) capsule Take 50,000 Units by mouth once a week. 08/18/20   [provider]  dicyclomine (BENTYL) 10 MG capsule Take 1 capsule (10 mg total) by mouth 3 (three) times daily as needed for  spasms. 03/03/22   Minna Antis, MD  esomeprazole (NEXIUM) 40 MG capsule Take 40 mg by mouth daily at 12 noon.    [provider]  famotidine (PEPCID) 40 MG tablet Take 40 mg by mouth at bedtime. 06/15/20   [provider]  FLOVENT HFA 220 MCG/ACT inhaler Inhale 2 puffs into the lungs 2 (two) times daily. 08/15/20   [provider]  gabapentin (NEURONTIN) 300 MG capsule Take 1 capsule (300 mg total) by mouth 3 (three) times daily as needed (Pain). 06/19/22 06/19/23  Phineas Semen, MD  levofloxacin (LEVAQUIN) 500 MG tablet Take 1 tablet (500 mg total) by mouth daily. 05/24/22   Michiel Cowboy A, PA-C  Meth-Hyo-M Bl-Na Phos-Ph Sal (URIBEL) 118 MG CAPS Take 1 capsule (118 mg total) by mouth every 6 (six) hours as needed. 05/24/22   Michiel Cowboy A, PA-C  metoCLOPramide (REGLAN) 10 MG tablet Take 10 mg by mouth 3 (three) times daily as needed. 06/04/20   [provider]  metoprolol succinate (TOPROL-XL) 100 MG 24 hr tablet Take 100 mg by mouth daily. 01/28/22   [provider]  ondansetron (ZOFRAN) 4 MG tablet Take 1 tablet (4 mg total) by mouth every 4 (four) hours as needed for nausea or vomiting. 04/20/22   Sloan Leiter, DO  PROAIR HFA 108 [phone] Base) MCG/ACT inhaler Inhale 1-2 puffs into the lungs as needed. 03/20/20   [provider]  promethazine (PHENERGAN) 25 MG tablet Take 25 mg by mouth every 6 (six) hours as needed for nausea  or vomiting.    [provider]  QUEtiapine (SEROQUEL) 100 MG tablet Take 100 mg by mouth 2 (two) times daily. 02/18/22   [provider]  SUMAtriptan (IMITREX) 100 MG tablet Take 1 tablet (100 mg total) by mouth every 2 (two) hours as needed for migraine. May repeat in 2 hours if headache persists or recurs. 01/27/14   Esperanza Sheets, MD  Vitamin D, Ergocalciferol, (DRISDOL) 1.25 MG (50000 UNIT) CAPS capsule Take 50,000 Units by mouth once a week. 03/13/22   [provider]       Allergies    Ciprofloxacin, Macrobid [nitrofurantoin monohyd macro], and Sulfa antibiotics    Review of Systems   Review of Systems  Unable to perform ROS: Mental status change    Physical Exam Updated Vital Signs BP (!) 186/100 (BP Location: Left Arm)   Pulse (!) 104   Temp (!) 97.5 F (36.4 C) (Axillary)   Resp 18   SpO2 100%  Physical Exam Vitals and nursing note reviewed.  Constitutional:      General: She is not in acute distress.    Appearance: She is well-developed. She is not diaphoretic.  HENT:     Head: Normocephalic and atraumatic.  Cardiovascular:     Rate and Rhythm: Normal rate and regular rhythm.     Heart sounds: No murmur heard.    No friction rub. No gallop.  Pulmonary:     Effort: Pulmonary effort is normal. No respiratory distress.     Breath sounds: Normal breath sounds. No wheezing.  Abdominal:     General: Bowel sounds are normal. There is no distension.     Palpations: Abdomen is soft.     Tenderness: There is no abdominal tenderness.  Musculoskeletal:        General: Normal range of motion.     Cervical back: Normal range of motion and neck supple.  Skin:    General: Skin is warm and dry.  Neurological:     Comments: Patient is awake and alert.  She appears confused and will not respond to questions or commands.  She stares straight ahead and does not make eye contact.  She does move all extremities and there are no obvious deficits with exam being somewhat limited.     ED Results / Procedures / Treatments   Labs (all labs ordered are listed, but only abnormal results are displayed) Labs Reviewed  BASIC METABOLIC PANEL  CBC WITH DIFFERENTIAL/PLATELET  SALICYLATE LEVEL  ACETAMINOPHEN LEVEL  ETHANOL  URINALYSIS, ROUTINE W REFLEX MICROSCOPIC  RAPID URINE DRUG SCREEN, HOSP PERFORMED    EKG EKG Interpretation  Date/Time:  Monday June 20 2022 23:26:04 EST Ventricular Rate:  103 PR Interval:  184 QRS Duration: 104 QT  Interval:  364 QTC Calculation: 477 R Axis:   63 Text Interpretation: Sinus tachycardia Consider left atrial enlargement Confirmed by Geoffery Lyons ([id number]) on 06/20/2022 11:28:06 PM  Radiology CT Angio Chest PE W and/or Wo Contrast  Result Date: 06/19/2022 CLINICAL DATA:  Abdomen pain EXAM: CT ANGIOGRAPHY CHEST CT ABDOMEN AND PELVIS WITH CONTRAST TECHNIQUE: Multidetector CT imaging of the chest was performed using the standard protocol during bolus administration of intravenous contrast. Multiplanar CT image reconstructions and MIPs were obtained to evaluate the vascular anatomy. Multidetector CT imaging of the abdomen and pelvis was performed using the standard protocol during bolus administration of intravenous contrast. RADIATION DOSE REDUCTION: This exam was performed according to the departmental dose-optimization program which includes automated exposure control, adjustment  of the mA and/or kV according to patient size and/or use of iterative reconstruction technique. CONTRAST:  OMNIPAQUE IOHEXOL 350 MG/ML SOLN COMPARISON:  CT 04/26/2022, 04/20/2022, chest CT 08/12/2021 FINDINGS: CTA CHEST FINDINGS Cardiovascular: Satisfactory opacification of the pulmonary arteries to the segmental level. No evidence of pulmonary embolism. Mild aortic atherosclerosis. No aneurysm. No dissection. Mild coronary vascular calcification. Normal cardiac size. No sizable pericardial effusion. Mediastinum/Nodes: Midline trachea. No thyroid mass. Multiple enlarged mediastinal and hilar nodes. Prevascular node measures 13 mm, series 4, image 36. Paratracheal nodes measuring up to 14 mm. Precarinal node measures 17 mm, series 4, image 59. Right inferior hilar node its measuring up to 17 mm, previously 14 mm. Subcarinal lymph node measures 18 mm. Esophagus within normal limits. Small hiatal hernia Lungs/Pleura: Emphysema. No acute airspace disease, pleural effusion, or pneumothorax. Musculoskeletal: Sternum is intact.  No  acute osseous abnormality. Review of the MIP images confirms the above findings. CT ABDOMEN and PELVIS FINDINGS Hepatobiliary: Status post cholecystectomy. No focal hepatic abnormality or biliary dilatation. Pancreas: Unremarkable. No pancreatic ductal dilatation or surrounding inflammatory changes. Spleen: Normal in size without focal abnormality. Adrenals/Urinary Tract: Adrenal glands are unremarkable. Kidneys are normal, without renal calculi, focal lesion, or hydronephrosis. Bladder is decompressed by Foley catheter. Stomach/Bowel: Stomach is within normal limits. Appendix not well seen but no right lower quadrant inflammation. No evidence of bowel wall thickening, distention, or inflammatory changes. Vascular/Lymphatic: Advanced aortic atherosclerosis. No aneurysm. No suspicious lymph nodes. Reproductive: Status post hysterectomy. No adnexal masses. Other: Negative for pelvic effusion or free air. Musculoskeletal: No acute osseous abnormality. Grade 1 anterolisthesis L4 on L5 with degenerative changes. Review of the MIP images confirms the above findings. IMPRESSION: 1. Negative for acute pulmonary embolus or aortic dissection. Emphysema without acute airspace disease. 2. No CT evidence for acute intra-abdominal or pelvic abnormality. 3. Slight interval increase in mediastinal and right hilar adenopathy compared to prior chest CT, correlate for any history of systemic or lymphoproliferative disease. 4. Aortic atherosclerosis. Aortic Atherosclerosis (ICD10-I70.0) and Emphysema (ICD10-J43.9). Electronically Signed   By: Jasmine Pang M.D.   On: 06/19/2022 17:21   CT ABDOMEN PELVIS W CONTRAST  Result Date: 06/19/2022 CLINICAL DATA:  Abdomen pain EXAM: CT ANGIOGRAPHY CHEST CT ABDOMEN AND PELVIS WITH CONTRAST TECHNIQUE: Multidetector CT imaging of the chest was performed using the standard protocol during bolus administration of intravenous contrast. Multiplanar CT image reconstructions and MIPs were obtained  to evaluate the vascular anatomy. Multidetector CT imaging of the abdomen and pelvis was performed using the standard protocol during bolus administration of intravenous contrast. RADIATION DOSE REDUCTION: This exam was performed according to the departmental dose-optimization program which includes automated exposure control, adjustment of the mA and/or kV according to patient size and/or use of iterative reconstruction technique. CONTRAST:  OMNIPAQUE IOHEXOL 350 MG/ML SOLN COMPARISON:  CT 04/26/2022, 04/20/2022, chest CT 08/12/2021 FINDINGS: CTA CHEST FINDINGS Cardiovascular: Satisfactory opacification of the pulmonary arteries to the segmental level. No evidence of pulmonary embolism. Mild aortic atherosclerosis. No aneurysm. No dissection. Mild coronary vascular calcification. Normal cardiac size. No sizable pericardial effusion. Mediastinum/Nodes: Midline trachea. No thyroid mass. Multiple enlarged mediastinal and hilar nodes. Prevascular node measures 13 mm, series 4, image 36. Paratracheal nodes measuring up to 14 mm. Precarinal node measures 17 mm, series 4, image 59. Right inferior hilar node its measuring up to 17 mm, previously 14 mm. Subcarinal lymph node measures 18 mm. Esophagus within normal limits. Small hiatal hernia Lungs/Pleura: Emphysema. No acute airspace disease, pleural effusion,  or pneumothorax. Musculoskeletal: Sternum is intact.  No acute osseous abnormality. Review of the MIP images confirms the above findings. CT ABDOMEN and PELVIS FINDINGS Hepatobiliary: Status post cholecystectomy. No focal hepatic abnormality or biliary dilatation. Pancreas: Unremarkable. No pancreatic ductal dilatation or surrounding inflammatory changes. Spleen: Normal in size without focal abnormality. Adrenals/Urinary Tract: Adrenal glands are unremarkable. Kidneys are normal, without renal calculi, focal lesion, or hydronephrosis. Bladder is decompressed by Foley catheter. Stomach/Bowel: Stomach is within  normal limits. Appendix not well seen but no right lower quadrant inflammation. No evidence of bowel wall thickening, distention, or inflammatory changes. Vascular/Lymphatic: Advanced aortic atherosclerosis. No aneurysm. No suspicious lymph nodes. Reproductive: Status post hysterectomy. No adnexal masses. Other: Negative for pelvic effusion or free air. Musculoskeletal: No acute osseous abnormality. Grade 1 anterolisthesis L4 on L5 with degenerative changes. Review of the MIP images confirms the above findings. IMPRESSION: 1. Negative for acute pulmonary embolus or aortic dissection. Emphysema without acute airspace disease. 2. No CT evidence for acute intra-abdominal or pelvic abnormality. 3. Slight interval increase in mediastinal and right hilar adenopathy compared to prior chest CT, correlate for any history of systemic or lymphoproliferative disease. 4. Aortic atherosclerosis. Aortic Atherosclerosis (ICD10-I70.0) and Emphysema (ICD10-J43.9). Electronically Signed   By: Jasmine Pang M.D.   On: 06/19/2022 17:21    Procedures Procedures    Medications Ordered in ED Medications - No data to display  ED Course/ Medical Decision Making/ A&P  Patient is a 62 year old female with past medical history as per HPI presenting with altered mental status.  Patient seen at [facility] yesterday and diagnosed with urinary retention.  She was discharged with a Foley catheter.  This evening she became altered and was sent here for evaluation.  Patient has a little additional history secondary to altered mental status.  She arrives here with stable vital signs and is afebrile.  Physical examination reveals a disoriented 62 year old who offers little history and will not follow commands.  She does move all extremities and physical examination is otherwise unremarkable.  Workup initiated including CBC, metabolic panel, urinalysis.  Studies have returned with a sodium of 115 and potassium of 2.1.  Normal saline bolus  initiated along with intravenous potassium.  CT scan of the hip was attempted, however patient began to vomit while in radiology, so the study was postponed.  Patient to be admitted to the hospitalist service for correction of her electrolytes and further investigation into the cause of her altered mental status.  Final Clinical Impression(s) / ED Diagnoses Final diagnoses:  None    Rx / DC Orders ED Discharge Orders     None         Geoffery Lyons, MD 06/21/22 [phone]

## 2022-06-21 ENCOUNTER — Emergency Department (HOSPITAL_COMMUNITY): Payer: Medicaid Other

## 2022-06-21 ENCOUNTER — Inpatient Hospital Stay (HOSPITAL_COMMUNITY): Payer: Medicaid Other

## 2022-06-21 ENCOUNTER — Encounter (HOSPITAL_COMMUNITY): Payer: Self-pay | Admitting: Internal Medicine

## 2022-06-21 DIAGNOSIS — Z8249 Family history of ischemic heart disease and other diseases of the circulatory system: Secondary | ICD-10-CM | POA: Diagnosis not present

## 2022-06-21 DIAGNOSIS — Z841 Family history of disorders of kidney and ureter: Secondary | ICD-10-CM | POA: Diagnosis not present

## 2022-06-21 DIAGNOSIS — E876 Hypokalemia: Secondary | ICD-10-CM | POA: Diagnosis present

## 2022-06-21 DIAGNOSIS — E871 Hypo-osmolality and hyponatremia: Secondary | ICD-10-CM | POA: Diagnosis present

## 2022-06-21 DIAGNOSIS — K219 Gastro-esophageal reflux disease without esophagitis: Secondary | ICD-10-CM | POA: Diagnosis present

## 2022-06-21 DIAGNOSIS — J449 Chronic obstructive pulmonary disease, unspecified: Secondary | ICD-10-CM | POA: Diagnosis present

## 2022-06-21 DIAGNOSIS — F411 Generalized anxiety disorder: Secondary | ICD-10-CM | POA: Diagnosis present

## 2022-06-21 DIAGNOSIS — Z1152 Encounter for screening for COVID-19: Secondary | ICD-10-CM | POA: Diagnosis not present

## 2022-06-21 DIAGNOSIS — K589 Irritable bowel syndrome without diarrhea: Secondary | ICD-10-CM | POA: Diagnosis present

## 2022-06-21 DIAGNOSIS — E7800 Pure hypercholesterolemia, unspecified: Secondary | ICD-10-CM

## 2022-06-21 DIAGNOSIS — F17210 Nicotine dependence, cigarettes, uncomplicated: Secondary | ICD-10-CM | POA: Diagnosis present

## 2022-06-21 DIAGNOSIS — F32A Depression, unspecified: Secondary | ICD-10-CM | POA: Diagnosis present

## 2022-06-21 DIAGNOSIS — G9340 Encephalopathy, unspecified: Secondary | ICD-10-CM | POA: Diagnosis not present

## 2022-06-21 DIAGNOSIS — I5032 Chronic diastolic (congestive) heart failure: Secondary | ICD-10-CM | POA: Diagnosis not present

## 2022-06-21 DIAGNOSIS — Z79899 Other long term (current) drug therapy: Secondary | ICD-10-CM | POA: Diagnosis not present

## 2022-06-21 DIAGNOSIS — Z888 Allergy status to other drugs, medicaments and biological substances status: Secondary | ICD-10-CM | POA: Diagnosis not present

## 2022-06-21 DIAGNOSIS — T424X5A Adverse effect of benzodiazepines, initial encounter: Secondary | ICD-10-CM | POA: Diagnosis present

## 2022-06-21 DIAGNOSIS — J441 Chronic obstructive pulmonary disease with (acute) exacerbation: Secondary | ICD-10-CM | POA: Diagnosis present

## 2022-06-21 DIAGNOSIS — E785 Hyperlipidemia, unspecified: Secondary | ICD-10-CM | POA: Diagnosis present

## 2022-06-21 DIAGNOSIS — I110 Hypertensive heart disease with heart failure: Secondary | ICD-10-CM | POA: Diagnosis present

## 2022-06-21 DIAGNOSIS — J432 Centrilobular emphysema: Secondary | ICD-10-CM

## 2022-06-21 DIAGNOSIS — T423X5A Adverse effect of barbiturates, initial encounter: Secondary | ICD-10-CM | POA: Diagnosis present

## 2022-06-21 DIAGNOSIS — Z882 Allergy status to sulfonamides status: Secondary | ICD-10-CM | POA: Diagnosis not present

## 2022-06-21 DIAGNOSIS — E222 Syndrome of inappropriate secretion of antidiuretic hormone: Secondary | ICD-10-CM | POA: Diagnosis present

## 2022-06-21 DIAGNOSIS — E861 Hypovolemia: Secondary | ICD-10-CM | POA: Diagnosis not present

## 2022-06-21 DIAGNOSIS — R32 Unspecified urinary incontinence: Secondary | ICD-10-CM | POA: Diagnosis present

## 2022-06-21 DIAGNOSIS — Z720 Tobacco use: Secondary | ICD-10-CM | POA: Diagnosis present

## 2022-06-21 DIAGNOSIS — I10 Essential (primary) hypertension: Secondary | ICD-10-CM

## 2022-06-21 DIAGNOSIS — R338 Other retention of urine: Secondary | ICD-10-CM

## 2022-06-21 DIAGNOSIS — G928 Other toxic encephalopathy: Secondary | ICD-10-CM | POA: Diagnosis present

## 2022-06-21 DIAGNOSIS — Y929 Unspecified place or not applicable: Secondary | ICD-10-CM | POA: Diagnosis not present

## 2022-06-21 DIAGNOSIS — R339 Retention of urine, unspecified: Secondary | ICD-10-CM | POA: Diagnosis present

## 2022-06-21 HISTORY — DX: Hypomagnesemia: E83.42

## 2022-06-21 LAB — BASIC METABOLIC PANEL
Anion gap: 12 (ref 5–15)
Anion gap: 10 (ref 5–15)
Anion gap: 12 (ref 5–15)
Anion gap: 8 (ref 5–15)
BUN: <5 mg/dL — ABNORMAL LOW (ref 8–23)
BUN: <5 mg/dL — ABNORMAL LOW (ref 8–23)
BUN: 6 mg/dL — ABNORMAL LOW (ref 8–23)
BUN: 5 mg/dL — ABNORMAL LOW (ref 8–23)
CO2: 25 mmol/L (ref 22–32)
CO2: 22 mmol/L (ref 22–32)
CO2: 20 mmol/L — ABNORMAL LOW (ref 22–32)
CO2: 21 mmol/L — ABNORMAL LOW (ref 22–32)
Calcium: 8.2 mg/dL — ABNORMAL LOW (ref 8.9–10.3)
Calcium: 8.3 mg/dL — ABNORMAL LOW (ref 8.9–10.3)
Calcium: 7.7 mg/dL — ABNORMAL LOW (ref 8.9–10.3)
Calcium: 7.8 mg/dL — ABNORMAL LOW (ref 8.9–10.3)
Chloride: 78 mmol/L — ABNORMAL LOW (ref 98–111)
Chloride: 82 mmol/L — ABNORMAL LOW (ref 98–111)
Chloride: 84 mmol/L — ABNORMAL LOW (ref 98–111)
Chloride: 89 mmol/L — ABNORMAL LOW (ref 98–111)
Creatinine, Ser: 0.52 mg/dL (ref 0.44–1.00)
Creatinine, Ser: 0.57 mg/dL (ref 0.44–1.00)
Creatinine, Ser: 0.54 mg/dL (ref 0.44–1.00)
Creatinine, Ser: 0.45 mg/dL (ref 0.44–1.00)
GFR, Estimated: >60 mL/min (ref >60)
GFR, Estimated: >60 mL/min (ref >60)
GFR, Estimated: >60 mL/min (ref >60)
GFR, Estimated: >60 mL/min (ref >60)
Glucose, Bld: 121 mg/dL — ABNORMAL HIGH (ref 70–99)
Glucose, Bld: 132 mg/dL — ABNORMAL HIGH (ref 70–99)
Glucose, Bld: 111 mg/dL — ABNORMAL HIGH (ref 70–99)
Glucose, Bld: 107 mg/dL — ABNORMAL HIGH (ref 70–99)
Potassium: 2.1 mmol/L — CL (ref 3.5–5.1)
Potassium: 3.2 mmol/L — ABNORMAL LOW (ref 3.5–5.1)
Potassium: 2.8 mmol/L — ABNORMAL LOW (ref 3.5–5.1)
Potassium: 2.4 mmol/L — CL (ref 3.5–5.1)
Sodium: 115 mmol/L — CL (ref 135–145)
Sodium: 114 mmol/L — CL (ref 135–145)
Sodium: 116 mmol/L — CL (ref 135–145)
Sodium: 118 mmol/L — CL (ref 135–145)

## 2022-06-21 LAB — COMPREHENSIVE METABOLIC PANEL
ALT: 27 U/L (ref 0–44)
AST: 39 U/L (ref 15–41)
Albumin: 3.7 g/dL (ref 3.5–5.0)
Alkaline Phosphatase: 83 U/L (ref 38–126)
Anion gap: 12 (ref 5–15)
BUN: <5 mg/dL — ABNORMAL LOW (ref 8–23)
CO2: 23 mmol/L (ref 22–32)
Calcium: 8.4 mg/dL — ABNORMAL LOW (ref 8.9–10.3)
Chloride: 77 mmol/L — ABNORMAL LOW (ref 98–111)
Creatinine, Ser: 0.6 mg/dL (ref 0.44–1.00)
GFR, Estimated: >60 mL/min (ref >60)
Glucose, Bld: 131 mg/dL — ABNORMAL HIGH (ref 70–99)
Potassium: 3.3 mmol/L — ABNORMAL LOW (ref 3.5–5.1)
Sodium: 112 mmol/L — CL (ref 135–145)
Total Bilirubin: 0.8 mg/dL (ref 0.3–1.2)
Total Protein: 6.9 g/dL (ref 6.5–8.1)

## 2022-06-21 LAB — URINALYSIS, ROUTINE W REFLEX MICROSCOPIC
Bilirubin Urine: NEGATIVE
Glucose, UA: 150 mg/dL — AB
Ketones, ur: NEGATIVE mg/dL
Leukocytes,Ua: NEGATIVE
Nitrite: NEGATIVE
Protein, ur: NEGATIVE mg/dL
Specific Gravity, Urine: 1.009 (ref 1.005–1.030)
pH: 7 (ref 5.0–8.0)

## 2022-06-21 LAB — CBC WITH DIFFERENTIAL/PLATELET
Abs Immature Granulocytes: 0.06 10*3/uL (ref 0.00–0.07)
Abs Immature Granulocytes: 0.06 10*3/uL (ref 0.00–0.07)
Basophils Absolute: 0 10*3/uL (ref 0.0–0.1)
Basophils Absolute: 0 10*3/uL (ref 0.0–0.1)
Basophils Relative: 0 %
Basophils Relative: 0 %
Eosinophils Absolute: 0.2 10*3/uL (ref 0.0–0.5)
Eosinophils Absolute: 0 10*3/uL (ref 0.0–0.5)
Eosinophils Relative: 2 %
Eosinophils Relative: 0 %
HCT: 37.2 % (ref 36.0–46.0)
HCT: 37.6 % (ref 36.0–46.0)
Hemoglobin: 13.2 g/dL (ref 12.0–15.0)
Hemoglobin: 13.2 g/dL (ref 12.0–15.0)
Immature Granulocytes: 1 %
Immature Granulocytes: 1 %
Lymphocytes Relative: 9 %
Lymphocytes Relative: 6 %
Lymphs Abs: 1 10*3/uL (ref 0.7–4.0)
Lymphs Abs: 0.8 10*3/uL (ref 0.7–4.0)
MCH: 30.2 pg (ref 26.0–34.0)
MCH: 30 pg (ref 26.0–34.0)
MCHC: 35.5 g/dL (ref 30.0–36.0)
MCHC: 35.1 g/dL (ref 30.0–36.0)
MCV: 85.1 fL (ref 80.0–100.0)
MCV: 85.5 fL (ref 80.0–100.0)
Monocytes Absolute: 0.8 10*3/uL (ref 0.1–1.0)
Monocytes Absolute: 1.1 10*3/uL — ABNORMAL HIGH (ref 0.1–1.0)
Monocytes Relative: 7 %
Monocytes Relative: 9 %
Neutro Abs: 8.4 10*3/uL — ABNORMAL HIGH (ref 1.7–7.7)
Neutro Abs: 10.8 10*3/uL — ABNORMAL HIGH (ref 1.7–7.7)
Neutrophils Relative %: 81 %
Neutrophils Relative %: 84 %
Platelets: 192 10*3/uL (ref 150–400)
Platelets: 207 10*3/uL (ref 150–400)
RBC: 4.37 MIL/uL (ref 3.87–5.11)
RBC: 4.4 MIL/uL (ref 3.87–5.11)
RDW: 11.8 % (ref 11.5–15.5)
RDW: 11.5 % (ref 11.5–15.5)
WBC: 10.4 10*3/uL (ref 4.0–10.5)
WBC: 12.8 10*3/uL — ABNORMAL HIGH (ref 4.0–10.5)
nRBC: 0 % (ref 0.0–0.2)
nRBC: 0 % (ref 0.0–0.2)

## 2022-06-21 LAB — BRAIN NATRIURETIC PEPTIDE: B Natriuretic Peptide: 131.5 pg/mL — ABNORMAL HIGH (ref 0.0–100.0)

## 2022-06-21 LAB — SALICYLATE LEVEL: Salicylate Lvl: <7 mg/dL — ABNORMAL LOW (ref 7.0–30.0)

## 2022-06-21 LAB — RAPID URINE DRUG SCREEN, HOSP PERFORMED
Amphetamines: NOT DETECTED
Barbiturates: POSITIVE — AB
Benzodiazepines: POSITIVE — AB
Cocaine: NOT DETECTED
Opiates: NOT DETECTED
Tetrahydrocannabinol: NOT DETECTED

## 2022-06-21 LAB — MAGNESIUM: Magnesium: 1.3 mg/dL — ABNORMAL LOW (ref 1.7–2.4)

## 2022-06-21 LAB — ETHANOL: Alcohol, Ethyl (B): <10 mg/dL (ref <10)

## 2022-06-21 LAB — ACETAMINOPHEN LEVEL: Acetaminophen (Tylenol), Serum: <10 ug/mL — ABNORMAL LOW (ref 10–30)

## 2022-06-21 LAB — TSH: TSH: 0.703 u[IU]/mL (ref 0.350–4.500)

## 2022-06-21 LAB — PROCALCITONIN: Procalcitonin: <0.1 ng/mL

## 2022-06-21 LAB — CK: Total CK: 913 U/L — ABNORMAL HIGH (ref 38–234)

## 2022-06-21 MED ORDER — HYDRALAZINE HCL 20 MG/ML IJ SOLN
5.0000 mg | Freq: Four times a day (QID) | INTRAMUSCULAR | Status: DC | PRN
  Administered 2022-06-24: 5 mg via INTRAVENOUS
  Filled 2022-06-21: qty 1

## 2022-06-21 MED ORDER — ALBUTEROL SULFATE (2.5 MG/3ML) 0.083% IN NEBU
2.5000 mg | INHALATION_SOLUTION | RESPIRATORY_TRACT | Status: DC | PRN
  Administered 2022-06-22: 2.5 mg via RESPIRATORY_TRACT
  Filled 2022-06-21: qty 3

## 2022-06-21 MED ORDER — HEPARIN SODIUM (PORCINE) 5000 UNIT/ML IJ SOLN
5000.0000 [IU] | Freq: Three times a day (TID) | INTRAMUSCULAR | Status: DC
  Administered 2022-06-21 – 2022-06-24 (×8): 5000 [IU] via SUBCUTANEOUS
  Filled 2022-06-21 (×8): qty 1

## 2022-06-21 MED ORDER — POTASSIUM CHLORIDE IN NACL 40-0.9 MEQ/L-% IV SOLN
INTRAVENOUS | Status: AC
  Filled 2022-06-21 (×3): qty 1000

## 2022-06-21 MED ORDER — LORAZEPAM 2 MG/ML IJ SOLN
0.5000 mg | INTRAMUSCULAR | Status: AC
  Administered 2022-06-21: 0.5 mg via INTRAVENOUS
  Filled 2022-06-21: qty 1

## 2022-06-21 MED ORDER — POTASSIUM CHLORIDE 10 MEQ/100ML IV SOLN
10.0000 meq | Freq: Once | INTRAVENOUS | Status: DC
  Filled 2022-06-21: qty 100

## 2022-06-21 MED ORDER — SODIUM CHLORIDE 0.9 % IV BOLUS
1000.0000 mL | Freq: Once | INTRAVENOUS | Status: AC
  Administered 2022-06-21: 1000 mL via INTRAVENOUS

## 2022-06-21 MED ORDER — BUDESONIDE 0.5 MG/2ML IN SUSP
0.5000 mg | Freq: Two times a day (BID) | RESPIRATORY_TRACT | Status: DC
  Administered 2022-06-22 – 2022-06-24 (×4): 0.5 mg via RESPIRATORY_TRACT
  Filled 2022-06-21 (×5): qty 2

## 2022-06-21 MED ORDER — NICOTINE 14 MG/24HR TD PT24
14.0000 mg | MEDICATED_PATCH | Freq: Every day | TRANSDERMAL | Status: DC | PRN
  Administered 2022-06-22: 14 mg via TRANSDERMAL
  Filled 2022-06-21: qty 1

## 2022-06-21 MED ORDER — POTASSIUM CHLORIDE 10 MEQ/100ML IV SOLN
10.0000 meq | Freq: Once | INTRAVENOUS | Status: AC
  Administered 2022-06-21: 10 meq via INTRAVENOUS
  Filled 2022-06-21: qty 100

## 2022-06-21 MED ORDER — ACETAMINOPHEN 650 MG RE SUPP
650.0000 mg | Freq: Four times a day (QID) | RECTAL | Status: DC | PRN
  Administered 2022-06-21: 650 mg via RECTAL
  Filled 2022-06-21: qty 1

## 2022-06-21 MED ORDER — ACETAMINOPHEN 325 MG PO TABS
650.0000 mg | ORAL_TABLET | Freq: Four times a day (QID) | ORAL | Status: DC | PRN
  Administered 2022-06-22 – 2022-06-24 (×5): 650 mg via ORAL
  Filled 2022-06-21 (×5): qty 2

## 2022-06-21 MED ORDER — POTASSIUM CHLORIDE 10 MEQ/100ML IV SOLN
10.0000 meq | INTRAVENOUS | Status: AC
  Administered 2022-06-21 – 2022-06-22 (×2): 10 meq via INTRAVENOUS

## 2022-06-21 MED ORDER — MAGNESIUM SULFATE 2 GM/50ML IV SOLN
2.0000 g | Freq: Once | INTRAVENOUS | Status: AC
  Administered 2022-06-21: 2 g via INTRAVENOUS
  Filled 2022-06-21: qty 50

## 2022-06-21 MED ORDER — PIPERACILLIN-TAZOBACTAM 3.375 G IVPB
3.3750 g | Freq: Three times a day (TID) | INTRAVENOUS | Status: DC
  Administered 2022-06-21 – 2022-06-23 (×5): 3.375 g via INTRAVENOUS
  Filled 2022-06-21 (×5): qty 50

## 2022-06-21 MED ORDER — ONDANSETRON HCL 4 MG/2ML IJ SOLN
4.0000 mg | Freq: Four times a day (QID) | INTRAMUSCULAR | Status: DC | PRN
  Administered 2022-06-24: 4 mg via INTRAVENOUS
  Filled 2022-06-21: qty 2

## 2022-06-21 MED ORDER — SODIUM CHLORIDE 0.9 % IV SOLN
INTRAVENOUS | Status: DC
  Administered 2022-06-21: 1000 mL via INTRAVENOUS

## 2022-06-21 MED ORDER — PIPERACILLIN-TAZOBACTAM 3.375 G IVPB 30 MIN
3.3750 g | Freq: Once | INTRAVENOUS | Status: AC
  Administered 2022-06-21: 3.375 g via INTRAVENOUS
  Filled 2022-06-21: qty 50

## 2022-06-21 MED ORDER — SODIUM CHLORIDE 0.9 % IV BOLUS
500.0000 mL | Freq: Once | INTRAVENOUS | Status: AC
  Administered 2022-06-21: 500 mL via INTRAVENOUS

## 2022-06-21 MED ORDER — VANCOMYCIN HCL 1250 MG/250ML IV SOLN
1250.0000 mg | INTRAVENOUS | Status: DC
  Administered 2022-06-22: 1250 mg via INTRAVENOUS
  Filled 2022-06-21 (×2): qty 250

## 2022-06-21 MED ORDER — VANCOMYCIN HCL 1500 MG/300ML IV SOLN
1500.0000 mg | Freq: Once | INTRAVENOUS | Status: AC
  Administered 2022-06-21: 1500 mg via INTRAVENOUS
  Filled 2022-06-21: qty 300

## 2022-06-21 MED ORDER — POTASSIUM CHLORIDE 10 MEQ/100ML IV SOLN
10.0000 meq | INTRAVENOUS | Status: AC
  Administered 2022-06-21 (×2): 10 meq via INTRAVENOUS
  Filled 2022-06-21 (×2): qty 100

## 2022-06-21 MED ORDER — ONDANSETRON HCL 4 MG/2ML IJ SOLN
4.0000 mg | Freq: Once | INTRAMUSCULAR | Status: AC
  Administered 2022-06-21: 4 mg via INTRAVENOUS
  Filled 2022-06-21: qty 2

## 2022-06-21 NOTE — ED Notes (Signed)
MD paged for potassium of 2.8

## 2022-06-21 NOTE — ED Notes (Signed)
Critical lab K+ value communicated to MD

## 2022-06-21 NOTE — ED Notes (Signed)
Critical sodium 116.  MD [facility] notified and aware.  No new orders at this time.

## 2022-06-21 NOTE — Progress Notes (Signed)
Pharmacy Antibiotic Note  Katelyn [name] is a 62 y.o. female admitted on 06/20/2022 with encephalopathy.  Pharmacy has been consulted for Vancomycin and Zosyn dosing.  CrCl 70 mL/min  Plan: Vancomycin 1500mg  IV once then 1250mg  Q24H (eAUC 450, Scr 0.8, Vd 0.72) Zosyn 3.375g Q8H Monitor renal function and cultures     Temp (24hrs), Avg:99.6 F (37.6 C), Min:97.5 F (36.4 C), Max:102.5 F (39.2 C)  Recent Labs  Lab 06/19/22 1146 06/21/22 0030 06/21/22 0146 06/21/22 0404  WBC 4.8 10.4  --  12.8*  CREATININE 0.59  --  0.52 0.60    Estimated Creatinine Clearance: 69.1 mL/min (by C-G formula based on SCr of 0.6 mg/dL).    Allergies  Allergen Reactions   Ciprofloxacin Other (See Comments)    Headache   Macrobid [Nitrofurantoin Monohyd Macro] Itching   Sulfa Antibiotics Itching    Antimicrobials this admission: Vancomycin 1/2 >> Zosyn 1/2 >>  Microbiology results: 1/2 Bcx: sent 1/2 MRSA: ordered  Thank you for allowing pharmacy to be a part of this patient's care.  Merrilee Jansky, PharmD Clinical Pharmacist 06/21/2022 12:05 PM

## 2022-06-21 NOTE — H&P (Signed)
History and Physical      Katelyn [name] [id number] DOB: [birth year] DOA: 06/20/2022  PCP: [name], MD  Patient coming from: home   I have personally briefly reviewed patient's old medical records in [place]  Chief Complaint: Altered mental status  HPI: Katelyn [name] is a 62 y.o. female with medical history significant for essential hypertension, hyperlipidemia, COPD, generalized anxiety disorder, chronic tobacco abuse, chronic diastolic heart failure, chronic hyponatremia associated baseline sodium of 1 25-1 29, who is admitted to [facility] on 06/20/2022 with acute encephalopathy after presenting from home to [facility] ED for evaluation of altered mental status.  In the context of the patient's altered mental status, the following history is obtained via my discussions with the EDP in addition to chart review.  The patient had presented to [facility] emergency department on 06/19/2022 with finding of acute urinary retention, at which time Foley catheter was placed, with ensuing output of greater than 1 L urine.  She was socially discharged home from [facility] ED with Foley catheter in place.  However, over the ensuing next day, the patient reportedly developed confusion relative to her baseline mental status, prompting EMS to be called.  EMS reportedly noted the patient to be combative and she received a dose of Versed and route to [facility] emergency department.  Noted to have an episode of nonbloody, nonbilious emesis in the emergency department this evening.  Per chart review, medical history notable for chronic hyponatremia associated baseline serum sodium level of 1 25-1 29.  She also has a history of chronic diastolic heart failure, with most recent echocardiogram performed in November 2022, which demonstrated LVEF 60 to 65%, no focal wall motion normalities, grade 1 diastolic dysfunction, normal right ventricular systolic function, and mild to moderate mitral  regurgitation.  She carries a diagnosis of generalized anxiety disorder as well as depression, and is on scheduled Xanax, scheduled Flexeril, prn gabapentin, quetiapine as an outpatient.  Does not appear to be on a scheduled diuretic medications at home.      ED Course:  Vital signs in the ED were notable for the following: Afebrile; heart rate [id number]; systolic blood pressures in the 150s to 170s; respiratory rate 18-28, oxygen saturation 95 to 100% room air.  Labs were notable for the following: BMP notable for serum sodium of 115 compared to most recent prior sodium level of 123 on 06/19/2022, potassium 2.1 compared to 3.6 on 06/19/2022, chloride 78, bicarbonate 25, creatinine 0.52 compared to 0.59 on 06/19/2022, glucose 121.  Serum magnesium level 1.3.  CBC notable for will blood cell count 10,400, hemoglobin 13.2.  Serum ethanol level less than 10.  Urinalysis notable for no white blood cells, nitrate/leukocyte esterase negative, moderate hemoglobin, 21-50 red blood cells, protein negative.  Urine drug screen was positive for benzodiazepines as well as barbiturates.  Per my interpretation, EKG in ED demonstrated the following: Show sinus tachycardia with heart rate 103, normal intervals, and no evidence of T wave or ST changes, including no evidence of ST elevation.  Imaging and additional notable ED work-up: Noncontrast CT head ordered, with result currently pending.  While in the ED, the following were administered: Normal saline x 1 L bolus, potassium chloride 20 mEq IV, Zofran 4 mg IV x 1.  Subsequently, the patient was admitted for further evaluation management of presenting acute encephalopathy as well as acute on chronic hyponatremia in addition to additional laboratory findings notable for hypokalemia as well as hypomagnesemia.  Review of Systems: As per HPI otherwise 10 point review of systems negative.   Past Medical History:  Diagnosis Date   Anxiety    COPD (chronic  obstructive pulmonary disease) (HCC)    Depressive disorder    GERD (gastroesophageal reflux disease)    Hiatal hernia    HLD (hyperlipidemia)    Hypertension    IBS (irritable bowel syndrome)    IC (interstitial cystitis)    Migraine    Nicotine dependence    Nontoxic goiter    Overactive bladder    Smoking     Past Surgical History:  Procedure Laterality Date   ABDOMINAL HYSTERECTOMY     ADENOIDECTOMY     CHOLECYSTECTOMY     HERNIA REPAIR     NECK SURGERY     SHOULDER SURGERY     TONSILLECTOMY      Social History:  reports that she has been smoking cigarettes. She has been smoking an average of .5 packs per day. She has never used smokeless tobacco. She reports that she does not drink alcohol and does not use drugs.   Allergies  Allergen Reactions   Ciprofloxacin Other (See Comments)    Headache   Macrobid [Nitrofurantoin Monohyd Macro] Itching   Sulfa Antibiotics Itching    Family History  Problem Relation Age of Onset   CAD Father        onset age 2   Heart disease Father    Kidney disease Father    Prostate cancer Father    Colon polyps Maternal Grandmother    Colon cancer Paternal Grandmother    Bladder Cancer Neg Hx    Kidney cancer Neg Hx      Prior to Admission medications   Medication Sig Start Date End Date Taking? Authorizing Provider  ALPRAZolam Duanne Moron) 1 MG tablet Take 1 mg by mouth 3 (three) times daily. 03/22/22   [provider]  amLODipine (NORVASC) 10 MG tablet Take 1 tablet (10 mg total) by mouth daily. 04/15/21   Kate Sable, MD  atorvastatin (LIPITOR) 40 MG tablet Take 40 mg by mouth daily. 01/01/15   [provider]  butalbital-acetaminophen-caffeine (FIORICET) 50-325-40 MG tablet Take 1 tablet by mouth daily as needed. 04/03/19   [provider]  cloNIDine (CATAPRES) 0.1 MG tablet Take 0.1 mg by mouth 2 (two) times daily. 04/29/20   [provider]  cyclobenzaprine (FLEXERIL) 10 MG tablet Take  10 mg by mouth 3 (three) times daily. 03/25/22   [provider]  D3-50 1.25 MG (50000 UT) capsule Take 50,000 Units by mouth once a week. 08/18/20   [provider]  dicyclomine (BENTYL) 10 MG capsule Take 1 capsule (10 mg total) by mouth 3 (three) times daily as needed for spasms. 03/03/22   Harvest Dark, MD  esomeprazole (NEXIUM) 40 MG capsule Take 40 mg by mouth daily at 12 noon.    [provider]  famotidine (PEPCID) 40 MG tablet Take 40 mg by mouth at bedtime. 06/15/20   [provider]  FLOVENT HFA 220 MCG/ACT inhaler Inhale 2 puffs into the lungs 2 (two) times daily. 08/15/20   [provider]  gabapentin (NEURONTIN) 300 MG capsule Take 1 capsule (300 mg total) by mouth 3 (three) times daily as needed (Pain). 06/19/22 06/19/23  Nance Pear, MD  levofloxacin (LEVAQUIN) 500 MG tablet Take 1 tablet (500 mg total) by mouth daily. 05/24/22   Zara Council A, PA-C  Meth-Hyo-M Bl-Na Phos-Ph Sal (URIBEL) 118 MG CAPS Take  1 capsule (118 mg total) by mouth every 6 (six) hours as needed. 05/24/22   Zara Council A, PA-C  metoCLOPramide (REGLAN) 10 MG tablet Take 10 mg by mouth 3 (three) times daily as needed. 06/04/20   [provider]  metoprolol succinate (TOPROL-XL) 100 MG 24 hr tablet Take 100 mg by mouth daily. 01/28/22   [provider]  ondansetron (ZOFRAN) 4 MG tablet Take 1 tablet (4 mg total) by mouth every 4 (four) hours as needed for nausea or vomiting. 04/20/22   Jeanell Sparrow, DO  PROAIR HFA 108 [phone] Base) MCG/ACT inhaler Inhale 1-2 puffs into the lungs as needed. 03/20/20   [provider]  promethazine (PHENERGAN) 25 MG tablet Take 25 mg by mouth every 6 (six) hours as needed for nausea or vomiting.    [provider]  QUEtiapine (SEROQUEL) 100 MG tablet Take 100 mg by mouth 2 (two) times daily. 02/18/22   [provider]  SUMAtriptan (IMITREX) 100 MG tablet Take 1 tablet (100 mg total) by  mouth every 2 (two) hours as needed for migraine. May repeat in 2 hours if headache persists or recurs. 01/27/14   Kinnie Feil, MD  Vitamin D, Ergocalciferol, (DRISDOL) 1.25 MG (50000 UNIT) CAPS capsule Take 50,000 Units by mouth once a week. 03/13/22   [provider]     Objective    Physical Exam: Vitals:   06/21/22 0130 06/21/22 0315 06/21/22 0320 06/21/22 0328  BP: (!) 167/100   (!) 188/100  Pulse: 83 (!) 109  (!) 116  Resp: (!) 24   (!) 30  Temp:   97.8 F (36.6 C)   TempSrc:   Axillary   SpO2: 100% 97%  97%    General: appears to be stated age; confused; unable to follow instructions at this time Skin: warm, dry, no rash Head:  AT/[place] Mouth:  Oral mucosa membranes appear dry, normal dentition Neck: supple; trachea midline Heart:  RRR; did not appreciate any M/R/G Lungs: CTAB, did not appreciate any wheezes, rales, or rhonchi Abdomen: + BS; soft, ND, NT Vascular: 2+ pedal pulses b/l; 2+ radial pulses b/l Extremities: no peripheral edema, no muscle wasting Neuro: In the setting of the patient's current mental status and associated inability to follow instructions, unable to perform full neurologic exam at this time.  As such, assessment of strength, sensation, and cranial nerves is limited at this time. Patient noted to spontaneously move all 4 extremities. No tremors.      Labs on Admission: I have personally reviewed following labs and imaging studies  CBC: Recent Labs  Lab 06/19/22 1146 06/21/22 0030  WBC 4.8 10.4  NEUTROABS  --  8.4*  HGB 13.3 13.2  HCT 38.5 37.2  MCV 86.5 85.1  PLT 197 [id number]   Basic Metabolic Panel: Recent Labs  Lab 06/19/22 1146 06/21/22 0146  NA 123* 115*  K 3.6 2.1*  CL 87* 78*  CO2 29 25  GLUCOSE 106* 121*  BUN 6* <5*  CREATININE 0.59 0.52  CALCIUM 8.7* 8.2*   GFR: Estimated Creatinine Clearance: 69.1 mL/min (by C-G formula based on SCr of 0.52 mg/dL). Liver Function Tests: Recent Labs  Lab 06/19/22 1146   AST 24  ALT 25  ALKPHOS 76  BILITOT 0.2*  PROT 7.2  ALBUMIN 3.8   Recent Labs  Lab 06/19/22 1146  LIPASE 30   No results for input(s): "AMMONIA" in the last 168 hours. Coagulation Profile: No results for input(s): "INR", "PROTIME" in the  last 168 hours. Cardiac Enzymes: No results for input(s): "CKTOTAL", "CKMB", "CKMBINDEX", "TROPONINI" in the last 168 hours. BNP (last 3 results) No results for input(s): "PROBNP" in the last 8760 hours. HbA1C: No results for input(s): "HGBA1C" in the last 72 hours. CBG: No results for input(s): "GLUCAP" in the last 168 hours. Lipid Profile: No results for input(s): "CHOL", "HDL", "LDLCALC", "TRIG", "CHOLHDL", "LDLDIRECT" in the last 72 hours. Thyroid Function Tests: No results for input(s): "TSH", "T4TOTAL", "FREET4", "T3FREE", "THYROIDAB" in the last 72 hours. Anemia Panel: No results for input(s): "VITAMINB12", "FOLATE", "FERRITIN", "TIBC", "IRON", "RETICCTPCT" in the last 72 hours. Urine analysis:    Component Value Date/Time   COLORURINE YELLOW 06/21/2022 0032   APPEARANCEUR CLEAR 06/21/2022 0032   APPEARANCEUR Clear 05/24/2022 1536   LABSPEC 1.009 06/21/2022 0032   PHURINE 7.0 06/21/2022 0032   GLUCOSEU 150 (A) 06/21/2022 0032   HGBUR MODERATE (A) 06/21/2022 0032   BILIRUBINUR NEGATIVE 06/21/2022 0032   BILIRUBINUR Negative 05/24/2022 1536   KETONESUR NEGATIVE 06/21/2022 0032   PROTEINUR NEGATIVE 06/21/2022 0032   UROBILINOGEN 0.2 11/13/2007 2030   NITRITE NEGATIVE 06/21/2022 0032   LEUKOCYTESUR NEGATIVE 06/21/2022 0032    Radiological Exams on Admission: CT Angio Chest PE W and/or Wo Contrast  Result Date: 06/19/2022 CLINICAL DATA:  Abdomen pain EXAM: CT ANGIOGRAPHY CHEST CT ABDOMEN AND PELVIS WITH CONTRAST TECHNIQUE: Multidetector CT imaging of the chest was performed using the standard protocol during bolus administration of intravenous contrast. Multiplanar CT image reconstructions and MIPs were obtained to evaluate the  vascular anatomy. Multidetector CT imaging of the abdomen and pelvis was performed using the standard protocol during bolus administration of intravenous contrast. RADIATION DOSE REDUCTION: This exam was performed according to the departmental dose-optimization program which includes automated exposure control, adjustment of the mA and/or kV according to patient size and/or use of iterative reconstruction technique. CONTRAST:  134mL OMNIPAQUE IOHEXOL 350 MG/ML SOLN COMPARISON:  CT 04/26/2022, 04/20/2022, chest CT 08/12/2021 FINDINGS: CTA CHEST FINDINGS Cardiovascular: Satisfactory opacification of the pulmonary arteries to the segmental level. No evidence of pulmonary embolism. Mild aortic atherosclerosis. No aneurysm. No dissection. Mild coronary vascular calcification. Normal cardiac size. No sizable pericardial effusion. Mediastinum/Nodes: Midline trachea. No thyroid mass. Multiple enlarged mediastinal and hilar nodes. Prevascular node measures 13 mm, series 4, image 36. Paratracheal nodes measuring up to 14 mm. Precarinal node measures 17 mm, series 4, image 59. Right inferior hilar node its measuring up to 17 mm, previously 14 mm. Subcarinal lymph node measures 18 mm. Esophagus within normal limits. Small hiatal hernia Lungs/Pleura: Emphysema. No acute airspace disease, pleural effusion, or pneumothorax. Musculoskeletal: Sternum is intact.  No acute osseous abnormality. Review of the MIP images confirms the above findings. CT ABDOMEN and PELVIS FINDINGS Hepatobiliary: Status post cholecystectomy. No focal hepatic abnormality or biliary dilatation. Pancreas: Unremarkable. No pancreatic ductal dilatation or surrounding inflammatory changes. Spleen: Normal in size without focal abnormality. Adrenals/Urinary Tract: Adrenal glands are unremarkable. Kidneys are normal, without renal calculi, focal lesion, or hydronephrosis. Bladder is decompressed by Foley catheter. Stomach/Bowel: Stomach is within normal limits.  Appendix not well seen but no right lower quadrant inflammation. No evidence of bowel wall thickening, distention, or inflammatory changes. Vascular/Lymphatic: Advanced aortic atherosclerosis. No aneurysm. No suspicious lymph nodes. Reproductive: Status post hysterectomy. No adnexal masses. Other: Negative for pelvic effusion or free air. Musculoskeletal: No acute osseous abnormality. Grade 1 anterolisthesis L4 on L5 with degenerative changes. Review of the MIP images confirms the above findings. IMPRESSION: 1. Negative for acute  pulmonary embolus or aortic dissection. Emphysema without acute airspace disease. 2. No CT evidence for acute intra-abdominal or pelvic abnormality. 3. Slight interval increase in mediastinal and right hilar adenopathy compared to prior chest CT, correlate for any history of systemic or lymphoproliferative disease. 4. Aortic atherosclerosis. Aortic Atherosclerosis (ICD10-I70.0) and Emphysema (ICD10-J43.9). Electronically Signed   By: Donavan Foil M.D.   On: 06/19/2022 17:21   CT ABDOMEN PELVIS W CONTRAST  Result Date: 06/19/2022 CLINICAL DATA:  Abdomen pain EXAM: CT ANGIOGRAPHY CHEST CT ABDOMEN AND PELVIS WITH CONTRAST TECHNIQUE: Multidetector CT imaging of the chest was performed using the standard protocol during bolus administration of intravenous contrast. Multiplanar CT image reconstructions and MIPs were obtained to evaluate the vascular anatomy. Multidetector CT imaging of the abdomen and pelvis was performed using the standard protocol during bolus administration of intravenous contrast. RADIATION DOSE REDUCTION: This exam was performed according to the departmental dose-optimization program which includes automated exposure control, adjustment of the mA and/or kV according to patient size and/or use of iterative reconstruction technique. CONTRAST:  1101mL OMNIPAQUE IOHEXOL 350 MG/ML SOLN COMPARISON:  CT 04/26/2022, 04/20/2022, chest CT 08/12/2021 FINDINGS: CTA CHEST FINDINGS  Cardiovascular: Satisfactory opacification of the pulmonary arteries to the segmental level. No evidence of pulmonary embolism. Mild aortic atherosclerosis. No aneurysm. No dissection. Mild coronary vascular calcification. Normal cardiac size. No sizable pericardial effusion. Mediastinum/Nodes: Midline trachea. No thyroid mass. Multiple enlarged mediastinal and hilar nodes. Prevascular node measures 13 mm, series 4, image 36. Paratracheal nodes measuring up to 14 mm. Precarinal node measures 17 mm, series 4, image 59. Right inferior hilar node its measuring up to 17 mm, previously 14 mm. Subcarinal lymph node measures 18 mm. Esophagus within normal limits. Small hiatal hernia Lungs/Pleura: Emphysema. No acute airspace disease, pleural effusion, or pneumothorax. Musculoskeletal: Sternum is intact.  No acute osseous abnormality. Review of the MIP images confirms the above findings. CT ABDOMEN and PELVIS FINDINGS Hepatobiliary: Status post cholecystectomy. No focal hepatic abnormality or biliary dilatation. Pancreas: Unremarkable. No pancreatic ductal dilatation or surrounding inflammatory changes. Spleen: Normal in size without focal abnormality. Adrenals/Urinary Tract: Adrenal glands are unremarkable. Kidneys are normal, without renal calculi, focal lesion, or hydronephrosis. Bladder is decompressed by Foley catheter. Stomach/Bowel: Stomach is within normal limits. Appendix not well seen but no right lower quadrant inflammation. No evidence of bowel wall thickening, distention, or inflammatory changes. Vascular/Lymphatic: Advanced aortic atherosclerosis. No aneurysm. No suspicious lymph nodes. Reproductive: Status post hysterectomy. No adnexal masses. Other: Negative for pelvic effusion or free air. Musculoskeletal: No acute osseous abnormality. Grade 1 anterolisthesis L4 on L5 with degenerative changes. Review of the MIP images confirms the above findings. IMPRESSION: 1. Negative for acute pulmonary embolus or  aortic dissection. Emphysema without acute airspace disease. 2. No CT evidence for acute intra-abdominal or pelvic abnormality. 3. Slight interval increase in mediastinal and right hilar adenopathy compared to prior chest CT, correlate for any history of systemic or lymphoproliferative disease. 4. Aortic atherosclerosis. Aortic Atherosclerosis (ICD10-I70.0) and Emphysema (ICD10-J43.9). Electronically Signed   By: Donavan Foil M.D.   On: 06/19/2022 17:21      Assessment/Plan   Principal Problem:   Acute encephalopathy Active Problems:   Essential hypertension   Hyperlipidemia   Acute hyponatremia   Hypokalemia   Hypomagnesemia   COPD (chronic obstructive pulmonary disease) (HCC)   Chronic diastolic CHF (congestive heart failure) (HCC)   Urinary incontinence   Tobacco abuse     #) Acute encephalopathy: Confusion relative to baseline mental status over the  course of the last day.  Unclear etiology at this time, but with potential for metabolic contribution in the setting of presenting acute on chronic hyponatremia, as further detailed below.  Acute on chronic hyponatremia in the setting of acute encephalopathy, CT head was ordered by EDP, with result currently pending.  Will also benefit from MRI brain depending upon results of CT head and considering clinical trend.  No overt evidence of seizures at this time.  No overt evidence of underlying infectious process, including urinalysis that was inconsistent with UTI.  But will expand infectious workup to include chest x-ray, as further detailed below.  Additionally, she has a documented history of COPD, and will check VBG to evaluate for any contribution from hypercapnic encephalopathy.  She is also on a high intensity statin at home, and will check CPK level to evaluate for any contribution from rhabdomyolysis.  There is also the potential for pharmacologic contributions, given the presence of several central acting medications on current home  medication list, many of which are scheduled in nature.  Specifically, she is on scheduled Xanax 3 times daily, scheduled Flexeril 3 times daily, quetiapine twice daily, in addition to as needed gabapentin.  Her urinary drug screen is notable, as it was found to be positive for both benzodiazepines, which is not surprising given scheduled outpatient Xanax, but was also found to be positive for barbiturates.    Plan: Follow-up results of CT head, as above.  Further evaluation management of acute on chronic hyponatremia, as further detailed below.  Will keep n.p.o. for now until able to pass nursing bedside swallow screen.  Fall precautions ordered.  Seizure precautions ordered.  Every 4 hours neurochecks.  Chest x-ray, procalcitonin level, ammonia level, INR, CPK level, TSH, VBG.  Repeat CMP/CBC in the morning.  Hold home scheduled Xanax, Flexeril, quetiapine and hold home prn gabapentin for now.              #) Acute on chronic hypo-osmolar hyponatremia: Relative to her baseline serum sodium range of 1 25-1 29, presenting serum sodium slightly lower at 115, which appears to represent a continued downward trend over the last few days, noting serum sodium level of 123 on 06/19/2022.  Etiology not entirely clear at this time.  Clinically, the patient appears mildly dehydrated, noting at least 1 episode of nonbloody, nonbilious emesis, as above, as well as likely diminished oral intake over the course of the last 1 to 2 days as consequence of altered mental status over that timeframe.  Will also assess for underlying SIADH, with increased risk for such given recent diagnosis of acute urinary retention, although it would have been anticipated that a strong contribution from SIADH as a result of acute urinary retention would be improving in the setting of recently placed Foley catheter.  Will also closely monitor for neurologic contributions, with CT head currently pending.  No overt evidence of seizures  at this time.   in general, will provide gentle IV fluids to attend to suspected contribution from dehydration, while further evaluating for any additional contributing factors, including SIADH, as further detailed below.    Plan: monitor strict I's and O's and daily weights.  check  random urine sodium, urine osmolality.  Check serum osmolality to confirm suspected hypoosmolar etiology.  Check TSH. Gentle IVF's in the form of normal saline at 75 cc/h x 8 hours. Check serum uric acid level, as SIADH can be associated with hypouricemia due to hyperuricuria.  Seizure precautions.  Every 4  hours neurochecks.  Follow-up results of CT head.  Every 4 BMPs ordered through 1300 on 06/21/2022.  Check BNP, INR.  Chest x-ray, procalcitonin level.            #) Hypokalemia: Presenting serum potassium level 2.1.  Status post 20 mill equivalents of IV potassium chloride administered in the ED.  This appears confounded by concomitant hypomagnesemia, as further detailed below.  Plan: Additional potassium chloride 20 mill equivalents over 2 hours x 1 dose now.  Further evaluation management of hypomagnesemia, as further detailed below.  Monitor on telemetry.            #) Hypomagnesemia: Presenting serum magnesium level found to be 1.3, notable in the setting of hypokalemia.  Plan: Magnesium sulfate 2 g IV over 2 hours x 1 dose now.  Repeat serum magnesium level in the morning.  Monitor on telemetry.          #) Acute urinary retention: Was diagnosed with this upon presentation to [facility] ED on 06/19/2022 prompting placement of Foley catheter at that time upon which she was discharged to home on the same day.  This is in the context of a previously documented history of urinary incontinence.  It is unclear to me at this time as she had recently undergone initiation of an anticholinergic medication to treat urinary incontinence.  She reportedly has had good urine output following placement of  Foley catheter.  May warrant dedicated abdominal imaging to further assess for any obstructive source.   Plan: Monitor strict I's and O's and daily weights.  Gentle IV fluids, as above.  Repeat CMP in the morning.               #) Essential Hypertension: documented h/o such, with outpatient antihypertensive regimen including amlodipine, oral clonidine, Toprol succinate.  SBP's in the ED today: In the 150s to 170s mmHg, with potential contribution from missed home doses as a consequence of recent fall and altered mental status, although will closely also monitor for any acute neurologic deficits will following for results of CT head, and possibility pursuit of MRI brain.   Plan: Close monitoring of subsequent BP via routine VS. in the setting of current n.p.o. status, holding home antihypertensive medications for now.                #) Hyperlipidemia: documented h/o such. On high intensity atorvastatin as outpatient.  In the setting of presenting altered mental status, will hold home statin for now and check CPK level.  Plan: Hold home statin for now, check CPK level.               #) COPD: Documented history thereof, without clinical evidence of acute exacerbation at this time.   Outpatient respiratory regimen includes the following: Scheduled Flovent as well as as needed albuterol inhaler.   Plan: cont outpatient event. Prn albuterol nebulizer.  In the setting of presenting altered mental status, will also check VBG.            #) Chronic tobacco abuse: Per chart review, it appears that the patient is a chronic, long-term smoker , notable in the setting of her documented history of underlying COPD.    Plan: Anticipate, as patient's mental status improves, counseled on the importance of complete smoking discontinuation.  Order placed for prn nicotine patch for use during this hospitalization.             #) Chronic diastolic heart  failure: documented history of  such, with most recent echocardiogram performed in November 2022, which is notable for LVEF 60 to 65% as well as grade 1 diastolic dysfunction, with additional results as conveyed above. No overt clinical evidence to suggest acutely decompensated heart failure at this time, however, in the setting of acute on chronic hyponatremia, will proceed with chest x-ray and BNP to further evaluate.  Not on any scheduled diuretic medications at home.     Plan: monitor strict I's & O's and daily weights. Repeat CMP in AM.  Chest x-ray, BNP.     DVT prophylaxis: SCD's   Code Status: Full code, presumed in the setting of presenting altered mental status Family Communication: none Disposition Plan: Per Rounding Team Consults called: none;  Admission status: Inpatient     I SPENT GREATER THAN 75  MINUTES IN CLINICAL CARE TIME/MEDICAL DECISION-MAKING IN COMPLETING THIS ADMISSION.      [place] DO [facility]  From [place]   06/21/2022, 3:40 AM

## 2022-06-21 NOTE — Progress Notes (Signed)
PROGRESS NOTE    Katelyn [name]  [id number] DOB: 1960/12/24 DOA: 06/20/2022 PCP: [name], MD   Brief Narrative:  Katelyn [name] is a 62 y.o. female with medical history significant for essential hypertension, hyperlipidemia, COPD, generalized anxiety disorder, chronic tobacco abuse, chronic diastolic heart failure, chronic hyponatremia associated baseline sodium of 125-130, who is admitted to [facility] on 06/20/2022 with acute encephalopathy.  Of note patient previously presented to [facility] on 06/19/2022 with acute urinary retention, Foley catheter placed and subsequently discharged home with plan for urology follow-up.   Over the past 48 hours patient's mental status continued to deteriorate.  Patient noted to have markedly worsening hyponatremia from baseline at 115, downtrending overnight to 112 with worsening mental status.  Patient also UDS positive for benzodiazepines and barbiturates -she does carry a prescription for benzodiazepines.  [facility] called for admission.  Assessment & Plan:   Principal Problem:   Acute encephalopathy Active Problems:   Essential hypertension   Hyperlipidemia   Acute urinary retention   Acute hyponatremia   Hypokalemia   Hypomagnesemia   COPD (chronic obstructive pulmonary disease) (HCC)   Chronic diastolic CHF (congestive heart failure) (HCC)   Urinary incontinence   Tobacco abuse   Acute metabolic encephalopathy Likely secondary to profound hyponatremia Rule out polypharmacy Rule out infection -Unclear etiology for hyponatremia given patient was at baseline just 2 days ago(123), chronically hyponatremic 125-13 -Sodium 115 at intake-Follow repeat labs -Patient began having fevers tachycardia and tachypnea over the past 12 hours, meeting SIRS criteria without overt clear source of infection -Broad-spectrum antibiotics initiated to cover respiratory GI and urinary sources - cultures pending -Procalcitonin negative -UDS positive  for benzos and barbiturates -concern for polypharmacy versus incidental overdose possible although hyponatremia is more likely -hold all sedating medications as possible -especially benzodiazepines  Acute on chronic hyponatremia, likely hypovolemic Hypokalemia Hypomagnesemia -Unclear etiology, continue aggressive IV fluids as above -Sodium level 115 at intake >>112>114 -Replete Mg/K per repeat labs -Continue every 4 hours BMP, if sodium does not continue to improve may require 3% saline in the ICU  Subacute urinary retention Recent foley 12/31 placement at [facility] Foley catheter no signs or symptoms of obstruction at this time  Chronic diastolic heart failure, not in acute exacerbation; EF 60-65% -Currently hypovolemic, continue fluids as above, may not tolerate aggressive IV fluids as above, if signs or symptoms of hypervolemia would likely need to transition to 3% saline  Hypertension, essential Hyperlipidemia -Initially hypertensive in the setting of agitation but currently normotensive off home medications -Monitor closely for hypotension in the setting of suspected infection as above  COPD w/o exacerbation Chronic tobacco use/abuse -Not in acute exacerbation, currently on room air, smoking cessation education once patient is more awake alert oriented   DVT prophylaxis: Heparin Code Status: Full Family Communication: None present  Status is: Inpatient  Dispo: The patient is from: Home              Anticipated d/c is to: To be determined              Anticipated d/c date is: 48 to 72 hours              Patient currently not medically stable for discharge given ongoing mental status changes profound hyponatremia and presumed infection  Consultants:  None  Procedures:  None  Antimicrobials:  Vancomycin, Zosyn -de-escalate pending cultures  Subjective: No acute issues or events overnight, patient agitated - unable to appropriately orient or  respond to questions  -review of systems markedly limited  Objective: Vitals:   06/21/22 0515 06/21/22 0545 06/21/22 0615 06/21/22 0732  BP: (!) 171/89 (!) 180/95 (!) 178/99   Pulse: 93 (!) 113 (!) 111   Resp: (!) 29 (!) 24 (!) 33   Temp:    98 F (36.7 C)  TempSrc:    Oral  SpO2: 98% 96% 96%     Intake/Output Summary (Last 24 hours) at 06/21/2022 0800 Last data filed at 06/21/2022 0620 Gross per 24 hour  Intake 1200 ml  Output --  Net 1200 ml   There were no vitals filed for this visit.  Examination:  General: Patient agitated in bed, cannot orient, appears to be speaking aloud (unintelligible) with no one in the room HEENT:  Normocephalic atraumatic.  Sclerae nonicteric, noninjected.  Extraocular movements intact bilaterally. Neck:  Without mass or deformity.  Trachea is midline. Lungs:  Clear to auscultate bilaterally without rhonchi, wheeze, or rales. Heart: Tachycardic without murmurs rubs or gallops Abdomen:  Soft, nontender, nondistended.  Without guarding or rebound. Extremities: Without cyanosis, clubbing, edema, or obvious deformity. Vascular:  Dorsalis pedis and posterior tibial pulses palpable bilaterally. Skin:  Warm and dry, no erythema,  Data Reviewed: I have personally reviewed following labs and imaging studies  CBC: Recent Labs  Lab 06/19/22 1146 06/21/22 0030 06/21/22 0404  WBC 4.8 10.4 12.8*  NEUTROABS  --  8.4* 10.8*  HGB 13.3 13.2 13.2  HCT 38.5 37.2 37.6  MCV 86.5 85.1 85.5  PLT 197 192 081   Basic Metabolic Panel: Recent Labs  Lab 06/19/22 1146 06/21/22 0146 06/21/22 0404  NA 123* 115* 112*  K 3.6 2.1* 3.3*  CL 87* 78* 77*  CO2 29 25 23   GLUCOSE 106* 121* 131*  BUN 6* <5* <5*  CREATININE 0.59 0.52 0.60  CALCIUM 8.7* 8.2* 8.4*  MG  --   --  1.3*   GFR: Estimated Creatinine Clearance: 69.1 mL/min (by C-G formula based on SCr of 0.6 mg/dL). Liver Function Tests: Recent Labs  Lab 06/19/22 1146 06/21/22 0404  AST 24 39  ALT 25 27  ALKPHOS 76 83   BILITOT 0.2* 0.8  PROT 7.2 6.9  ALBUMIN 3.8 3.7   Recent Labs  Lab 06/19/22 1146  LIPASE 30   No results for input(s): "AMMONIA" in the last 168 hours. Coagulation Profile: No results for input(s): "INR", "PROTIME" in the last 168 hours. Cardiac Enzymes: No results for input(s): "CKTOTAL", "CKMB", "CKMBINDEX", "TROPONINI" in the last 168 hours. BNP (last 3 results) No results for input(s): "PROBNP" in the last 8760 hours. HbA1C: No results for input(s): "HGBA1C" in the last 72 hours. CBG: No results for input(s): "GLUCAP" in the last 168 hours. Lipid Profile: No results for input(s): "CHOL", "HDL", "LDLCALC", "TRIG", "CHOLHDL", "LDLDIRECT" in the last 72 hours. Thyroid Function Tests: No results for input(s): "TSH", "T4TOTAL", "FREET4", "T3FREE", "THYROIDAB" in the last 72 hours. Anemia Panel: No results for input(s): "VITAMINB12", "FOLATE", "FERRITIN", "TIBC", "IRON", "RETICCTPCT" in the last 72 hours. Sepsis Labs: No results for input(s): "PROCALCITON", "LATICACIDVEN" in the last 168 hours.  No results found for this or any previous visit (from the past 240 hour(s)).   Radiology Studies: DG Chest Port 1 View  Result Date: 06/21/2022 CLINICAL DATA:  Altered mental status. EXAM: PORTABLE CHEST 1 VIEW COMPARISON:  08/11/2021 FINDINGS: 0609 hours. The cardiopericardial silhouette is within normal limits for size. The lungs are clear without focal pneumonia, edema, pneumothorax or pleural effusion. Interstitial  markings are diffusely coarsened with chronic features. Telemetry leads overlie the chest. IMPRESSION: Chronic interstitial coarsening without acute cardiopulmonary findings. Electronically Signed   By: Misty Stanley M.D.   On: 06/21/2022 06:23   CT Head Wo Contrast  Result Date: 06/21/2022 CLINICAL DATA:  62 year old female altered mental status. Combative. EXAM: CT HEAD WITHOUT CONTRAST TECHNIQUE: Contiguous axial images were obtained from the base of the skull through the  vertex without intravenous contrast. RADIATION DOSE REDUCTION: This exam was performed according to the departmental dose-optimization program which includes automated exposure control, adjustment of the mA and/or kV according to patient size and/or use of iterative reconstruction technique. COMPARISON:  Brain MRI 11/14/2007.  Head CT 08/12/2021. FINDINGS: Brain: Normal cerebral volume. No midline shift, ventriculomegaly, mass effect, evidence of mass lesion, intracranial hemorrhage or evidence of cortically based acute infarction. Gray-white matter differentiation is within normal limits throughout the brain. Vascular: Calcified atherosclerosis at the skull base. No suspicious intracranial vascular hyperdensity. Skull: No acute osseous abnormality identified. Sinuses/Orbits: Visualized paranasal sinuses and mastoids are clear. Other: Broad-based right posterior scalp hematoma on series 3, image 42. Underlying calvarium intact. Other orbit and scalp soft tissues appear negative. IMPRESSION: 1. Right posterior scalp hematoma without underlying skull fracture. 2. Normal noncontrast CT appearance of the brain. Electronically Signed   By: Genevie Ann M.D.   On: 06/21/2022 05:27   CT Angio Chest PE W and/or Wo Contrast  Result Date: 06/19/2022 CLINICAL DATA:  Abdomen pain EXAM: CT ANGIOGRAPHY CHEST CT ABDOMEN AND PELVIS WITH CONTRAST TECHNIQUE: Multidetector CT imaging of the chest was performed using the standard protocol during bolus administration of intravenous contrast. Multiplanar CT image reconstructions and MIPs were obtained to evaluate the vascular anatomy. Multidetector CT imaging of the abdomen and pelvis was performed using the standard protocol during bolus administration of intravenous contrast. RADIATION DOSE REDUCTION: This exam was performed according to the departmental dose-optimization program which includes automated exposure control, adjustment of the mA and/or kV according to patient size and/or  use of iterative reconstruction technique. CONTRAST:  16mL OMNIPAQUE IOHEXOL 350 MG/ML SOLN COMPARISON:  CT 04/26/2022, 04/20/2022, chest CT 08/12/2021 FINDINGS: CTA CHEST FINDINGS Cardiovascular: Satisfactory opacification of the pulmonary arteries to the segmental level. No evidence of pulmonary embolism. Mild aortic atherosclerosis. No aneurysm. No dissection. Mild coronary vascular calcification. Normal cardiac size. No sizable pericardial effusion. Mediastinum/Nodes: Midline trachea. No thyroid mass. Multiple enlarged mediastinal and hilar nodes. Prevascular node measures 13 mm, series 4, image 36. Paratracheal nodes measuring up to 14 mm. Precarinal node measures 17 mm, series 4, image 59. Right inferior hilar node its measuring up to 17 mm, previously 14 mm. Subcarinal lymph node measures 18 mm. Esophagus within normal limits. Small hiatal hernia Lungs/Pleura: Emphysema. No acute airspace disease, pleural effusion, or pneumothorax. Musculoskeletal: Sternum is intact.  No acute osseous abnormality. Review of the MIP images confirms the above findings. CT ABDOMEN and PELVIS FINDINGS Hepatobiliary: Status post cholecystectomy. No focal hepatic abnormality or biliary dilatation. Pancreas: Unremarkable. No pancreatic ductal dilatation or surrounding inflammatory changes. Spleen: Normal in size without focal abnormality. Adrenals/Urinary Tract: Adrenal glands are unremarkable. Kidneys are normal, without renal calculi, focal lesion, or hydronephrosis. Bladder is decompressed by Foley catheter. Stomach/Bowel: Stomach is within normal limits. Appendix not well seen but no right lower quadrant inflammation. No evidence of bowel wall thickening, distention, or inflammatory changes. Vascular/Lymphatic: Advanced aortic atherosclerosis. No aneurysm. No suspicious lymph nodes. Reproductive: Status post hysterectomy. No adnexal masses. Other: Negative for pelvic effusion or free  air. Musculoskeletal: No acute osseous  abnormality. Grade 1 anterolisthesis L4 on L5 with degenerative changes. Review of the MIP images confirms the above findings. IMPRESSION: 1. Negative for acute pulmonary embolus or aortic dissection. Emphysema without acute airspace disease. 2. No CT evidence for acute intra-abdominal or pelvic abnormality. 3. Slight interval increase in mediastinal and right hilar adenopathy compared to prior chest CT, correlate for any history of systemic or lymphoproliferative disease. 4. Aortic atherosclerosis. Aortic Atherosclerosis (ICD10-I70.0) and Emphysema (ICD10-J43.9). Electronically Signed   By: Donavan Foil M.D.   On: 06/19/2022 17:21   CT ABDOMEN PELVIS W CONTRAST  Result Date: 06/19/2022 CLINICAL DATA:  Abdomen pain EXAM: CT ANGIOGRAPHY CHEST CT ABDOMEN AND PELVIS WITH CONTRAST TECHNIQUE: Multidetector CT imaging of the chest was performed using the standard protocol during bolus administration of intravenous contrast. Multiplanar CT image reconstructions and MIPs were obtained to evaluate the vascular anatomy. Multidetector CT imaging of the abdomen and pelvis was performed using the standard protocol during bolus administration of intravenous contrast. RADIATION DOSE REDUCTION: This exam was performed according to the departmental dose-optimization program which includes automated exposure control, adjustment of the mA and/or kV according to patient size and/or use of iterative reconstruction technique. CONTRAST:  177mL OMNIPAQUE IOHEXOL 350 MG/ML SOLN COMPARISON:  CT 04/26/2022, 04/20/2022, chest CT 08/12/2021 FINDINGS: CTA CHEST FINDINGS Cardiovascular: Satisfactory opacification of the pulmonary arteries to the segmental level. No evidence of pulmonary embolism. Mild aortic atherosclerosis. No aneurysm. No dissection. Mild coronary vascular calcification. Normal cardiac size. No sizable pericardial effusion. Mediastinum/Nodes: Midline trachea. No thyroid mass. Multiple enlarged mediastinal and hilar nodes.  Prevascular node measures 13 mm, series 4, image 36. Paratracheal nodes measuring up to 14 mm. Precarinal node measures 17 mm, series 4, image 59. Right inferior hilar node its measuring up to 17 mm, previously 14 mm. Subcarinal lymph node measures 18 mm. Esophagus within normal limits. Small hiatal hernia Lungs/Pleura: Emphysema. No acute airspace disease, pleural effusion, or pneumothorax. Musculoskeletal: Sternum is intact.  No acute osseous abnormality. Review of the MIP images confirms the above findings. CT ABDOMEN and PELVIS FINDINGS Hepatobiliary: Status post cholecystectomy. No focal hepatic abnormality or biliary dilatation. Pancreas: Unremarkable. No pancreatic ductal dilatation or surrounding inflammatory changes. Spleen: Normal in size without focal abnormality. Adrenals/Urinary Tract: Adrenal glands are unremarkable. Kidneys are normal, without renal calculi, focal lesion, or hydronephrosis. Bladder is decompressed by Foley catheter. Stomach/Bowel: Stomach is within normal limits. Appendix not well seen but no right lower quadrant inflammation. No evidence of bowel wall thickening, distention, or inflammatory changes. Vascular/Lymphatic: Advanced aortic atherosclerosis. No aneurysm. No suspicious lymph nodes. Reproductive: Status post hysterectomy. No adnexal masses. Other: Negative for pelvic effusion or free air. Musculoskeletal: No acute osseous abnormality. Grade 1 anterolisthesis L4 on L5 with degenerative changes. Review of the MIP images confirms the above findings. IMPRESSION: 1. Negative for acute pulmonary embolus or aortic dissection. Emphysema without acute airspace disease. 2. No CT evidence for acute intra-abdominal or pelvic abnormality. 3. Slight interval increase in mediastinal and right hilar adenopathy compared to prior chest CT, correlate for any history of systemic or lymphoproliferative disease. 4. Aortic atherosclerosis. Aortic Atherosclerosis (ICD10-I70.0) and Emphysema  (ICD10-J43.9). Electronically Signed   By: Donavan Foil M.D.   On: 06/19/2022 17:21    Scheduled Meds:  budesonide  0.5 mg Inhalation BID   Continuous Infusions:  sodium chloride 1,000 mL (06/21/22 0613)   potassium chloride 10 mEq (06/21/22 0738)     LOS: 0 days   Time spent: 25min  Little Ishikawa, DO [facility]  If 7PM-7AM, please contact night-coverage [email or web address]  06/21/2022, 8:00 AM

## 2022-06-21 NOTE — ED Notes (Signed)
Critical sodium 114.  MD [facility] notified and aware.  No new orders at this time.

## 2022-06-21 NOTE — ED Notes (Signed)
Pt BP has been climbing.  MD [facility] notified and aware.  No new orders at this time.

## 2022-06-21 NOTE — ED Notes (Signed)
Temp 102.5 rectally.  MD [facility] notified and aware.  PRN suppository Tylenol given.

## 2022-06-21 NOTE — ED Notes (Signed)
MD paged again

## 2022-06-21 NOTE — ED Notes (Signed)
MD [facility] notified and aware of 102 temp after tylenol administration. No new orders at this time.

## 2022-06-21 NOTE — ED Notes (Signed)
Pt currently able to answer some simple yes and no questions, but still is unable to fully communicate needs and concerns. Pt repetitive with the word "yea"

## 2022-06-21 NOTE — ED Notes (Signed)
Patient trying to get out of the bed. RN found patient with both legs through the side rail and she was trying to stand. Patient not easily redirectable as she does not acknowledge that anyone is in the room with her. MD has been paged twice. RN awaiting MD to call.

## 2022-06-22 DIAGNOSIS — G9340 Encephalopathy, unspecified: Secondary | ICD-10-CM | POA: Diagnosis not present

## 2022-06-22 DIAGNOSIS — E871 Hypo-osmolality and hyponatremia: Secondary | ICD-10-CM | POA: Diagnosis not present

## 2022-06-22 DIAGNOSIS — E876 Hypokalemia: Secondary | ICD-10-CM | POA: Diagnosis not present

## 2022-06-22 LAB — RESPIRATORY PANEL BY PCR

## 2022-06-22 LAB — BASIC METABOLIC PANEL
Anion gap: 10 (ref 5–15)
Anion gap: 9 (ref 5–15)
BUN: <5 mg/dL — ABNORMAL LOW (ref 8–23)
BUN: <5 mg/dL — ABNORMAL LOW (ref 8–23)
CO2: 22 mmol/L (ref 22–32)
CO2: 22 mmol/L (ref 22–32)
Calcium: 8.1 mg/dL — ABNORMAL LOW (ref 8.9–10.3)
Calcium: 7.9 mg/dL — ABNORMAL LOW (ref 8.9–10.3)
Chloride: 88 mmol/L — ABNORMAL LOW (ref 98–111)
Chloride: 93 mmol/L — ABNORMAL LOW (ref 98–111)
Creatinine, Ser: 0.49 mg/dL (ref 0.44–1.00)
Creatinine, Ser: 0.63 mg/dL (ref 0.44–1.00)
GFR, Estimated: >60 mL/min (ref >60)
GFR, Estimated: >60 mL/min (ref >60)
Glucose, Bld: 95 mg/dL (ref 70–99)
Glucose, Bld: 110 mg/dL — ABNORMAL HIGH (ref 70–99)
Potassium: 2.9 mmol/L — ABNORMAL LOW (ref 3.5–5.1)
Potassium: 3.3 mmol/L — ABNORMAL LOW (ref 3.5–5.1)
Sodium: 120 mmol/L — ABNORMAL LOW (ref 135–145)
Sodium: 124 mmol/L — ABNORMAL LOW (ref 135–145)

## 2022-06-22 LAB — RESP PANEL BY RT-PCR (RSV, FLU A&B, COVID)  RVPGX2
Influenza A by PCR: NEGATIVE
Influenza B by PCR: NEGATIVE
Resp Syncytial Virus by PCR: NEGATIVE
SARS Coronavirus 2 by RT PCR: NEGATIVE

## 2022-06-22 LAB — PROTIME-INR
INR: 1.1 (ref 0.8–1.2)
Prothrombin Time: 14.1 seconds (ref 11.4–15.2)

## 2022-06-22 LAB — BLOOD GAS, VENOUS
Acid-Base Excess: 0.5 mmol/L (ref 0.0–2.0)
Bicarbonate: 25.4 mmol/L (ref 20.0–28.0)
Drawn by: 164
O2 Saturation: 69.1 %
Patient temperature: 37
pCO2, Ven: 41 mmHg — ABNORMAL LOW (ref 44–60)
pH, Ven: 7.4 (ref 7.25–7.43)
pO2, Ven: 39 mmHg (ref 32–45)

## 2022-06-22 LAB — BASIC METABOLIC PANEL WITH GFR
Anion gap: 10 (ref 5–15)
BUN: <5 mg/dL — ABNORMAL LOW (ref 8–23)
CO2: 24 mmol/L (ref 22–32)
Calcium: 8.1 mg/dL — ABNORMAL LOW (ref 8.9–10.3)
Chloride: 86 mmol/L — ABNORMAL LOW (ref 98–111)
Creatinine, Ser: 0.55 mg/dL (ref 0.44–1.00)
GFR, Estimated: >60 mL/min
Glucose, Bld: 106 mg/dL — ABNORMAL HIGH (ref 70–99)
Potassium: 3.2 mmol/L — ABNORMAL LOW (ref 3.5–5.1)
Sodium: 120 mmol/L — ABNORMAL LOW (ref 135–145)

## 2022-06-22 LAB — CBC
HCT: 34.9 % — ABNORMAL LOW (ref 36.0–46.0)
Hemoglobin: 12.7 g/dL (ref 12.0–15.0)
MCH: 30.7 pg (ref 26.0–34.0)
MCHC: 36.4 g/dL — ABNORMAL HIGH (ref 30.0–36.0)
MCV: 84.3 fL (ref 80.0–100.0)
Platelets: 169 10*3/uL (ref 150–400)
RBC: 4.14 MIL/uL (ref 3.87–5.11)
RDW: 11.7 % (ref 11.5–15.5)
WBC: 12.7 10*3/uL — ABNORMAL HIGH (ref 4.0–10.5)
nRBC: 0 % (ref 0.0–0.2)

## 2022-06-22 LAB — MAGNESIUM: Magnesium: 1.8 mg/dL (ref 1.7–2.4)

## 2022-06-22 LAB — URIC ACID: Uric Acid, Serum: <1.5 mg/dL — ABNORMAL LOW (ref 2.5–7.1)

## 2022-06-22 LAB — AMMONIA: Ammonia: 28 umol/L (ref 9–35)

## 2022-06-22 MED ORDER — CHLORHEXIDINE GLUCONATE CLOTH 2 % EX PADS
6.0000 | MEDICATED_PAD | Freq: Every day | CUTANEOUS | Status: DC
  Administered 2022-06-23 – 2022-06-24 (×2): 6 via TOPICAL

## 2022-06-22 MED ORDER — SODIUM CHLORIDE 1 G PO TABS
2.0000 g | ORAL_TABLET | Freq: Three times a day (TID) | ORAL | Status: DC
  Administered 2022-06-22 – 2022-06-24 (×9): 2 g via ORAL
  Filled 2022-06-22 (×9): qty 2

## 2022-06-22 MED ORDER — POTASSIUM CHLORIDE 10 MEQ/100ML IV SOLN
10.0000 meq | INTRAVENOUS | Status: AC
  Administered 2022-06-22 (×4): 10 meq via INTRAVENOUS
  Filled 2022-06-22 (×4): qty 100

## 2022-06-22 MED ORDER — POTASSIUM CHLORIDE CRYS ER 20 MEQ PO TBCR
40.0000 meq | EXTENDED_RELEASE_TABLET | Freq: Once | ORAL | Status: AC
  Administered 2022-06-22: 40 meq via ORAL
  Filled 2022-06-22: qty 2

## 2022-06-22 NOTE — ED Notes (Signed)
Opens eyes to voice. Not speaking or following commands. Vitals stable. Son at bedside.

## 2022-06-22 NOTE — ED Notes (Signed)
Consulted with lab/ phlebotomy to have them check for and draw any outstanding labs. I see blood cultures ordered on a previous shift and am not sure what all they need to obtain from her.

## 2022-06-22 NOTE — ED Notes (Signed)
Patient now alert. Oriented x 4. Complains of headache and nagging unproductive cough. Medicated- See MAR. No difficulty swallowing. Following commands. Equal strength in all extremities.

## 2022-06-22 NOTE — ED Notes (Signed)
Sitting up in bed. A&O x 4 with intermittent periods of slight confusion. Ate 50% of lunch tray. Tolerated well.

## 2022-06-22 NOTE — ED Notes (Signed)
Second bag of potassium IV completed. Third bag started.

## 2022-06-22 NOTE — ED Notes (Signed)
4th bag of IV potassium completed.

## 2022-06-22 NOTE — Progress Notes (Addendum)
In review of chart in preparation for admit to [facility], REVIEWED resp results and note as following:   Latest Reference Range & Units 06/22/22 08:43  Influenza A By PCR NEGATIVE  NEGATIVE  Influenza B By PCR NEGATIVE  NEGATIVE  Respiratory Syncytial Virus by PCR NEGATIVE  NEGATIVE  SARS Coronavirus 2 by RT PCR NEGATIVE  NEGATIVE   TECTED NOT DETECTED   Coronavirus 229E NOT DETECTED NOT DETECTED  Comment: (NOTE) The Coronavirus on the Respiratory Panel, DOES NOT test for the novel Coronavirus (2019 nCoV)  Coronavirus HKU1 NOT DETECTED NOT DETECTED  Coronavirus NL63 NOT DETECTED NOT DETECTED  Coronavirus OC43 NOT DETECTED NOT DETECTED  Metapneumovirus NOT DETECTED NOT DETECTED  Rhinovirus / Enterovirus NOT DETECTED NOT DETECTED  Influenza A NOT DETECTED NOT DETECTED  Influenza B NOT DETECTED NOT DETECTED  Parainfluenza Virus 1 NOT DETECTED NOT DETECTED  Parainfluenza Virus 2 NOT DETECTED NOT DETECTED  Parainfluenza Virus 3 NOT DETECTED NOT DETECTED  Parainfluenza Virus 4 NOT DETECTED NOT DETECTED  Respiratory Syncytial Virus NOT DETECTED NOT DETECTED  Bordetella pertussis NOT DETECTED NOT DETECTED  Bordetella Parapertussis NOT DETECTED NOT DETECTED  Chlamydophila pneumoniae NOT DETECTED NOT DETECTED  Mycoplasma pneumoniae NOT DETECT    PPE discontinued per protocol. SRP, RN

## 2022-06-22 NOTE — ED Notes (Signed)
Third bag of IV potassium completed. 4th bag of IV potassium started.

## 2022-06-22 NOTE — Progress Notes (Signed)
PROGRESS NOTE    Katelyn GARLAND  [name]:[id number] DOB: 1961-05-23 DOA: 06/20/2022 PCP: Alease Medina, MD   Brief Narrative:  Katelyn [name] is a 62 y.o. female with medical history significant for essential hypertension, hyperlipidemia, COPD, generalized anxiety disorder, chronic tobacco abuse, chronic diastolic heart failure, chronic hyponatremia associated baseline sodium of 125-130, who was admitted to [facility] on 06/20/2022 with acute encephalopathy. Of note patient previously presented to [facility] on 06/[facility]22 with acute urinary retention, Foley catheter placed and subsequently discharged home with plan for urology follow-up.    Assessment & Plan:   Acute metabolic encephalopathy Altered mental status thought to be secondary to hyponatremia.  Urine drug screen was positive for benzos and barbiturates with concern for polypharmacy. Patient does not have any focal neurological deficits. Patient also noted to be febrile with no clear infectious source identified. Mentation appears to have improved.  She answered my questions appropriately.  Still somewhat distracted.   CT head did not show any acute intracranial abnormalities. Continue to reorient.  Mouth monitor mentation closely.  Hyponatremia Patient with chronic hyponatremia baseline between 1 25-1 30.  Came in with sodium level as low as 112.  Urine osmolality was ordered but has not been done yet.  Initially there was concern for hypovolemia and patient was started on IV fluids.  Sodium level has improved to 120 this morning but has plateaued. Uric acid level noted to be low at 1.5 raising concern for SIADH. Will place her on fluid restrictions.  Will give her salt tablets.  Recheck sodium level later today.  Since mentation has improved and since sodium level has improved we will hold off on nephrology consult at this time.  Nephrology was not consulted at the time of admission. TSH was normal.  Will check cortisol level in  the morning.  Fever Procalcitonin less than 0.1.  WBC is mildly elevated.  Chest x-ray showed chronic interstitial coarsening.  UA does not suggest infection.  COVID-19 and flu PCR is pending. CT scan of the chest abdomen pelvis did not show any acute findings.  Reactive lymphadenopathy noted in the chest cavity. Patient is on broad-spectrum coverage with vancomycin and Zosyn.  Follow-up blood cultures.  Hypokalemia/hypomagnesemia Will be repleted.  Check magnesium level in a.m. tomorrow morning.  Subacute urinary retention Recent foley 12/31 placement at [facility] Continue Foley catheter no signs or symptoms of obstruction at this time  Chronic diastolic heart failure, not in acute exacerbation; EF 60-65% Was hypovolemic.  Continue with IV fluids though will decrease the rate as discussed above.    Hypertension, essential/Hyperlipidemia -Initially hypertensive in the setting of agitation but currently normotensive off home medications -Monitor closely for hypotension in the setting of suspected infection as above  COPD w/o exacerbation Chronic tobacco use/abuse -Not in acute exacerbation, currently on room air, smoking cessation education once patient is more awake alert oriented  DVT prophylaxis: Heparin Code Status: Full Family Communication: None present Disposition: To be determined  Status is: Inpatient  Dispo: The patient is from: Home              Anticipated d/c is to: To be determined              Anticipated d/c date is: 48 to 72 hours              Patient currently not medically stable for discharge given ongoing mental status changes profound hyponatremia and presumed infection  Consultants:  None  Procedures:  None  Antimicrobials:  Vancomycin, Zosyn -de-escalate pending cultures  Subjective: Answering few questions.  Does not complain of any pain issues.  Mildly distracted.  Objective: Vitals:   06/22/22 0600 06/22/22 0630 06/22/22 0700 06/22/22 0840  BP:  (!) 143/72 (!) 155/86 114/88   Pulse: 85 92 90   Resp: (!) 22 (!) 28 (!) 26   Temp:    (!) 96.9 F (36.1 C)  TempSrc:    Axillary  SpO2: 98% 96% 97%     Intake/Output Summary (Last 24 hours) at 06/22/2022 0925 Last data filed at 06/22/2022 3382 Gross per 24 hour  Intake 915.83 ml  Output 4225 ml  Net -3309.17 ml    There were no vitals filed for this visit.  Examination:  General appearance: Awake alert.  In no distress Resp: Clear to auscultation bilaterally.  Normal effort Cardio: S1-S2 is normal regular.  No S3-S4.  No rubs murmurs or bruit GI: Abdomen is soft.  Nontender nondistended.  Bowel sounds are present normal.  No masses organomegaly Extremities: No edema.  Full range of motion of lower extremities. Neurologic: Alert and oriented x3.  No focal neurological deficits.    Data Reviewed: I have personally reviewed following labs and imaging studies  CBC: Recent Labs  Lab 06/19/22 1146 06/21/22 0030 06/21/22 0404 06/22/22 0454  WBC 4.8 10.4 12.8* 12.7*  NEUTROABS  --  8.4* 10.8*  --   HGB 13.3 13.2 13.2 12.7  HCT 38.5 37.2 37.6 34.9*  MCV 86.5 85.1 85.5 84.3  PLT 197 192 207 505    Basic Metabolic Panel: Recent Labs  Lab 06/21/22 0404 06/21/22 1055 06/21/22 1725 06/21/22 2135 06/22/22 0234 06/22/22 0454  NA 112* 114* 116* 118* 120* 120*  K 3.3* 3.2* 2.8* 2.4* 3.2* 2.9*  CL 77* 82* 84* 89* 86* 88*  CO2 23 22 20* 21* 24 22  GLUCOSE 131* 132* 111* 107* 106* 95  BUN <5* <5* 6* 5* <5* <5*  CREATININE 0.60 0.57 0.54 0.45 0.55 0.49  CALCIUM 8.4* 8.3* 7.7* 7.8* 8.1* 8.1*  MG 1.3*  --   --   --   --   --     GFR: Estimated Creatinine Clearance: 69.1 mL/min (by C-G formula based on SCr of 0.49 mg/dL). Liver Function Tests: Recent Labs  Lab 06/19/22 1146 06/21/22 0404  AST 24 39  ALT 25 27  ALKPHOS 76 83  BILITOT 0.2* 0.8  PROT 7.2 6.9  ALBUMIN 3.8 3.7    Recent Labs  Lab 06/19/22 1146  LIPASE 30    Recent Labs  Lab 06/22/22 0454   AMMONIA 28   Coagulation Profile: Recent Labs  Lab 06/22/22 0454  INR 1.1   Cardiac Enzymes: Recent Labs  Lab 06/21/22 1055  CKTOTAL 913*    Thyroid Function Tests: Recent Labs    06/21/22 0404  TSH 0.703    Sepsis Labs: Recent Labs  Lab 06/21/22 1055  PROCALCITON <0.10    Recent Results (from the past 240 hour(s))  Culture, blood (Routine X 2) w Reflex to ID Panel     Status: None (Preliminary result)   Collection Time: 06/21/22 10:49 AM   Specimen: BLOOD  Result Value Ref Range Status   Specimen Description BLOOD RIGHT ANTECUBITAL  Final   Special Requests   Final    BOTTLES DRAWN AEROBIC AND ANAEROBIC Blood Culture adequate volume   Culture   Final    NO GROWTH < 24 HOURS Performed at [facility], [address] [place], [place] [address]    Report Status PENDING  Incomplete     Radiology Studies: DG Chest Port 1 View  Result Date: 06/21/2022 CLINICAL DATA:  Altered mental status. EXAM: PORTABLE CHEST 1 VIEW COMPARISON:  08/11/2021 FINDINGS: 0609 hours. The cardiopericardial silhouette is within normal limits for size. The lungs are clear without focal pneumonia, edema, pneumothorax or pleural effusion. Interstitial markings are diffusely coarsened with chronic features. Telemetry leads overlie the chest. IMPRESSION: Chronic interstitial coarsening without acute cardiopulmonary findings. Electronically Signed   By: Misty Stanley M.D.   On: 06/21/2022 06:23   CT Head Wo Contrast  Result Date: 06/21/2022 CLINICAL DATA:  62 year old female altered mental status. Combative. EXAM: CT HEAD WITHOUT CONTRAST TECHNIQUE: Contiguous axial images were obtained from the base of the skull through the vertex without intravenous contrast. RADIATION DOSE REDUCTION: This exam was performed according to the departmental dose-optimization program which includes automated exposure control, adjustment of the mA and/or kV according to patient size and/or use of iterative  reconstruction technique. COMPARISON:  Brain MRI 11/14/2007.  Head CT 08/12/2021. FINDINGS: Brain: Normal cerebral volume. No midline shift, ventriculomegaly, mass effect, evidence of mass lesion, intracranial hemorrhage or evidence of cortically based acute infarction. Gray-white matter differentiation is within normal limits throughout the brain. Vascular: Calcified atherosclerosis at the skull base. No suspicious intracranial vascular hyperdensity. Skull: No acute osseous abnormality identified. Sinuses/Orbits: Visualized paranasal sinuses and mastoids are clear. Other: Broad-based right posterior scalp hematoma on series 3, image 42. Underlying calvarium intact. Other orbit and scalp soft tissues appear negative. IMPRESSION: 1. Right posterior scalp hematoma without underlying skull fracture. 2. Normal noncontrast CT appearance of the brain. Electronically Signed   By: Genevie Ann M.D.   On: 06/21/2022 05:27    Scheduled Meds:  budesonide  0.5 mg Inhalation BID   heparin  5,000 Units Subcutaneous Q8H   sodium chloride  2 g Oral TID WC   Continuous Infusions:  0.9 % NaCl with KCl 40 mEq / L 75 mL/hr at 06/21/22 2206   piperacillin-tazobactam (ZOSYN)  IV 3.375 g (06/22/22 0512)   potassium chloride     potassium chloride     vancomycin       LOS: 1 day    Bonnielee Haff,  [facility]  If 7PM-7AM, please contact night-coverage [email or web address]  06/22/2022, 9:25 AM

## 2022-06-23 ENCOUNTER — Other Ambulatory Visit: Payer: Self-pay

## 2022-06-23 DIAGNOSIS — E876 Hypokalemia: Secondary | ICD-10-CM | POA: Diagnosis not present

## 2022-06-23 DIAGNOSIS — G9340 Encephalopathy, unspecified: Secondary | ICD-10-CM | POA: Diagnosis not present

## 2022-06-23 DIAGNOSIS — E871 Hypo-osmolality and hyponatremia: Secondary | ICD-10-CM | POA: Diagnosis not present

## 2022-06-23 LAB — CBC
HCT: 31.1 % — ABNORMAL LOW (ref 36.0–46.0)
Hemoglobin: 11.6 g/dL — ABNORMAL LOW (ref 12.0–15.0)
MCH: 31.5 pg (ref 26.0–34.0)
MCHC: 37.3 g/dL — ABNORMAL HIGH (ref 30.0–36.0)
MCV: 84.5 fL (ref 80.0–100.0)
Platelets: 170 10*3/uL (ref 150–400)
RBC: 3.68 MIL/uL — ABNORMAL LOW (ref 3.87–5.11)
RDW: 12 % (ref 11.5–15.5)
WBC: 9.2 10*3/uL (ref 4.0–10.5)
nRBC: 0 % (ref 0.0–0.2)

## 2022-06-23 LAB — BASIC METABOLIC PANEL
Anion gap: 10 (ref 5–15)
BUN: <5 mg/dL — ABNORMAL LOW (ref 8–23)
CO2: 19 mmol/L — ABNORMAL LOW (ref 22–32)
Calcium: 8 mg/dL — ABNORMAL LOW (ref 8.9–10.3)
Chloride: 98 mmol/L (ref 98–111)
Creatinine, Ser: 0.57 mg/dL (ref 0.44–1.00)
GFR, Estimated: >60 mL/min (ref >60)
Glucose, Bld: 117 mg/dL — ABNORMAL HIGH (ref 70–99)
Potassium: 3.2 mmol/L — ABNORMAL LOW (ref 3.5–5.1)
Sodium: 127 mmol/L — ABNORMAL LOW (ref 135–145)

## 2022-06-23 LAB — CORTISOL-AM, BLOOD: Cortisol - AM: 13.4 ug/dL (ref 6.7–22.6)

## 2022-06-23 LAB — PROCALCITONIN: Procalcitonin: <0.1 ng/mL

## 2022-06-23 MED ORDER — CLONIDINE HCL 0.1 MG PO TABS
0.1000 mg | ORAL_TABLET | Freq: Two times a day (BID) | ORAL | Status: DC
  Administered 2022-06-23 – 2022-06-24 (×3): 0.1 mg via ORAL
  Filled 2022-06-23 (×3): qty 1

## 2022-06-23 MED ORDER — FLUOXETINE HCL 10 MG PO CAPS
10.0000 mg | ORAL_CAPSULE | Freq: Every day | ORAL | Status: DC
  Administered 2022-06-24: 10 mg via ORAL
  Filled 2022-06-23 (×2): qty 1

## 2022-06-23 MED ORDER — AMLODIPINE BESYLATE 10 MG PO TABS
10.0000 mg | ORAL_TABLET | Freq: Every day | ORAL | Status: DC
  Administered 2022-06-23 – 2022-06-24 (×2): 10 mg via ORAL
  Filled 2022-06-23 (×2): qty 1

## 2022-06-23 MED ORDER — ATORVASTATIN CALCIUM 40 MG PO TABS
40.0000 mg | ORAL_TABLET | Freq: Every day | ORAL | Status: DC
  Administered 2022-06-23 – 2022-06-24 (×2): 40 mg via ORAL
  Filled 2022-06-23 (×3): qty 1

## 2022-06-23 MED ORDER — QUETIAPINE FUMARATE 50 MG PO TABS
50.0000 mg | ORAL_TABLET | Freq: Two times a day (BID) | ORAL | Status: DC
  Administered 2022-06-23 – 2022-06-24 (×2): 50 mg via ORAL
  Filled 2022-06-23 (×2): qty 1

## 2022-06-23 MED ORDER — BENZONATATE 100 MG PO CAPS
100.0000 mg | ORAL_CAPSULE | Freq: Two times a day (BID) | ORAL | Status: DC | PRN
  Administered 2022-06-23 (×2): 100 mg via ORAL
  Filled 2022-06-23 (×2): qty 1

## 2022-06-23 MED ORDER — BUTALBITAL-APAP-CAFFEINE 50-325-40 MG PO TABS: 1.0000 | ORAL_TABLET | Freq: Every day | ORAL | Status: DC | PRN

## 2022-06-23 MED ORDER — MAGNESIUM SULFATE 2 GM/50ML IV SOLN
2.0000 g | Freq: Once | INTRAVENOUS | Status: AC
  Administered 2022-06-23: 2 g via INTRAVENOUS
  Filled 2022-06-23: qty 50

## 2022-06-23 MED ORDER — PREDNISONE 20 MG PO TABS
60.0000 mg | ORAL_TABLET | Freq: Every day | ORAL | Status: DC
  Administered 2022-06-23 – 2022-06-24 (×2): 60 mg via ORAL
  Filled 2022-06-23 (×2): qty 3

## 2022-06-23 MED ORDER — SENNOSIDES-DOCUSATE SODIUM 8.6-50 MG PO TABS
2.0000 | ORAL_TABLET | Freq: Two times a day (BID) | ORAL | Status: DC
  Administered 2022-06-23 – 2022-06-24 (×2): 2 via ORAL
  Filled 2022-06-23 (×2): qty 2

## 2022-06-23 MED ORDER — DOXYCYCLINE HYCLATE 100 MG PO TABS
100.0000 mg | ORAL_TABLET | Freq: Two times a day (BID) | ORAL | Status: DC
  Administered 2022-06-23 – 2022-06-24 (×3): 100 mg via ORAL
  Filled 2022-06-23 (×3): qty 1

## 2022-06-23 MED ORDER — POTASSIUM CHLORIDE CRYS ER 20 MEQ PO TBCR
40.0000 meq | EXTENDED_RELEASE_TABLET | ORAL | Status: AC
  Administered 2022-06-23 (×2): 40 meq via ORAL
  Filled 2022-06-23 (×2): qty 2

## 2022-06-23 NOTE — Evaluation (Signed)
Physical Therapy Evaluation Patient Details Name: Katelyn [name] MRN: [id number] DOB: [birth year] Today's Date: 06/23/2022  History of Present Illness  Katelyn [name] is a 62 y.o. female with medical history significant for essential hypertension, hyperlipidemia, COPD, generalized anxiety disorder, chronic tobacco abuse, chronic diastolic heart failure, chronic hyponatremia associated baseline sodium of 125-130, who was admitted to [facility] on 06/20/2022 with acute encephalopathy.  Also with recent stay for urinary retention now with foley.  Clinical Impression  Patient presents with decreased mobility due to weakness, decreased balance, decreased activity tolerance with HR up to 126 walking to bathroom.  Previously managing at home on her own.  Son stays with her but does not help her.  Feel she will need follow up HHPT at d/c.  PT to continue to follow during acute stay.        Recommendations for follow up therapy are one component of a multi-disciplinary discharge planning process, led by the attending physician.  Recommendations may be updated based on patient status, additional functional criteria and insurance authorization.  Follow Up Recommendations Home health PT      Assistance Recommended at Discharge Intermittent Supervision/Assistance  Patient can return home with the following  A little help with walking and/or transfers;Assistance with cooking/housework;Assist for transportation;Help with stairs or ramp for entrance;A little help with bathing/dressing/bathroom    Equipment Recommendations None recommended by PT  Recommendations for Other Services       Functional Status Assessment Patient has had a recent decline in their functional status and demonstrates the ability to make significant improvements in function in a reasonable and predictable amount of time.     Precautions / Restrictions Precautions Precautions: Fall Precaution Comments: watch HR       Mobility  Bed Mobility Overal bed mobility: Needs Assistance Bed Mobility: Supine to Sit, Sit to Supine     Supine to sit: Min guard, HOB elevated Sit to supine: Supervision   General bed mobility comments: assist for balance sitting up; S for lines    Transfers Overall transfer level: Needs assistance Equipment used: 1 person hand held assist, None Transfers: Sit to/from Stand Sit to Stand: Min guard, Min assist           General transfer comment: assist for balance    Ambulation/Gait Ambulation/Gait assistance: Min guard, Min assist Gait Distance (Feet): 12 Feet (x 2) Assistive device: 1 person hand held assist, None Gait Pattern/deviations: Step-through pattern, Decreased stride length       General Gait Details: some imbalance requiring HHA to bathroom, back to bed with minguard A for balance  Stairs            Wheelchair Mobility    Modified Rankin (Stroke Patients Only)       Balance Overall balance assessment: Needs assistance   Sitting balance-Leahy Scale: Good       Standing balance-Leahy Scale: Fair Standing balance comment: static balance without A, but with ambulation needed minguard to min A                             Pertinent Vitals/Pain Pain Assessment Pain Assessment: Faces Faces Pain Scale: Hurts even more Pain Location: generalized aches Pain Descriptors / Indicators: Aching, Grimacing Pain Intervention(s): Monitored during session, Repositioned, Limited activity within patient's tolerance    Home Living Family/patient expects to be discharged to:: Private residence Living Arrangements: Children Available Help at Discharge: Other (Comment) (son stays with her,  but does not help) Type of Home: House Home Access: Stairs to enter   [employer] of Steps: 4-5 at side and front entrance, rails on side entrance only   Home Layout: One level Home Equipment: Conservation officer, nature (2 wheels);Shower  seat Additional Comments: thinks she has a shower seat    Prior Function Prior Level of Function : Independent/Modified Independent;History of Falls (last six months)             Mobility Comments: thinks she fall prior to admission       Hand Dominance   Dominant Hand: Right    Extremity/Trunk Assessment   Upper Extremity Assessment Upper Extremity Assessment: Generalized weakness    Lower Extremity Assessment Lower Extremity Assessment: Generalized weakness    Cervical / Trunk Assessment Cervical / Trunk Assessment: Normal  Communication   Communication: No difficulties  Cognition Arousal/Alertness: Awake/alert Behavior During Therapy: WFL for tasks assessed/performed Overall Cognitive Status: Within Functional Limits for tasks assessed                                 General Comments: not formally tested, seems to know tested negative for flu, covid, etc, states might have pneumonia        General Comments General comments (skin integrity, edema, etc.): HR  up to 126, pt feeling consitpated and RN made aware    Exercises     Assessment/Plan    PT Assessment Patient needs continued PT services  PT Problem List Decreased strength;Decreased balance;Cardiopulmonary status limiting activity;Decreased mobility;Decreased activity tolerance;Decreased safety awareness       PT Treatment Interventions DME instruction;Functional mobility training;Balance training;Patient/family education;Gait training;Therapeutic activities;Stair training;Therapeutic exercise    PT Goals (Current goals can be found in the Care Plan section)  Acute Rehab PT Goals Patient Stated Goal: to feel better PT Goal Formulation: With patient Time For Goal Achievement: 07/07/22 Potential to Achieve Goals: Good    Frequency Min 3X/week     Co-evaluation               AM-PAC PT "6 Clicks" Mobility  Outcome Measure Help needed turning from your back to your side  while in a flat bed without using bedrails?: A Little Help needed moving from lying on your back to sitting on the side of a flat bed without using bedrails?: A Little Help needed moving to and from a bed to a chair (including a wheelchair)?: A Little Help needed standing up from a chair using your arms (e.g., wheelchair or bedside chair)?: A Little Help needed to walk in hospital room?: Total Help needed climbing 3-5 steps with a railing? : Total 6 Click Score: 14    End of Session   Activity Tolerance: Patient limited by fatigue Patient left: in bed;with call bell/phone within reach;with bed alarm set   PT Visit Diagnosis: Other abnormalities of gait and mobility (R26.89);Muscle weakness (generalized) (M62.81)    Time: 4536-4680 PT Time Calculation (min) (ACUTE ONLY): 24 min   Charges:   PT Evaluation $PT Eval Moderate Complexity: 1 Mod PT Treatments $Therapeutic Activity: 8-22 mins        Magda Kiel, PT Acute Rehabilitation Services [phone] 06/23/2022   Reginia Naas 06/23/2022, 4:53 PM

## 2022-06-23 NOTE — Progress Notes (Signed)
PROGRESS NOTE    Katelyn [name]  [id number] DOB: December 29, 1960 DOA: 06/20/2022 PCP: [name], MD   Brief Narrative:  Katelyn [name] is a 62 y.o. female with medical history significant for essential hypertension, hyperlipidemia, COPD, generalized anxiety disorder, chronic tobacco abuse, chronic diastolic heart failure, chronic hyponatremia associated baseline sodium of 125-130, who was admitted to [facility] on 06/20/2022 with acute encephalopathy. Of note patient previously presented to [facility] on 06/19/2022 with acute urinary retention, Foley catheter placed and subsequently discharged home with plan for urology follow-up.    Assessment & Plan:   Acute metabolic encephalopathy Altered mental status thought to be secondary to hyponatremia.   Urine drug screen was positive for benzos and barbiturates with concern for polypharmacy. Patient does not have any focal neurological deficits. Patient also noted to be febrile with no clear infectious source identified. CT head did not show any acute intracranial abnormalities. Mentation has improved.  Fully oriented this morning.  Hyponatremia Patient with chronic hyponatremia baseline between 125-130.  Came in with sodium level as low as 112.  Urine osmolality was ordered but has not been done yet.  Initially there was concern for hypovolemia and patient was started on IV fluids.  Uric acid level noted to be low at 1.5 raising concern for SIADH. Patient was initially given IV fluids with some improvement in sodium level.  There could have been an element of hypovolemia initially.  Subsequently placed on fluid restriction.  Started on salt tablets.  Sodium level has improved and close to baseline now.  Mentation is also improved. TSH was normal.  Cortisol levels normal. Recheck labs tomorrow.  Discontinue IV fluids.  Fever with cough and wheezing/COPD with acute exacerbation Patient was noted to have mildly elevated WBC.   Procalcitonin has been less than 0.1.  Fever has resolved.  COVID-19, RSV, influenza PCR's have been negative.  Respiratory viral panel was negative.  Chest x-ray did not show any acute findings.   CT scan of the chest abdomen pelvis did not show any acute findings.  Reactive lymphadenopathy noted in the chest cavity. Patient was initially placed on vancomycin and Zosyn.  Blood cultures have been negative.   Patient appears to be experiencing COPD with acute exacerbation.   We will change her to doxycycline.  Initiate steroids.  Hypokalemia/hypomagnesemia Continue to replete.  Subacute urinary retention Recent foley 12/31 placement at [facility] Continue Foley catheter no signs or symptoms of obstruction at this time Supposed to follow-up with urology at [facility].  Chronic diastolic heart failure, not in acute exacerbation; EF 60-65% Was hypovolemic initially.  Now volume status seems to be euvolemic.  Will stop IV fluids.  Hypertension, essential/Hyperlipidemia -Initially hypertensive in the setting of agitation.  Blood pressures have improved but remains elevated at times.  Continue to monitor closely.  Prior to admission she was on amlodipine clonidine, hydrochlorothiazide and metoprolol.   Will reinitiate clonidine and amlodipine for now.    Chronic tobacco use/abuse Counseled to stop smoking  DVT prophylaxis: Heparin SQ Code Status: Full Family Communication: None present Disposition: To be determined.  PT and OT eval is pending.  Status is: Inpatient Remains inpatient appropriate because: Hyponatremia    Consultants:  None  Procedures:  None   Subjective: Patient complains of feeling poorly this morning.  Continues to have shortness of breath with cough.  Denies any chest pain.  No nausea vomiting.  Objective: Vitals:   06/23/22 0329 06/23/22 0343 06/23/22 0600 06/23/22 0814  BP: Marland Kitchen)  156/90   (!) 153/97  Pulse: (!) 107   100  Resp: (!) 21  (!) 21 20  Temp: 98.2 F  (36.8 C)     TempSrc: Oral     SpO2: 97%  99% 97%  Weight:  68.1 kg    Height:        Intake/Output Summary (Last 24 hours) at 06/23/2022 0944 Last data filed at 06/23/2022 0742 Gross per 24 hour  Intake 1507.2 ml  Output 2600 ml  Net -1092.8 ml    Filed Weights   06/22/22 1208 06/22/22 2254 06/23/22 0343  Weight: 68 kg 68.1 kg 68.1 kg    Examination:  General appearance: Awake alert.  In no distress Resp: Wheezing heard bilaterally with few crackles at the bases.  No rhonchi. Cardio: S1-S2 is normal regular.  No S3-S4.  No rubs murmurs or bruit GI: Abdomen is soft.  Nontender nondistended.  Bowel sounds are present normal.  No masses organomegaly Extremities: No edema.  Moving all 4 extremities.  Physical deconditioning is noted. Neurologic: Alert and oriented x3.  No focal neurological deficits.     Data Reviewed: I have personally reviewed following labs and imaging studies  CBC: Recent Labs  Lab 06/19/22 1146 06/21/22 0030 06/21/22 0404 06/22/22 0454 06/23/22 0344  WBC 4.8 10.4 12.8* 12.7* 9.2  NEUTROABS  --  8.4* 10.8*  --   --   HGB 13.3 13.2 13.2 12.7 11.6*  HCT 38.5 37.2 37.6 34.9* 31.1*  MCV 86.5 85.1 85.5 84.3 84.5  PLT 197 192 207 169 099    Basic Metabolic Panel: Recent Labs  Lab 06/21/22 0404 06/21/22 1055 06/21/22 2135 06/22/22 0234 06/22/22 0454 06/22/22 1536 06/23/22 0344  NA 112*   < > 118* 120* 120* 124* 127*  K 3.3*   < > 2.4* 3.2* 2.9* 3.3* 3.2*  CL 77*   < > 89* 86* 88* 93* 98  CO2 23   < > 21* 24 22 22  19*  GLUCOSE 131*   < > 107* 106* 95 110* 117*  BUN <5*   < > 5* <5* <5* <5* <5*  CREATININE 0.60   < > 0.45 0.55 0.49 0.63 0.57  CALCIUM 8.4*   < > 7.8* 8.1* 8.1* 7.9* 8.0*  MG 1.3*  --   --   --   --  1.8  --    < > = values in this interval not displayed.    GFR: Estimated Creatinine Clearance: 69.2 mL/min (by C-G formula based on SCr of 0.57 mg/dL). Liver Function Tests: Recent Labs  Lab 06/19/22 1146 06/21/22 0404   AST 24 39  ALT 25 27  ALKPHOS 76 83  BILITOT 0.2* 0.8  PROT 7.2 6.9  ALBUMIN 3.8 3.7    Recent Labs  Lab 06/19/22 1146  LIPASE 30    Recent Labs  Lab 06/22/22 0454  AMMONIA 28    Coagulation Profile: Recent Labs  Lab 06/22/22 0454  INR 1.1    Cardiac Enzymes: Recent Labs  Lab 06/21/22 1055  CKTOTAL 913*     Thyroid Function Tests: Recent Labs    06/21/22 0404  TSH 0.703     Sepsis Labs: Recent Labs  Lab 06/21/22 1055 06/23/22 0344  PROCALCITON <0.10 <0.10     Recent Results (from the past 240 hour(s))  Culture, blood (Routine X 2) w Reflex to ID Panel     Status: None (Preliminary result)   Collection Time: 06/21/22 10:49 AM   Specimen: BLOOD  Result Value Ref Range Status   Specimen Description BLOOD RIGHT ANTECUBITAL  Final   Special Requests   Final    BOTTLES DRAWN AEROBIC AND ANAEROBIC Blood Culture adequate volume   Culture   Final    NO GROWTH 2 DAYS Performed at [facility], [address] [place], [place] [address]    Report Status PENDING  Incomplete  Resp panel by RT-PCR (RSV, Flu A&B, Covid) Anterior Nasal Swab     Status: None   Collection Time: 06/22/22  8:43 AM   Specimen: Anterior Nasal Swab  Result Value Ref Range Status   SARS Coronavirus 2 by RT PCR NEGATIVE NEGATIVE Final    Comment: (NOTE) SARS-CoV-2 target nucleic acids are NOT DETECTED.  The SARS-CoV-2 RNA is generally detectable in upper respiratory specimens during the acute phase of infection. The lowest concentration of SARS-CoV-2 viral copies this assay can detect is 138 copies/mL. A negative result does not preclude SARS-Cov-2 infection and should not be used as the sole basis for treatment or other patient management decisions. A negative result may occur with  improper specimen collection/handling, submission of specimen other than nasopharyngeal swab, presence of viral mutation(s) within the areas targeted by this assay, and inadequate number of  viral copies(<138 copies/mL). A negative result must be combined with clinical observations, patient history, and epidemiological information. The expected result is Negative.  Fact Sheet for Patients:  EntrepreneurPulse.com.au  Fact Sheet for Healthcare Providers:  IncredibleEmployment.be  This test is no t yet approved or cleared by the [place] FDA and  has been authorized for detection and/or diagnosis of SARS-CoV-2 by FDA under an Emergency Use Authorization (EUA). This EUA will remain  in effect (meaning this test can be used) for the duration of the COVID-19 declaration under Section 564(b)(1) of the Act, 21 U.S.C.section 360bbb-3(b)(1), unless the authorization is terminated  or revoked sooner.       Influenza A by PCR NEGATIVE NEGATIVE Final   Influenza B by PCR NEGATIVE NEGATIVE Final    Comment: (NOTE) The Xpert Xpress SARS-CoV-2/FLU/RSV plus assay is intended as an aid in the diagnosis of influenza from Nasopharyngeal swab specimens and should not be used as a sole basis for treatment. Nasal washings and aspirates are unacceptable for Xpert Xpress SARS-CoV-2/FLU/RSV testing.  Fact Sheet for Patients: EntrepreneurPulse.com.au  Fact Sheet for Healthcare Providers: IncredibleEmployment.be  This test is not yet approved or cleared by the [place] FDA and has been authorized for detection and/or diagnosis of SARS-CoV-2 by FDA under an Emergency Use Authorization (EUA). This EUA will remain in effect (meaning this test can be used) for the duration of the COVID-19 declaration under Section 564(b)(1) of the Act, 21 U.S.C. section 360bbb-3(b)(1), unless the authorization is terminated or revoked.     Resp Syncytial Virus by PCR NEGATIVE NEGATIVE Final    Comment: (NOTE) Fact Sheet for Patients: EntrepreneurPulse.com.au  Fact Sheet for Healthcare  Providers: IncredibleEmployment.be  This test is not yet approved or cleared by the [place] FDA and has been authorized for detection and/or diagnosis of SARS-CoV-2 by FDA under an Emergency Use Authorization (EUA). This EUA will remain in effect (meaning this test can be used) for the duration of the COVID-19 declaration under Section 564(b)(1) of the Act, 21 U.S.C. section 360bbb-3(b)(1), unless the authorization is terminated or revoked.  Performed at [facility], [place] [address], [place], [place] [address]   Respiratory (~20 pathogens) panel by PCR     Status:  None   Collection Time: 06/22/22  3:36 PM   Specimen: Nasopharyngeal Swab; Respiratory  Result Value Ref Range Status   Adenovirus NOT DETECTED NOT DETECTED Final   Coronavirus 229E NOT DETECTED NOT DETECTED Final    Comment: (NOTE) The Coronavirus on the Respiratory Panel, DOES NOT test for the novel  Coronavirus (2019 nCoV)    Coronavirus HKU1 NOT DETECTED NOT DETECTED Final   Coronavirus NL63 NOT DETECTED NOT DETECTED Final   Coronavirus OC43 NOT DETECTED NOT DETECTED Final   Metapneumovirus NOT DETECTED NOT DETECTED Final   Rhinovirus / Enterovirus NOT DETECTED NOT DETECTED Final   Influenza A NOT DETECTED NOT DETECTED Final   Influenza B NOT DETECTED NOT DETECTED Final   Parainfluenza Virus 1 NOT DETECTED NOT DETECTED Final   Parainfluenza Virus 2 NOT DETECTED NOT DETECTED Final   Parainfluenza Virus 3 NOT DETECTED NOT DETECTED Final   Parainfluenza Virus 4 NOT DETECTED NOT DETECTED Final   Respiratory Syncytial Virus NOT DETECTED NOT DETECTED Final   Bordetella pertussis NOT DETECTED NOT DETECTED Final   Bordetella Parapertussis NOT DETECTED NOT DETECTED Final   Chlamydophila pneumoniae NOT DETECTED NOT DETECTED Final   Mycoplasma pneumoniae NOT DETECTED NOT DETECTED Final    Comment: Performed at [facility], [place]. [address] [place], [place] [address]     Radiology  Studies: No results found.  Scheduled Meds:  budesonide  0.5 mg Inhalation BID   Chlorhexidine Gluconate Cloth  6 each Topical Daily   doxycycline  100 mg Oral Q12H   heparin  5,000 Units Subcutaneous Q8H   potassium chloride  40 mEq Oral Q4H   predniSONE  60 mg Oral Q breakfast   sodium chloride  2 g Oral TID WC   Continuous Infusions:  magnesium sulfate bolus IVPB 2 g (06/23/22 0917)     LOS: 2 days    Bonnielee Haff,  [facility]  If 7PM-7AM, please contact night-coverage [email or web address]  06/23/2022, 9:44 AM

## 2022-06-24 LAB — BASIC METABOLIC PANEL
Anion gap: 7 (ref 5–15)
BUN: <5 mg/dL — ABNORMAL LOW (ref 8–23)
CO2: 25 mmol/L (ref 22–32)
Calcium: 8.4 mg/dL — ABNORMAL LOW (ref 8.9–10.3)
Chloride: 99 mmol/L (ref 98–111)
Creatinine, Ser: 0.46 mg/dL (ref 0.44–1.00)
GFR, Estimated: >60 mL/min (ref >60)
Glucose, Bld: 127 mg/dL — ABNORMAL HIGH (ref 70–99)
Potassium: 3.4 mmol/L — ABNORMAL LOW (ref 3.5–5.1)
Sodium: 131 mmol/L — ABNORMAL LOW (ref 135–145)

## 2022-06-24 LAB — CBC
HCT: 31.4 % — ABNORMAL LOW (ref 36.0–46.0)
Hemoglobin: 11.4 g/dL — ABNORMAL LOW (ref 12.0–15.0)
MCH: 31.1 pg (ref 26.0–34.0)
MCHC: 36.3 g/dL — ABNORMAL HIGH (ref 30.0–36.0)
MCV: 85.8 fL (ref 80.0–100.0)
Platelets: 192 10*3/uL (ref 150–400)
RBC: 3.66 MIL/uL — ABNORMAL LOW (ref 3.87–5.11)
RDW: 12.4 % (ref 11.5–15.5)
WBC: 7.2 10*3/uL (ref 4.0–10.5)
nRBC: 0 % (ref 0.0–0.2)

## 2022-06-24 LAB — OSMOLALITY: Osmolality: 268 mOsm/kg — ABNORMAL LOW (ref 275–295)

## 2022-06-24 LAB — PROCALCITONIN: Procalcitonin: <0.1 ng/mL

## 2022-06-24 MED ORDER — PREDNISONE 20 MG PO TABS: ORAL_TABLET | ORAL | 0 refills | Status: DC

## 2022-06-24 MED ORDER — SODIUM CHLORIDE 1 G PO TABS: 1.0000 g | ORAL_TABLET | Freq: Two times a day (BID) | ORAL | 0 refills | Status: DC

## 2022-06-24 MED ORDER — BENZONATATE 100 MG PO CAPS: 100.0000 mg | ORAL_CAPSULE | Freq: Two times a day (BID) | ORAL | 0 refills | Status: DC | PRN

## 2022-06-24 MED ORDER — DOXYCYCLINE HYCLATE 100 MG PO TABS: 100.0000 mg | ORAL_TABLET | Freq: Two times a day (BID) | ORAL | 0 refills | Status: AC

## 2022-06-24 MED ORDER — FLEET ENEMA 7-19 GM/118ML RE ENEM
1.0000 | ENEMA | Freq: Once | RECTAL | Status: AC
  Administered 2022-06-24: 1 via RECTAL
  Filled 2022-06-24: qty 1

## 2022-06-24 MED ORDER — POTASSIUM CHLORIDE CRYS ER 20 MEQ PO TBCR
40.0000 meq | EXTENDED_RELEASE_TABLET | Freq: Once | ORAL | Status: AC
  Administered 2022-06-24: 40 meq via ORAL
  Filled 2022-06-24: qty 2

## 2022-06-24 MED ORDER — SENNOSIDES-DOCUSATE SODIUM 8.6-50 MG PO TABS: 2.0000 | ORAL_TABLET | Freq: Two times a day (BID) | ORAL | 0 refills | Status: DC

## 2022-06-24 MED ORDER — POLYETHYLENE GLYCOL 3350 17 G PO PACK
17.0000 g | PACK | Freq: Every day | ORAL | Status: DC
  Administered 2022-06-24: 17 g via ORAL
  Filled 2022-06-24: qty 1

## 2022-06-24 MED ORDER — METOPROLOL SUCCINATE ER 100 MG PO TB24
100.0000 mg | ORAL_TABLET | Freq: Every day | ORAL | Status: DC
  Administered 2022-06-24: 100 mg via ORAL
  Filled 2022-06-24: qty 1

## 2022-06-24 MED ORDER — POLYETHYLENE GLYCOL 3350 17 G PO PACK: 17.0000 g | PACK | Freq: Every day | ORAL | 0 refills | Status: DC

## 2022-06-24 NOTE — Progress Notes (Signed)
Mobility Specialist Progress Note:   06/24/22 0950  Mobility  Activity Ambulated with assistance in hallway  Level of Assistance Minimal assist, patient does 75% or more  Assistive Device Front wheel walker  Distance Ambulated (ft) 100 ft  Activity Response Tolerated fair  Mobility Referral Yes  $Mobility charge 1 Mobility   Pt agreeable to mobility session. Required minA at times for steadying, mostly minG for safety. Pt c/o minor dizziness upon standing, which subsided throughout session. HR 110bpm SpO2 94% on RA throughout ambulation. Pt left sitting EOB with OT for session.  Nelta Numbers Mobility Specialist Please contact via SecureChat or  Rehab office at [phone]

## 2022-06-24 NOTE — Plan of Care (Addendum)
Discharge instructions discussed with patient.  Patient instructed on home medications, restrictions, and follow up appointments. Belongings gathered and sent with patient.  Patients medications sent to [facility].   Patient discharged via wheelchair by this Probation officer.   Foley care and extra bag and leg bag sent with patient .

## 2022-06-24 NOTE — TOC Transition Note (Signed)
Transition of Care [facility]) - CM/SW Discharge Note   Patient Details  Name: Katelyn [name] MRN: [id number] Date of Birth: 1960/10/21  [facility] CM/SW Contact:  Verdell Carmine, RN Phone Number: 06/24/2022, 5:09 PM   Clinical Narrative:     Patient had recent PT and OT assessments recommending HH.  Reached out to [facility] who accepted.  RN aware, patient to be discharged. Orders for home health in        Patient Goals and CMS Choice      Discharge Placement                         Discharge Plan and Services Additional resources added to the After Visit Summary for                                       Social Determinants of Health (SDOH) Interventions SDOH Screenings   Food Insecurity: No Food Insecurity (06/22/2022)  Housing: Low Risk  (06/22/2022)  Transportation Needs: No Transportation Needs (06/22/2022)  Utilities: Not At Risk (06/22/2022)  Tobacco Use: High Risk (06/21/2022)     Readmission Risk Interventions     No data to display

## 2022-06-24 NOTE — Discharge Summary (Addendum)
[facility]  Physician Discharge Summary   Patient ID: Katelyn [name] MRN: [id number] DOB/AGE: [birth year] 62 y.o.  Admit date: 06/20/2022 Discharge date:   06/24/2022   PCP: Alease Medina, MD  DISCHARGE DIAGNOSES:    Acute encephalopathy   Essential hypertension   Hyperlipidemia   Acute urinary retention   Acute hyponatremia   Hypokalemia   Hypomagnesemia   COPD (chronic obstructive pulmonary disease) (HCC)   Chronic diastolic CHF (congestive heart failure) (HCC)   Urinary incontinence   Tobacco abuse   RECOMMENDATIONS FOR OUTPATIENT FOLLOW UP: Outpatient follow-up with PCP for further monitoring of sodium levels Outpatient follow-up with urology for urinary retention and Foley management    Home Health: PT/OT Equipment/Devices: None  CODE STATUS: Full code  DISCHARGE CONDITION: fair  Diet recommendation: As before  INITIAL HISTORY: Katelyn [name] is a 62 y.o. female with medical history significant for essential hypertension, hyperlipidemia, COPD, generalized anxiety disorder, chronic tobacco abuse, chronic diastolic heart failure, chronic hyponatremia associated baseline sodium of 125-130, who was admitted to [facility] on 06/20/2022 with acute encephalopathy. Of note patient previously presented to [facility] on 06/19/2022 with acute urinary retention, Foley catheter placed and subsequently discharged home with plan for urology follow-up.     HOSPITAL COURSE:   Acute metabolic encephalopathy Altered mental status thought to be secondary to hyponatremia.   Urine drug screen was positive for benzos and barbiturates with concern for polypharmacy. Patient does not have any focal neurological deficits. Patient also noted to be febrile with no clear infectious source identified. CT head did not show any acute intracranial abnormalities. Mentation has improved and back to baseline.   Hyponatremia Patient with chronic hyponatremia baseline between  125-130.  Came in with sodium level as low as 112.  Urine osmolality was ordered but has not been done yet.  Initially there was concern for hypovolemia and patient was started on IV fluids.  Uric acid level noted to be low at 1.5 raising concern for SIADH. Patient was initially given IV fluids with some improvement in sodium level.  There could have been an element of hypovolemia initially.  Subsequently placed on fluid restriction.  Started on salt tablets.  Sodium level has improved and close to baseline now.  Mentation is also improved. TSH was normal.  Cortisol levels normal.   Fever with cough and wheezing/COPD with acute exacerbation Patient was noted to have mildly elevated WBC.  Procalcitonin has been less than 0.1.  Fever has resolved.  COVID-19, RSV, influenza PCR's have been negative.  Respiratory viral panel was negative.  Chest x-ray did not show any acute findings.   CT scan of the chest abdomen pelvis did not show any acute findings.  Reactive lymphadenopathy noted in the chest cavity. Patient was initially placed on vancomycin and Zosyn.  Blood cultures have been negative.   Patient appears to be experiencing COPD with acute exacerbation.  Antibiotics were changed over to doxycycline.  Steroids were initiated.  Wheezing has improved.  Saturating normal on room air.  Will be discharged on steroid taper.   Hypokalemia/hypomagnesemia Improved.   Subacute urinary retention Recent foley 12/31 placement at [facility] Continue Foley catheter no signs or symptoms of obstruction at this time Supposed to follow-up with urology at [facility].   Chronic diastolic heart failure, not in acute exacerbation; EF 60-65% Was hypovolemic initially.  Now volume status seems to be euvolemic.     Hypertension, essential/Hyperlipidemia Prior to admission she was on amlodipine clonidine, hydrochlorothiazide and  metoprolol.   Resume amlodipine clonidine and metoprolol.  Continue to hold hydrochlorothiazide  due to hyponatremia.     Chronic tobacco use/abuse Counseled to stop smoking  Patient is stable.  Blood pressure was elevated earlier this morning.  Improved after she was given antihypertensives.  Stable for discharge home today.   PERTINENT LABS:  The results of significant diagnostics from this hospitalization (including imaging, microbiology, ancillary and laboratory) are listed below for reference.    Microbiology: Recent Results (from the past 240 hour(s))  Culture, blood (Routine X 2) w Reflex to ID Panel     Status: None (Preliminary result)   Collection Time: 06/21/22 10:49 AM   Specimen: BLOOD  Result Value Ref Range Status   Specimen Description BLOOD RIGHT ANTECUBITAL  Final   Special Requests   Final    BOTTLES DRAWN AEROBIC AND ANAEROBIC Blood Culture adequate volume   Culture   Final    NO GROWTH 3 DAYS Performed at [facility], [address] [place], [place] [address]    Report Status PENDING  Incomplete  Resp panel by RT-PCR (RSV, Flu A&B, Covid) Anterior Nasal Swab     Status: None   Collection Time: 06/22/22  8:43 AM   Specimen: Anterior Nasal Swab  Result Value Ref Range Status   SARS Coronavirus 2 by RT PCR NEGATIVE NEGATIVE Final    Comment: (NOTE) SARS-CoV-2 target nucleic acids are NOT DETECTED.  The SARS-CoV-2 RNA is generally detectable in upper respiratory specimens during the acute phase of infection. The lowest concentration of SARS-CoV-2 viral copies this assay can detect is 138 copies/mL. A negative result does not preclude SARS-Cov-2 infection and should not be used as the sole basis for treatment or other patient management decisions. A negative result may occur with  improper specimen collection/handling, submission of specimen other than nasopharyngeal swab, presence of viral mutation(s) within the areas targeted by this assay, and inadequate number of viral copies(<138 copies/mL). A negative result must be combined with clinical  observations, patient history, and epidemiological information. The expected result is Negative.  Fact Sheet for Patients:  EntrepreneurPulse.com.au  Fact Sheet for Healthcare Providers:  IncredibleEmployment.be  This test is no t yet approved or cleared by the [place] FDA and  has been authorized for detection and/or diagnosis of SARS-CoV-2 by FDA under an Emergency Use Authorization (EUA). This EUA will remain  in effect (meaning this test can be used) for the duration of the COVID-19 declaration under Section 564(b)(1) of the Act, 21 U.S.C.section 360bbb-3(b)(1), unless the authorization is terminated  or revoked sooner.       Influenza A by PCR NEGATIVE NEGATIVE Final   Influenza B by PCR NEGATIVE NEGATIVE Final    Comment: (NOTE) The Xpert Xpress SARS-CoV-2/FLU/RSV plus assay is intended as an aid in the diagnosis of influenza from Nasopharyngeal swab specimens and should not be used as a sole basis for treatment. Nasal washings and aspirates are unacceptable for Xpert Xpress SARS-CoV-2/FLU/RSV testing.  Fact Sheet for Patients: EntrepreneurPulse.com.au  Fact Sheet for Healthcare Providers: IncredibleEmployment.be  This test is not yet approved or cleared by the [place] FDA and has been authorized for detection and/or diagnosis of SARS-CoV-2 by FDA under an Emergency Use Authorization (EUA). This EUA will remain in effect (meaning this test can be used) for the duration of the COVID-19 declaration under Section 564(b)(1) of the Act, 21 U.S.C. section 360bbb-3(b)(1), unless the authorization is terminated or revoked.     Resp Syncytial  Virus by PCR NEGATIVE NEGATIVE Final    Comment: (NOTE) Fact Sheet for Patients: BloggerCourse.comhttps://www.fda.gov/media/152166/download  Fact Sheet for Healthcare Providers: SeriousBroker.ithttps://www.fda.gov/media/152162/download  This test is not yet approved or cleared by  the [place] FDA and has been authorized for detection and/or diagnosis of SARS-CoV-2 by FDA under an Emergency Use Authorization (EUA). This EUA will remain in effect (meaning this test can be used) for the duration of the COVID-19 declaration under Section 564(b)(1) of the Act, 21 U.S.C. section 360bbb-3(b)(1), unless the authorization is terminated or revoked.  Performed at [facility], [address] [place], [place] [address]   Respiratory (~20 pathogens) panel by PCR     Status: None   Collection Time: 06/22/22  3:36 PM   Specimen: Nasopharyngeal Swab; Respiratory  Result Value Ref Range Status   Adenovirus NOT DETECTED NOT DETECTED Final   Coronavirus 229E NOT DETECTED NOT DETECTED Final    Comment: (NOTE) The Coronavirus on the Respiratory Panel, DOES NOT test for the novel  Coronavirus (2019 nCoV)    Coronavirus HKU1 NOT DETECTED NOT DETECTED Final   Coronavirus NL63 NOT DETECTED NOT DETECTED Final   Coronavirus OC43 NOT DETECTED NOT DETECTED Final   Metapneumovirus NOT DETECTED NOT DETECTED Final   Rhinovirus / Enterovirus NOT DETECTED NOT DETECTED Final   Influenza A NOT DETECTED NOT DETECTED Final   Influenza B NOT DETECTED NOT DETECTED Final   Parainfluenza Virus 1 NOT DETECTED NOT DETECTED Final   Parainfluenza Virus 2 NOT DETECTED NOT DETECTED Final   Parainfluenza Virus 3 NOT DETECTED NOT DETECTED Final   Parainfluenza Virus 4 NOT DETECTED NOT DETECTED Final   Respiratory Syncytial Virus NOT DETECTED NOT DETECTED Final   Bordetella pertussis NOT DETECTED NOT DETECTED Final   Bordetella Parapertussis NOT DETECTED NOT DETECTED Final   Chlamydophila pneumoniae NOT DETECTED NOT DETECTED Final   Mycoplasma pneumoniae NOT DETECTED NOT DETECTED Final    Comment: Performed at [facility], [address] [place], [place] [address]     Labs:   Basic Metabolic Panel: Recent Labs  Lab 06/21/22 0404 06/21/22 1055 06/22/22 0234 06/22/22 0454  06/22/22 1536 06/23/22 0344 06/24/22 0610  NA 112*   < > 120* 120* 124* 127* 131*  K 3.3*   < > 3.2* 2.9* 3.3* 3.2* 3.4*  CL 77*   < > 86* 88* 93* 98 99  CO2 23   < > 24 22 22  19* 25  GLUCOSE 131*   < > 106* 95 110* 117* 127*  BUN <5*   < > <5* <5* <5* <5* <5*  CREATININE 0.60   < > 0.55 0.49 0.63 0.57 0.46  CALCIUM 8.4*   < > 8.1* 8.1* 7.9* 8.0* 8.4*  MG 1.3*  --   --   --  1.8  --   --    < > = values in this interval not displayed.   Liver Function Tests: Recent Labs  Lab 06/19/22 1146 06/21/22 0404  AST 24 39  ALT 25 27  ALKPHOS 76 83  BILITOT 0.2* 0.8  PROT 7.2 6.9  ALBUMIN 3.8 3.7   Recent Labs  Lab 06/19/22 1146  LIPASE 30   Recent Labs  Lab 06/22/22 0454  AMMONIA 28   CBC: Recent Labs  Lab 06/21/22 0030 06/21/22 0404 06/22/22 0454 06/23/22 0344 06/24/22 0610  WBC 10.4 12.8* 12.7* 9.2 7.2  NEUTROABS 8.4* 10.8*  --   --   --   HGB 13.2 13.2  12.7 11.6* 11.4*  HCT 37.2 37.6 34.9* 31.1* 31.4*  MCV 85.1 85.5 84.3 84.5 85.8  PLT 192 207 169 170 192   Cardiac Enzymes: Recent Labs  Lab 06/21/22 1055  CKTOTAL 913*   BNP: BNP (last 3 results) Recent Labs    06/21/22 0404  BNP 131.5*      IMAGING STUDIES DG Chest Port 1 View  Result Date: 06/21/2022 CLINICAL DATA:  Altered mental status. EXAM: PORTABLE CHEST 1 VIEW COMPARISON:  08/11/2021 FINDINGS: 0609 hours. The cardiopericardial silhouette is within normal limits for size. The lungs are clear without focal pneumonia, edema, pneumothorax or pleural effusion. Interstitial markings are diffusely coarsened with chronic features. Telemetry leads overlie the chest. IMPRESSION: Chronic interstitial coarsening without acute cardiopulmonary findings. Electronically Signed   By: Misty Stanley M.D.   On: 06/21/2022 06:23   CT Head Wo Contrast  Result Date: 06/21/2022 CLINICAL DATA:  62 year old female altered mental status. Combative. EXAM: CT HEAD WITHOUT CONTRAST TECHNIQUE: Contiguous axial images were  obtained from the base of the skull through the vertex without intravenous contrast. RADIATION DOSE REDUCTION: This exam was performed according to the departmental dose-optimization program which includes automated exposure control, adjustment of the mA and/or kV according to patient size and/or use of iterative reconstruction technique. COMPARISON:  Brain MRI 11/14/2007.  Head CT 08/12/2021. FINDINGS: Brain: Normal cerebral volume. No midline shift, ventriculomegaly, mass effect, evidence of mass lesion, intracranial hemorrhage or evidence of cortically based acute infarction. Gray-white matter differentiation is within normal limits throughout the brain. Vascular: Calcified atherosclerosis at the skull base. No suspicious intracranial vascular hyperdensity. Skull: No acute osseous abnormality identified. Sinuses/Orbits: Visualized paranasal sinuses and mastoids are clear. Other: Broad-based right posterior scalp hematoma on series 3, image 42. Underlying calvarium intact. Other orbit and scalp soft tissues appear negative. IMPRESSION: 1. Right posterior scalp hematoma without underlying skull fracture. 2. Normal noncontrast CT appearance of the brain. Electronically Signed   By: Genevie Ann M.D.   On: 06/21/2022 05:27   CT Angio Chest PE W and/or Wo Contrast  Result Date: 06/19/2022 CLINICAL DATA:  Abdomen pain EXAM: CT ANGIOGRAPHY CHEST CT ABDOMEN AND PELVIS WITH CONTRAST TECHNIQUE: Multidetector CT imaging of the chest was performed using the standard protocol during bolus administration of intravenous contrast. Multiplanar CT image reconstructions and MIPs were obtained to evaluate the vascular anatomy. Multidetector CT imaging of the abdomen and pelvis was performed using the standard protocol during bolus administration of intravenous contrast. RADIATION DOSE REDUCTION: This exam was performed according to the departmental dose-optimization program which includes automated exposure control, adjustment of  the mA and/or kV according to patient size and/or use of iterative reconstruction technique. CONTRAST:  157mL OMNIPAQUE IOHEXOL 350 MG/ML SOLN COMPARISON:  CT 04/26/2022, 04/20/2022, chest CT 08/12/2021 FINDINGS: CTA CHEST FINDINGS Cardiovascular: Satisfactory opacification of the pulmonary arteries to the segmental level. No evidence of pulmonary embolism. Mild aortic atherosclerosis. No aneurysm. No dissection. Mild coronary vascular calcification. Normal cardiac size. No sizable pericardial effusion. Mediastinum/Nodes: Midline trachea. No thyroid mass. Multiple enlarged mediastinal and hilar nodes. Prevascular node measures 13 mm, series 4, image 36. Paratracheal nodes measuring up to 14 mm. Precarinal node measures 17 mm, series 4, image 59. Right inferior hilar node its measuring up to 17 mm, previously 14 mm. Subcarinal lymph node measures 18 mm. Esophagus within normal limits. Small hiatal hernia Lungs/Pleura: Emphysema. No acute airspace disease, pleural effusion, or pneumothorax. Musculoskeletal: Sternum is intact.  No acute osseous abnormality. Review of the MIP images  confirms the above findings. CT ABDOMEN and PELVIS FINDINGS Hepatobiliary: Status post cholecystectomy. No focal hepatic abnormality or biliary dilatation. Pancreas: Unremarkable. No pancreatic ductal dilatation or surrounding inflammatory changes. Spleen: Normal in size without focal abnormality. Adrenals/Urinary Tract: Adrenal glands are unremarkable. Kidneys are normal, without renal calculi, focal lesion, or hydronephrosis. Bladder is decompressed by Foley catheter. Stomach/Bowel: Stomach is within normal limits. Appendix not well seen but no right lower quadrant inflammation. No evidence of bowel wall thickening, distention, or inflammatory changes. Vascular/Lymphatic: Advanced aortic atherosclerosis. No aneurysm. No suspicious lymph nodes. Reproductive: Status post hysterectomy. No adnexal masses. Other: Negative for pelvic effusion or  free air. Musculoskeletal: No acute osseous abnormality. Grade 1 anterolisthesis L4 on L5 with degenerative changes. Review of the MIP images confirms the above findings. IMPRESSION: 1. Negative for acute pulmonary embolus or aortic dissection. Emphysema without acute airspace disease. 2. No CT evidence for acute intra-abdominal or pelvic abnormality. 3. Slight interval increase in mediastinal and right hilar adenopathy compared to prior chest CT, correlate for any history of systemic or lymphoproliferative disease. 4. Aortic atherosclerosis. Aortic Atherosclerosis (ICD10-I70.0) and Emphysema (ICD10-J43.9). Electronically Signed   By: Jasmine Pang M.D.   On: 06/19/2022 17:21   CT ABDOMEN PELVIS W CONTRAST  Result Date: 06/19/2022 CLINICAL DATA:  Abdomen pain EXAM: CT ANGIOGRAPHY CHEST CT ABDOMEN AND PELVIS WITH CONTRAST TECHNIQUE: Multidetector CT imaging of the chest was performed using the standard protocol during bolus administration of intravenous contrast. Multiplanar CT image reconstructions and MIPs were obtained to evaluate the vascular anatomy. Multidetector CT imaging of the abdomen and pelvis was performed using the standard protocol during bolus administration of intravenous contrast. RADIATION DOSE REDUCTION: This exam was performed according to the departmental dose-optimization program which includes automated exposure control, adjustment of the mA and/or kV according to patient size and/or use of iterative reconstruction technique. CONTRAST:  OMNIPAQUE IOHEXOL 350 MG/ML SOLN COMPARISON:  CT 04/26/2022, 04/20/2022, chest CT 08/12/2021 FINDINGS: CTA CHEST FINDINGS Cardiovascular: Satisfactory opacification of the pulmonary arteries to the segmental level. No evidence of pulmonary embolism. Mild aortic atherosclerosis. No aneurysm. No dissection. Mild coronary vascular calcification. Normal cardiac size. No sizable pericardial effusion. Mediastinum/Nodes: Midline trachea. No thyroid mass.  Multiple enlarged mediastinal and hilar nodes. Prevascular node measures 13 mm, series 4, image 36. Paratracheal nodes measuring up to 14 mm. Precarinal node measures 17 mm, series 4, image 59. Right inferior hilar node its measuring up to 17 mm, previously 14 mm. Subcarinal lymph node measures 18 mm. Esophagus within normal limits. Small hiatal hernia Lungs/Pleura: Emphysema. No acute airspace disease, pleural effusion, or pneumothorax. Musculoskeletal: Sternum is intact.  No acute osseous abnormality. Review of the MIP images confirms the above findings. CT ABDOMEN and PELVIS FINDINGS Hepatobiliary: Status post cholecystectomy. No focal hepatic abnormality or biliary dilatation. Pancreas: Unremarkable. No pancreatic ductal dilatation or surrounding inflammatory changes. Spleen: Normal in size without focal abnormality. Adrenals/Urinary Tract: Adrenal glands are unremarkable. Kidneys are normal, without renal calculi, focal lesion, or hydronephrosis. Bladder is decompressed by Foley catheter. Stomach/Bowel: Stomach is within normal limits. Appendix not well seen but no right lower quadrant inflammation. No evidence of bowel wall thickening, distention, or inflammatory changes. Vascular/Lymphatic: Advanced aortic atherosclerosis. No aneurysm. No suspicious lymph nodes. Reproductive: Status post hysterectomy. No adnexal masses. Other: Negative for pelvic effusion or free air. Musculoskeletal: No acute osseous abnormality. Grade 1 anterolisthesis L4 on L5 with degenerative changes. Review of the MIP images confirms the above findings. IMPRESSION: 1. Negative for acute pulmonary embolus  or aortic dissection. Emphysema without acute airspace disease. 2. No CT evidence for acute intra-abdominal or pelvic abnormality. 3. Slight interval increase in mediastinal and right hilar adenopathy compared to prior chest CT, correlate for any history of systemic or lymphoproliferative disease. 4. Aortic atherosclerosis. Aortic  Atherosclerosis (ICD10-I70.0) and Emphysema (ICD10-J43.9). Electronically Signed   By: Jasmine Pang M.D.   On: 06/19/2022 17:21    DISCHARGE EXAMINATION: Vitals:   06/24/22 0500 06/24/22 0838 06/24/22 0847 06/24/22 1030  BP:  (!) 192/104 (!) 192/104 122/78  Pulse:   (!) 118 80  Resp:   20 18  Temp:    98 F (36.7 C)  TempSrc:    Oral  SpO2:      Weight: 72.5 kg     Height:       General appearance: Awake alert.  In no distress Resp: Clear to auscultation bilaterally.  Normal effort Cardio: S1-S2 is normal regular.  No S3-S4.  No rubs murmurs or bruit GI: Abdomen is soft.  Nontender nondistended.  Bowel sounds are present normal.  No masses organomegaly Extremities: No edema.  Full range of motion of lower extremities. Neurologic: Alert and oriented x3.  No focal neurological deficits.    DISPOSITION: Home  Discharge Instructions     Call MD for:  difficulty breathing, headache or visual disturbances   Complete by: As directed    Call MD for:  extreme fatigue   Complete by: As directed    Call MD for:  persistant dizziness or light-headedness   Complete by: As directed    Call MD for:  persistant nausea and vomiting   Complete by: As directed    Call MD for:  severe uncontrolled pain   Complete by: As directed    Call MD for:  temperature >100.4   Complete by: As directed    Diet - low sodium heart healthy   Complete by: As directed    Discharge instructions   Complete by: As directed    Please take your medications as prescribed.  Please be sure to follow-up with urologist for Foley catheter removal.  Contact information should be in your discharge paperwork.  Follow-up with your primary care provider as well.  You were cared for by a hospitalist during your hospital stay. If you have any questions about your discharge medications or the care you received while you were in the hospital after you are discharged, you can call the unit and asked to speak with the  hospitalist on call if the hospitalist that took care of you is not available. Once you are discharged, your primary care physician will handle any further medical issues. Please note that NO REFILLS for any discharge medications will be authorized once you are discharged, as it is imperative that you return to your primary care physician (or establish a relationship with a primary care physician if you do not have one) for your aftercare needs so that they can reassess your need for medications and monitor your lab values. If you do not have a primary care physician, you can call [phone] for a physician referral.   Increase activity slowly   Complete by: As directed          Allergies as of 06/24/2022       Reactions   Ciprofloxacin Other (See Comments)   Headache   Macrobid [nitrofurantoin Monohyd Macro] Itching   Sulfa Antibiotics Itching        Medication List     STOP  taking these medications    hydrochlorothiazide 12.5 MG capsule Commonly known as: MICROZIDE       TAKE these medications    ALPRAZolam 1 MG tablet Commonly known as: XANAX Take 1 mg by mouth 3 (three) times daily.   amLODipine 10 MG tablet Commonly known as: NORVASC Take 1 tablet (10 mg total) by mouth daily.   atorvastatin 40 MG tablet Commonly known as: LIPITOR Take 40 mg by mouth daily.   benzonatate 100 MG capsule Commonly known as: TESSALON Take 1 capsule (100 mg total) by mouth 2 (two) times daily as needed for cough.   butalbital-acetaminophen-caffeine 50-325-40 MG tablet Commonly known as: FIORICET Take 1 tablet by mouth daily as needed for headache or migraine.   cloNIDine 0.1 MG tablet Commonly known as: CATAPRES Take 0.1 mg by mouth 2 (two) times daily.   cyclobenzaprine 10 MG tablet Commonly known as: FLEXERIL Take 10 mg by mouth 3 (three) times daily as needed for muscle spasms.   dicyclomine 10 MG capsule Commonly known as: BENTYL Take 1 capsule (10 mg total) by mouth 3  (three) times daily as needed for spasms.   doxycycline 100 MG tablet Commonly known as: VIBRA-TABS Take 1 tablet (100 mg total) by mouth every 12 (twelve) hours for 5 days.   esomeprazole 40 MG capsule Commonly known as: NEXIUM Take 40 mg by mouth daily at 12 noon.   Flovent HFA 220 MCG/ACT inhaler Generic drug: fluticasone Inhale 2 puffs into the lungs 2 (two) times daily.   FLUoxetine 10 MG capsule Commonly known as: PROZAC Take 10 mg by mouth daily.   gabapentin 300 MG capsule Commonly known as: Neurontin Take 1 capsule (300 mg total) by mouth 3 (three) times daily as needed (Pain).   hydrOXYzine 25 MG tablet Commonly known as: ATARAX Take 25 mg by mouth 3 (three) times daily as needed for anxiety.   metoprolol succinate 100 MG 24 hr tablet Commonly known as: TOPROL-XL Take 100 mg by mouth daily.   ondansetron 4 MG tablet Commonly known as: ZOFRAN Take 1 tablet (4 mg total) by mouth every 4 (four) hours as needed for nausea or vomiting.   polyethylene glycol 17 g packet Commonly known as: MIRALAX / GLYCOLAX Take 17 g by mouth daily.   predniSONE 20 MG tablet Commonly known as: DELTASONE Take 3 tablets once daily for 3 days followed by 2 tablets once daily for 3 days followed by 1 tablet once daily for 3 days and then stop   QUEtiapine 100 MG tablet Commonly known as: SEROQUEL Take 100 mg by mouth 2 (two) times daily.   senna-docusate 8.6-50 MG tablet Commonly known as: Senokot-S Take 2 tablets by mouth 2 (two) times daily.   sodium chloride 1 g tablet Take 1 tablet (1 g total) by mouth 2 (two) times daily with a meal.   Uribel 118 MG Caps Take 1 capsule (118 mg total) by mouth every 6 (six) hours as needed. What changed: reasons to take this   Vitamin D (Ergocalciferol) 1.25 MG (50000 UNIT) Caps capsule Commonly known as: DRISDOL Take 50,000 Units by mouth once a week.          Follow-up Information     Ziglar, Eli PhillipsSusan K, MD. Schedule an  appointment as soon as possible for a visit in 1 week(s).   Specialty: Family Medicine Why: post hospitalization follow up Contact information: [address] [place] [address] [phone]         Apolinar JunesBrandon,  Morrie Sheldon, MD Follow up.   Specialty: Urology Why: please call office to schedule appointment. tell them thet a foley was placed in the emergency room. Contact information: [address] [place] [address] [phone]                 TOTAL DISCHARGE TIME: 35 minutes  Carlethia Mesquita Rito Ehrlich  [facility] Pager on [email or web address]  06/24/2022, 12:13 PM

## 2022-06-24 NOTE — Evaluation (Addendum)
Occupational Therapy Evaluation Patient Details Name: Katelyn [name] MRN: [id number] DOB: 04/01/1961 Today's Date: 06/24/2022   History of Present Illness Katelyn [name] is a 62 y.o. female with medical history significant for essential hypertension, hyperlipidemia, COPD, generalized anxiety disorder, chronic tobacco abuse, chronic diastolic heart failure, chronic hyponatremia associated baseline sodium of 125-130, who was admitted to [facility] on 06/20/2022 with acute encephalopathy.  Also with recent stay for urinary retention now with foley.   Clinical Impression   Patient admitted for the diagnosis above.  Patient returning from a walk with mobility team.  Per the patient, she lives at home with her son, who does not provide any assist for community mobility, medication management, or iADL at home.  Patient stating " I do everything for myself".  Presents very flat, slowed processing, disoriented to time and situation, weak with poor dynamic balance.  Patient has a discharge order, but OT will follow in the acute setting to maximize her functional status, and continue to monitor cognition.  HH OT is recommended for post acute rehab to ensure a safe transition home.  OT recommends 24 hour initial oversight for medications, and driving.  Patient does not appear to have the proper mentation at this time to manage these successfully.        Recommendations for follow up therapy are one component of a multi-disciplinary discharge planning process, led by the attending physician.  Recommendations may be updated based on patient status, additional functional criteria and insurance authorization.   Follow Up Recommendations  Home health OT     Assistance Recommended at Discharge Intermittent Supervision/Assistance  Patient can return home with the following Assist for transportation;Direct supervision/assist for financial management;Direct supervision/assist for medications management     Functional Status Assessment  Patient has had a recent decline in their functional status and demonstrates the ability to make significant improvements in function in a reasonable and predictable amount of time.  Equipment Recommendations  BSC/3in1    Recommendations for Other Services       Precautions / Restrictions Precautions Precautions: Fall Precaution Comments: watch HR Restrictions Weight Bearing Restrictions: No      Mobility Bed Mobility Overal bed mobility: Needs Assistance Bed Mobility: Sit to Supine       Sit to supine: Supervision     Patient Response: Flat affect  Transfers Overall transfer level: Needs assistance Equipment used: Rolling walker (2 wheels) Transfers: Sit to/from Stand Sit to Stand: Min guard                  Balance Overall balance assessment: Needs assistance Sitting-balance support: Feet supported Sitting balance-Leahy Scale: Good     Standing balance support: Reliant on assistive device for balance Standing balance-Leahy Scale: Poor                             ADL either performed or assessed with clinical judgement   ADL       Grooming: Wash/dry hands;Supervision/safety;Standing               Lower Body Dressing: Min guard;Sit to/from stand Lower Body Dressing Details (indicate cue type and reason): very slow and purposeful Toilet Transfer: Supervision/safety;Min guard;Rolling walker (2 wheels);Ambulation                   Vision   Vision Assessment?: No apparent visual deficits     Perception     Praxis  Pertinent Vitals/Pain Pain Assessment Pain Assessment: Faces Faces Pain Scale: Hurts little more Pain Location: low back Pain Descriptors / Indicators: Aching Pain Intervention(s): Monitored during session     Hand Dominance Right   Extremity/Trunk Assessment Upper Extremity Assessment Upper Extremity Assessment: Overall WFL for tasks assessed;LUE  deficits/detail LUE: Shoulder pain with ROM   Lower Extremity Assessment Lower Extremity Assessment: Defer to PT evaluation   Cervical / Trunk Assessment Cervical / Trunk Assessment: Normal   Communication Communication Communication: No difficulties   Cognition Arousal/Alertness: Awake/alert Behavior During Therapy: Flat affect Overall Cognitive Status: No family/caregiver present to determine baseline cognitive functioning Area of Impairment: Orientation, Attention, Memory, Following commands, Awareness, Problem solving                 Orientation Level: Person, Place Current Attention Level: Sustained Memory: Decreased short-term memory Following Commands: Follows one step commands consistently   Awareness: Intellectual Problem Solving: Slow processing, Requires verbal cues General Comments: patient is very flat, unsure about recent events leading to this hospitalization.  Not oriented to situation or time.  Patient needing 24 hour oversight for safety and to ensure medication compliance.  Does not appear patient would be safe to drive given slow processing.     General Comments   HR 110    Exercises     Shoulder Instructions      Home Living Family/patient expects to be discharged to:: Private residence Living Arrangements: Children Available Help at Discharge: Other (Comment) Type of Home: House Home Access: Stairs to enter Entrance Stairs-Number of Steps: 4-5 at side and front entrance, rails on side entrance only   Home Layout: One level     Bathroom Shower/Tub: Occupational psychologist: Handicapped height Bathroom Accessibility: Yes How Accessible: Accessible via walker Home Equipment: [place] (2 wheels);Shower seat - built in          Prior Functioning/Environment Prior Level of Function : Independent/Modified Independent;History of Falls (last six months)             Mobility Comments: thinks she fall prior to  admission ADLs Comments: son does not assist with medications, meals, iADL, or ADL.  Patient states she is driving.        OT Problem List: Decreased strength;Decreased activity tolerance;Impaired balance (sitting and/or standing);Pain;Decreased cognition      OT Treatment/Interventions: Self-care/ADL training;Therapeutic activities;Cognitive remediation/compensation;Balance training;DME and/or AE instruction    OT Goals(Current goals can be found in the care plan section) Acute Rehab OT Goals Patient Stated Goal: Return home OT Goal Formulation: With patient Time For Goal Achievement: 07/08/22 Potential to Achieve Goals: Good ADL Goals Pt Will Perform Grooming: with modified independence;standing Pt Will Perform Lower Body Dressing: with modified independence;sit to/from stand Pt Will Transfer to Toilet: with modified independence;ambulating;regular height toilet Additional ADL Goal #1: Pt will be A&Ox3 with no cues  OT Frequency: Min 2X/week    Co-evaluation              AM-PAC OT "6 Clicks" Daily Activity     Outcome Measure Help from another person eating meals?: None Help from another person taking care of personal grooming?: A Little Help from another person toileting, which includes using toliet, bedpan, or urinal?: A Little Help from another person bathing (including washing, rinsing, drying)?: A Little Help from another person to put on and taking off regular upper body clothing?: None Help from another person to put on and taking off regular lower body clothing?: A Little  6 Click Score: 20   End of Session Equipment Utilized During Treatment: Rolling walker (2 wheels) Nurse Communication: Mobility status  Activity Tolerance: Patient limited by lethargy Patient left: in bed;with call bell/phone within reach;with bed alarm set  OT Visit Diagnosis: Unsteadiness on feet (R26.81);Muscle weakness (generalized) (M62.81);History of falling (Z91.81);Other symptoms and  signs involving cognitive function                Time: 7893-8101 OT Time Calculation (min): 12 min Charges:  OT General Charges $OT Visit: 1 Visit OT Evaluation $OT Eval Moderate Complexity: 1 Mod  06/24/2022  RP, OTR/L  Acute Rehabilitation Services  Office:  [phone]   Metta Clines 06/24/2022, 10:06 AM

## 2022-06-26 LAB — CULTURE, BLOOD (ROUTINE X 2)
Culture: NO GROWTH
Special Requests: ADEQUATE

## 2022-06-29 ENCOUNTER — Encounter: Payer: Self-pay | Admitting: Physician Assistant

## 2022-06-29 ENCOUNTER — Ambulatory Visit (INDEPENDENT_AMBULATORY_CARE_PROVIDER_SITE_OTHER): Payer: Medicaid Other | Admitting: Physician Assistant

## 2022-06-29 ENCOUNTER — Ambulatory Visit: Payer: Medicaid Other | Admitting: Physician Assistant

## 2022-06-29 DIAGNOSIS — R3129 Other microscopic hematuria: Secondary | ICD-10-CM

## 2022-06-29 DIAGNOSIS — R339 Retention of urine, unspecified: Secondary | ICD-10-CM

## 2022-06-29 LAB — BLADDER SCAN AMB NON-IMAGING: Scan Result: 5

## 2022-06-29 NOTE — Progress Notes (Signed)
Catheter Removal  Patient is present today for a catheter removal.  68ml of water was drained from the balloon. A 14FR foley cath was removed from the bladder, no complications were noted. Patient tolerated well.  Performed by: Elberta Leatherwood, CMA  Follow up/ Additional notes: PM PVR

## 2022-06-29 NOTE — Progress Notes (Signed)
06/29/2022 9:28 AM   Katelyn [name] Mar 14, 1961 [id number]  CC: Chief Complaint  Patient presents with   Other   HPI: Katelyn [name] is a 62 y.o. female with PMH recurrent urinary retention since 2018 who previously declined further evaluation; urinary frequency, urgency, and hesitancy; and possible IC who presents today for voiding trial.   She was seen at [facility] ED on 06/19/2022 with reports of acute urinary retention.  A Foley catheter was placed that drained 1000 mL of urine.  UA was notable only for 6-10 RBCs/hpf.  Urine culture was not performed.  Foley catheter removed in the morning, see separate procedure note for details. She returned to clinic this afternoon for PVR. She reports difficulty urinated and appears rather distressed by this. She agreed to UDS referral from Dr. Bernardo Heater at [facility] last month, but this has not yet been scheduled. She reports she has been unable to find a ride to get to [facility] for an appointment. PVR 7mL.  PMH: Past Medical History:  Diagnosis Date   Anxiety    COPD (chronic obstructive pulmonary disease) (HCC)    Depressive disorder    GERD (gastroesophageal reflux disease)    Hiatal hernia    HLD (hyperlipidemia)    Hypertension    IBS (irritable bowel syndrome)    IC (interstitial cystitis)    Migraine    Nicotine dependence    Nontoxic goiter    Overactive bladder    Smoking     Surgical History: Past Surgical History:  Procedure Laterality Date   ABDOMINAL HYSTERECTOMY     ADENOIDECTOMY     CHOLECYSTECTOMY     HERNIA REPAIR     NECK SURGERY     SHOULDER SURGERY     TONSILLECTOMY      Home Medications:  Allergies as of 06/29/2022       Reactions   Ciprofloxacin Other (See Comments)   Headache   Macrobid [nitrofurantoin Monohyd Macro] Itching   Sulfa Antibiotics Itching        Medication List        Accurate as of June 29, 2022  9:28 AM. If you have any questions, ask your nurse or doctor.           ALPRAZolam 1 MG tablet Commonly known as: XANAX Take 1 mg by mouth 3 (three) times daily.   amLODipine 10 MG tablet Commonly known as: NORVASC Take 1 tablet (10 mg total) by mouth daily.   atorvastatin 40 MG tablet Commonly known as: LIPITOR Take 40 mg by mouth daily.   benzonatate 100 MG capsule Commonly known as: TESSALON Take 1 capsule (100 mg total) by mouth 2 (two) times daily as needed for cough.   butalbital-acetaminophen-caffeine 50-325-40 MG tablet Commonly known as: FIORICET Take 1 tablet by mouth daily as needed for headache or migraine.   cloNIDine 0.1 MG tablet Commonly known as: CATAPRES Take 0.1 mg by mouth 2 (two) times daily.   cyclobenzaprine 10 MG tablet Commonly known as: FLEXERIL Take 10 mg by mouth 3 (three) times daily as needed for muscle spasms.   dicyclomine 10 MG capsule Commonly known as: BENTYL Take 1 capsule (10 mg total) by mouth 3 (three) times daily as needed for spasms.   doxycycline 100 MG tablet Commonly known as: VIBRA-TABS Take 1 tablet (100 mg total) by mouth every 12 (twelve) hours for 5 days.   esomeprazole 40 MG capsule Commonly known as: NEXIUM Take 40 mg by mouth daily at 12  noon.   Flovent HFA 220 MCG/ACT inhaler Generic drug: fluticasone Inhale 2 puffs into the lungs 2 (two) times daily.   FLUoxetine 10 MG capsule Commonly known as: PROZAC Take 10 mg by mouth daily.   gabapentin 300 MG capsule Commonly known as: Neurontin Take 1 capsule (300 mg total) by mouth 3 (three) times daily as needed (Pain).   hydrOXYzine 25 MG tablet Commonly known as: ATARAX Take 25 mg by mouth 3 (three) times daily as needed for anxiety.   metoprolol succinate 100 MG 24 hr tablet Commonly known as: TOPROL-XL Take 100 mg by mouth daily.   ondansetron 4 MG tablet Commonly known as: ZOFRAN Take 1 tablet (4 mg total) by mouth every 4 (four) hours as needed for nausea or vomiting.   polyethylene glycol 17 g packet Commonly known  as: MIRALAX / GLYCOLAX Take 17 g by mouth daily.   predniSONE 20 MG tablet Commonly known as: DELTASONE Take 3 tablets once daily for 3 days followed by 2 tablets once daily for 3 days followed by 1 tablet once daily for 3 days and then stop   QUEtiapine 100 MG tablet Commonly known as: SEROQUEL Take 100 mg by mouth 2 (two) times daily.   senna-docusate 8.6-50 MG tablet Commonly known as: Senokot-S Take 2 tablets by mouth 2 (two) times daily.   sodium chloride 1 g tablet Take 1 tablet (1 g total) by mouth 2 (two) times daily with a meal.   Uribel 118 MG Caps Take 1 capsule (118 mg total) by mouth every 6 (six) hours as needed. What changed: reasons to take this   Vitamin D (Ergocalciferol) 1.25 MG (50000 UNIT) Caps capsule Commonly known as: DRISDOL Take 50,000 Units by mouth once a week.        Allergies:  Allergies  Allergen Reactions   Ciprofloxacin Other (See Comments)    Headache   Macrobid [Nitrofurantoin Monohyd Macro] Itching   Sulfa Antibiotics Itching    Family History: Family History  Problem Relation Age of Onset   CAD Father        onset age 60   Heart disease Father    Kidney disease Father    Prostate cancer Father    Colon polyps Maternal Grandmother    Colon cancer Paternal Grandmother    Bladder Cancer Neg Hx    Kidney cancer Neg Hx     Social History:   reports that she has been smoking cigarettes. She has been smoking an average of .5 packs per day. She has never used smokeless tobacco. She reports that she does not drink alcohol and does not use drugs.  Physical Exam: There were no vitals taken for this visit.  Constitutional:  Alert and oriented, no acute distress, nontoxic appearing HEENT: [place], AT Cardiovascular: No clubbing, cyanosis, or edema Respiratory: Normal respiratory effort, no increased work of breathing Skin: No rashes, bruises or suspicious lesions Neurologic: Grossly intact, no focal deficits, moving all 4  extremities Psychiatric: Normal mood and affect  Laboratory Data: Results for orders placed or performed in visit on 06/29/22  Bladder Scan (Post Void Residual) in office  Result Value Ref Range   Scan Result 5    Simple Catheter Placement  Due to urinary retention patient is present today for a foley cath placement.  Patient was cleaned and prepped in a sterile fashion with betadine. A 16 FR foley catheter was inserted, urine return was noted 20ml, urine was yellow in color.  The balloon was filled  with 10cc of sterile water.  A leg bag was attached for drainage. Patient tolerated well, no complications were noted   Performed by: Debroah Loop, PA-C   Assessment & Plan:   1. Urinary retention Voiding trial passed today, though she reports difficulty voiding and is rather distressed about this. We discussed options including surveillance with Foley replacement PRN, CIC teaching, and chronic indwelling Foley at least until she is able to undergo UDS at [facility]. She elected for the latter, see procedure note above. I encouraged her to contact her health insurer to see if they could arrange transportation for her. - Bladder Scan (Post Void Residual) in office  2. Microscopic hematuria Recent history of MH, possibly associated with Foley trauma. Fortunately she has had multiple recent CT Abdomen and pelvis with contrast studies, none of which with significant urinary findings. Recommend cysto for further evaluation, can defer pending UDS  Return in about 4 weeks (around 07/27/2022) for Catheter exchange.  Debroah Loop, PA-C  [facility] [address] [place], [place] [address] [phone]

## 2022-07-01 ENCOUNTER — Emergency Department: Payer: Medicaid Other

## 2022-07-01 ENCOUNTER — Inpatient Hospital Stay
Admission: EM | Admit: 2022-07-01 | Discharge: 2022-07-05 | DRG: 640 | Disposition: A | Discharge status: Home / Self Care | Payer: Medicaid Other | Attending: Student | Admitting: Student

## 2022-07-01 DIAGNOSIS — Z8249 Family history of ischemic heart disease and other diseases of the circulatory system: Secondary | ICD-10-CM

## 2022-07-01 DIAGNOSIS — E785 Hyperlipidemia, unspecified: Secondary | ICD-10-CM | POA: Insufficient documentation

## 2022-07-01 DIAGNOSIS — J432 Centrilobular emphysema: Secondary | ICD-10-CM | POA: Diagnosis not present

## 2022-07-01 DIAGNOSIS — F419 Anxiety disorder, unspecified: Secondary | ICD-10-CM | POA: Diagnosis present

## 2022-07-01 DIAGNOSIS — Z66 Do not resuscitate: Secondary | ICD-10-CM | POA: Diagnosis present

## 2022-07-01 DIAGNOSIS — K219 Gastro-esophageal reflux disease without esophagitis: Secondary | ICD-10-CM | POA: Insufficient documentation

## 2022-07-01 DIAGNOSIS — R112 Nausea with vomiting, unspecified: Secondary | ICD-10-CM | POA: Diagnosis not present

## 2022-07-01 DIAGNOSIS — R578 Other shock: Secondary | ICD-10-CM | POA: Diagnosis not present

## 2022-07-01 DIAGNOSIS — F17210 Nicotine dependence, cigarettes, uncomplicated: Secondary | ICD-10-CM | POA: Diagnosis present

## 2022-07-01 DIAGNOSIS — K529 Noninfective gastroenteritis and colitis, unspecified: Secondary | ICD-10-CM | POA: Diagnosis not present

## 2022-07-01 DIAGNOSIS — G928 Other toxic encephalopathy: Secondary | ICD-10-CM | POA: Diagnosis present

## 2022-07-01 DIAGNOSIS — F32A Depression, unspecified: Secondary | ICD-10-CM | POA: Diagnosis present

## 2022-07-01 DIAGNOSIS — E871 Hypo-osmolality and hyponatremia: Principal | ICD-10-CM | POA: Diagnosis present

## 2022-07-01 DIAGNOSIS — G8929 Other chronic pain: Secondary | ICD-10-CM | POA: Diagnosis present

## 2022-07-01 DIAGNOSIS — E222 Syndrome of inappropriate secretion of antidiuretic hormone: Secondary | ICD-10-CM | POA: Diagnosis not present

## 2022-07-01 DIAGNOSIS — J449 Chronic obstructive pulmonary disease, unspecified: Secondary | ICD-10-CM | POA: Diagnosis present

## 2022-07-01 DIAGNOSIS — K140 Glossitis: Secondary | ICD-10-CM | POA: Diagnosis present

## 2022-07-01 DIAGNOSIS — E876 Hypokalemia: Secondary | ICD-10-CM | POA: Diagnosis not present

## 2022-07-01 DIAGNOSIS — Z79899 Other long term (current) drug therapy: Secondary | ICD-10-CM

## 2022-07-01 DIAGNOSIS — R012 Other cardiac sounds: Secondary | ICD-10-CM | POA: Diagnosis not present

## 2022-07-01 DIAGNOSIS — T43225A Adverse effect of selective serotonin reuptake inhibitors, initial encounter: Secondary | ICD-10-CM | POA: Diagnosis present

## 2022-07-01 DIAGNOSIS — Z888 Allergy status to other drugs, medicaments and biological substances status: Secondary | ICD-10-CM

## 2022-07-01 DIAGNOSIS — G9340 Encephalopathy, unspecified: Secondary | ICD-10-CM | POA: Diagnosis present

## 2022-07-01 DIAGNOSIS — R197 Diarrhea, unspecified: Secondary | ICD-10-CM | POA: Diagnosis not present

## 2022-07-01 DIAGNOSIS — Z882 Allergy status to sulfonamides status: Secondary | ICD-10-CM | POA: Diagnosis not present

## 2022-07-01 DIAGNOSIS — J069 Acute upper respiratory infection, unspecified: Secondary | ICD-10-CM | POA: Diagnosis present

## 2022-07-01 DIAGNOSIS — Z1152 Encounter for screening for COVID-19: Secondary | ICD-10-CM

## 2022-07-01 DIAGNOSIS — I2489 Other forms of acute ischemic heart disease: Secondary | ICD-10-CM | POA: Diagnosis not present

## 2022-07-01 DIAGNOSIS — T471X5A Adverse effect of other antacids and anti-gastric-secretion drugs, initial encounter: Secondary | ICD-10-CM | POA: Diagnosis present

## 2022-07-01 DIAGNOSIS — R339 Retention of urine, unspecified: Secondary | ICD-10-CM

## 2022-07-01 DIAGNOSIS — N3281 Overactive bladder: Secondary | ICD-10-CM | POA: Diagnosis present

## 2022-07-01 DIAGNOSIS — I10 Essential (primary) hypertension: Secondary | ICD-10-CM | POA: Diagnosis present

## 2022-07-01 DIAGNOSIS — E878 Other disorders of electrolyte and fluid balance, not elsewhere classified: Secondary | ICD-10-CM | POA: Diagnosis not present

## 2022-07-01 DIAGNOSIS — I959 Hypotension, unspecified: Secondary | ICD-10-CM | POA: Diagnosis not present

## 2022-07-01 DIAGNOSIS — R0989 Other specified symptoms and signs involving the circulatory and respiratory systems: Secondary | ICD-10-CM | POA: Diagnosis not present

## 2022-07-01 DIAGNOSIS — E875 Hyperkalemia: Secondary | ICD-10-CM | POA: Diagnosis present

## 2022-07-01 DIAGNOSIS — R7989 Other specified abnormal findings of blood chemistry: Secondary | ICD-10-CM | POA: Diagnosis not present

## 2022-07-01 DIAGNOSIS — T39395A Adverse effect of other nonsteroidal anti-inflammatory drugs [NSAID], initial encounter: Secondary | ICD-10-CM | POA: Diagnosis present

## 2022-07-01 DIAGNOSIS — E861 Hypovolemia: Secondary | ICD-10-CM | POA: Diagnosis not present

## 2022-07-01 DIAGNOSIS — Z841 Family history of disorders of kidney and ureter: Secondary | ICD-10-CM

## 2022-07-01 LAB — URINE DRUG SCREEN, QUALITATIVE (ARMC ONLY)
Amphetamines, Ur Screen: NOT DETECTED
Barbiturates, Ur Screen: NOT DETECTED
Benzodiazepine, Ur Scrn: NOT DETECTED
Cannabinoid 50 Ng, Ur ~~LOC~~: NOT DETECTED
Cocaine Metabolite,Ur ~~LOC~~: NOT DETECTED
MDMA (Ecstasy)Ur Screen: NOT DETECTED
Methadone Scn, Ur: NOT DETECTED
Opiate, Ur Screen: NOT DETECTED
Phencyclidine (PCP) Ur S: NOT DETECTED
Tricyclic, Ur Screen: NOT DETECTED

## 2022-07-01 LAB — LACTIC ACID, PLASMA
Lactic Acid, Venous: 1.5 mmol/L (ref 0.5–1.9)
Lactic Acid, Venous: 1.2 mmol/L (ref 0.5–1.9)

## 2022-07-01 LAB — TROPONIN I (HIGH SENSITIVITY)
Troponin I (High Sensitivity): 54 ng/L — ABNORMAL HIGH (ref <18)
Troponin I (High Sensitivity): 40 ng/L — ABNORMAL HIGH (ref <18)

## 2022-07-01 LAB — URINALYSIS, ROUTINE W REFLEX MICROSCOPIC
Bacteria, UA: NONE SEEN
Bilirubin Urine: NEGATIVE
Glucose, UA: 50 mg/dL — AB
Ketones, ur: 5 mg/dL — AB
Leukocytes,Ua: NEGATIVE
Nitrite: NEGATIVE
Protein, ur: NEGATIVE mg/dL
Specific Gravity, Urine: 1.008 (ref 1.005–1.030)
Squamous Epithelial / HPF: NONE SEEN /HPF (ref 0–5)
pH: 7 (ref 5.0–8.0)

## 2022-07-01 LAB — COMPREHENSIVE METABOLIC PANEL
ALT: 53 U/L — ABNORMAL HIGH (ref 0–44)
AST: 42 U/L — ABNORMAL HIGH (ref 15–41)
Albumin: 3.1 g/dL — ABNORMAL LOW (ref 3.5–5.0)
Alkaline Phosphatase: 54 U/L (ref 38–126)
Anion gap: 12 (ref 5–15)
BUN: 8 mg/dL (ref 8–23)
CO2: 26 mmol/L (ref 22–32)
Calcium: 7.1 mg/dL — ABNORMAL LOW (ref 8.9–10.3)
Chloride: 66 mmol/L — ABNORMAL LOW (ref 98–111)
Creatinine, Ser: 0.5 mg/dL (ref 0.44–1.00)
GFR, Estimated: >60 mL/min (ref >60)
Glucose, Bld: 136 mg/dL — ABNORMAL HIGH (ref 70–99)
Potassium: 2.2 mmol/L — CL (ref 3.5–5.1)
Sodium: 104 mmol/L — CL (ref 135–145)
Total Bilirubin: 0.9 mg/dL (ref 0.3–1.2)
Total Protein: 6 g/dL — ABNORMAL LOW (ref 6.5–8.1)

## 2022-07-01 LAB — CBC WITH DIFFERENTIAL/PLATELET
Abs Immature Granulocytes: 0.3 10*3/uL — ABNORMAL HIGH (ref 0.00–0.07)
Basophils Absolute: 0 10*3/uL (ref 0.0–0.1)
Basophils Relative: 0 %
Eosinophils Absolute: 0 10*3/uL (ref 0.0–0.5)
Eosinophils Relative: 0 %
HCT: 32.7 % — ABNORMAL LOW (ref 36.0–46.0)
Hemoglobin: 11.9 g/dL — ABNORMAL LOW (ref 12.0–15.0)
Immature Granulocytes: 2 %
Lymphocytes Relative: 12 %
Lymphs Abs: 2.1 10*3/uL (ref 0.7–4.0)
MCH: 30.1 pg (ref 26.0–34.0)
MCHC: 36.4 g/dL — ABNORMAL HIGH (ref 30.0–36.0)
MCV: 82.6 fL (ref 80.0–100.0)
Monocytes Absolute: 1.2 10*3/uL — ABNORMAL HIGH (ref 0.1–1.0)
Monocytes Relative: 7 %
Neutro Abs: 13.8 10*3/uL — ABNORMAL HIGH (ref 1.7–7.7)
Neutrophils Relative %: 79 %
Platelets: 271 10*3/uL (ref 150–400)
RBC: 3.96 MIL/uL (ref 3.87–5.11)
RDW: 12.1 % (ref 11.5–15.5)
WBC: 17.6 10*3/uL — ABNORMAL HIGH (ref 4.0–10.5)
nRBC: 0 % (ref 0.0–0.2)

## 2022-07-01 LAB — RESP PANEL BY RT-PCR (RSV, FLU A&B, COVID)  RVPGX2
Influenza A by PCR: NEGATIVE
Influenza B by PCR: NEGATIVE
Resp Syncytial Virus by PCR: NEGATIVE
SARS Coronavirus 2 by RT PCR: NEGATIVE

## 2022-07-01 LAB — NA AND K (SODIUM & POTASSIUM), RAND UR
Potassium Urine: 11 mmol/L
Sodium, Ur: 71 mmol/L

## 2022-07-01 LAB — LIPASE, BLOOD: Lipase: 28 U/L (ref 11–51)

## 2022-07-01 LAB — MAGNESIUM: Magnesium: 1.4 mg/dL — ABNORMAL LOW (ref 1.7–2.4)

## 2022-07-01 MED ORDER — FLUTICASONE PROPIONATE HFA 220 MCG/ACT IN AERO: 2.0000 | INHALATION_SPRAY | Freq: Two times a day (BID) | RESPIRATORY_TRACT | Status: DC

## 2022-07-01 MED ORDER — CYCLOBENZAPRINE HCL 10 MG PO TABS: 10.0000 mg | ORAL_TABLET | Freq: Three times a day (TID) | ORAL | Status: DC | PRN

## 2022-07-01 MED ORDER — ACETAMINOPHEN 650 MG RE SUPP: 650.0000 mg | Freq: Four times a day (QID) | RECTAL | Status: DC | PRN

## 2022-07-01 MED ORDER — CLONIDINE HCL 0.1 MG PO TABS
0.1000 mg | ORAL_TABLET | Freq: Two times a day (BID) | ORAL | Status: DC
  Administered 2022-07-01: 0.1 mg via ORAL
  Filled 2022-07-01 (×2): qty 1

## 2022-07-01 MED ORDER — SODIUM CHLORIDE 1 G PO TABS
1.0000 g | ORAL_TABLET | Freq: Two times a day (BID) | ORAL | Status: DC
  Administered 2022-07-02 – 2022-07-05 (×6): 1 g via ORAL
  Filled 2022-07-01 (×7): qty 1

## 2022-07-01 MED ORDER — SODIUM CHLORIDE 0.9 % IV SOLN: INTRAVENOUS | Status: DC

## 2022-07-01 MED ORDER — ONDANSETRON HCL 4 MG PO TABS
4.0000 mg | ORAL_TABLET | Freq: Four times a day (QID) | ORAL | Status: DC | PRN
  Administered 2022-07-03 – 2022-07-04 (×2): 4 mg via ORAL
  Filled 2022-07-01 (×2): qty 1

## 2022-07-01 MED ORDER — POTASSIUM CHLORIDE CRYS ER 20 MEQ PO TBCR
40.0000 meq | EXTENDED_RELEASE_TABLET | Freq: Once | ORAL | Status: AC
  Administered 2022-07-01: 40 meq via ORAL
  Filled 2022-07-01: qty 2

## 2022-07-01 MED ORDER — AMLODIPINE BESYLATE 10 MG PO TABS
10.0000 mg | ORAL_TABLET | Freq: Every day | ORAL | Status: DC
  Administered 2022-07-01: 10 mg via ORAL
  Filled 2022-07-01 (×2): qty 2

## 2022-07-01 MED ORDER — URIBEL 118 MG PO CAPS: 1.0000 | ORAL_CAPSULE | Freq: Four times a day (QID) | ORAL | Status: DC | PRN

## 2022-07-01 MED ORDER — METOPROLOL SUCCINATE ER 50 MG PO TB24
100.0000 mg | ORAL_TABLET | Freq: Every day | ORAL | Status: DC
  Administered 2022-07-01: 100 mg via ORAL
  Filled 2022-07-01 (×2): qty 2

## 2022-07-01 MED ORDER — ATORVASTATIN CALCIUM 20 MG PO TABS
40.0000 mg | ORAL_TABLET | Freq: Every day | ORAL | Status: DC
  Administered 2022-07-02 – 2022-07-05 (×4): 40 mg via ORAL
  Filled 2022-07-01 (×4): qty 2

## 2022-07-01 MED ORDER — GABAPENTIN 300 MG PO CAPS
300.0000 mg | ORAL_CAPSULE | Freq: Three times a day (TID) | ORAL | Status: DC | PRN
  Administered 2022-07-04 – 2022-07-05 (×4): 300 mg via ORAL
  Filled 2022-07-01 (×4): qty 1

## 2022-07-01 MED ORDER — ALPRAZOLAM 0.5 MG PO TABS
1.0000 mg | ORAL_TABLET | Freq: Three times a day (TID) | ORAL | Status: DC
  Administered 2022-07-01 – 2022-07-02 (×2): 1 mg via ORAL
  Filled 2022-07-01 (×2): qty 2

## 2022-07-01 MED ORDER — MAGNESIUM SULFATE 2 GM/50ML IV SOLN
2.0000 g | Freq: Once | INTRAVENOUS | Status: AC
  Administered 2022-07-01: 2 g via INTRAVENOUS
  Filled 2022-07-01: qty 50

## 2022-07-01 MED ORDER — BENZONATATE 100 MG PO CAPS
100.0000 mg | ORAL_CAPSULE | Freq: Two times a day (BID) | ORAL | Status: DC | PRN
  Administered 2022-07-03 – 2022-07-04 (×3): 100 mg via ORAL
  Filled 2022-07-01 (×3): qty 1

## 2022-07-01 MED ORDER — POLYETHYLENE GLYCOL 3350 17 G PO PACK
17.0000 g | PACK | Freq: Every day | ORAL | Status: DC
  Administered 2022-07-01 – 2022-07-05 (×3): 17 g via ORAL
  Filled 2022-07-01 (×5): qty 1

## 2022-07-01 MED ORDER — IOHEXOL 300 MG/ML  SOLN
100.0000 mL | Freq: Once | INTRAMUSCULAR | Status: AC | PRN
  Administered 2022-07-01: 100 mL via INTRAVENOUS

## 2022-07-01 MED ORDER — VITAMIN D (ERGOCALCIFEROL) 1.25 MG (50000 UNIT) PO CAPS
50000.0000 [IU] | ORAL_CAPSULE | ORAL | Status: DC
  Administered 2022-07-02: 50000 [IU] via ORAL
  Filled 2022-07-01: qty 1

## 2022-07-01 MED ORDER — ONDANSETRON HCL 4 MG/2ML IJ SOLN
4.0000 mg | Freq: Four times a day (QID) | INTRAMUSCULAR | Status: DC | PRN
  Administered 2022-07-01: 4 mg via INTRAVENOUS
  Filled 2022-07-01: qty 2

## 2022-07-01 MED ORDER — BUDESONIDE 0.5 MG/2ML IN SUSP
0.5000 mg | Freq: Two times a day (BID) | RESPIRATORY_TRACT | Status: DC
  Administered 2022-07-02 – 2022-07-05 (×6): 0.5 mg via RESPIRATORY_TRACT
  Filled 2022-07-01 (×7): qty 2

## 2022-07-01 MED ORDER — FLUOXETINE HCL 10 MG PO CAPS
10.0000 mg | ORAL_CAPSULE | Freq: Every day | ORAL | Status: DC
  Administered 2022-07-02 – 2022-07-05 (×4): 10 mg via ORAL
  Filled 2022-07-01 (×4): qty 1

## 2022-07-01 MED ORDER — QUETIAPINE FUMARATE 25 MG PO TABS
100.0000 mg | ORAL_TABLET | Freq: Two times a day (BID) | ORAL | Status: DC
  Administered 2022-07-01 – 2022-07-02 (×3): 100 mg via ORAL
  Filled 2022-07-01 (×3): qty 4

## 2022-07-01 MED ORDER — SODIUM CHLORIDE 0.9 % IV BOLUS
500.0000 mL | Freq: Once | INTRAVENOUS | Status: AC
  Administered 2022-07-01: 500 mL via INTRAVENOUS

## 2022-07-01 MED ORDER — TRAZODONE HCL 50 MG PO TABS
25.0000 mg | ORAL_TABLET | Freq: Every evening | ORAL | Status: DC | PRN
  Administered 2022-07-03 – 2022-07-04 (×3): 25 mg via ORAL
  Filled 2022-07-01 (×3): qty 1

## 2022-07-01 MED ORDER — PANTOPRAZOLE SODIUM 40 MG PO TBEC
40.0000 mg | DELAYED_RELEASE_TABLET | Freq: Every day | ORAL | Status: DC
  Administered 2022-07-01 – 2022-07-05 (×5): 40 mg via ORAL
  Filled 2022-07-01 (×5): qty 1

## 2022-07-01 MED ORDER — POTASSIUM CHLORIDE 10 MEQ/100ML IV SOLN
10.0000 meq | INTRAVENOUS | Status: AC
  Administered 2022-07-01 (×2): 10 meq via INTRAVENOUS
  Filled 2022-07-01 (×2): qty 100

## 2022-07-01 MED ORDER — SENNOSIDES-DOCUSATE SODIUM 8.6-50 MG PO TABS
2.0000 | ORAL_TABLET | Freq: Two times a day (BID) | ORAL | Status: DC
  Administered 2022-07-01 – 2022-07-05 (×7): 2 via ORAL
  Filled 2022-07-01 (×7): qty 2

## 2022-07-01 MED ORDER — ENOXAPARIN SODIUM 40 MG/0.4ML IJ SOSY
40.0000 mg | PREFILLED_SYRINGE | INTRAMUSCULAR | Status: DC
  Administered 2022-07-01 – 2022-07-04 (×4): 40 mg via SUBCUTANEOUS
  Filled 2022-07-01 (×4): qty 0.4

## 2022-07-01 MED ORDER — MAGNESIUM HYDROXIDE 400 MG/5ML PO SUSP
30.0000 mL | Freq: Every day | ORAL | Status: DC | PRN
  Administered 2022-07-01: 30 mL via ORAL
  Filled 2022-07-01: qty 30

## 2022-07-01 MED ORDER — HYDROXYZINE HCL 25 MG PO TABS: 25.0000 mg | ORAL_TABLET | Freq: Three times a day (TID) | ORAL | Status: DC | PRN

## 2022-07-01 MED ORDER — DICYCLOMINE HCL 10 MG PO CAPS
10.0000 mg | ORAL_CAPSULE | Freq: Three times a day (TID) | ORAL | Status: DC | PRN
  Administered 2022-07-01: 10 mg via ORAL
  Filled 2022-07-01: qty 1

## 2022-07-01 MED ORDER — ACETAMINOPHEN 325 MG PO TABS
650.0000 mg | ORAL_TABLET | Freq: Four times a day (QID) | ORAL | Status: DC | PRN
  Administered 2022-07-02 – 2022-07-04 (×6): 650 mg via ORAL
  Filled 2022-07-01 (×6): qty 2

## 2022-07-01 MED ORDER — BUTALBITAL-APAP-CAFFEINE 50-325-40 MG PO TABS
1.0000 | ORAL_TABLET | Freq: Every day | ORAL | Status: DC | PRN
  Administered 2022-07-03: 1 via ORAL
  Filled 2022-07-01: qty 1

## 2022-07-01 NOTE — H&P (Signed)
[place]   PATIENT NAME: Katelyn [name]    MR#:  [id number]  DATE OF BIRTH:  06-17-1961  DATE OF ADMISSION:  07/01/2022  PRIMARY CARE PHYSICIAN: Girtha Rm Eli Phillips, MD   Patient is coming from: Home  REQUESTING/REFERRING PHYSICIAN: Artis Delay, MD  CHIEF COMPLAINT:  Feeling sick, recurrent nausea and vomiting  HISTORY OF PRESENT ILLNESS:  Katelyn [name] is a 62 y.o. Caucasian female with medical history significant for anxiety, depression, GERD, hypertension, dyslipidemia and tobacco abuse, who presented to the emergency room with acute onset of recurrent nausea and vomiting with diarrhea since Monday.  She admitted to chills without fever.  She denies any abdominal pain.  She has been having diminished urine output.  She admits to occasional cough and wheezing but not beyond her smoker's cough.  No chest pain or palpitations.  No dysuria or hematuria or urgency or frequency or flank pain.  She admitted to drinking a lot of water lately.  ED Course: When the patient came to the ER, BP was 172/97 with respiratory rate of 25 with otherwise normal vital signs.  Labs revealed sodium of 104 with potassium of 2.2, magnesium 1.4, and chloride of 66 with albumin of 3.1 and total protein of 6.  AST was 42 and ALT 53.  High sensitive troponin I was 54 and later 40.  Lactic acid was 1.5 and later 1.2.  CBC showed leukocytosis 17.6 with neutrophilia and hemoglobin was 11.9 with hematocrit 32.7.  Urinalysis showed 50 glucose was otherwise unremarkable.  Urine drug screen came back negative blood cultures were drawn. EKG as reviewed by me : EKG showed sinus rhythm with right atrial enlargement, left axis deviation and prolonged QT interval with QTc of 527 MS. Imaging: Abdominal pelvic CT scan revealed distention of the bladder despite Foley catheter in place this resolved within fullness of the collecting system as well.  It showed new small pericardial effusion and small hiatal hernia with reflux.   Noncontrasted CT scan revealed somewhat limited exam due to patient motion artifact with no acute abnormality.  Portable chest x-ray showed no acute cardiopulmonary disease.  The patient was given 2 g of IV magnesium sulfate, 40 mill equivalent p.o. potassium chloride and 10 mill equivalent IV with 500 mL IV normal saline bolus.  She will be admitted to stepdown unit bed for further evaluation and management. PAST MEDICAL HISTORY:   Past Medical History:  Diagnosis Date   Anxiety    COPD (chronic obstructive pulmonary disease) (HCC)    Depressive disorder    GERD (gastroesophageal reflux disease)    Hiatal hernia    HLD (hyperlipidemia)    Hypertension    IBS (irritable bowel syndrome)    IC (interstitial cystitis)    Migraine    Nicotine dependence    Nontoxic goiter    Overactive bladder    Smoking     PAST SURGICAL HISTORY:   Past Surgical History:  Procedure Laterality Date   ABDOMINAL HYSTERECTOMY     ADENOIDECTOMY     CHOLECYSTECTOMY     HERNIA REPAIR     NECK SURGERY     SHOULDER SURGERY     TONSILLECTOMY      SOCIAL HISTORY:   Social History   Tobacco Use   Smoking status: Every Day    Packs/day: 0.50    Types: Cigarettes   Smokeless tobacco: Never  Substance Use Topics   Alcohol use: No    FAMILY HISTORY:  Family History  Problem Relation Age of Onset   CAD Father        onset age 1   Heart disease Father    Kidney disease Father    Prostate cancer Father    Colon polyps Maternal Grandmother    Colon cancer Paternal Grandmother    Bladder Cancer Neg Hx    Kidney cancer Neg Hx     DRUG ALLERGIES:   Allergies  Allergen Reactions   Ciprofloxacin Other (See Comments)    Headache   Macrobid [Nitrofurantoin Monohyd Macro] Itching   Sulfa Antibiotics Itching    REVIEW OF SYSTEMS:   ROS As per history of present illness. All pertinent systems were reviewed above. Constitutional, HEENT, cardiovascular, respiratory, GI, GU,  musculoskeletal, neuro, psychiatric, endocrine, integumentary and hematologic systems were reviewed and are otherwise negative/unremarkable except for positive findings mentioned above in the HPI.  MEDICATIONS AT HOME:   Prior to Admission medications   Medication Sig Start Date End Date Taking? Authorizing Provider  ALPRAZolam Duanne Moron) 1 MG tablet Take 1 mg by mouth 3 (three) times daily. 03/22/22  Yes [provider]  amLODipine (NORVASC) 10 MG tablet Take 1 tablet (10 mg total) by mouth daily. 04/15/21  Yes Agbor-Etang, Aaron Edelman, MD  atorvastatin (LIPITOR) 40 MG tablet Take 40 mg by mouth daily. 01/01/15  Yes [provider]  cloNIDine (CATAPRES) 0.1 MG tablet Take 0.1 mg by mouth 2 (two) times daily. 04/29/20  Yes [provider]  esomeprazole (NEXIUM) 40 MG capsule Take 40 mg by mouth daily at 12 noon.   Yes [provider]  FLOVENT HFA 220 MCG/ACT inhaler Inhale 2 puffs into the lungs 2 (two) times daily. 08/15/20  Yes [provider]  FLUoxetine (PROZAC) 10 MG capsule Take 10 mg by mouth daily.   Yes [provider]  metoprolol succinate (TOPROL-XL) 100 MG 24 hr tablet Take 100 mg by mouth daily. 01/28/22  Yes [provider]  polyethylene glycol (MIRALAX / GLYCOLAX) 17 g packet Take 17 g by mouth daily. 06/24/22  Yes Bonnielee Haff, MD  predniSONE (DELTASONE) 20 MG tablet Take 3 tablets once daily for 3 days followed by 2 tablets once daily for 3 days followed by 1 tablet once daily for 3 days and then stop 06/24/22  Yes Bonnielee Haff, MD  QUEtiapine (SEROQUEL) 100 MG tablet Take 100 mg by mouth 2 (two) times daily. 02/18/22  Yes [provider]  senna-docusate (SENOKOT-S) 8.6-50 MG tablet Take 2 tablets by mouth 2 (two) times daily. 06/24/22  Yes Bonnielee Haff, MD  sodium chloride 1 g tablet Take 1 tablet (1 g total) by mouth 2 (two) times daily with a meal. 06/24/22  Yes Bonnielee Haff, MD  Vitamin D, Ergocalciferol, (DRISDOL)  1.25 MG (50000 UNIT) CAPS capsule Take 50,000 Units by mouth once a week. 03/13/22  Yes [provider]  benzonatate (TESSALON) 100 MG capsule Take 1 capsule (100 mg total) by mouth 2 (two) times daily as needed for cough. 06/24/22   Bonnielee Haff, MD  butalbital-acetaminophen-caffeine (FIORICET) [phone] MG tablet Take 1 tablet by mouth daily as needed for headache or migraine. 04/03/19   [provider]  cyclobenzaprine (FLEXERIL) 10 MG tablet Take 10 mg by mouth 3 (three) times daily as needed for muscle spasms. 03/25/22   [provider]  dicyclomine (BENTYL) 10 MG capsule Take 1 capsule (10 mg total) by mouth 3 (three) times daily as needed for spasms. 03/03/22   Harvest Dark, MD  gabapentin (NEURONTIN) 300 MG capsule Take 1 capsule (300 mg total) by mouth 3 (three) times daily as needed (Pain). 06/19/22 06/19/23  Phineas Semen, MD  hydrOXYzine (ATARAX) 25 MG tablet Take 25 mg by mouth 3 (three) times daily as needed for anxiety.    [provider]  Meth-Hyo-M Bl-Na Phos-Ph Sal (URIBEL) 118 MG CAPS Take 1 capsule (118 mg total) by mouth every 6 (six) hours as needed. Patient taking differently: Take 1 capsule by mouth every 6 (six) hours as needed (pain). 05/24/22   Michiel Cowboy A, PA-C  ondansetron (ZOFRAN) 4 MG tablet Take 1 tablet (4 mg total) by mouth every 4 (four) hours as needed for nausea or vomiting. 04/20/22   Sloan Leiter, DO      VITAL SIGNS:  Blood pressure 109/61, pulse 93, temperature (!) 97.5 F (36.4 C), temperature source Oral, resp. rate 18, height 5\' 4"  (1.626 m), weight 68 kg, SpO2 97 %.  PHYSICAL EXAMINATION:  Physical Exam  GENERAL:  62 y.o.-year-old Caucasian female patient lying in the bed with no acute distress.  EYES: Pupils equal, round, reactive to light and accommodation. No scleral icterus. Extraocular muscles intact.  HEENT: Head atraumatic, normocephalic. Oropharynx with slightly dry mucous membrane and tongue  and nasopharynx clear.  NECK:  Supple, no jugular venous distention. No thyroid enlargement, no tenderness.  LUNGS: Normal breath sounds bilaterally, no wheezing, rales,rhonchi or crepitation. No use of accessory muscles of respiration.  CARDIOVASCULAR: Regular rate and rhythm, S1, S2 normal. No murmurs, rubs, or gallops.  ABDOMEN: Soft, nondistended, nontender. Bowel sounds present. No organomegaly or mass.  EXTREMITIES: No pedal edema, cyanosis, or clubbing.  NEUROLOGIC: Cranial nerves II through XII are intact. Muscle strength 5/5 in all extremities. Sensation intact. Gait not checked.  PSYCHIATRIC: The patient is alert and oriented x 3.  Normal affect and good eye contact. SKIN: No obvious rash, lesion, or ulcer.   LABORATORY PANEL:   CBC Recent Labs  Lab 07/01/22 1859  WBC 17.6*  HGB 11.9*  HCT 32.7*  PLT 271   ------------------------------------------------------------------------------------------------------------------  Chemistries  Recent Labs  Lab 07/01/22 1859 07/01/22 2238  NA 104* 114*  K 2.2* <2.0*  CL 66* 84*  CO2 26 22  GLUCOSE 136* 81  BUN 8 6*  CREATININE 0.50 0.38*  CALCIUM 7.1* 5.6*  MG 1.4*  --   AST 42*  --   ALT 53*  --   ALKPHOS 54  --   BILITOT 0.9  --    ------------------------------------------------------------------------------------------------------------------  Cardiac Enzymes No results for input(s): "TROPONINI" in the last 168 hours. ------------------------------------------------------------------------------------------------------------------  RADIOLOGY:  CT HEAD WO CONTRAST (08/30/22)  Result Date: 07/01/2022 CLINICAL DATA:  Recent syncopal episode and headaches, initial encounter EXAM: CT HEAD WITHOUT CONTRAST TECHNIQUE: Contiguous axial images were obtained from the base of the skull through the vertex without intravenous contrast. RADIATION DOSE REDUCTION: This exam was performed according to the departmental dose-optimization  program which includes automated exposure control, adjustment of the mA and/or kV according to patient size and/or use of iterative reconstruction technique. COMPARISON:  06/21/2022 FINDINGS: Brain: Examination is somewhat limited by patient motion artifact. No findings to suggest acute hemorrhage, acute infarction or space-occupying mass lesion are noted. Vascular: No hyperdense vessel or unexpected calcification. Skull: Normal. Negative for fracture or focal lesion. Sinuses/Orbits: No acute finding. Other: None. IMPRESSION: Somewhat limited exam due to patient motion artifact. No acute abnormality is noted. Electronically Signed   By: 08/20/2022 M.D.   On: 07/01/2022  20:50   CT ABDOMEN PELVIS W CONTRAST  Result Date: 07/01/2022 CLINICAL DATA:  Acute abdominal pain and vomiting, initial encounter EXAM: CT ABDOMEN AND PELVIS WITH CONTRAST TECHNIQUE: Multidetector CT imaging of the abdomen and pelvis was performed using the standard protocol following bolus administration of intravenous contrast. RADIATION DOSE REDUCTION: This exam was performed according to the departmental dose-optimization program which includes automated exposure control, adjustment of the mA and/or kV according to patient size and/or use of iterative reconstruction technique. CONTRAST:  OMNIPAQUE IOHEXOL 300 MG/ML  SOLN COMPARISON:  06/19/2022 FINDINGS: Lower chest: Lung bases demonstrate diffuse emphysematous changes. Small pericardial effusion is noted new from the prior exam. Fluid is noted in the distal esophagus likely related to combination of hiatal hernia and reflux. Hepatobiliary: No focal liver abnormality is seen. Status post cholecystectomy. No biliary dilatation. Pancreas: Unremarkable. No pancreatic ductal dilatation or surrounding inflammatory changes. Spleen: Normal in size without focal abnormality. Adrenals/Urinary Tract: Adrenal glands are within normal limits. Kidneys demonstrate a normal enhancement pattern  bilaterally. Mild fullness of the collecting systems and ureters is seen bilaterally likely related to a distended bladder as no mass or stone is seen. The bladder is well distended. A Foley catheter is noted in place however. Stomach/Bowel: No obstructive or inflammatory changes of colon are seen. Appendix is within normal limits. Small bowel is within normal limits. Stomach is distended with fluid. Vascular/Lymphatic: Aortic atherosclerosis. No enlarged abdominal or pelvic lymph nodes. Reproductive: Status post hysterectomy. No adnexal masses. Other: No abdominal wall hernia or abnormality. No abdominopelvic ascites. Musculoskeletal: Degenerative changes of lumbar spine are noted. Mild anterolisthesis of L4 on L5 is noted. IMPRESSION: Distention of the bladder despite a Foley catheter in place. This results in fullness of the collecting systems as well. New small pericardial effusion. Small hiatal hernia and reflux. Electronically Signed   By: Alcide Clever M.D.   On: 07/01/2022 20:47   DG Chest Portable 1 View  Result Date: 07/01/2022 CLINICAL DATA:  Vomiting and syncopal episode, initial encounter EXAM: PORTABLE CHEST 1 VIEW COMPARISON:  06/21/2022 FINDINGS: The heart size and mediastinal contours are within normal limits. Both lungs are clear. The visualized skeletal structures are unremarkable. IMPRESSION: No active disease. Electronically Signed   By: Alcide Clever M.D.   On: 07/01/2022 19:06      IMPRESSION AND PLAN:  Assessment and Plan: * Hyponatremia - The patient will be admitted to a stepdown unit bed. - We will continue hydration with IV normal saline. - We will follow serial BMPs. - We will obtain hyponatremia workup. - We will apply fluid restriction.  Hypokalemia - Aggressive potassium replacement and will follow-up.  Hypomagnesemia - Magnesium will be replaced and followed.  Acute gastroenteritis - This is likely the culprit for her electrolyte abnormalities.  GERD without  esophagitis - We will continue PPI therapy.  Anxiety and depression - We will continue Xanax and Prozac as well as Seroquel.  Dyslipidemia - We will continue statin therapy.  Essential hypertension - We will continue antihypertensives.       DVT prophylaxis: Lovenox.  Advanced Care Planning:  Code Status: This was discussed with the patient and she desires to be DNR/DNI.Marland Kitchen  Family Communication:  The plan of care was discussed in details with the patient (and family). I answered all questions. The patient agreed to proceed with the above mentioned plan. Further management will depend upon hospital course. Disposition Plan: Back to previous home environment Consults called: none.  All the records are  reviewed and case discussed with ED provider.  Status is: Inpatient   At the time of the admission, it appears that the appropriate admission status for this patient is inpatient.  This is judged to be reasonable and necessary in order to provide the required intensity of service to ensure the patient's safety given the presenting symptoms, physical exam findings and initial radiographic and laboratory data in the context of comorbid conditions.  The patient requires inpatient status due to high intensity of service, high risk of further deterioration and high frequency of surveillance required.  I certify that at the time of admission, it is my clinical judgment that the patient will require inpatient hospital care extending more than 2 midnights.                            Dispo: The patient is from: Home              Anticipated d/c is to: Home              Patient currently is not medically stable to d/c.              Difficult to place patient: No  Christel Mormon M.D on 07/02/2022 at 12:22 AM  [facility]   From 7 PM-7 AM, contact night-coverage [email or web address]  CC: Primary care physician; Ziglar, Lincoln Brigham, MD

## 2022-07-01 NOTE — H&P (Incomplete)
[place]   PATIENT NAME: Katelyn [name]    MR#:  [id number]  DATE OF BIRTH:  29-Mar-1961  DATE OF ADMISSION:  07/01/2022  PRIMARY CARE PHYSICIAN: Ziglar, Lincoln Brigham, MD   Patient is coming from: Home  REQUESTING/REFERRING PHYSICIAN: Marjean Donna, MD  CHIEF COMPLAINT:  Feeling sick, recurrent nausea and vomiting  HISTORY OF PRESENT ILLNESS:  Katelyn [name] is a 62 y.o. female with medical history significant for anxiety, depression, GERD, hypertension, dyslipidemia and tobacco abuse, who presented to the emergency room with acute onset of recurrent nausea and vomiting with diarrhea since Monday.  She admitted to chills without fever.  She denies any abdominal pain.  She has been having diminished urine output.  She admits to occasional cough and wheezing but not beyond her smoker's cough.  No chest pain or palpitations.  No dysuria or hematuria or urgency or frequency or flank pain.  She admitted to drinking a lot of water lately.  ED Course: When the patient came to the ER, BP was 172/97 with respiratory rate of 25 with otherwise normal vital signs.  Labs revealed EKG as reviewed by me : *** Imaging: *** PAST MEDICAL HISTORY:   Past Medical History:  Diagnosis Date  . Anxiety   . COPD (chronic obstructive pulmonary disease) ([place])   . Depressive disorder   . GERD (gastroesophageal reflux disease)   . Hiatal hernia   . HLD (hyperlipidemia)   . Hypertension   . IBS (irritable bowel syndrome)   . IC (interstitial cystitis)   . Migraine   . Nicotine dependence   . Nontoxic goiter   . Overactive bladder   . Smoking     PAST SURGICAL HISTORY:   Past Surgical History:  Procedure Laterality Date  . ABDOMINAL HYSTERECTOMY    . ADENOIDECTOMY    . CHOLECYSTECTOMY    . HERNIA REPAIR    . NECK SURGERY    . SHOULDER SURGERY    . TONSILLECTOMY      SOCIAL HISTORY:   Social History   Tobacco Use  . Smoking status: Every Day    Packs/day: 0.50    Types: Cigarettes   . Smokeless tobacco: Never  Substance Use Topics  . Alcohol use: No    FAMILY HISTORY:   Family History  Problem Relation Age of Onset  . CAD Father        onset age 50  . Heart disease Father   . Kidney disease Father   . Prostate cancer Father   . Colon polyps Maternal Grandmother   . Colon cancer Paternal Grandmother   . Bladder Cancer Neg Hx   . Kidney cancer Neg Hx     DRUG ALLERGIES:   Allergies  Allergen Reactions  . Ciprofloxacin Other (See Comments)    Headache  . Macrobid [Nitrofurantoin Monohyd Macro] Itching  . Sulfa Antibiotics Itching    REVIEW OF SYSTEMS:   ROS As per history of present illness. All pertinent systems were reviewed above. Constitutional, HEENT, cardiovascular, respiratory, GI, GU, musculoskeletal, neuro, psychiatric, endocrine, integumentary and hematologic systems were reviewed and are otherwise negative/unremarkable except for positive findings mentioned above in the HPI.   MEDICATIONS AT HOME:   Prior to Admission medications   Medication Sig Start Date End Date Taking? Authorizing Provider  ALPRAZolam Duanne Moron) 1 MG tablet Take 1 mg by mouth 3 (three) times daily. 03/22/22  Yes [provider]  amLODipine (NORVASC) 10 MG tablet Take 1 tablet (10 mg  total) by mouth daily. 04/15/21  Yes Agbor-Etang, Aaron Edelman, MD  atorvastatin (LIPITOR) 40 MG tablet Take 40 mg by mouth daily. 01/01/15  Yes [provider]  cloNIDine (CATAPRES) 0.1 MG tablet Take 0.1 mg by mouth 2 (two) times daily. 04/29/20  Yes [provider]  esomeprazole (NEXIUM) 40 MG capsule Take 40 mg by mouth daily at 12 noon.   Yes [provider]  FLOVENT HFA 220 MCG/ACT inhaler Inhale 2 puffs into the lungs 2 (two) times daily. 08/15/20  Yes [provider]  FLUoxetine (PROZAC) 10 MG capsule Take 10 mg by mouth daily.   Yes [provider]  metoprolol succinate (TOPROL-XL) 100 MG 24 hr tablet Take 100 mg by mouth daily. 01/28/22   Yes [provider]  polyethylene glycol (MIRALAX / GLYCOLAX) 17 g packet Take 17 g by mouth daily. 06/24/22  Yes Bonnielee Haff, MD  predniSONE (DELTASONE) 20 MG tablet Take 3 tablets once daily for 3 days followed by 2 tablets once daily for 3 days followed by 1 tablet once daily for 3 days and then stop 06/24/22  Yes Bonnielee Haff, MD  QUEtiapine (SEROQUEL) 100 MG tablet Take 100 mg by mouth 2 (two) times daily. 02/18/22  Yes [provider]  senna-docusate (SENOKOT-S) 8.6-50 MG tablet Take 2 tablets by mouth 2 (two) times daily. 06/24/22  Yes Bonnielee Haff, MD  sodium chloride 1 g tablet Take 1 tablet (1 g total) by mouth 2 (two) times daily with a meal. 06/24/22  Yes Bonnielee Haff, MD  Vitamin D, Ergocalciferol, (DRISDOL) 1.25 MG (50000 UNIT) CAPS capsule Take 50,000 Units by mouth once a week. 03/13/22  Yes [provider]  benzonatate (TESSALON) 100 MG capsule Take 1 capsule (100 mg total) by mouth 2 (two) times daily as needed for cough. 06/24/22   Bonnielee Haff, MD  butalbital-acetaminophen-caffeine (FIORICET) [phone] MG tablet Take 1 tablet by mouth daily as needed for headache or migraine. 04/03/19   [provider]  cyclobenzaprine (FLEXERIL) 10 MG tablet Take 10 mg by mouth 3 (three) times daily as needed for muscle spasms. 03/25/22   [provider]  dicyclomine (BENTYL) 10 MG capsule Take 1 capsule (10 mg total) by mouth 3 (three) times daily as needed for spasms. 03/03/22   Harvest Dark, MD  gabapentin (NEURONTIN) 300 MG capsule Take 1 capsule (300 mg total) by mouth 3 (three) times daily as needed (Pain). 06/19/22 06/19/23  Nance Pear, MD  hydrOXYzine (ATARAX) 25 MG tablet Take 25 mg by mouth 3 (three) times daily as needed for anxiety.    [provider]  Meth-Hyo-M Bl-Na Phos-Ph Sal (URIBEL) 118 MG CAPS Take 1 capsule (118 mg total) by mouth every 6 (six) hours as needed. Patient taking differently: Take 1 capsule by mouth  every 6 (six) hours as needed (pain). 05/24/22   Zara Council A, PA-C  ondansetron (ZOFRAN) 4 MG tablet Take 1 tablet (4 mg total) by mouth every 4 (four) hours as needed for nausea or vomiting. 04/20/22   Jeanell Sparrow, DO      VITAL SIGNS:  Blood pressure (!) 161/84, pulse 91, temperature (!) 97.4 F (36.3 C), temperature source Oral, resp. rate (!) 21, height 5\' 4"  (1.626 m), weight 68 kg, SpO2 99 %.  PHYSICAL EXAMINATION:  Physical Exam  GENERAL:  62 y.o.-year-old patient lying in the bed with no acute distress.  EYES: Pupils equal, round, reactive to light and accommodation. No scleral icterus. Extraocular muscles intact.  HEENT: Head  atraumatic, normocephalic. Oropharynx and nasopharynx clear.  NECK:  Supple, no jugular venous distention. No thyroid enlargement, no tenderness.  LUNGS: Normal breath sounds bilaterally, no wheezing, rales,rhonchi or crepitation. No use of accessory muscles of respiration.  CARDIOVASCULAR: Regular rate and rhythm, S1, S2 normal. No murmurs, rubs, or gallops.  ABDOMEN: Soft, nondistended, nontender. Bowel sounds present. No organomegaly or mass.  EXTREMITIES: No pedal edema, cyanosis, or clubbing.  NEUROLOGIC: Cranial nerves II through XII are intact. Muscle strength 5/5 in all extremities. Sensation intact. Gait not checked.  PSYCHIATRIC: The patient is alert and oriented x 3.  Normal affect and good eye contact. SKIN: No obvious rash, lesion, or ulcer.   LABORATORY PANEL:   CBC Recent Labs  Lab 07/01/22 1859  WBC 17.6*  HGB 11.9*  HCT 32.7*  PLT 271   ------------------------------------------------------------------------------------------------------------------  Chemistries  Recent Labs  Lab 07/01/22 1859  NA 104*  K 2.2*  CL 66*  CO2 26  GLUCOSE 136*  BUN 8  CREATININE 0.50  CALCIUM 7.1*  MG 1.4*  AST 42*  ALT 53*  ALKPHOS 54  BILITOT 0.9    ------------------------------------------------------------------------------------------------------------------  Cardiac Enzymes No results for input(s): "TROPONINI" in the last 168 hours. ------------------------------------------------------------------------------------------------------------------  RADIOLOGY:  CT HEAD WO CONTRAST ( )  Result Date: 07/01/2022 CLINICAL DATA:  Recent syncopal episode and headaches, initial encounter EXAM: CT HEAD WITHOUT CONTRAST TECHNIQUE: Contiguous axial images were obtained from the base of the skull through the vertex without intravenous contrast. RADIATION DOSE REDUCTION: This exam was performed according to the departmental dose-optimization program which includes automated exposure control, adjustment of the mA and/or kV according to patient size and/or use of iterative reconstruction technique. COMPARISON:  06/21/2022 FINDINGS: Brain: Examination is somewhat limited by patient motion artifact. No findings to suggest acute hemorrhage, acute infarction or space-occupying mass lesion are noted. Vascular: No hyperdense vessel or unexpected calcification. Skull: Normal. Negative for fracture or focal lesion. Sinuses/Orbits: No acute finding. Other: None. IMPRESSION: Somewhat limited exam due to patient motion artifact. No acute abnormality is noted. Electronically Signed   By: Alcide Clever M.D.   On: 07/01/2022 20:50   CT ABDOMEN PELVIS W CONTRAST  Result Date: 07/01/2022 CLINICAL DATA:  Acute abdominal pain and vomiting, initial encounter EXAM: CT ABDOMEN AND PELVIS WITH CONTRAST TECHNIQUE: Multidetector CT imaging of the abdomen and pelvis was performed using the standard protocol following bolus administration of intravenous contrast. RADIATION DOSE REDUCTION: This exam was performed according to the departmental dose-optimization program which includes automated exposure control, adjustment of the mA and/or kV according to patient size and/or use of  iterative reconstruction technique. CONTRAST:  OMNIPAQUE IOHEXOL 300 MG/ML  SOLN COMPARISON:  06/19/2022 FINDINGS: Lower chest: Lung bases demonstrate diffuse emphysematous changes. Small pericardial effusion is noted new from the prior exam. Fluid is noted in the distal esophagus likely related to combination of hiatal hernia and reflux. Hepatobiliary: No focal liver abnormality is seen. Status post cholecystectomy. No biliary dilatation. Pancreas: Unremarkable. No pancreatic ductal dilatation or surrounding inflammatory changes. Spleen: Normal in size without focal abnormality. Adrenals/Urinary Tract: Adrenal glands are within normal limits. Kidneys demonstrate a normal enhancement pattern bilaterally. Mild fullness of the collecting systems and ureters is seen bilaterally likely related to a distended bladder as no mass or stone is seen. The bladder is well distended. A Foley catheter is noted in place however. Stomach/Bowel: No obstructive or inflammatory changes of colon are seen. Appendix is within normal limits. Small bowel is within normal limits. Stomach is  distended with fluid. Vascular/Lymphatic: Aortic atherosclerosis. No enlarged abdominal or pelvic lymph nodes. Reproductive: Status post hysterectomy. No adnexal masses. Other: No abdominal wall hernia or abnormality. No abdominopelvic ascites. Musculoskeletal: Degenerative changes of lumbar spine are noted. Mild anterolisthesis of L4 on L5 is noted. IMPRESSION: Distention of the bladder despite a Foley catheter in place. This results in fullness of the collecting systems as well. New small pericardial effusion. Small hiatal hernia and reflux. Electronically Signed   By: Alcide Clever M.D.   On: 07/01/2022 20:47   DG Chest Portable 1 View  Result Date: 07/01/2022 CLINICAL DATA:  Vomiting and syncopal episode, initial encounter EXAM: PORTABLE CHEST 1 VIEW COMPARISON:  06/21/2022 FINDINGS: The heart size and mediastinal contours are within normal  limits. Both lungs are clear. The visualized skeletal structures are unremarkable. IMPRESSION: No active disease. Electronically Signed   By: Alcide Clever M.D.   On: 07/01/2022 19:06      IMPRESSION AND PLAN:  Assessment and Plan: No notes have been filed under this hospital service. Service: Hospitalist      DVT prophylaxis: Lovenox***  Advanced Care Planning:  Code Status: full code***  Family Communication:  The plan of care was discussed in details with the patient (and family). I answered all questions. The patient agreed to proceed with the above mentioned plan. Further management will depend upon hospital course. Disposition Plan: Back to previous home environment Consults called: none***  All the records are reviewed and case discussed with ED provider.  Status is: Inpatient {Inpatient:23812}   At the time of the admission, it appears that the appropriate admission status for this patient is inpatient.  This is judged to be reasonable and necessary in order to provide the required intensity of service to ensure the patient's safety given the presenting symptoms, physical exam findings and initial radiographic and laboratory data in the context of comorbid conditions.  The patient requires inpatient status due to high intensity of service, high risk of further deterioration and high frequency of surveillance required.  I certify that at the time of admission, it is my clinical judgment that the patient will require inpatient hospital care extending more than 2 midnights.                            Dispo: The patient is from: Home              Anticipated d/c is to: Home              Patient currently is not medically stable to d/c.              Difficult to place patient: No  Hannah Beat M.D on 07/01/2022 at 10:01 PM  [facility]   From 7 PM-7 AM, contact night-coverage [email or web address]  CC: Primary care physician; Ziglar, Eli Phillips, MD

## 2022-07-01 NOTE — ED Triage Notes (Signed)
Pt to ED via [facility]. Family states pt was sitting in chair and vomited on herself then went unresponsive for approximately 5 minutes. Pt given 4 mg Zofran by EMS.

## 2022-07-01 NOTE — ED Notes (Signed)
Date and time results received: 07/01/22 1937  Test: Potassium, Sodium Critical Value: K+ 2.2, Na+ 104  Name of Provider Notified: [place]  Awaiting orders

## 2022-07-01 NOTE — ED Provider Notes (Addendum)
[facility] Provider Note    Event Date/Time   First MD Initiated Contact with Patient 07/01/22 1847     (approximate)   History   No chief complaint on file.   HPI  Katelyn [name] is a 62 y.o. female with COPD, CHF, hypertension with recent Foley placement who comes in with concerns for nausea vomiting diarrhea and syncopal/unconscious episode.  On review of records patient was admitted to the hospital on 1/1.  It was thought that she was having altered mental status secondary to hyponatremia but she was also positive for benzos and barbiturates with concern for polypharmacy.   Patient reportedly has been having nausea vomiting diarrhea.  She had vomited on herself and was sitting at the table where she had unconscious episode where she was unresponsive for about 5 minutes.  She was able to come to without any interventions.  Noted report of her hitting her head.  She reports just having a queasy stomach and some stomach aches.  She denies any chest pain, shortness of breath.  Patient had received 4 of Zofran and 500 cc of fluid with EMS.  Physical Exam   Triage Vital Signs: Height 5\' 4"  (1.626 m), weight 68 kg, SpO2 99 %.  Most recent vital signs: Vitals:   07/01/22 1846  SpO2: 99%     General: Awake, no distress.  CV:  Good peripheral perfusion.  Resp:  Normal effort.  Abd:  No distention.  Foley catheter noted .  Some mild generalized tenderness. Other:  Equal strength in arms and legs.   ED Results / Procedures / Treatments   Labs (all labs ordered are listed, but only abnormal results are displayed) Labs Reviewed  RESP PANEL BY RT-PCR (RSV, FLU A&B, COVID)  [id number]  CULTURE, BLOOD (ROUTINE X 2)  CULTURE, BLOOD (ROUTINE X 2)  GASTROINTESTINAL PANEL BY PCR, STOOL (REPLACES STOOL CULTURE)  C DIFFICILE QUICK SCREEN W PCR REFLEX    CBC WITH DIFFERENTIAL/PLATELET  COMPREHENSIVE METABOLIC PANEL  LIPASE, BLOOD  URINE DRUG SCREEN,  QUALITATIVE ([facility] ONLY)  URINALYSIS, ROUTINE W REFLEX MICROSCOPIC  LACTIC ACID, PLASMA  LACTIC ACID, PLASMA  MAGNESIUM  TROPONIN I (HIGH SENSITIVITY)     EKG  My interpretation of EKG:  Normal sinus rhythm 91 without any ST elevation or T wave inversions however QTc is prolonged at 527  RADIOLOGY I have reviewed the xray personally and interpreted and no pneumonia   PROCEDURES:  Critical Care performed: Yes, see critical care procedure note(s)  .1-3 Lead EKG Interpretation  Performed by: Vanessa Casco, MD Authorized by: Vanessa Rand, MD     Interpretation: normal     ECG rate:  80   ECG rate assessment: normal     Rhythm: sinus rhythm     Ectopy: none     Conduction: normal   .Critical Care  Performed by: Vanessa [place], MD Authorized by: Vanessa [place], MD   Critical care provider statement:    Critical care time (minutes):  30   Critical care was necessary to treat or prevent imminent or life-threatening deterioration of the following conditions: electrolyte/hyponatremia.   Critical care was time spent personally by me on the following activities:  Development of treatment plan with patient or surrogate, discussions with consultants, evaluation of patient's response to treatment, examination of patient, ordering and review of laboratory studies, ordering and review of radiographic studies, ordering and performing treatments and interventions, pulse oximetry, re-evaluation of patient's condition and  review of old charts    MEDICATIONS ORDERED IN ED: Medications  potassium chloride 10 mEq in 100 mL IVPB (has no administration in time range)  potassium chloride SA (KLOR-CON M) CR tablet 40 mEq (has no administration in time range)  magnesium sulfate IVPB 2 g 50 mL (has no administration in time range)  sodium chloride 0.9 % bolus 500 mL (500 mLs Intravenous New Bag/Given 07/01/22 1933)  iohexol (OMNIPAQUE) 300 MG/ML solution 100 mL (100 mLs Intravenous Contrast Given  07/01/22 2024)     IMPRESSION / MDM / ASSESSMENT AND PLAN / ED COURSE  I reviewed the triage vital signs and the nursing notes.   Patient's presentation is most consistent with acute presentation with potential threat to life or bodily function.   Patient comes in with weakness, dehydration could be orthostatic secondary to nausea vomiting diarrhea.  Will check labs to evaluate for Electra abnormalities, chest x-ray to evaluate for any aspiration, EKG, troponin evaluate for any arrhythmia, ACS and CT imaging to evaluate for any intracranial hemorrhage, obstruction or other acute pathology.  Will give a additional little bit of fluid  Patient's white count is elevated but lactate is normal no fever.  Unclear source at this time so we will hold off on antibiotics.  COVID and flu are negative.  Her urinalysis is negative.  Her sodium is significantly low at 104 and a potassium of 2.2  Her CT scan did show concerns for continued retention of her bladder-it looks that her Foley has not been emptied I did discuss the nurse to replace the Foley just to ensure that it was flowing correctly.  Her EKG does show prolonged QTc most likely due to low magnesium and low potassium.  Will continue to give repletion's I discussed the case with Dr. Sidney Ace who will admit the patient.  We discussed hypertonic saline but at this time she is not confusion is alert and oriented x 3 moving all extremities well she did not have a seizure it was most likely a vasovagal syncope related to the vomiting given she just vomited prior to syncopized a and she is in no cardiorespiratory distress therefore per the [place] protocols she does not meet criteria for hypertonic saline.  The patient is on the cardiac monitor to evaluate for evidence of arrhythmia and/or significant heart rate changes.      FINAL CLINICAL IMPRESSION(S) / ED DIAGNOSES   Final diagnoses:  Hyponatremia  Hypomagnesemia  Hypokalemia  Urinary retention      Rx / DC Orders   ED Discharge Orders     None        Note:  This document was prepared using Dragon voice recognition software and may include unintentional dictation errors.   Vanessa Stephens, MD 07/01/22 2142    Vanessa Slovan, MD 07/01/22 [phone]

## 2022-07-02 ENCOUNTER — Inpatient Hospital Stay: Payer: Medicaid Other

## 2022-07-02 DIAGNOSIS — E785 Hyperlipidemia, unspecified: Secondary | ICD-10-CM | POA: Insufficient documentation

## 2022-07-02 DIAGNOSIS — K219 Gastro-esophageal reflux disease without esophagitis: Secondary | ICD-10-CM | POA: Insufficient documentation

## 2022-07-02 DIAGNOSIS — F32A Depression, unspecified: Secondary | ICD-10-CM

## 2022-07-02 DIAGNOSIS — F419 Anxiety disorder, unspecified: Secondary | ICD-10-CM

## 2022-07-02 DIAGNOSIS — K529 Noninfective gastroenteritis and colitis, unspecified: Secondary | ICD-10-CM | POA: Diagnosis not present

## 2022-07-02 DIAGNOSIS — E876 Hypokalemia: Secondary | ICD-10-CM | POA: Diagnosis not present

## 2022-07-02 DIAGNOSIS — E871 Hypo-osmolality and hyponatremia: Secondary | ICD-10-CM | POA: Diagnosis not present

## 2022-07-02 DIAGNOSIS — R112 Nausea with vomiting, unspecified: Secondary | ICD-10-CM | POA: Insufficient documentation

## 2022-07-02 DIAGNOSIS — I959 Hypotension, unspecified: Secondary | ICD-10-CM

## 2022-07-02 DIAGNOSIS — G9340 Encephalopathy, unspecified: Secondary | ICD-10-CM

## 2022-07-02 DIAGNOSIS — J432 Centrilobular emphysema: Secondary | ICD-10-CM

## 2022-07-02 DIAGNOSIS — R197 Diarrhea, unspecified: Secondary | ICD-10-CM

## 2022-07-02 HISTORY — DX: Hypotension, unspecified: I95.9

## 2022-07-02 LAB — BASIC METABOLIC PANEL
Anion gap: 8 (ref 5–15)
Anion gap: 9 (ref 5–15)
Anion gap: 6 (ref 5–15)
BUN: 6 mg/dL — ABNORMAL LOW (ref 8–23)
BUN: <5 mg/dL — ABNORMAL LOW (ref 8–23)
BUN: <5 mg/dL — ABNORMAL LOW (ref 8–23)
CO2: 22 mmol/L (ref 22–32)
CO2: 27 mmol/L (ref 22–32)
CO2: 21 mmol/L — ABNORMAL LOW (ref 22–32)
Calcium: 5.6 mg/dL — CL (ref 8.9–10.3)
Calcium: 6.9 mg/dL — ABNORMAL LOW (ref 8.9–10.3)
Calcium: 6.6 mg/dL — ABNORMAL LOW (ref 8.9–10.3)
Chloride: 84 mmol/L — ABNORMAL LOW (ref 98–111)
Chloride: 76 mmol/L — ABNORMAL LOW (ref 98–111)
Chloride: 87 mmol/L — ABNORMAL LOW (ref 98–111)
Creatinine, Ser: 0.38 mg/dL — ABNORMAL LOW (ref 0.44–1.00)
Creatinine, Ser: 0.52 mg/dL (ref 0.44–1.00)
Creatinine, Ser: 0.46 mg/dL (ref 0.44–1.00)
GFR, Estimated: >60 mL/min (ref >60)
GFR, Estimated: >60 mL/min (ref >60)
GFR, Estimated: >60 mL/min (ref >60)
Glucose, Bld: 81 mg/dL (ref 70–99)
Glucose, Bld: 103 mg/dL — ABNORMAL HIGH (ref 70–99)
Glucose, Bld: 99 mg/dL (ref 70–99)
Potassium: <2 mmol/L — CL (ref 3.5–5.1)
Potassium: 2.7 mmol/L — CL (ref 3.5–5.1)
Potassium: 4.7 mmol/L (ref 3.5–5.1)
Sodium: 114 mmol/L — CL (ref 135–145)
Sodium: 112 mmol/L — CL (ref 135–145)
Sodium: 114 mmol/L — CL (ref 135–145)

## 2022-07-02 LAB — RENAL FUNCTION PANEL
Albumin: 1.9 g/dL — ABNORMAL LOW (ref 3.5–5.0)
Albumin: 2.7 g/dL — ABNORMAL LOW (ref 3.5–5.0)
Anion gap: 3 — ABNORMAL LOW (ref 5–15)
Anion gap: 6 (ref 5–15)
BUN: <5 mg/dL — ABNORMAL LOW (ref 8–23)
BUN: <5 mg/dL — ABNORMAL LOW (ref 8–23)
CO2: 20 mmol/L — ABNORMAL LOW (ref 22–32)
CO2: 24 mmol/L (ref 22–32)
Calcium: 5.5 mg/dL — CL (ref 8.9–10.3)
Calcium: 7.7 mg/dL — ABNORMAL LOW (ref 8.9–10.3)
Chloride: 84 mmol/L — ABNORMAL LOW (ref 98–111)
Chloride: 98 mmol/L (ref 98–111)
Creatinine, Ser: 0.47 mg/dL (ref 0.44–1.00)
Creatinine, Ser: 0.54 mg/dL (ref 0.44–1.00)
GFR, Estimated: >60 mL/min (ref >60)
GFR, Estimated: >60 mL/min (ref >60)
Glucose, Bld: 85 mg/dL (ref 70–99)
Glucose, Bld: 96 mg/dL (ref 70–99)
Phosphorus: 1.5 mg/dL — ABNORMAL LOW (ref 2.5–4.6)
Phosphorus: 2.9 mg/dL (ref 2.5–4.6)
Potassium: 4.2 mmol/L (ref 3.5–5.1)
Potassium: 5 mmol/L (ref 3.5–5.1)
Sodium: 107 mmol/L — CL (ref 135–145)
Sodium: 128 mmol/L — ABNORMAL LOW (ref 135–145)

## 2022-07-02 LAB — BLOOD GAS, VENOUS
Acid-Base Excess: 2.5 mmol/L — ABNORMAL HIGH (ref 0.0–2.0)
Bicarbonate: 28.4 mmol/L — ABNORMAL HIGH (ref 20.0–28.0)
O2 Saturation: 39.7 %
Patient temperature: 37
pCO2, Ven: 48 mmHg (ref 44–60)
pH, Ven: 7.38 (ref 7.25–7.43)
pO2, Ven: 32 mmHg (ref 32–45)

## 2022-07-02 LAB — CBC
HCT: 30.1 % — ABNORMAL LOW (ref 36.0–46.0)
Hemoglobin: 11 g/dL — ABNORMAL LOW (ref 12.0–15.0)
MCH: 30.3 pg (ref 26.0–34.0)
MCHC: 36.5 g/dL — ABNORMAL HIGH (ref 30.0–36.0)
MCV: 82.9 fL (ref 80.0–100.0)
Platelets: 223 10*3/uL (ref 150–400)
RBC: 3.63 MIL/uL — ABNORMAL LOW (ref 3.87–5.11)
RDW: 12.2 % (ref 11.5–15.5)
WBC: 11.1 10*3/uL — ABNORMAL HIGH (ref 4.0–10.5)
nRBC: 0 % (ref 0.0–0.2)

## 2022-07-02 LAB — TSH
TSH: 0.666 u[IU]/mL (ref 0.350–4.500)
TSH: 0.563 u[IU]/mL (ref 0.350–4.500)

## 2022-07-02 LAB — URINALYSIS, ROUTINE W REFLEX MICROSCOPIC
Bilirubin Urine: NEGATIVE
Glucose, UA: NEGATIVE mg/dL
Ketones, ur: 5 mg/dL — AB
Nitrite: NEGATIVE
Protein, ur: NEGATIVE mg/dL
Specific Gravity, Urine: 1.005 (ref 1.005–1.030)
pH: 7 (ref 5.0–8.0)

## 2022-07-02 LAB — NA AND K (SODIUM & POTASSIUM), RAND UR
Potassium Urine: 11 mmol/L
Sodium, Ur: <10 mmol/L

## 2022-07-02 LAB — COMPREHENSIVE METABOLIC PANEL
ALT: 37 U/L (ref 0–44)
AST: 25 U/L (ref 15–41)
Albumin: 2.3 g/dL — ABNORMAL LOW (ref 3.5–5.0)
Alkaline Phosphatase: 44 U/L (ref 38–126)
Anion gap: 2 — ABNORMAL LOW (ref 5–15)
BUN: <5 mg/dL — ABNORMAL LOW (ref 8–23)
CO2: 24 mmol/L (ref 22–32)
Calcium: 6.8 mg/dL — ABNORMAL LOW (ref 8.9–10.3)
Chloride: 89 mmol/L — ABNORMAL LOW (ref 98–111)
Creatinine, Ser: 0.53 mg/dL (ref 0.44–1.00)
GFR, Estimated: >60 mL/min (ref >60)
Glucose, Bld: 113 mg/dL — ABNORMAL HIGH (ref 70–99)
Potassium: 6 mmol/L — ABNORMAL HIGH (ref 3.5–5.1)
Sodium: 115 mmol/L — CL (ref 135–145)
Total Bilirubin: 0.6 mg/dL (ref 0.3–1.2)
Total Protein: 4.6 g/dL — ABNORMAL LOW (ref 6.5–8.1)

## 2022-07-02 LAB — OSMOLALITY, URINE: Osmolality, Ur: 214 mOsm/kg — ABNORMAL LOW (ref 300–900)

## 2022-07-02 LAB — CBG MONITORING, ED: Glucose-Capillary: 104 mg/dL — ABNORMAL HIGH (ref 70–99)

## 2022-07-02 LAB — GLUCOSE, CAPILLARY: Glucose-Capillary: 117 mg/dL — ABNORMAL HIGH (ref 70–99)

## 2022-07-02 LAB — PHOSPHORUS: Phosphorus: 1.8 mg/dL — ABNORMAL LOW (ref 2.5–4.6)

## 2022-07-02 LAB — CORTISOL: Cortisol, Plasma: 9.3 ug/dL

## 2022-07-02 LAB — MAGNESIUM: Magnesium: 3.1 mg/dL — ABNORMAL HIGH (ref 1.7–2.4)

## 2022-07-02 LAB — ALBUMIN: Albumin: 2.6 g/dL — ABNORMAL LOW (ref 3.5–5.0)

## 2022-07-02 LAB — AMMONIA: Ammonia: 18 umol/L (ref 9–35)

## 2022-07-02 LAB — MRSA NEXT GEN BY PCR, NASAL: MRSA by PCR Next Gen: NOT DETECTED

## 2022-07-02 LAB — OSMOLALITY: Osmolality: 242 mOsm/kg — CL (ref 275–295)

## 2022-07-02 MED ORDER — LABETALOL HCL 5 MG/ML IV SOLN: 20.0000 mg | INTRAVENOUS | Status: DC | PRN

## 2022-07-02 MED ORDER — MAGNESIUM SULFATE 2 GM/50ML IV SOLN
2.0000 g | Freq: Once | INTRAVENOUS | Status: AC
  Administered 2022-07-02: 2 g via INTRAVENOUS
  Filled 2022-07-02: qty 50

## 2022-07-02 MED ORDER — MIDODRINE HCL 5 MG PO TABS
5.0000 mg | ORAL_TABLET | Freq: Three times a day (TID) | ORAL | Status: DC
  Filled 2022-07-02: qty 1

## 2022-07-02 MED ORDER — POTASSIUM CHLORIDE 10 MEQ/100ML IV SOLN
10.0000 meq | INTRAVENOUS | Status: DC
  Administered 2022-07-02 (×4): 10 meq via INTRAVENOUS
  Filled 2022-07-02 (×3): qty 100

## 2022-07-02 MED ORDER — VANCOMYCIN HCL 1750 MG/350ML IV SOLN: 1750.0000 mg | INTRAVENOUS | Status: DC

## 2022-07-02 MED ORDER — NOREPINEPHRINE 4 MG/250ML-% IV SOLN
INTRAVENOUS | Status: AC
  Administered 2022-07-02: 10 ug/min via INTRAVENOUS
  Filled 2022-07-02: qty 250

## 2022-07-02 MED ORDER — CHLORHEXIDINE GLUCONATE CLOTH 2 % EX PADS
6.0000 | MEDICATED_PAD | Freq: Every day | CUTANEOUS | Status: DC
  Administered 2022-07-02 – 2022-07-05 (×4): 6 via TOPICAL

## 2022-07-02 MED ORDER — SODIUM CHLORIDE 0.9 % IV BOLUS: 1000.0000 mL | Freq: Once | INTRAVENOUS | Status: DC

## 2022-07-02 MED ORDER — SODIUM CHLORIDE 0.45 % IV BOLUS
1000.0000 mL | Freq: Once | INTRAVENOUS | Status: AC
  Administered 2022-07-02: 1000 mL via INTRAVENOUS

## 2022-07-02 MED ORDER — VANCOMYCIN HCL 1500 MG/300ML IV SOLN
1500.0000 mg | Freq: Once | INTRAVENOUS | Status: AC
  Administered 2022-07-02: 1500 mg via INTRAVENOUS
  Filled 2022-07-02: qty 300

## 2022-07-02 MED ORDER — SODIUM PHOSPHATES 45 MMOLE/15ML IV SOLN
30.0000 mmol | Freq: Once | INTRAVENOUS | Status: AC
  Administered 2022-07-02: 30 mmol via INTRAVENOUS
  Filled 2022-07-02: qty 10

## 2022-07-02 MED ORDER — POTASSIUM CHLORIDE IN NACL 40-0.9 MEQ/L-% IV SOLN
INTRAVENOUS | Status: DC
  Filled 2022-07-02: qty 1000

## 2022-07-02 MED ORDER — CALCIUM CARBONATE 1250 (500 CA) MG PO TABS
1.0000 | ORAL_TABLET | Freq: Three times a day (TID) | ORAL | Status: DC
  Administered 2022-07-02 (×2): 1250 mg via ORAL
  Filled 2022-07-02 (×3): qty 1

## 2022-07-02 MED ORDER — PIPERACILLIN-TAZOBACTAM 3.375 G IVPB
3.3750 g | Freq: Three times a day (TID) | INTRAVENOUS | Status: DC
  Administered 2022-07-02 – 2022-07-04 (×6): 3.375 g via INTRAVENOUS
  Filled 2022-07-02 (×7): qty 50

## 2022-07-02 MED ORDER — POTASSIUM CHLORIDE CRYS ER 20 MEQ PO TBCR
40.0000 meq | EXTENDED_RELEASE_TABLET | Freq: Once | ORAL | Status: AC
  Administered 2022-07-02: 40 meq via ORAL
  Filled 2022-07-02: qty 2

## 2022-07-02 MED ORDER — ALBUMIN HUMAN 25 % IV SOLN
12.5000 g | Freq: Once | INTRAVENOUS | Status: AC
  Administered 2022-07-02: 12.5 g via INTRAVENOUS
  Filled 2022-07-02: qty 50

## 2022-07-02 MED ORDER — POTASSIUM CHLORIDE 20 MEQ PO PACK
40.0000 meq | PACK | Freq: Once | ORAL | Status: AC
  Administered 2022-07-02: 40 meq via ORAL
  Filled 2022-07-02: qty 2

## 2022-07-02 MED ORDER — CALCIUM GLUCONATE-NACL 1-0.675 GM/50ML-% IV SOLN
1.0000 g | Freq: Once | INTRAVENOUS | Status: AC
  Administered 2022-07-02: 1000 mg via INTRAVENOUS
  Filled 2022-07-02: qty 50

## 2022-07-02 MED ORDER — NOREPINEPHRINE 4 MG/250ML-% IV SOLN: 0.0000 ug/min | INTRAVENOUS | Status: DC

## 2022-07-02 MED ORDER — SODIUM BICARBONATE 8.4 % IV SOLN
50.0000 meq | Freq: Once | INTRAVENOUS | Status: AC
  Administered 2022-07-02: 50 meq via INTRAVENOUS
  Filled 2022-07-02: qty 50

## 2022-07-02 MED ORDER — SODIUM CHLORIDE 0.9 % IV BOLUS
500.0000 mL | Freq: Once | INTRAVENOUS | Status: AC
  Administered 2022-07-02: 500 mL via INTRAVENOUS

## 2022-07-02 MED ORDER — SODIUM CHLORIDE 0.9 % IV SOLN: INTRAVENOUS | Status: DC

## 2022-07-02 MED ORDER — POTASSIUM CHLORIDE 10 MEQ/100ML IV SOLN
10.0000 meq | INTRAVENOUS | Status: AC
  Administered 2022-07-02 (×4): 10 meq via INTRAVENOUS
  Filled 2022-07-02 (×4): qty 100

## 2022-07-02 NOTE — ED Notes (Addendum)
Attending T. Arnell Asal notified of critical sodium 115 and of continued hypotension and lethargy via secure chat and page.

## 2022-07-02 NOTE — ED Notes (Signed)
Verbal hold on NS bolus currently per attending; awaiting blood results.

## 2022-07-02 NOTE — ED Notes (Addendum)
Pt currently lethargic, pupils pin-point, speech slurred, confused; dis to location; A&Ox3. Hypotensive. Attending T. Gonfa notified via secure chat. Will have Network engineer page attending.

## 2022-07-02 NOTE — ED Notes (Signed)
Float RN sending new tube for repeat potassium to confirm potassium of 6.

## 2022-07-02 NOTE — ED Notes (Signed)
Pt placed on 2L via [place].  

## 2022-07-02 NOTE — ED Notes (Signed)
Attending to bedside

## 2022-07-02 NOTE — ED Notes (Addendum)
Called lab to confirm they have received repeat cmp tube and are running it now. Staff confirms they received tube.

## 2022-07-02 NOTE — Assessment & Plan Note (Signed)
-  This is likely the culprit for her electrolyte abnormalities.

## 2022-07-02 NOTE — ED Notes (Signed)
Pt has yet to touch breakfast tray; prompted pt about it.

## 2022-07-02 NOTE — ED Notes (Signed)
Attending T. Gonfa returned page and order renal function labs.

## 2022-07-02 NOTE — ED Notes (Signed)
Randol Kern NP notified of critical lab results  Potassium 2.7 Sodium 112

## 2022-07-02 NOTE — Progress Notes (Signed)
PROGRESS NOTE  Katelyn [name]:[id number] DOB: 01-12-1961   PCP: Alease Medina, MD  Patient is from: Home.  DOA: 07/01/2022 LOS: 1  Chief complaints Generalized weakness  Brief Narrative / Interim history: 62 year old F with PMH of COPD, anxiety, depression, GERD, HTN, HLD, chronic pain, hyponatremia and tobacco use disorder presented to ED with nausea, vomiting, diarrhea and abdominal pain for about 5 days and admitted with significant electrolyte derangements. Na 104.  K2.2.  Mg 1.4. Cl 66.  WBC 17.6 with left shift.  EKG with QTc of 527.  CT abdomen and pelvis showed distended bladder despite Foley catheter with fullness of the collecting system.  CXR without acute finding.  Patient was started on IV NS, potassium and magnesium supplementation and admitted for severe hyponatremia and electrolyte derangement.  NA 115.  K 6.0. P 1.8.  Now with acute encephalopathy and hypotension.  Subjective: Patient was awake and alert when I saw her about 10 AM.  She will be hypotensive with acute mental status change about 10:30 AM.  Patient was very somnolent and able to follow some commands such as squeezing my hands and moving her legs. Na 115.  K6.0.  Cl 89.  Ammonia normal.  ABG normal.  CT head without acute finding.  Received 500 cc NS bolus.  She remained hypotensive.  EDP started Levophed.  On reevaluation about 1 PM, remains somnolent.  Blood pressure improved on Levophed.  Objective: Vitals:   07/02/22 1317 07/02/22 1320 07/02/22 1324 07/02/22 1325  BP: 130/70 132/75  (!) 146/87  Pulse:   64   Resp: 17 17  17   Temp:      TempSrc:      SpO2:   100%   Weight:      Height:        Examination:  GENERAL: No apparent distress.  Nontoxic. HEENT: MMM.  Vision and hearing grossly intact.  NECK: Supple.  No nuchal rigidity. RESP:  No IWOB.  Fair aeration bilaterally. CVS:  RRR. Heart sounds normal.  ABD/GI/GU: BS+. Abd soft, NTND.  MSK/EXT:  Moves extremities. No apparent  deformity. No edema.  SKIN: no apparent skin lesion or wound NEURO: Somnolent.  Opens eyes and moves the hand in response to noxious stimulus.  PERRL.  No facial asymmetry.  No apparent focal neuro deficit. PSYCH: Calm. Normal affect.   Procedures:  None  Microbiology summarized: COVID-19 and influenza PCR nonreactive. Blood cultures NGTD Urine culture pending C. difficile and GIP pending   Assessment and plan: Principal Problem:   Hyponatremia Active Problems:   Hypokalemia   Hypomagnesemia   Acute gastroenteritis   Essential hypertension   Acute encephalopathy   COPD (chronic obstructive pulmonary disease) (HCC)   Dyslipidemia   Anxiety and depression   GERD without esophagitis   Hypotension   Nausea vomiting and diarrhea  Severe chronic hyponatremia: Patient with chronic hyponatremia. Na 104 and corrected to 114 within 4 hours but dropped down to 112. Na 115 this morning.  -Continue NS at 100 cc an hour -IV 1/2-NS bolus mainly for hypertension.  Hypokalemia/hypomagnesemia/hypochloremia/phosphatemia: K2.2 on admission now up to 6.0. -Sodium bicarb amp 50 x 1. -Recheck and confirm hyperkalemia -Monitor replenish as appropriate.  Acute encephalopathy:  awake and alert when I saw her about 10 AM.  She became hypotensive with acute mental status change about 10:30 AM.  She was very somnolent and able to follow some commands such as squeezing my hands and moving her legs.  No focal neurodeficit.  More somnolent about 1 PM.  She received 1 mg Xanax and 100 mg Seroquel early in the morning.  Home medication bottles in the room.  CT head, ABG, ammonia unrevealing.  Doubt this is osmotic demyelinating syndrome since she was awake and alert earlier.  -UDS, B12, TSH -Fall, aspiration and delirium precaution -Discontinue sedating medications  Hypotension: Unclear etiology of this.  Did not receive antihypertensive meds this morning.  Did not respond to 500 cc NS bolus.  -1/2 NS 1  L x 1 -Started on Levophed by ED provider. -Check TTE, TSH, cortisol -Critical care consulted  Nausea/vomiting/diarrhea: Nausea seems to have been proved.  No further vomiting.  Still with some diarrhea.  Abdominal exam benign. -Follow C. difficile and GIP -IV fluid as above.  Anxiety/depression/chronic pain -Hold home sedating medications for now  Urinary retention? -Continue indwelling Foley catheter  Chronic COPD: Stable.  ABG reassuring. -Inhalers and nebulizers   Body mass index is 25.75 kg/m.           DVT prophylaxis:  enoxaparin (LOVENOX) injection 40 mg Start: 07/01/22 2200  Code Status: DNR/DNI Family Communication: None at bedside.  Only a friend listed in her chart Level of care: Stepdown Status is: Inpatient Remains inpatient appropriate because: Hypotension, acute encephalopathy, severe hyponatremia and other electrolyte derangements   Final disposition: TBD Consultants:  Pulmonology/critical care    Sch Meds:  Scheduled Meds:  amLODipine  10 mg Oral Daily   atorvastatin  40 mg Oral Daily   budesonide (PULMICORT) nebulizer solution  0.5 mg Nebulization BID   calcium carbonate  1 tablet Oral TID WC   enoxaparin (LOVENOX) injection  40 mg Subcutaneous Q24H   FLUoxetine  10 mg Oral Daily   metoprolol succinate  100 mg Oral Daily   pantoprazole  40 mg Oral Daily   polyethylene glycol  17 g Oral Daily   QUEtiapine  100 mg Oral BID   senna-docusate  2 tablet Oral BID   sodium chloride  1 g Oral BID WC   Vitamin D (Ergocalciferol)  50,000 Units Oral Weekly   Continuous Infusions:  sodium chloride 100 mL/hr at 07/02/22 1252   norepinephrine (LEVOPHED) Adult infusion 5 mcg/min (07/02/22 1326)   sodium chloride Stopped (07/02/22 1200)   PRN Meds:.acetaminophen **OR** acetaminophen, benzonatate, butalbital-acetaminophen-caffeine, dicyclomine, gabapentin, magnesium hydroxide, ondansetron **OR** ondansetron (ZOFRAN) IV,  traZODone  Antimicrobials: Anti-infectives (From admission, onward)    Start     Dose/Rate Route Frequency Ordered Stop   07/01/22 2156  Uribel 118 MG CAPS 118 mg  Status:  Discontinued        1 capsule Oral Every 6 hours PRN 07/01/22 2158 07/01/22 2201        I have personally reviewed the following labs and images: CBC: Recent Labs  Lab 07/01/22 1859 07/02/22 0438  WBC 17.6* 11.1*  NEUTROABS 13.8*  --   HGB 11.9* 11.0*  HCT 32.7* 30.1*  MCV 82.6 82.9  PLT 271 223   BMP &GFR Recent Labs  Lab 07/01/22 1859 07/01/22 2238 07/02/22 0139 07/02/22 0438 07/02/22 1116 07/02/22 1118  NA 104* 114*  --  112*  --  115*  K 2.2* <2.0*  --  2.7*  --  6.0*  CL 66* 84*  --  76*  --  89*  CO2 26 22  --  27  --  24  GLUCOSE 136* 81  --  103*  --  113*  BUN 8 6*  --  <5*  --  <5*  CREATININE 0.50 0.38*  --  0.52  --  0.53  CALCIUM 7.1* 5.6*  --  6.9*  --  6.8*  MG 1.4*  --  3.1*  --   --   --   PHOS  --   --   --   --  1.8*  --    Estimated Creatinine Clearance: 69.1 mL/min (by C-G formula based on SCr of 0.53 mg/dL). Liver & Pancreas: Recent Labs  Lab 07/01/22 1859 07/02/22 0139 07/02/22 1118  AST 42*  --  25  ALT 53*  --  37  ALKPHOS 54  --  44  BILITOT 0.9  --  0.6  PROT 6.0*  --  4.6*  ALBUMIN 3.1* 2.6* 2.3*   Recent Labs  Lab 07/01/22 1859  LIPASE 28   Recent Labs  Lab 07/02/22 1115  AMMONIA 18   Diabetic: No results for input(s): "HGBA1C" in the last 72 hours. No results for input(s): "GLUCAP" in the last 168 hours. Cardiac Enzymes: No results for input(s): "CKTOTAL", "CKMB", "CKMBINDEX", "TROPONINI" in the last 168 hours. No results for input(s): "PROBNP" in the last 8760 hours. Coagulation Profile: No results for input(s): "INR", "PROTIME" in the last 168 hours. Thyroid Function Tests: Recent Labs    07/01/22 1858  TSH 0.666   Lipid Profile: No results for input(s): "CHOL", "HDL", "LDLCALC", "TRIG", "CHOLHDL", "LDLDIRECT" in the last 72  hours. Anemia Panel: No results for input(s): "VITAMINB12", "FOLATE", "FERRITIN", "TIBC", "IRON", "RETICCTPCT" in the last 72 hours. Urine analysis:    Component Value Date/Time   COLORURINE COLORLESS (A) 07/01/2022 2354   APPEARANCEUR CLEAR (A) 07/01/2022 2354   APPEARANCEUR Clear 05/24/2022 1536   LABSPEC 1.005 07/01/2022 2354   PHURINE 7.0 07/01/2022 2354   GLUCOSEU NEGATIVE 07/01/2022 2354   HGBUR SMALL (A) 07/01/2022 2354   BILIRUBINUR NEGATIVE 07/01/2022 2354   BILIRUBINUR Negative 05/24/2022 1536   KETONESUR 5 (A) 07/01/2022 2354   PROTEINUR NEGATIVE 07/01/2022 2354   UROBILINOGEN 0.2 11/13/2007 2030   NITRITE NEGATIVE 07/01/2022 2354   LEUKOCYTESUR TRACE (A) 07/01/2022 2354   Sepsis Labs: Invalid input(s): "PROCALCITONIN", "LACTICIDVEN"  Microbiology: Recent Results (from the past 240 hour(s))  Respiratory (~20 pathogens) panel by PCR     Status: None   Collection Time: 06/22/22  3:36 PM   Specimen: Nasopharyngeal Swab; Respiratory  Result Value Ref Range Status   Adenovirus NOT DETECTED NOT DETECTED Final   Coronavirus 229E NOT DETECTED NOT DETECTED Final    Comment: (NOTE) The Coronavirus on the Respiratory Panel, DOES NOT test for the novel  Coronavirus (2019 nCoV)    Coronavirus HKU1 NOT DETECTED NOT DETECTED Final   Coronavirus NL63 NOT DETECTED NOT DETECTED Final   Coronavirus OC43 NOT DETECTED NOT DETECTED Final   Metapneumovirus NOT DETECTED NOT DETECTED Final   Rhinovirus / Enterovirus NOT DETECTED NOT DETECTED Final   Influenza A NOT DETECTED NOT DETECTED Final   Influenza B NOT DETECTED NOT DETECTED Final   Parainfluenza Virus 1 NOT DETECTED NOT DETECTED Final   Parainfluenza Virus 2 NOT DETECTED NOT DETECTED Final   Parainfluenza Virus 3 NOT DETECTED NOT DETECTED Final   Parainfluenza Virus 4 NOT DETECTED NOT DETECTED Final   Respiratory Syncytial Virus NOT DETECTED NOT DETECTED Final   Bordetella pertussis NOT DETECTED NOT DETECTED Final    Bordetella Parapertussis NOT DETECTED NOT DETECTED Final   Chlamydophila pneumoniae NOT DETECTED NOT DETECTED Final   Mycoplasma pneumoniae NOT DETECTED NOT DETECTED Final    Comment: Performed  at [facility], [address], [place], [place] [address]  Resp panel by RT-PCR (RSV, Flu A&B, Covid) Nasopharyngeal Swab     Status: None   Collection Time: 07/01/22  7:36 PM   Specimen: Nasopharyngeal Swab; Nasal Swab  Result Value Ref Range Status   SARS Coronavirus 2 by RT PCR NEGATIVE NEGATIVE Final    Comment: (NOTE) SARS-CoV-2 target nucleic acids are NOT DETECTED.  The SARS-CoV-2 RNA is generally detectable in upper respiratory specimens during the acute phase of infection. The lowest concentration of SARS-CoV-2 viral copies this assay can detect is 138 copies/mL. A negative result does not preclude SARS-Cov-2 infection and should not be used as the sole basis for treatment or other patient management decisions. A negative result may occur with  improper specimen collection/handling, submission of specimen other than nasopharyngeal swab, presence of viral mutation(s) within the areas targeted by this assay, and inadequate number of viral copies(<138 copies/mL). A negative result must be combined with clinical observations, patient history, and epidemiological information. The expected result is Negative.  Fact Sheet for Patients:  BloggerCourse.com  Fact Sheet for Healthcare Providers:  SeriousBroker.it  This test is no t yet approved or cleared by the [place] FDA and  has been authorized for detection and/or diagnosis of SARS-CoV-2 by FDA under an Emergency Use Authorization (EUA). This EUA will remain  in effect (meaning this test can be used) for the duration of the COVID-19 declaration under Section 564(b)(1) of the Act, 21 U.S.C.section 360bbb-3(b)(1), unless the authorization is terminated  or revoked sooner.        Influenza A by PCR NEGATIVE NEGATIVE Final   Influenza B by PCR NEGATIVE NEGATIVE Final    Comment: (NOTE) The Xpert Xpress SARS-CoV-2/FLU/RSV plus assay is intended as an aid in the diagnosis of influenza from Nasopharyngeal swab specimens and should not be used as a sole basis for treatment. Nasal washings and aspirates are unacceptable for Xpert Xpress SARS-CoV-2/FLU/RSV testing.  Fact Sheet for Patients: BloggerCourse.com  Fact Sheet for Healthcare Providers: SeriousBroker.it  This test is not yet approved or cleared by the [place] FDA and has been authorized for detection and/or diagnosis of SARS-CoV-2 by FDA under an Emergency Use Authorization (EUA). This EUA will remain in effect (meaning this test can be used) for the duration of the COVID-19 declaration under Section 564(b)(1) of the Act, 21 U.S.C. section 360bbb-3(b)(1), unless the authorization is terminated or revoked.     Resp Syncytial Virus by PCR NEGATIVE NEGATIVE Final    Comment: (NOTE) Fact Sheet for Patients: BloggerCourse.com  Fact Sheet for Healthcare Providers: SeriousBroker.it  This test is not yet approved or cleared by the [place] FDA and has been authorized for detection and/or diagnosis of SARS-CoV-2 by FDA under an Emergency Use Authorization (EUA). This EUA will remain in effect (meaning this test can be used) for the duration of the COVID-19 declaration under Section 564(b)(1) of the Act, 21 U.S.C. section 360bbb-3(b)(1), unless the authorization is terminated or revoked.  Performed at [facility], [address] [place], [place] [address]   Blood culture (routine x 2)     Status: None (Preliminary result)   Collection Time: 07/01/22  7:36 PM   Specimen: BLOOD  Result Value Ref Range Status   Specimen Description BLOOD BLOOD RIGHT ARM  Final   Special  Requests   Final    BOTTLES DRAWN AEROBIC AND ANAEROBIC Blood Culture adequate volume   Culture   Final  NO GROWTH < 12 HOURS Performed at [facility], [address] [place], [place] [address]    Report Status PENDING  Incomplete  Blood culture (routine x 2)     Status: None (Preliminary result)   Collection Time: 07/01/22  7:36 PM   Specimen: BLOOD  Result Value Ref Range Status   Specimen Description BLOOD BLOOD RIGHT HAND  Final   Special Requests   Final    BOTTLES DRAWN AEROBIC AND ANAEROBIC Blood Culture adequate volume   Culture   Final    NO GROWTH < 12 HOURS Performed at [facility], [address], [place], [place] [address]    Report Status PENDING  Incomplete    Radiology Studies: CT HEAD WO CONTRAST (5MM)  Result Date: 07/02/2022 CLINICAL DATA:  Mental status change, unknown cause EXAM: CT HEAD WITHOUT CONTRAST TECHNIQUE: Contiguous axial images were obtained from the base of the skull through the vertex without intravenous contrast. RADIATION DOSE REDUCTION: This exam was performed according to the departmental dose-optimization program which includes automated exposure control, adjustment of the mA and/or kV according to patient size and/or use of iterative reconstruction technique. COMPARISON:  07/01/2022. FINDINGS: Brain: No evidence of acute infarction, hemorrhage, hydrocephalus, extra-axial collection or mass lesion/mass effect. Vascular: No hyperdense vessel or unexpected calcification. Skull: Normal. Negative for fracture or focal lesion. Sinuses/Orbits: No acute finding. IMPRESSION: No acute intracranial process. Electronically Signed   By: Sammie Bench M.D.   On: 07/02/2022 11:51   CT HEAD WO CONTRAST (5MM)  Result Date: 07/01/2022 CLINICAL DATA:  Recent syncopal episode and headaches, initial encounter EXAM: CT HEAD WITHOUT CONTRAST TECHNIQUE: Contiguous axial images were obtained from the base of the skull through the vertex without  intravenous contrast. RADIATION DOSE REDUCTION: This exam was performed according to the departmental dose-optimization program which includes automated exposure control, adjustment of the mA and/or kV according to patient size and/or use of iterative reconstruction technique. COMPARISON:  06/21/2022 FINDINGS: Brain: Examination is somewhat limited by patient motion artifact. No findings to suggest acute hemorrhage, acute infarction or space-occupying mass lesion are noted. Vascular: No hyperdense vessel or unexpected calcification. Skull: Normal. Negative for fracture or focal lesion. Sinuses/Orbits: No acute finding. Other: None. IMPRESSION: Somewhat limited exam due to patient motion artifact. No acute abnormality is noted. Electronically Signed   By: Inez Catalina M.D.   On: 07/01/2022 20:50   CT ABDOMEN PELVIS W CONTRAST  Result Date: 07/01/2022 CLINICAL DATA:  Acute abdominal pain and vomiting, initial encounter EXAM: CT ABDOMEN AND PELVIS WITH CONTRAST TECHNIQUE: Multidetector CT imaging of the abdomen and pelvis was performed using the standard protocol following bolus administration of intravenous contrast. RADIATION DOSE REDUCTION: This exam was performed according to the departmental dose-optimization program which includes automated exposure control, adjustment of the mA and/or kV according to patient size and/or use of iterative reconstruction technique. CONTRAST:  176mL OMNIPAQUE IOHEXOL 300 MG/ML  SOLN COMPARISON:  06/19/2022 FINDINGS: Lower chest: Lung bases demonstrate diffuse emphysematous changes. Small pericardial effusion is noted new from the prior exam. Fluid is noted in the distal esophagus likely related to combination of hiatal hernia and reflux. Hepatobiliary: No focal liver abnormality is seen. Status post cholecystectomy. No biliary dilatation. Pancreas: Unremarkable. No pancreatic ductal dilatation or surrounding inflammatory changes. Spleen: Normal in size without focal abnormality.  Adrenals/Urinary Tract: Adrenal glands are within normal limits. Kidneys demonstrate a normal enhancement pattern bilaterally. Mild fullness of the collecting systems and ureters is seen bilaterally likely related to  a distended bladder as no mass or stone is seen. The bladder is well distended. A Foley catheter is noted in place however. Stomach/Bowel: No obstructive or inflammatory changes of colon are seen. Appendix is within normal limits. Small bowel is within normal limits. Stomach is distended with fluid. Vascular/Lymphatic: Aortic atherosclerosis. No enlarged abdominal or pelvic lymph nodes. Reproductive: Status post hysterectomy. No adnexal masses. Other: No abdominal wall hernia or abnormality. No abdominopelvic ascites. Musculoskeletal: Degenerative changes of lumbar spine are noted. Mild anterolisthesis of L4 on L5 is noted. IMPRESSION: Distention of the bladder despite a Foley catheter in place. This results in fullness of the collecting systems as well. New small pericardial effusion. Small hiatal hernia and reflux. Electronically Signed   By: Inez Catalina M.D.   On: 07/01/2022 20:47   DG Chest Portable 1 View  Result Date: 07/01/2022 CLINICAL DATA:  Vomiting and syncopal episode, initial encounter EXAM: PORTABLE CHEST 1 VIEW COMPARISON:  06/21/2022 FINDINGS: The heart size and mediastinal contours are within normal limits. Both lungs are clear. The visualized skeletal structures are unremarkable. IMPRESSION: No active disease. Electronically Signed   By: Inez Catalina M.D.   On: 07/01/2022 19:06    CRITICAL CARE Performed by: Mercy Riding   Total critical care time: 65 minutes  Critical care time was exclusive of separately billable procedures and treating other patients.  Critical care was necessary to treat or prevent imminent or life-threatening deterioration.  Critical care was time spent personally by me on the following activities: development of treatment plan with patient and/or  surrogate as well as nursing, discussions with consultants, evaluation of patient's response to treatment, examination of patient, obtaining history from patient or surrogate, ordering and performing treatments and interventions, ordering and review of laboratory studies, ordering and review of radiographic studies, pulse oximetry and re-evaluation of patient's condition.  Tirso Laws T. [place]  If 7PM-7AM, please contact night-coverage [email or web address] 07/02/2022, 1:31 PM

## 2022-07-02 NOTE — ED Notes (Signed)
Norepi started with verbal okay from [place].

## 2022-07-02 NOTE — Assessment & Plan Note (Signed)
-  We will continue antihypertensives. 

## 2022-07-02 NOTE — Assessment & Plan Note (Signed)
-  We will continue Xanax and Prozac as well as Seroquel.

## 2022-07-02 NOTE — ED Provider Notes (Signed)
-----------------------------------------   12:50 PM on 07/02/2022 -----------------------------------------   I was asked by the RN to evaluate this patient due to acute deterioration.  The patient remains hypotensive with an MAP of 61 and is lethargic.  Orders have been placed by the hospitalist for a 500 mL fluid bolus.  I otherwise for the patient to be started on Levophed.  VS are checked, I contacted Dr. Cyndia Skeeters the hospitalist who is taking care of the patient and he acknowledged and agrees to reevaluate.   Arta Silence, MD 07/02/22 1444

## 2022-07-02 NOTE — ED Notes (Signed)
Pt used call bell, states that she has had bowel movement. Pt with moderate amount of liquid stool in bed. Bedding and gown changed and pt cleaned. Stool had soaked into bed and was not able to be collected for sample.

## 2022-07-02 NOTE — ED Notes (Addendum)
Date and time results received: 07/02/22 0012  Test: Sodium, Potassium, Calcium Critical Value: Na+ 114, K+ <2, Ca+ 5.6  Name of Provider Notified: Eugenie Norrie  Awaiting orders

## 2022-07-02 NOTE — ED Notes (Signed)
Pt had watery bowel movement, Pt cleaned and with new linens by this tech and Gwenette Greet, Therapist, sports. Pt is slow to respond verbally and dozes off during change. Pt states "my stomach is messed up".

## 2022-07-02 NOTE — ED Notes (Signed)
Will give midodrine once pt awake enough to safely swallow. Pt currently remains very lethargic. Will briefly respond to voice/touch commands but then eyes go back shut.

## 2022-07-02 NOTE — ED Notes (Signed)
Notified attending T. Gonfa of critical serum osmolality level 242 now.

## 2022-07-02 NOTE — ED Notes (Signed)
Called RT to notify [facility] to lab now on ice.

## 2022-07-02 NOTE — ED Notes (Signed)
Intensivist provider at bedside

## 2022-07-02 NOTE — Consult Note (Addendum)
Pharmacy Antibiotic Note  Katelyn [name] is a 62 y.o. female admitted on 07/01/2022 with sepsis.  Pharmacy has been consulted for vancomycin and zosyn dosing.  Plan: Zosyn 3.375 IV (over 4 hours) every 8 hours Vancomycin 1500 mg IV x 1 followed by vancomycin 1750 mg IV every 24 hours Goal AUC 400-550  Est AUC: 503.9 Est Cmax: 43.7 Est Cmin: 9.2 Calculated with SCr 0.8 mg/dL    Height: 5\' 4"  (162.6 cm) Weight: 68 kg (150 lb) IBW/kg (Calculated) : 54.7  Temp (24hrs), Avg:97.6 F (36.4 C), Min:97.4 F (36.3 C), Max:97.8 F (36.6 C)  Recent Labs  Lab 07/01/22 1859 07/01/22 1936 07/01/22 2238 07/02/22 0438 07/02/22 1118 07/02/22 1317 07/02/22 1413  WBC 17.6*  --   --  11.1*  --   --   --   CREATININE 0.50  --  0.38* 0.52 0.53 0.47 0.46  LATICACIDVEN  --  1.5 1.2  --   --   --   --     Estimated Creatinine Clearance: 69.1 mL/min (by C-G formula based on SCr of 0.46 mg/dL).    Allergies  Allergen Reactions   Ciprofloxacin Other (See Comments)    Headache   Macrobid [Nitrofurantoin Monohyd Macro] Itching   Sulfa Antibiotics Itching    Antimicrobials this admission: 1/13 vancomycin >>  1/13 zosyn >>   Microbiology results: 1/12 BCx: sent 1/12 UCx: sent  1/12 MRSA PCR: sent 1/12 GI panel + C diff panel: sent  Thank you for allowing pharmacy to be a part of this patient's care.  Darrick Penna 07/02/2022 3:43 PM

## 2022-07-02 NOTE — Assessment & Plan Note (Signed)
-  We will continue statin therapy. 

## 2022-07-02 NOTE — ED Notes (Signed)
Pt has cup full of orange fluid that appears to be potassium mix that she keeps refusing to drink; pt prompted to attempt to drink it again. Pt took one sip then refused. Pt educated further. HOB adjusted for pt. Fluids restarted at reduced rate as pt could not tolerate potassium bag running without fluid.

## 2022-07-02 NOTE — ED Notes (Signed)
Pt had loose watery BM. Pt cleaned by this RN and Evelena Peat, Therapist, sports. Clean brief and pad in place.

## 2022-07-02 NOTE — Assessment & Plan Note (Signed)
-  Aggressive potassium replacement and will follow-up.

## 2022-07-02 NOTE — Consult Note (Signed)
PHARMACY CONSULT NOTE - FOLLOW UP  Pharmacy Consult for Electrolyte Monitoring and Replacement   Recent Labs: Potassium (mmol/L)  Date Value  07/02/2022 4.7  10/09/2012 4.1   Magnesium (mg/dL)  Date Value  07/02/2022 3.1 (H)   Calcium (mg/dL)  Date Value  07/02/2022 6.6 (L)   Calcium, Total (mg/dL)  Date Value  10/09/2012 8.9   Albumin (g/dL)  Date Value  07/02/2022 1.9 (L)   Phosphorus (mg/dL)  Date Value  07/02/2022 1.5 (L)   Sodium (mmol/L)  Date Value  07/02/2022 114 (LL)  10/09/2012 132 (L)   Corrected Ca: 8.2  Assessment: 62 year old female with a history of anxiety and depression, essential hypertension, GERD, dyslipidemia COPD with active tobacco abuse who came into the ED with altered mental status and diarrhea. Found to be lethargic and with severe hyponatremia with a sodium of 104. Pharmacy has been consulted to monitor and replace electrolytes as needed.   Pertinent medications: IVF: NS @ 100 mL/hr PO NaCl 1 gram tabs: 1 gram BID  Goal of Therapy:  Electrolytes WNL  Plan:  Sodium: Na 104 >>114>>107: continues on NS @ 100 mL/hr; correction per MD, nephrology consulted.  Calcium: 6.6 corrected Ca 8.2: Will order 1 gram IV calcium gluconate x 1, patient too lethargic to take PO replacement as documented by RN Phosphorous: 1.5: continue with NaPhos 30 mmol IV x 1 ordered by provider Potassium: >2.0 >> 4.7: s/p replacement by MD Q6H renal function panel ordered my provider Follow up with AM labs   Dorothe Pea, PharmD, BCPS Clinical Pharmacist   07/02/2022 3:29 PM

## 2022-07-02 NOTE — Consult Note (Addendum)
CRITICAL CARE     Name: Katelyn [name] MRN: [id number] DOB: [birth year]     LOS: 1   SUBJECTIVE FINDINGS & SIGNIFICANT EVENTS    History of presenting illness:  This is a 62 year old female with a history of anxiety and depression, essential hypertension, GERD, dyslipidemia COPD with active tobacco abuse who came into the ED with altered mental status and diarrhea.  Was found to be lethargic and with severe hyponatremia with a sodium of 104.  Was also hypertensive on admission, also noted to have transaminitis with a low albumin leukocytosis.  Patient had a negative drug screen on admission and negative for COVID RSV and influenza.  Lines/tubes : Urethral Catheter Katheran James RN Double-lumen 16 Fr. (Active)  Indication for Insertion or Continuance of Catheter Acute urinary retention (I&O Cath for 24 hrs prior to catheter insertion- Inpatient Only) 07/01/22 2352  Site Assessment Clean, Dry, Intact 07/01/22 2352  Catheter Maintenance Bag below level of bladder;Catheter secured;Drainage bag/tubing not touching floor;Insertion date on drainage bag;No dependent loops;Seal intact 07/01/22 2352  Collection Container Standard drainage bag 07/01/22 2352  Securement Method Securing device (Describe) 07/01/22 2352    Microbiology/Sepsis markers: Results for orders placed or performed during the hospital encounter of 07/01/22  Resp panel by RT-PCR (RSV, Flu A&B, Covid) Nasopharyngeal Swab     Status: None   Collection Time: 07/01/22  7:36 PM   Specimen: Nasopharyngeal Swab; Nasal Swab  Result Value Ref Range Status   SARS Coronavirus 2 by RT PCR NEGATIVE NEGATIVE Final    Comment: (NOTE) SARS-CoV-2 target nucleic acids are NOT DETECTED.  The SARS-CoV-2 RNA is generally detectable in upper respiratory specimens  during the acute phase of infection. The lowest concentration of SARS-CoV-2 viral copies this assay can detect is 138 copies/mL. A negative result does not preclude SARS-Cov-2 infection and should not be used as the sole basis for treatment or other patient management decisions. A negative result may occur with  improper specimen collection/handling, submission of specimen other than nasopharyngeal swab, presence of viral mutation(s) within the areas targeted by this assay, and inadequate number of viral copies(<138 copies/mL). A negative result must be combined with clinical observations, patient history, and epidemiological information. The expected result is Negative.  Fact Sheet for Patients:  BloggerCourse.com  Fact Sheet for Healthcare Providers:  SeriousBroker.it  This test is no t yet approved or cleared by the [place] FDA and  has been authorized for detection and/or diagnosis of SARS-CoV-2 by FDA under an Emergency Use Authorization (EUA). This EUA will remain  in effect (meaning this test can be used) for the duration of the COVID-19 declaration under Section 564(b)(1) of the Act, 21 U.S.C.section 360bbb-3(b)(1), unless the authorization is terminated  or revoked sooner.       Influenza A by PCR NEGATIVE NEGATIVE Final   Influenza B by PCR NEGATIVE NEGATIVE Final    Comment: (NOTE) The Xpert Xpress SARS-CoV-2/FLU/RSV plus assay is intended as an aid in the diagnosis of influenza from Nasopharyngeal swab specimens and should not be used as a sole basis for treatment. Nasal washings and aspirates are unacceptable for Xpert Xpress SARS-CoV-2/FLU/RSV testing.  Fact Sheet for Patients: BloggerCourse.com  Fact Sheet for Healthcare Providers: SeriousBroker.it  This test is not yet approved or cleared by the [place] FDA and has been authorized for detection  and/or diagnosis of SARS-CoV-2 by FDA under an Emergency Use Authorization (EUA). This EUA will remain in effect (meaning this test can be used) for  the duration of the COVID-19 declaration under Section 564(b)(1) of the Act, 21 U.S.C. section 360bbb-3(b)(1), unless the authorization is terminated or revoked.     Resp Syncytial Virus by PCR NEGATIVE NEGATIVE Final    Comment: (NOTE) Fact Sheet for Patients: BloggerCourse.comhttps://www.fda.gov/media/152166/download  Fact Sheet for Healthcare Providers: SeriousBroker.ithttps://www.fda.gov/media/152162/download  This test is not yet approved or cleared by the [place] FDA and has been authorized for detection and/or diagnosis of SARS-CoV-2 by FDA under an Emergency Use Authorization (EUA). This EUA will remain in effect (meaning this test can be used) for the duration of the COVID-19 declaration under Section 564(b)(1) of the Act, 21 U.S.C. section 360bbb-3(b)(1), unless the authorization is terminated or revoked.  Performed at [facility], [address] [place], [place] [address]   Blood culture (routine x 2)     Status: None (Preliminary result)   Collection Time: 07/01/22  7:36 PM   Specimen: BLOOD  Result Value Ref Range Status   Specimen Description BLOOD BLOOD RIGHT ARM  Final   Special Requests   Final    BOTTLES DRAWN AEROBIC AND ANAEROBIC Blood Culture adequate volume   Culture   Final    NO GROWTH < 12 HOURS Performed at [facility], [address] [place], [place] [address]    Report Status PENDING  Incomplete  Blood culture (routine x 2)     Status: None (Preliminary result)   Collection Time: 07/01/22  7:36 PM   Specimen: BLOOD  Result Value Ref Range Status   Specimen Description BLOOD BLOOD RIGHT HAND  Final   Special Requests   Final    BOTTLES DRAWN AEROBIC AND ANAEROBIC Blood Culture adequate volume   Culture   Final    NO GROWTH < 12 HOURS Performed at [facility], [address]  [place], [place] [address]    Report Status PENDING  Incomplete    Anti-infectives:  Anti-infectives (From admission, onward)    Start     Dose/Rate Route Frequency Ordered Stop   07/01/22 2156  Uribel 118 MG CAPS 118 mg  Status:  Discontinued        1 capsule Oral Every 6 hours PRN 07/01/22 2158 07/01/22 2201        PAST MEDICAL HISTORY   Past Medical History:  Diagnosis Date   Anxiety    COPD (chronic obstructive pulmonary disease) (HCC)    Depressive disorder    GERD (gastroesophageal reflux disease)    Hiatal hernia    HLD (hyperlipidemia)    Hypertension    IBS (irritable bowel syndrome)    IC (interstitial cystitis)    Migraine    Nicotine dependence    Nontoxic goiter    Overactive bladder    Smoking      SURGICAL HISTORY   Past Surgical History:  Procedure Laterality Date   ABDOMINAL HYSTERECTOMY     ADENOIDECTOMY     CHOLECYSTECTOMY     HERNIA REPAIR     NECK SURGERY     SHOULDER SURGERY     TONSILLECTOMY       FAMILY HISTORY   Family History  Problem Relation Age of Onset   CAD Father        onset age 63   Heart disease Father    Kidney disease Father    Prostate cancer Father    Colon polyps Maternal Grandmother    Colon cancer Paternal Grandmother    Bladder Cancer Neg Hx    Kidney cancer  Neg Hx      SOCIAL HISTORY   Social History   Tobacco Use   Smoking status: Every Day    Packs/day: 0.50    Types: Cigarettes   Smokeless tobacco: Never  Substance Use Topics   Alcohol use: No   Drug use: No     MEDICATIONS   Current Medication:  Current Facility-Administered Medications:    0.9 %  sodium chloride infusion, , Intravenous, Continuous, Gonfa, Taye T, MD, Last Rate: 100 mL/hr at 07/02/22 1430, Rate Verify at 07/02/22 1430   acetaminophen (TYLENOL) tablet 650 mg, 650 mg, Oral, Q6H PRN, 650 mg at 07/02/22 [id number] **OR** acetaminophen (TYLENOL) suppository 650 mg, 650 mg, Rectal, Q6H PRN, Mansy, Jan A, MD   amLODipine (NORVASC)  tablet 10 mg, 10 mg, Oral, Daily, Mansy, Jan A, MD, 10 mg at 07/01/22 2216   atorvastatin (LIPITOR) tablet 40 mg, 40 mg, Oral, Daily, Mansy, Jan A, MD, 40 mg at 07/02/22 [id number]   benzonatate (TESSALON) capsule 100 mg, 100 mg, Oral, BID PRN, Mansy, Jan A, MD   budesonide (PULMICORT) nebulizer solution 0.5 mg, 0.5 mg, Nebulization, BID, Selinda EonMoore, Anderson S, RPH, 0.5 mg at 07/02/22 [id number]   butalbital-acetaminophen-caffeine (FIORICET) 50-325-40 MG per tablet 1 tablet, 1 tablet, Oral, Daily PRN, Mansy, Jan A, MD   calcium carbonate (OS-CAL - dosed in mg of elemental calcium) tablet 1,250 mg, 1 tablet, Oral, TID WC, Mansy, Jan A, MD, 1,250 mg at 07/02/22 0737   dicyclomine (BENTYL) capsule 10 mg, 10 mg, Oral, TID PRN, Mansy, Jan A, MD, 10 mg at 07/01/22 2227   enoxaparin (LOVENOX) injection 40 mg, 40 mg, Subcutaneous, Q24H, Mansy, Jan A, MD, 40 mg at 07/01/22 2229   FLUoxetine (PROZAC) capsule 10 mg, 10 mg, Oral, Daily, Mansy, Jan A, MD, 10 mg at 07/02/22 [id number]   gabapentin (NEURONTIN) capsule 300 mg, 300 mg, Oral, TID PRN, Mansy, Jan A, MD   magnesium hydroxide (MILK OF MAGNESIA) suspension 30 mL, 30 mL, Oral, Daily PRN, Mansy, Jan A, MD, 30 mL at 07/01/22 2228   metoprolol succinate (TOPROL-XL) 24 hr tablet 100 mg, 100 mg, Oral, Daily, Gonfa, Taye T, MD, 100 mg at 07/01/22 2216   norepinephrine (LEVOPHED) 4mg  in 250mL (0.016 mg/mL) premix infusion, 0-40 mcg/min, Intravenous, Continuous, Siadecki, Wilmon PaliSebastian, MD, Last Rate: 22.5 mL/hr at 07/02/22 1432, 6 mcg/min at 07/02/22 1432   ondansetron (ZOFRAN) tablet 4 mg, 4 mg, Oral, Q6H PRN **OR** ondansetron (ZOFRAN) injection 4 mg, 4 mg, Intravenous, Q6H PRN, Mansy, Jan A, MD, 4 mg at 07/01/22 2228   pantoprazole (PROTONIX) EC tablet 40 mg, 40 mg, Oral, Daily, Mansy, Jan A, MD, 40 mg at 07/02/22 0951   polyethylene glycol (MIRALAX / GLYCOLAX) packet 17 g, 17 g, Oral, Daily, Mansy, Jan A, MD, 17 g at 07/02/22 [id number]   QUEtiapine (SEROQUEL) tablet 100 mg, 100 mg, Oral,  BID, Mansy, Jan A, MD, 100 mg at 07/02/22 [id number]   senna-docusate (Senokot-S) tablet 2 tablet, 2 tablet, Oral, BID, Mansy, Jan A, MD, 2 tablet at 07/02/22 [id number]   sodium chloride 0.9 % bolus 1,000 mL, 1,000 mL, Intravenous, Once, Dionne BucySiadecki, Sebastian, MD, Held at 07/02/22 1200   sodium chloride tablet 1 g, 1 g, Oral, BID WC, Mansy, Jan A, MD, 1 g at 07/02/22 0736   sodium phosphate 30 mmol in dextrose 5 % 250 mL infusion, 30 mmol, Intravenous, Once, Gonfa, Taye T, MD   traZODone (DESYREL) tablet 25 mg, 25 mg, Oral, QHS PRN, Mansy, Vernetta HoneyJan A, MD  Vitamin D (Ergocalciferol) (DRISDOL) 1.25 MG (50000 UNIT) capsule 50,000 Units, 50,000 Units, Oral, Weekly, Mansy, Vernetta Honey, MD, 50,000 Units at 07/02/22 0033  Current Outpatient Medications:    ALPRAZolam (XANAX) 1 MG tablet, Take 1 mg by mouth 3 (three) times daily., Disp: , Rfl:    amLODipine (NORVASC) 10 MG tablet, Take 1 tablet (10 mg total) by mouth daily., Disp: , Rfl:    atorvastatin (LIPITOR) 40 MG tablet, Take 40 mg by mouth daily., Disp: , Rfl: 5   cloNIDine (CATAPRES) 0.1 MG tablet, Take 0.1 mg by mouth 2 (two) times daily., Disp: , Rfl:    esomeprazole (NEXIUM) 40 MG capsule, Take 40 mg by mouth daily at 12 noon., Disp: , Rfl:    FLOVENT HFA 220 MCG/ACT inhaler, Inhale 2 puffs into the lungs 2 (two) times daily., Disp: , Rfl:    FLUoxetine (PROZAC) 10 MG capsule, Take 10 mg by mouth daily., Disp: , Rfl:    metoprolol succinate (TOPROL-XL) 100 MG 24 hr tablet, Take 100 mg by mouth daily., Disp: , Rfl:    polyethylene glycol (MIRALAX / GLYCOLAX) 17 g packet, Take 17 g by mouth daily., Disp: 30 each, Rfl: 0   predniSONE (DELTASONE) 20 MG tablet, Take 3 tablets once daily for 3 days followed by 2 tablets once daily for 3 days followed by 1 tablet once daily for 3 days and then stop, Disp: 18 tablet, Rfl: 0   QUEtiapine (SEROQUEL) 100 MG tablet, Take 100 mg by mouth 2 (two) times daily., Disp: , Rfl:    senna-docusate (SENOKOT-S) 8.6-50 MG tablet, Take 2  tablets by mouth 2 (two) times daily., Disp: 60 tablet, Rfl: 0   sodium chloride 1 g tablet, Take 1 tablet (1 g total) by mouth 2 (two) times daily with a meal., Disp: 60 tablet, Rfl: 0   Vitamin D, Ergocalciferol, (DRISDOL) 1.25 MG (50000 UNIT) CAPS capsule, Take 50,000 Units by mouth once a week., Disp: , Rfl:    benzonatate (TESSALON) 100 MG capsule, Take 1 capsule (100 mg total) by mouth 2 (two) times daily as needed for cough., Disp: 20 capsule, Rfl: 0   butalbital-acetaminophen-caffeine (FIORICET) 50-325-40 MG tablet, Take 1 tablet by mouth daily as needed for headache or migraine., Disp: , Rfl:    cyclobenzaprine (FLEXERIL) 10 MG tablet, Take 10 mg by mouth 3 (three) times daily as needed for muscle spasms., Disp: , Rfl:    dicyclomine (BENTYL) 10 MG capsule, Take 1 capsule (10 mg total) by mouth 3 (three) times daily as needed for spasms., Disp: 30 capsule, Rfl: 0   gabapentin (NEURONTIN) 300 MG capsule, Take 1 capsule (300 mg total) by mouth 3 (three) times daily as needed (Pain)., Disp: 20 capsule, Rfl: 0   hydrOXYzine (ATARAX) 25 MG tablet, Take 25 mg by mouth 3 (three) times daily as needed for anxiety., Disp: , Rfl:    Meth-Hyo-M Bl-Na Phos-Ph Sal (URIBEL) 118 MG CAPS, Take 1 capsule (118 mg total) by mouth every 6 (six) hours as needed. (Patient taking differently: Take 1 capsule by mouth every 6 (six) hours as needed (pain).), Disp: 28 capsule, Rfl: 0   ondansetron (ZOFRAN) 4 MG tablet, Take 1 tablet (4 mg total) by mouth every 4 (four) hours as needed for nausea or vomiting., Disp: 10 tablet, Rfl: 0    ALLERGIES   Ciprofloxacin, Macrobid [nitrofurantoin monohyd macro], and Sulfa antibiotics    REVIEW OF SYSTEMS     Unable to obtain review of systems due to  encephalopathy with lethargy  PHYSICAL EXAMINATION   Vital Signs: Temp:  [97.4 F (36.3 C)-97.8 F (36.6 C)] 97.8 F (36.6 C) (01/13 1052) Pulse Rate:  [58-93] 70 (01/13 1430) Resp:  [14-25] 19 (01/13 1430) BP:  (71-178)/(51-97) 138/79 (01/13 1430) SpO2:  [92 %-100 %] 100 % (01/13 1430) Weight:  [68 kg] 68 kg (01/12 1851)  GENERAL:Age approprate NAD  HEAD: Normocephalic, atraumatic.  EYES: Pupils equal, round, reactive to light.  No scleral icterus.  MOUTH: Moist mucosal membrane. NECK: Supple. No thyromegaly. No nodules. No JVD.  PULMONARY: clear to auscultation bilaterally  CARDIOVASCULAR: S1 and S2. Regular rate and rhythm. No murmurs, rubs, or gallops.  GASTROINTESTINAL: Soft, nontender, non-distended. No masses. Positive bowel sounds. No hepatosplenomegaly.  MUSCULOSKELETAL: No swelling, clubbing, or edema.  NEUROLOGIC: encephalopathy GCS 10 SKIN:intact,warm,dry   PERTINENT DATA     Infusions:  sodium chloride 100 mL/hr at 07/02/22 1430   norepinephrine (LEVOPHED) Adult infusion 6 mcg/min (07/02/22 1432)   sodium chloride Stopped (07/02/22 1200)   sodium phosphate 30 mmol in dextrose 5 % 250 mL infusion     Scheduled Medications:  amLODipine  10 mg Oral Daily   atorvastatin  40 mg Oral Daily   budesonide (PULMICORT) nebulizer solution  0.5 mg Nebulization BID   calcium carbonate  1 tablet Oral TID WC   enoxaparin (LOVENOX) injection  40 mg Subcutaneous Q24H   FLUoxetine  10 mg Oral Daily   metoprolol succinate  100 mg Oral Daily   pantoprazole  40 mg Oral Daily   polyethylene glycol  17 g Oral Daily   QUEtiapine  100 mg Oral BID   senna-docusate  2 tablet Oral BID   sodium chloride  1 g Oral BID WC   Vitamin D (Ergocalciferol)  50,000 Units Oral Weekly   PRN Medications: acetaminophen **OR** acetaminophen, benzonatate, butalbital-acetaminophen-caffeine, dicyclomine, gabapentin, magnesium hydroxide, ondansetron **OR** ondansetron (ZOFRAN) IV, traZODone Hemodynamic parameters:   Intake/Output: 01/12 0701 - 01/13 0700 In: 1000 [IV Piggyback:1000] Out: 1900 [Urine:1900]  Ventilator  Settings:      LAB RESULTS:  Basic Metabolic Panel: Recent Labs  Lab 07/01/22 1859  07/01/22 2238 07/02/22 0139 07/02/22 0438 07/02/22 1116 07/02/22 1118 07/02/22 1317  NA 104* 114*  --  112*  --  115* 107*  K 2.2* <2.0*  --  2.7*  --  6.0* 4.2  CL 66* 84*  --  76*  --  89* 84*  CO2 26 22  --  27  --  24 20*  GLUCOSE 136* 81  --  103*  --  113* 85  BUN 8 6*  --  <5*  --  <5* <5*  CREATININE 0.50 0.38*  --  0.52  --  0.53 0.47  CALCIUM 7.1* 5.6*  --  6.9*  --  6.8* 5.5*  MG 1.4*  --  3.1*  --   --   --   --   PHOS  --   --   --   --  1.8*  --  1.5*   Liver Function Tests: Recent Labs  Lab 07/01/22 1859 07/02/22 0139 07/02/22 1118 07/02/22 1317  AST 42*  --  25  --   ALT 53*  --  37  --   ALKPHOS 54  --  44  --   BILITOT 0.9  --  0.6  --   PROT 6.0*  --  4.6*  --   ALBUMIN 3.1* 2.6* 2.3* 1.9*   Recent Labs  Lab 07/01/22  1859  LIPASE 28   Recent Labs  Lab 07/02/22 1115  AMMONIA 18   CBC: Recent Labs  Lab 07/01/22 1859 07/02/22 0438  WBC 17.6* 11.1*  NEUTROABS 13.8*  --   HGB 11.9* 11.0*  HCT 32.7* 30.1*  MCV 82.6 82.9  PLT 271 223   Cardiac Enzymes: No results for input(s): "CKTOTAL", "CKMB", "CKMBINDEX", "TROPONINI" in the last 168 hours. BNP: Invalid input(s): "POCBNP" CBG: No results for input(s): "GLUCAP" in the last 168 hours.     IMAGING RESULTS:  Imaging: CT HEAD WO CONTRAST ( )  Result Date: 07/02/2022 CLINICAL DATA:  Mental status change, unknown cause EXAM: CT HEAD WITHOUT CONTRAST TECHNIQUE: Contiguous axial images were obtained from the base of the skull through the vertex without intravenous contrast. RADIATION DOSE REDUCTION: This exam was performed according to the departmental dose-optimization program which includes automated exposure control, adjustment of the mA and/or kV according to patient size and/or use of iterative reconstruction technique. COMPARISON:  07/01/2022. FINDINGS: Brain: No evidence of acute infarction, hemorrhage, hydrocephalus, extra-axial collection or mass lesion/mass effect. Vascular: No  hyperdense vessel or unexpected calcification. Skull: Normal. Negative for fracture or focal lesion. Sinuses/Orbits: No acute finding. IMPRESSION: No acute intracranial process. Electronically Signed   By: Layla Maw M.D.   On: 07/02/2022 11:51   CT HEAD WO CONTRAST ( )  Result Date: 07/01/2022 CLINICAL DATA:  Recent syncopal episode and headaches, initial encounter EXAM: CT HEAD WITHOUT CONTRAST TECHNIQUE: Contiguous axial images were obtained from the base of the skull through the vertex without intravenous contrast. RADIATION DOSE REDUCTION: This exam was performed according to the departmental dose-optimization program which includes automated exposure control, adjustment of the mA and/or kV according to patient size and/or use of iterative reconstruction technique. COMPARISON:  06/21/2022 FINDINGS: Brain: Examination is somewhat limited by patient motion artifact. No findings to suggest acute hemorrhage, acute infarction or space-occupying mass lesion are noted. Vascular: No hyperdense vessel or unexpected calcification. Skull: Normal. Negative for fracture or focal lesion. Sinuses/Orbits: No acute finding. Other: None. IMPRESSION: Somewhat limited exam due to patient motion artifact. No acute abnormality is noted. Electronically Signed   By: Alcide Clever M.D.   On: 07/01/2022 20:50   CT ABDOMEN PELVIS W CONTRAST  Result Date: 07/01/2022 CLINICAL DATA:  Acute abdominal pain and vomiting, initial encounter EXAM: CT ABDOMEN AND PELVIS WITH CONTRAST TECHNIQUE: Multidetector CT imaging of the abdomen and pelvis was performed using the standard protocol following bolus administration of intravenous contrast. RADIATION DOSE REDUCTION: This exam was performed according to the departmental dose-optimization program which includes automated exposure control, adjustment of the mA and/or kV according to patient size and/or use of iterative reconstruction technique. CONTRAST:  OMNIPAQUE IOHEXOL 300  MG/ML  SOLN COMPARISON:  06/19/2022 FINDINGS: Lower chest: Lung bases demonstrate diffuse emphysematous changes. Small pericardial effusion is noted new from the prior exam. Fluid is noted in the distal esophagus likely related to combination of hiatal hernia and reflux. Hepatobiliary: No focal liver abnormality is seen. Status post cholecystectomy. No biliary dilatation. Pancreas: Unremarkable. No pancreatic ductal dilatation or surrounding inflammatory changes. Spleen: Normal in size without focal abnormality. Adrenals/Urinary Tract: Adrenal glands are within normal limits. Kidneys demonstrate a normal enhancement pattern bilaterally. Mild fullness of the collecting systems and ureters is seen bilaterally likely related to a distended bladder as no mass or stone is seen. The bladder is well distended. A Foley catheter is noted in place however. Stomach/Bowel: No obstructive or inflammatory changes of colon are seen.  Appendix is within normal limits. Small bowel is within normal limits. Stomach is distended with fluid. Vascular/Lymphatic: Aortic atherosclerosis. No enlarged abdominal or pelvic lymph nodes. Reproductive: Status post hysterectomy. No adnexal masses. Other: No abdominal wall hernia or abnormality. No abdominopelvic ascites. Musculoskeletal: Degenerative changes of lumbar spine are noted. Mild anterolisthesis of L4 on L5 is noted. IMPRESSION: Distention of the bladder despite a Foley catheter in place. This results in fullness of the collecting systems as well. New small pericardial effusion. Small hiatal hernia and reflux. Electronically Signed   By: Alcide CleverMark  Lukens M.D.   On: 07/01/2022 20:47   DG Chest Portable 1 View  Result Date: 07/01/2022 CLINICAL DATA:  Vomiting and syncopal episode, initial encounter EXAM: PORTABLE CHEST 1 VIEW COMPARISON:  06/21/2022 FINDINGS: The heart size and mediastinal contours are within normal limits. Both lungs are clear. The visualized skeletal structures are  unremarkable. IMPRESSION: No active disease. Electronically Signed   By: Alcide CleverMark  Lukens M.D.   On: 07/01/2022 19:06   @PROBHOSP @ CT HEAD WO CONTRAST (5MM)  Result Date: 07/02/2022 CLINICAL DATA:  Mental status change, unknown cause EXAM: CT HEAD WITHOUT CONTRAST TECHNIQUE: Contiguous axial images were obtained from the base of the skull through the vertex without intravenous contrast. RADIATION DOSE REDUCTION: This exam was performed according to the departmental dose-optimization program which includes automated exposure control, adjustment of the mA and/or kV according to patient size and/or use of iterative reconstruction technique. COMPARISON:  07/01/2022. FINDINGS: Brain: No evidence of acute infarction, hemorrhage, hydrocephalus, extra-axial collection or mass lesion/mass effect. Vascular: No hyperdense vessel or unexpected calcification. Skull: Normal. Negative for fracture or focal lesion. Sinuses/Orbits: No acute finding. IMPRESSION: No acute intracranial process. Electronically Signed   By: Layla MawJoshua  Pleasure M.D.   On: 07/02/2022 11:51   CT HEAD WO CONTRAST (5MM)  Result Date: 07/01/2022 CLINICAL DATA:  Recent syncopal episode and headaches, initial encounter EXAM: CT HEAD WITHOUT CONTRAST TECHNIQUE: Contiguous axial images were obtained from the base of the skull through the vertex without intravenous contrast. RADIATION DOSE REDUCTION: This exam was performed according to the departmental dose-optimization program which includes automated exposure control, adjustment of the mA and/or kV according to patient size and/or use of iterative reconstruction technique. COMPARISON:  06/21/2022 FINDINGS: Brain: Examination is somewhat limited by patient motion artifact. No findings to suggest acute hemorrhage, acute infarction or space-occupying mass lesion are noted. Vascular: No hyperdense vessel or unexpected calcification. Skull: Normal. Negative for fracture or focal lesion. Sinuses/Orbits: No acute  finding. Other: None. IMPRESSION: Somewhat limited exam due to patient motion artifact. No acute abnormality is noted. Electronically Signed   By: Alcide CleverMark  Lukens M.D.   On: 07/01/2022 20:50   CT ABDOMEN PELVIS W CONTRAST  Result Date: 07/01/2022 CLINICAL DATA:  Acute abdominal pain and vomiting, initial encounter EXAM: CT ABDOMEN AND PELVIS WITH CONTRAST TECHNIQUE: Multidetector CT imaging of the abdomen and pelvis was performed using the standard protocol following bolus administration of intravenous contrast. RADIATION DOSE REDUCTION: This exam was performed according to the departmental dose-optimization program which includes automated exposure control, adjustment of the mA and/or kV according to patient size and/or use of iterative reconstruction technique. CONTRAST:  100mL OMNIPAQUE IOHEXOL 300 MG/ML  SOLN COMPARISON:  06/19/2022 FINDINGS: Lower chest: Lung bases demonstrate diffuse emphysematous changes. Small pericardial effusion is noted new from the prior exam. Fluid is noted in the distal esophagus likely related to combination of hiatal hernia and reflux. Hepatobiliary: No focal liver abnormality is seen. Status post cholecystectomy. No  biliary dilatation. Pancreas: Unremarkable. No pancreatic ductal dilatation or surrounding inflammatory changes. Spleen: Normal in size without focal abnormality. Adrenals/Urinary Tract: Adrenal glands are within normal limits. Kidneys demonstrate a normal enhancement pattern bilaterally. Mild fullness of the collecting systems and ureters is seen bilaterally likely related to a distended bladder as no mass or stone is seen. The bladder is well distended. A Foley catheter is noted in place however. Stomach/Bowel: No obstructive or inflammatory changes of colon are seen. Appendix is within normal limits. Small bowel is within normal limits. Stomach is distended with fluid. Vascular/Lymphatic: Aortic atherosclerosis. No enlarged abdominal or pelvic lymph nodes.  Reproductive: Status post hysterectomy. No adnexal masses. Other: No abdominal wall hernia or abnormality. No abdominopelvic ascites. Musculoskeletal: Degenerative changes of lumbar spine are noted. Mild anterolisthesis of L4 on L5 is noted. IMPRESSION: Distention of the bladder despite a Foley catheter in place. This results in fullness of the collecting systems as well. New small pericardial effusion. Small hiatal hernia and reflux. Electronically Signed   By: Inez Catalina M.D.   On: 07/01/2022 20:47   DG Chest Portable 1 View  Result Date: 07/01/2022 CLINICAL DATA:  Vomiting and syncopal episode, initial encounter EXAM: PORTABLE CHEST 1 VIEW COMPARISON:  06/21/2022 FINDINGS: The heart size and mediastinal contours are within normal limits. Both lungs are clear. The visualized skeletal structures are unremarkable. IMPRESSION: No active disease. Electronically Signed   By: Inez Catalina M.D.   On: 07/01/2022 19:06     ASSESSMENT AND PLAN    -Multidisciplinary rounds held today  Altered mental status with encephalopathy -Present on admission likely due to severe hyponatremia, investigate underlying etiology and treat culprit of severe hyponatremia.  RN reporting that mentation has improved after initiation of vasopressor support.   -Additionally patient will have microbiology with blood cultures, urine culture and empiric therapy for possible septic encephalopathy. -Metabolic encephalopathy is on the differential due to severe electrolyte derangements aside from sodium including potassium, phosphorus, magnesium, calcium.   Hypoosmolar hypovolemic hyponatremia   - possible drug related - patient on fluoxetine, esomeprazole  and takes NSAIDS  - unclear EtOH history - patient does have transaminitis and this may be due to potomania   - urine osm is elevated will obtain urine Na.   -TSH and cortisol are normal    Circulatory shock -unclear etiology at this moment , she is receiving IVF  - this  may be related to sepsis in lieu of elevated wbc count and we have ordered septic workup.  PRESENT ON ADMISSION -follow UO -continue Foley Catheter-assess need daily -On levophed currently  -empirically on vancomycin and zosyn -MRSA PCR  ID -continue IV abx as prescibed -follow up cultures  GI/Nutrition GI PROPHYLAXIS as indicated DIET-->TF's as tolerated Constipation protocol as indicated  ENDO - ICU hypoglycemic\Hyperglycemia protocol -check FSBS per protocol   ELECTROLYTES -follow labs as needed -replace as needed -pharmacy consultation   DVT/GI PRX ordered -SCDs  TRANSFUSIONS AS NEEDED MONITOR FSBS ASSESS the need for LABS as needed   Critical care provider statement:   Total critical care time: 33 minutes   Performed by: Lanney Gins MD   Critical care time was exclusive of separately billable procedures and treating other patients.   Critical care was necessary to treat or prevent imminent or life-threatening deterioration.   Critical care was time spent personally by me on the following activities: development of treatment plan with patient and/or surrogate as well as nursing, discussions with consultants, evaluation of patient's response to treatment,  examination of patient, obtaining history from patient or surrogate, ordering and performing treatments and interventions, ordering and review of laboratory studies, ordering and review of radiographic studies, pulse oximetry and re-evaluation of patient's condition.    Ottie Glazier, M.D.  Pulmonary & Critical Care Medicine

## 2022-07-02 NOTE — ED Notes (Signed)
Attending T. Gonfa at bedside.

## 2022-07-02 NOTE — ED Notes (Signed)
Pt is more responsive. Pt will open eyes when this RN enter's room.

## 2022-07-02 NOTE — ED Notes (Signed)
Pt confirmed for this RN that she is not currently wet; pt specifically asked if she is wet from urine or stool and pt denied. Pt agrees to use call bell for any needs.

## 2022-07-02 NOTE — ED Notes (Signed)
Attending paged now. Awaiting reply/orders.

## 2022-07-02 NOTE — Assessment & Plan Note (Signed)
-  We will continue PPI therapy.

## 2022-07-02 NOTE — ED Notes (Signed)
Attending paged now as previous page by secretary made to wrong provider. Awaiting reply/orders.

## 2022-07-02 NOTE — ED Notes (Signed)
Called phlebotomy to assist with collection of blood as attending requested. Lab staff states they will come asap.

## 2022-07-02 NOTE — Progress Notes (Signed)
eLink Physician-Brief Progress Note Patient Name: Katelyn [name] DOB: July 27, 1960 MRN: [id number]   Date of Service  07/02/2022  HPI/Events of Note  62 year old female with a history of anxiety and depression, essential hypertension, GERD, dyslipidemia COPD with active tobacco abuse who came into the ED with altered mental status and diarrhea. She was found to be lethargic and with severe hyponatremia with a sodium of 104.  She received saline for volume repletion and is on salt tablets.  Last sodium 114 with next sodium due now.  Exact cause of hyponatremia not known.   eICU Interventions  Chart reviewed     Intervention Category Evaluation Type: New Patient Evaluation  Mauri Brooklyn, P 07/02/2022, 10:17 PM

## 2022-07-02 NOTE — ED Notes (Signed)
Pt leaving for CT.  

## 2022-07-02 NOTE — ED Notes (Signed)
Attending T. Gonfa notified of critical calcium 5.5 and sodium 107.

## 2022-07-02 NOTE — ED Notes (Signed)
This RN notified by float RN that she straight-stuck pt for most recent/repeat cmp collected at 1317. Attending notified. Called lab to notify phlebotomy they're no longer needed at bedside.

## 2022-07-02 NOTE — Assessment & Plan Note (Signed)
-  Magnesium will be replaced and followed. 

## 2022-07-02 NOTE — Assessment & Plan Note (Addendum)
-  The patient will be admitted to a stepdown unit bed. - We will continue hydration with IV normal saline. - We will follow serial BMPs. - We will obtain hyponatremia workup. - We will apply fluid restriction.

## 2022-07-02 NOTE — ED Notes (Signed)
Attending notified of critical sodium 114.

## 2022-07-03 ENCOUNTER — Inpatient Hospital Stay (HOSPITAL_COMMUNITY)
Admit: 2022-07-03 | Discharge: 2022-07-03 | Disposition: A | Discharge status: Home / Self Care | Payer: Medicaid Other | Attending: Student | Admitting: Student

## 2022-07-03 DIAGNOSIS — E871 Hypo-osmolality and hyponatremia: Secondary | ICD-10-CM | POA: Diagnosis not present

## 2022-07-03 DIAGNOSIS — R012 Other cardiac sounds: Secondary | ICD-10-CM | POA: Diagnosis not present

## 2022-07-03 DIAGNOSIS — K529 Noninfective gastroenteritis and colitis, unspecified: Secondary | ICD-10-CM | POA: Diagnosis not present

## 2022-07-03 DIAGNOSIS — E876 Hypokalemia: Secondary | ICD-10-CM | POA: Diagnosis not present

## 2022-07-03 LAB — URINE CULTURE
Culture: NO GROWTH
Special Requests: NORMAL

## 2022-07-03 LAB — RENAL FUNCTION PANEL
Albumin: 2.7 g/dL — ABNORMAL LOW (ref 3.5–5.0)
Albumin: 2.6 g/dL — ABNORMAL LOW (ref 3.5–5.0)
Albumin: 2.6 g/dL — ABNORMAL LOW (ref 3.5–5.0)
Albumin: 2.8 g/dL — ABNORMAL LOW (ref 3.5–5.0)
Anion gap: 7 (ref 5–15)
Anion gap: 4 — ABNORMAL LOW (ref 5–15)
Anion gap: 4 — ABNORMAL LOW (ref 5–15)
Anion gap: 5 (ref 5–15)
BUN: 5 mg/dL — ABNORMAL LOW (ref 8–23)
BUN: <5 mg/dL — ABNORMAL LOW (ref 8–23)
BUN: <5 mg/dL — ABNORMAL LOW (ref 8–23)
BUN: 5 mg/dL — ABNORMAL LOW (ref 8–23)
CO2: 24 mmol/L (ref 22–32)
CO2: 25 mmol/L (ref 22–32)
CO2: 25 mmol/L (ref 22–32)
CO2: 26 mmol/L (ref 22–32)
Calcium: 7.7 mg/dL — ABNORMAL LOW (ref 8.9–10.3)
Calcium: 7.4 mg/dL — ABNORMAL LOW (ref 8.9–10.3)
Calcium: 7.9 mg/dL — ABNORMAL LOW (ref 8.9–10.3)
Calcium: 7.9 mg/dL — ABNORMAL LOW (ref 8.9–10.3)
Chloride: 98 mmol/L (ref 98–111)
Chloride: 98 mmol/L (ref 98–111)
Chloride: 100 mmol/L (ref 98–111)
Chloride: 98 mmol/L (ref 98–111)
Creatinine, Ser: 0.6 mg/dL (ref 0.44–1.00)
Creatinine, Ser: 0.55 mg/dL (ref 0.44–1.00)
Creatinine, Ser: 0.67 mg/dL (ref 0.44–1.00)
Creatinine, Ser: 0.68 mg/dL (ref 0.44–1.00)
GFR, Estimated: >60 mL/min (ref >60)
GFR, Estimated: >60 mL/min (ref >60)
GFR, Estimated: >60 mL/min (ref >60)
GFR, Estimated: >60 mL/min (ref >60)
Glucose, Bld: 120 mg/dL — ABNORMAL HIGH (ref 70–99)
Glucose, Bld: 103 mg/dL — ABNORMAL HIGH (ref 70–99)
Glucose, Bld: 105 mg/dL — ABNORMAL HIGH (ref 70–99)
Glucose, Bld: 111 mg/dL — ABNORMAL HIGH (ref 70–99)
Phosphorus: 4 mg/dL (ref 2.5–4.6)
Phosphorus: 3.3 mg/dL (ref 2.5–4.6)
Phosphorus: 3.1 mg/dL (ref 2.5–4.6)
Phosphorus: 2.9 mg/dL (ref 2.5–4.6)
Potassium: 4.6 mmol/L (ref 3.5–5.1)
Potassium: 4.4 mmol/L (ref 3.5–5.1)
Potassium: 4.5 mmol/L (ref 3.5–5.1)
Potassium: 4.2 mmol/L (ref 3.5–5.1)
Sodium: 129 mmol/L — ABNORMAL LOW (ref 135–145)
Sodium: 127 mmol/L — ABNORMAL LOW (ref 135–145)
Sodium: 129 mmol/L — ABNORMAL LOW (ref 135–145)
Sodium: 129 mmol/L — ABNORMAL LOW (ref 135–145)

## 2022-07-03 LAB — ECHOCARDIOGRAM COMPLETE
AR max vel: 1.97 cm2
AV Area VTI: 1.91 cm2
AV Area mean vel: 1.82 cm2
AV Mean grad: 10 mmHg
AV Peak grad: 18.1 mmHg
Ao pk vel: 2.13 m/s
Area-P 1/2: 3.72 cm2
Calc EF: 81.4 %
Height: 64 in
MV VTI: 3.01 cm2
S' Lateral: 2.5 cm
Single Plane A2C EF: 82.6 %
Single Plane A4C EF: 81.1 %
Weight: 2400 oz

## 2022-07-03 LAB — MAGNESIUM: Magnesium: 2.1 mg/dL (ref 1.7–2.4)

## 2022-07-03 LAB — HIV ANTIBODY (ROUTINE TESTING W REFLEX): HIV Screen 4th Generation wRfx: NONREACTIVE

## 2022-07-03 MED ORDER — CALCIUM GLUCONATE-NACL 1-0.675 GM/50ML-% IV SOLN
1.0000 g | Freq: Once | INTRAVENOUS | Status: AC
  Administered 2022-07-03: 1000 mg via INTRAVENOUS
  Filled 2022-07-03: qty 50

## 2022-07-03 MED ORDER — GUAIFENESIN-DM 100-10 MG/5ML PO SYRP: 5.0000 mL | ORAL_SOLUTION | ORAL | Status: DC | PRN

## 2022-07-03 MED ORDER — METOPROLOL SUCCINATE ER 25 MG PO TB24
25.0000 mg | ORAL_TABLET | Freq: Every day | ORAL | Status: DC
  Administered 2022-07-03: 25 mg via ORAL
  Filled 2022-07-03: qty 1

## 2022-07-03 MED ORDER — QUETIAPINE FUMARATE 25 MG PO TABS
25.0000 mg | ORAL_TABLET | Freq: Two times a day (BID) | ORAL | Status: DC
  Administered 2022-07-03 (×2): 25 mg via ORAL
  Filled 2022-07-03 (×4): qty 1

## 2022-07-03 MED ORDER — GUAIFENESIN ER 600 MG PO TB12
600.0000 mg | ORAL_TABLET | Freq: Two times a day (BID) | ORAL | Status: DC
  Administered 2022-07-03 – 2022-07-05 (×5): 600 mg via ORAL
  Filled 2022-07-03 (×5): qty 1

## 2022-07-03 MED ORDER — SODIUM CHLORIDE 0.45 % IV SOLN: INTRAVENOUS | Status: DC

## 2022-07-03 MED ORDER — VANCOMYCIN HCL 1500 MG/300ML IV SOLN: 1500.0000 mg | INTRAVENOUS | Status: DC

## 2022-07-03 MED ORDER — MAGIC MOUTHWASH W/LIDOCAINE
5.0000 mL | Freq: Four times a day (QID) | ORAL | Status: DC
  Administered 2022-07-03 – 2022-07-05 (×5): 5 mL via ORAL
  Filled 2022-07-03 (×14): qty 5

## 2022-07-03 NOTE — Consult Note (Signed)
PHARMACY CONSULT NOTE - FOLLOW UP  Pharmacy Consult for Electrolyte Monitoring and Replacement   Recent Labs: Potassium (mmol/L)  Date Value  07/03/2022 4.4  10/09/2012 4.1   Magnesium (mg/dL)  Date Value  07/03/2022 2.1   Calcium (mg/dL)  Date Value  07/03/2022 7.4 (L)   Calcium, Total (mg/dL)  Date Value  10/09/2012 8.9   Albumin (g/dL)  Date Value  07/03/2022 2.6 (L)   Phosphorus (mg/dL)  Date Value  07/03/2022 3.3   Sodium (mmol/L)  Date Value  07/03/2022 127 (L)  10/09/2012 132 (L)   Corrected Ca: 8.5  Assessment: 62 year old female with a history of anxiety and depression, essential hypertension, GERD, dyslipidemia COPD with active tobacco abuse who came into the ED with altered mental status and diarrhea. Found to be lethargic and with severe hyponatremia with a sodium of 104. Pharmacy has been consulted to monitor and replace electrolytes as needed.   Pertinent medications: IVF: NS @ 100 mL/hr PO NaCl 1 gram tabs: 1 gram BID  Goal of Therapy:  Electrolytes WNL  Plan:  Sodium: Na 104-->114>107-->129>127: d/t overcorrection, will stop NS and transition to 1/2NS@100ml /hr(ordered by MD), nephrology consulted.  Calcium: 7.4, corrected Ca 8.5: Will order 1 gram IV calcium gluconate x 1, patient too lethargic to take PO replacement as documented by RN Phosphorous: 3.3: No further correction warranted Potassium: >2.0 >> 4.4: s/p replacement by MD Q6H renal function panel ordered by provider Follow up with AM labs   Pearla Dubonnet, PharmD Clinical Pharmacist 07/03/2022 10:46 AM\

## 2022-07-03 NOTE — Progress Notes (Signed)
PROGRESS NOTE  Katelyn [name] ZOX:[id number] DOB: 16-Apr-1961   PCP: [name], MD  Patient is from: Home.  DOA: 07/01/2022 LOS: 2  Chief complaints Generalized weakness  Brief Narrative / Interim history: 62 year old F with PMH of COPD, anxiety, depression, GERD, HTN, HLD, chronic pain, hyponatremia and tobacco use disorder presented to ED with nausea, vomiting, diarrhea and abdominal pain for about 5 days and admitted with significant electrolyte derangements. Na 104.  K2.2.  Mg 1.4. Cl 66.  WBC 17.6 with left shift.  EKG with QTc of 527.  CT abdomen and pelvis showed distended bladder despite Foley catheter with fullness of the collecting system.  CXR without acute finding.  Patient was started on IV NS, potassium and magnesium supplementation and admitted for severe hyponatremia and electrolyte derangement.  Patient had acute hypotension and encephalopathy the morning of 07/02/2021.  Encephalopathy workup including CT, ABG, ammonia and infectious workup unrevealing.  Concern about iatrogenic etiology.  She was started on Levophed by PIV.  PCCM consulted as well.  1/14, encephalopathy and hypotension resolved.  She came off Levophed.   Subjective: Seen and examined earlier this morning.  No major events overnight of this morning.  She is awake and oriented x 4.  She states "I do not feel good".  Not able to elaborate.  She denies pain but admits to feeling tired.  She also reports cough and shortness of breath.  She denies nausea but no emesis.  She had diarrhea last night.  Unfortunately, stool sample was not collected.  Also reports pain pain on her tongue.   Objective: Vitals:   07/03/22 0800 07/03/22 0900 07/03/22 1000 07/03/22 1100  BP: (!) 124/59 100/64 (!) 110/56 (!) 110/94  Pulse:   94 88  Resp:   (!) 22 19  Temp: 98 F (36.7 C)     TempSrc: Oral     SpO2: 98%  98% 97%  Weight:      Height:        Examination:  GENERAL: No apparent distress.  Nontoxic. HEENT:  MMM. Healing ulcers on underside of her tongue on both sides. Vision and hearing grossly intact.  NECK: Supple.  No apparent JVD.  RESP:  No IWOB.  Fair aeration bilaterally.  Rhonchi bilaterally. CVS:  RRR. Heart sounds normal.  ABD/GI/GU: BS+. Abd soft, NTND.  MSK/EXT:  Moves extremities. No apparent deformity. No edema.  SKIN: no apparent skin lesion or wound NEURO: Awake and alert. Oriented appropriately.  No apparent focal neuro deficit. PSYCH: Calm. Normal affect.   Procedures:  None  Microbiology summarized: [id number] and influenza PCR nonreactive. Blood cultures NGTD Urine culture NGTD. C. difficile and GIP pending   Assessment and plan: Principal Problem:   Hyponatremia Active Problems:   Hypokalemia   Hypomagnesemia   Acute gastroenteritis   Essential hypertension   Acute encephalopathy   COPD (chronic obstructive pulmonary disease) (HCC)   Dyslipidemia   Anxiety and depression   GERD without esophagitis   Hypotension   Nausea vomiting and diarrhea  Severe hyponatremia: Hx of chronic hyponatremia. Na 104 on presentation and improved to 129.  Urine sodium elevated suggesting SIADH.  However, sodium level improved with IV fluid. Random cortisol 9.3.  TSH normal. -Change IV NS to 1/2NS -Follow ACTH level and echocardiogram -Continue monitoring  Hypokalemia/hypomagnesemia/hypochloremia/hypophosphatemia: Resolved. -Monitor daily  Acute encephalopathy: Likely iatrogenic from Xanax and Seroquel.  She says she has been out of his Xanax for a long time.  She is not sure if  she could have taken home additional medications from home bottles.  She has a bottle of Flexeril.  Encephalopathy workup including CT head, ABG, ammonia, B12, TSH and infectious workup unrevealing.  She has no focal neurodeficit.  In cephalopathy resolved. -Fall, aspiration and delirium precaution -Discontinued sedating medications  Hypotension/circulatory shock: Likely iatrogenic. Required Levophed  1/13-1/14.  Now normotensive off Levophed.    Started on vancomycin and Zosyn by intensivist. Infectious workup unrevealing including bladder urine cultures and MRSA PCR negative..  -Discontinue vancomycin. -Defer IV Zosyn to intensivist -Decrease Toprol-XL from 100 to 25 mg daily -Discontinue amlodipine and clonidine -Follow ACTH and echocardiogram.  Nausea/vomiting/diarrhea: Denies emesis.  Reports nausea.  Had some watery stool last night.  Abdominal exam benign. -Follow C. difficile and GIP -IV fluid as above.  Anxiety/depression/chronic pain -Discontinue Xanax.  She states she no longer take this. -Decrease Seroquel to 25 mg  Urinary retention? -Continue indwelling Foley catheter -Voiding trial prior to discharge.  Chronic COPD: Stable.  ABG reassuring. -Inhalers and nebulizers  Tongue ulcer: healing ulcers on underside of her tongue on both sides.  Unclear etiology of this. -Magic mouthwash  Generalized weakness/physical deconditioning -PT/OT eval   Body mass index is 25.75 kg/m.           DVT prophylaxis:  enoxaparin (LOVENOX) injection 40 mg Start: 07/01/22 2200  Code Status: DNR/DNI Family Communication: None at bedside.   Level of care: Stepdown Status is: Inpatient Remains inpatient appropriate because: severe hyponatremia, circulatory shock and other electrolyte derangements   Final disposition: TBD Consultants:  Pulmonology/critical care    Sch Meds:  Scheduled Meds:  atorvastatin  40 mg Oral Daily   budesonide (PULMICORT) nebulizer solution  0.5 mg Nebulization BID   Chlorhexidine Gluconate Cloth  6 each Topical Daily   enoxaparin (LOVENOX) injection  40 mg Subcutaneous Q24H   FLUoxetine  10 mg Oral Daily   guaiFENesin  600 mg Oral BID   magic mouthwash w/lidocaine  5 mL Oral QID   metoprolol succinate  25 mg Oral Daily   pantoprazole  40 mg Oral Daily   polyethylene glycol  17 g Oral Daily   QUEtiapine  25 mg Oral BID   senna-docusate   2 tablet Oral BID   sodium chloride  1 g Oral BID WC   Vitamin D (Ergocalciferol)  50,000 Units Oral Weekly   Continuous Infusions:  sodium chloride 100 mL/hr at 07/03/22 1000   norepinephrine (LEVOPHED) Adult infusion Stopped (07/03/22 0940)   piperacillin-tazobactam (ZOSYN)  IV 12.5 mL/hr at 07/03/22 1000   sodium chloride Stopped (07/02/22 1200)   PRN Meds:.acetaminophen **OR** acetaminophen, benzonatate, butalbital-acetaminophen-caffeine, dicyclomine, gabapentin, guaiFENesin-dextromethorphan, magnesium hydroxide, ondansetron **OR** ondansetron (ZOFRAN) IV, traZODone  Antimicrobials: Anti-infectives (From admission, onward)    Start     Dose/Rate Route Frequency Ordered Stop   07/03/22 1800  vancomycin (VANCOREADY) IVPB 1750 mg/350 mL  Status:  Discontinued        1,750 mg 175 mL/hr over 120 Minutes Intravenous Every 24 hours 07/02/22 1551 07/03/22 1048   07/03/22 1800  vancomycin (VANCOREADY) IVPB 1500 mg/300 mL  Status:  Discontinued        1,500 mg 150 mL/hr over 120 Minutes Intravenous Every 24 hours 07/03/22 1048 07/03/22 1133   07/03/22 0000  vancomycin (VANCOREADY) IVPB 1750 mg/350 mL  Status:  Discontinued        1,750 mg 175 mL/hr over 120 Minutes Intravenous Every 24 hours 07/02/22 1549 07/02/22 1551   07/02/22 1600  vancomycin (VANCOREADY) IVPB  1500 mg/300 mL        1,500 mg 150 mL/hr over 120 Minutes Intravenous  Once 07/02/22 1549 07/02/22 1953   07/02/22 1600  piperacillin-tazobactam (ZOSYN) IVPB 3.375 g        3.375 g 12.5 mL/hr over 240 Minutes Intravenous Every 8 hours 07/02/22 1549     07/01/22 2156  Uribel 118 MG CAPS 118 mg  Status:  Discontinued        1 capsule Oral Every 6 hours PRN 07/01/22 2158 07/01/22 2201        I have personally reviewed the following labs and images: CBC: Recent Labs  Lab 07/01/22 1859 07/02/22 0438  WBC 17.6* 11.1*  NEUTROABS 13.8*  --   HGB 11.9* 11.0*  HCT 32.7* 30.1*  MCV 82.6 82.9  PLT 271 223   BMP &GFR Recent  Labs  Lab 07/01/22 1859 07/01/22 2238 07/02/22 0139 07/02/22 0438 07/02/22 1116 07/02/22 1118 07/02/22 1317 07/02/22 1413 07/02/22 2114 07/03/22 0313 07/03/22 0319 07/03/22 0853  NA 104*   < >  --    < >  --    < > 107* 114* 128*  --  129* 127*  K 2.2*   < >  --    < >  --    < > 4.2 4.7 5.0  --  4.6 4.4  CL 66*   < >  --    < >  --    < > 84* 87* 98  --  98 98  CO2 26   < >  --    < >  --    < > 20* 21* 24  --  24 25  GLUCOSE 136*   < >  --    < >  --    < > 85 99 96  --  120* 103*  BUN 8   < >  --    < >  --    < > <5* <5* <5*  --  5* <5*  CREATININE 0.50   < >  --    < >  --    < > 0.47 0.46 0.54  --  0.60 0.55  CALCIUM 7.1*   < >  --    < >  --    < > 5.5* 6.6* 7.7*  --  7.7* 7.4*  MG 1.4*  --  3.1*  --   --   --   --   --   --  2.1  --   --   PHOS  --   --   --   --  1.8*  --  1.5*  --  2.9  --  4.0 3.3   < > = values in this interval not displayed.   Estimated Creatinine Clearance: 69.1 mL/min (by C-G formula based on SCr of 0.55 mg/dL). Liver & Pancreas: Recent Labs  Lab 07/01/22 1859 07/02/22 0139 07/02/22 1118 07/02/22 1317 07/02/22 2114 07/03/22 0319 07/03/22 0853  AST 42*  --  25  --   --   --   --   ALT 53*  --  37  --   --   --   --   ALKPHOS 54  --  44  --   --   --   --   BILITOT 0.9  --  0.6  --   --   --   --   PROT 6.0*  --  4.6*  --   --   --   --  ALBUMIN 3.1*   < > 2.3* 1.9* 2.7* 2.7* 2.6*   < > = values in this interval not displayed.   Recent Labs  Lab 07/01/22 1859  LIPASE 28   Recent Labs  Lab 07/02/22 1115  AMMONIA 18   Diabetic: No results for input(s): "HGBA1C" in the last 72 hours. Recent Labs  Lab 07/02/22 1444 07/02/22 1955  GLUCAP 104* 117*   Cardiac Enzymes: No results for input(s): "CKTOTAL", "CKMB", "CKMBINDEX", "TROPONINI" in the last 168 hours. No results for input(s): "PROBNP" in the last 8760 hours. Coagulation Profile: No results for input(s): "INR", "PROTIME" in the last 168 hours. Thyroid Function  Tests: Recent Labs    07/02/22 1317  TSH 0.563   Lipid Profile: No results for input(s): "CHOL", "HDL", "LDLCALC", "TRIG", "CHOLHDL", "LDLDIRECT" in the last 72 hours. Anemia Panel: No results for input(s): "VITAMINB12", "FOLATE", "FERRITIN", "TIBC", "IRON", "RETICCTPCT" in the last 72 hours. Urine analysis:    Component Value Date/Time   COLORURINE COLORLESS (A) 07/01/2022 2354   APPEARANCEUR CLEAR (A) 07/01/2022 2354   APPEARANCEUR Clear 05/24/2022 1536   LABSPEC 1.005 07/01/2022 2354   PHURINE 7.0 07/01/2022 2354   GLUCOSEU NEGATIVE 07/01/2022 2354   HGBUR SMALL (A) 07/01/2022 2354   BILIRUBINUR NEGATIVE 07/01/2022 2354   BILIRUBINUR Negative 05/24/2022 1536   KETONESUR 5 (A) 07/01/2022 2354   PROTEINUR NEGATIVE 07/01/2022 2354   UROBILINOGEN 0.2 11/13/2007 2030   NITRITE NEGATIVE 07/01/2022 2354   LEUKOCYTESUR TRACE (A) 07/01/2022 2354   Sepsis Labs: Invalid input(s): "PROCALCITONIN", "LACTICIDVEN"  Microbiology: Recent Results (from the past 240 hour(s))  Resp panel by RT-PCR (RSV, Flu A&B, Covid) Nasopharyngeal Swab     Status: None   Collection Time: 07/01/22  7:36 PM   Specimen: Nasopharyngeal Swab; Nasal Swab  Result Value Ref Range Status   SARS Coronavirus 2 by RT PCR NEGATIVE NEGATIVE Final    Comment: (NOTE) SARS-CoV-2 target nucleic acids are NOT DETECTED.  The SARS-CoV-2 RNA is generally detectable in upper respiratory specimens during the acute phase of infection. The lowest concentration of SARS-CoV-2 viral copies this assay can detect is 138 copies/mL. A negative result does not preclude SARS-Cov-2 infection and should not be used as the sole basis for treatment or other patient management decisions. A negative result may occur with  improper specimen collection/handling, submission of specimen other than nasopharyngeal swab, presence of viral mutation(s) within the areas targeted by this assay, and inadequate number of viral copies(<138 copies/mL).  A negative result must be combined with clinical observations, patient history, and epidemiological information. The expected result is Negative.  Fact Sheet for Patients:  BloggerCourse.com  Fact Sheet for Healthcare Providers:  SeriousBroker.it  This test is no t yet approved or cleared by the [place] FDA and  has been authorized for detection and/or diagnosis of SARS-CoV-2 by FDA under an Emergency Use Authorization (EUA). This EUA will remain  in effect (meaning this test can be used) for the duration of the COVID-19 declaration under Section 564(b)(1) of the Act, 21 U.S.C.section 360bbb-3(b)(1), unless the authorization is terminated  or revoked sooner.       Influenza A by PCR NEGATIVE NEGATIVE Final   Influenza B by PCR NEGATIVE NEGATIVE Final    Comment: (NOTE) The Xpert Xpress SARS-CoV-2/FLU/RSV plus assay is intended as an aid in the diagnosis of influenza from Nasopharyngeal swab specimens and should not be used as a sole basis for treatment. Nasal washings and aspirates are unacceptable for Xpert Xpress SARS-CoV-2/FLU/RSV testing.  Fact Sheet for Patients: EntrepreneurPulse.com.au  Fact Sheet for Healthcare Providers: IncredibleEmployment.be  This test is not yet approved or cleared by the [place] FDA and has been authorized for detection and/or diagnosis of SARS-CoV-2 by FDA under an Emergency Use Authorization (EUA). This EUA will remain in effect (meaning this test can be used) for the duration of the COVID-19 declaration under Section 564(b)(1) of the Act, 21 U.S.C. section 360bbb-3(b)(1), unless the authorization is terminated or revoked.     Resp Syncytial Virus by PCR NEGATIVE NEGATIVE Final    Comment: (NOTE) Fact Sheet for Patients: EntrepreneurPulse.com.au  Fact Sheet for Healthcare  Providers: IncredibleEmployment.be  This test is not yet approved or cleared by the [place] FDA and has been authorized for detection and/or diagnosis of SARS-CoV-2 by FDA under an Emergency Use Authorization (EUA). This EUA will remain in effect (meaning this test can be used) for the duration of the COVID-19 declaration under Section 564(b)(1) of the Act, 21 U.S.C. section 360bbb-3(b)(1), unless the authorization is terminated or revoked.  Performed at [facility], [address] [place], [place] [address]   Blood culture (routine x 2)     Status: None (Preliminary result)   Collection Time: 07/01/22  7:36 PM   Specimen: BLOOD  Result Value Ref Range Status   Specimen Description BLOOD BLOOD RIGHT ARM  Final   Special Requests   Final    BOTTLES DRAWN AEROBIC AND ANAEROBIC Blood Culture adequate volume   Culture   Final    NO GROWTH 2 DAYS Performed at [facility], [address], [place], [place] [address]    Report Status PENDING  Incomplete  Blood culture (routine x 2)     Status: None (Preliminary result)   Collection Time: 07/01/22  7:36 PM   Specimen: BLOOD  Result Value Ref Range Status   Specimen Description BLOOD BLOOD RIGHT HAND  Final   Special Requests   Final    BOTTLES DRAWN AEROBIC AND ANAEROBIC Blood Culture adequate volume   Culture   Final    NO GROWTH 2 DAYS Performed at [facility], [address] [place], [place] [address]    Report Status PENDING  Incomplete  Urine Culture     Status: None   Collection Time: 07/01/22 11:54 PM   Specimen: Urine, Catheterized  Result Value Ref Range Status   Specimen Description   Final    URINE, CATHETERIZED Performed at [facility], [address], [place], [place] [address]    Special Requests   Final    Normal Performed at [facility], [address] [place], [place] [address]    Culture   Final    NO GROWTH Performed at  [facility], [place] [address] [place], [place] [address]    Report Status 07/03/2022 FINAL  Final  MRSA Next Gen by PCR, Nasal     Status: None   Collection Time: 07/02/22  8:00 PM   Specimen: Nasal Mucosa; Nasal Swab  Result Value Ref Range Status   MRSA by PCR Next Gen NOT DETECTED NOT DETECTED Final    Comment: (NOTE) The GeneXpert MRSA Assay (FDA approved for NASAL specimens only), is one component of a comprehensive MRSA colonization surveillance program. It is not intended to diagnose MRSA infection nor to guide or monitor treatment for MRSA infections. Test performance is not FDA approved in patients less than 67 years old. Performed at [facility], [place],  [place], [place] [address]     Radiology Studies: No results found.  CRITICAL CARE Performed by: [facility]    If 7PM-7AM, please contact night-coverage [email or web address] 07/03/2022, 2:52 PM

## 2022-07-03 NOTE — Progress Notes (Signed)
Arrived to 1A from ICU, placed on cardiac monitor. Discussed with patient her need to complete CHG bath from earlier, patient is now agreeable to complete CHG bath cloths.  Will pass along to 7P RN to assist/complete with patient.

## 2022-07-03 NOTE — Progress Notes (Signed)
CRITICAL CARE     Name: EDYTHE RICHES MRN: [id number] DOB: 1961-06-19     LOS: 2   SUBJECTIVE FINDINGS & SIGNIFICANT EVENTS    History of presenting illness:  This is a 62 year old female with a history of anxiety and depression, essential hypertension, GERD, dyslipidemia COPD with active tobacco abuse who came into the ED with altered mental status and diarrhea.  Was found to be lethargic and with severe hyponatremia with a sodium of 104.  Was also hypertensive on admission, also noted to have transaminitis with a low albumin leukocytosis.  Patient had a negative drug screen on admission and negative for COVID RSV and influenza.   07/03/22- patient seems clinically improved. She has been speaking more today.  Pharmacy , nephrology following appreciate input. CT abd/pelvis no signs of neoplasm in chest abdomen or pelvis. Na slightly overcorrected overnight with trend down today which is good.  Overall improvement noted.   Lines/tubes : Urethral Catheter Katheran James RN Double-lumen 16 Fr. (Active)  Indication for Insertion or Continuance of Catheter Acute urinary retention (I&O Cath for 24 hrs prior to catheter insertion- Inpatient Only) 07/01/22 2352  Site Assessment Clean, Dry, Intact 07/01/22 2352  Catheter Maintenance Bag below level of bladder;Catheter secured;Drainage bag/tubing not touching floor;Insertion date on drainage bag;No dependent loops;Seal intact 07/01/22 2352  Collection Container Standard drainage bag 07/01/22 2352  Securement Method Securing device (Describe) 07/01/22 2352    Microbiology/Sepsis markers: Results for orders placed or performed during the hospital encounter of 07/01/22  Resp panel by RT-PCR (RSV, Flu A&B, Covid) Nasopharyngeal Swab     Status: None   Collection Time:  07/01/22  7:36 PM   Specimen: Nasopharyngeal Swab; Nasal Swab  Result Value Ref Range Status   SARS Coronavirus 2 by RT PCR NEGATIVE NEGATIVE Final    Comment: (NOTE) SARS-CoV-2 target nucleic acids are NOT DETECTED.  The SARS-CoV-2 RNA is generally detectable in upper respiratory specimens during the acute phase of infection. The lowest concentration of SARS-CoV-2 viral copies this assay can detect is 138 copies/mL. A negative result does not preclude SARS-Cov-2 infection and should not be used as the sole basis for treatment or other patient management decisions. A negative result may occur with  improper specimen collection/handling, submission of specimen other than nasopharyngeal swab, presence of viral mutation(s) within the areas targeted by this assay, and inadequate number of viral copies(<138 copies/mL). A negative result must be combined with clinical observations, patient history, and epidemiological information. The expected result is Negative.  Fact Sheet for Patients:  BloggerCourse.com  Fact Sheet for Healthcare Providers:  SeriousBroker.it  This test is no t yet approved or cleared by the [place] FDA and  has been authorized for detection and/or diagnosis of SARS-CoV-2 by FDA under an Emergency Use Authorization (EUA). This EUA will remain  in effect (meaning this test can be used) for the duration of the COVID-19 declaration under Section 564(b)(1) of the Act, 21 U.S.C.section 360bbb-3(b)(1), unless the authorization is terminated  or revoked sooner.       Influenza A by PCR NEGATIVE NEGATIVE Final   Influenza B by PCR NEGATIVE NEGATIVE Final    Comment: (NOTE) The Xpert Xpress SARS-CoV-2/FLU/RSV plus assay is intended as an aid in the diagnosis of influenza from Nasopharyngeal swab specimens and should not be used as a sole basis for treatment. Nasal washings and aspirates are unacceptable for Xpert  Xpress SARS-CoV-2/FLU/RSV testing.  Fact Sheet for Patients: BloggerCourse.com  Fact Sheet for Healthcare  Providers: SeriousBroker.ithttps://www.fda.gov/media/152162/download  This test is not yet approved or cleared by the [place] FDA and has been authorized for detection and/or diagnosis of SARS-CoV-2 by FDA under an Emergency Use Authorization (EUA). This EUA will remain in effect (meaning this test can be used) for the duration of the COVID-19 declaration under Section 564(b)(1) of the Act, 21 U.S.C. section 360bbb-3(b)(1), unless the authorization is terminated or revoked.     Resp Syncytial Virus by PCR NEGATIVE NEGATIVE Final    Comment: (NOTE) Fact Sheet for Patients: BloggerCourse.comhttps://www.fda.gov/media/152166/download  Fact Sheet for Healthcare Providers: SeriousBroker.ithttps://www.fda.gov/media/152162/download  This test is not yet approved or cleared by the [place] FDA and has been authorized for detection and/or diagnosis of SARS-CoV-2 by FDA under an Emergency Use Authorization (EUA). This EUA will remain in effect (meaning this test can be used) for the duration of the COVID-19 declaration under Section 564(b)(1) of the Act, 21 U.S.C. section 360bbb-3(b)(1), unless the authorization is terminated or revoked.  Performed at [facility], [address] [place], [place] [address]   Blood culture (routine x 2)     Status: None (Preliminary result)   Collection Time: 07/01/22  7:36 PM   Specimen: BLOOD  Result Value Ref Range Status   Specimen Description BLOOD BLOOD RIGHT ARM  Final   Special Requests   Final    BOTTLES DRAWN AEROBIC AND ANAEROBIC Blood Culture adequate volume   Culture   Final    NO GROWTH 2 DAYS Performed at [facility], [address] [place], [place] [address]    Report Status PENDING  Incomplete  Blood culture (routine x 2)     Status: None (Preliminary result)   Collection Time: 07/01/22  7:36 PM   Specimen: BLOOD   Result Value Ref Range Status   Specimen Description BLOOD BLOOD RIGHT HAND  Final   Special Requests   Final    BOTTLES DRAWN AEROBIC AND ANAEROBIC Blood Culture adequate volume   Culture   Final    NO GROWTH 2 DAYS Performed at [facility], [address] [place], [place] [address]    Report Status PENDING  Incomplete  Urine Culture     Status: None   Collection Time: 07/01/22 11:54 PM   Specimen: Urine, Catheterized  Result Value Ref Range Status   Specimen Description   Final    URINE, CATHETERIZED Performed at [facility], [address] [place], [place] [address]    Special Requests   Final    Normal Performed at [facility], [address] [place], [place] [address]    Culture   Final    NO GROWTH Performed at [facility], [address] [place], [place] [address]    Report Status 07/03/2022 FINAL  Final  MRSA Next Gen by PCR, Nasal     Status: None   Collection Time: 07/02/22  8:00 PM   Specimen: Nasal Mucosa; Nasal Swab  Result Value Ref Range Status   MRSA by PCR Next Gen NOT DETECTED NOT DETECTED Final    Comment: (NOTE) The GeneXpert MRSA Assay (FDA approved for NASAL specimens only), is one component of a comprehensive MRSA colonization surveillance program. It is not intended to diagnose MRSA infection nor to guide or monitor treatment for MRSA infections. Test performance is not FDA approved in patients less than 63 years old. Performed at [facility], [address] [place], [place] [address]     Anti-infectives:  Anti-infectives (From  admission, onward)    Start     Dose/Rate Route Frequency Ordered Stop   07/03/22 1800  vancomycin (VANCOREADY) IVPB 1750 mg/350 mL  Status:  Discontinued        1,750 mg 175 mL/hr over 120 Minutes Intravenous Every 24 hours 07/02/22 1551 07/03/22 1048   07/03/22 1800  vancomycin (VANCOREADY) IVPB 1500 mg/300 mL  Status:  Discontinued        1,500 mg 150 mL/hr  over 120 Minutes Intravenous Every 24 hours 07/03/22 1048 07/03/22 1133   07/03/22 0000  vancomycin (VANCOREADY) IVPB 1750 mg/350 mL  Status:  Discontinued        1,750 mg 175 mL/hr over 120 Minutes Intravenous Every 24 hours 07/02/22 1549 07/02/22 1551   07/02/22 1600  vancomycin (VANCOREADY) IVPB 1500 mg/300 mL        1,500 mg 150 mL/hr over 120 Minutes Intravenous  Once 07/02/22 1549 07/02/22 1953   07/02/22 1600  piperacillin-tazobactam (ZOSYN) IVPB 3.375 g        3.375 g 12.5 mL/hr over 240 Minutes Intravenous Every 8 hours 07/02/22 1549     07/01/22 2156  Uribel 118 MG CAPS 118 mg  Status:  Discontinued        1 capsule Oral Every 6 hours PRN 07/01/22 2158 07/01/22 2201        PAST MEDICAL HISTORY   Past Medical History:  Diagnosis Date   Anxiety    COPD (chronic obstructive pulmonary disease) (HCC)    Depressive disorder    GERD (gastroesophageal reflux disease)    Hiatal hernia    HLD (hyperlipidemia)    Hypertension    IBS (irritable bowel syndrome)    IC (interstitial cystitis)    Migraine    Nicotine dependence    Nontoxic goiter    Overactive bladder    Smoking      SURGICAL HISTORY   Past Surgical History:  Procedure Laterality Date   ABDOMINAL HYSTERECTOMY     ADENOIDECTOMY     CHOLECYSTECTOMY     HERNIA REPAIR     NECK SURGERY     SHOULDER SURGERY     TONSILLECTOMY       FAMILY HISTORY   Family History  Problem Relation Age of Onset   CAD Father        onset age 46   Heart disease Father    Kidney disease Father    Prostate cancer Father    Colon polyps Maternal Grandmother    Colon cancer Paternal Grandmother    Bladder Cancer Neg Hx    Kidney cancer Neg Hx      SOCIAL HISTORY   Social History   Tobacco Use   Smoking status: Every Day    Packs/day: 0.50    Types: Cigarettes   Smokeless tobacco: Never  Substance Use Topics   Alcohol use: No   Drug use: No     MEDICATIONS   Current Medication:  Current  Facility-Administered Medications:    0.45 % sodium chloride infusion, , Intravenous, Continuous, Gonfa, Taye T, MD, Last Rate: 100 mL/hr at 07/03/22 1000, Infusion Verify at 07/03/22 1000   acetaminophen (TYLENOL) tablet 650 mg, 650 mg, Oral, Q6H PRN, 650 mg at 07/03/22 1109 **OR** acetaminophen (TYLENOL) suppository 650 mg, 650 mg, Rectal, Q6H PRN, Mansy, Jan A, MD   atorvastatin (LIPITOR) tablet 40 mg, 40 mg, Oral, Daily, Mansy, Jan A, MD, 40 mg at 07/03/22 0913   benzonatate (TESSALON) capsule 100 mg, 100 mg, Oral, BID  PRN, Mansy, Jan A, MD, 100 mg at 07/03/22 0006   budesonide (PULMICORT) nebulizer solution 0.5 mg, 0.5 mg, Nebulization, BID, Selinda Eon, RPH, 0.5 mg at 07/03/22 3474   butalbital-acetaminophen-caffeine (FIORICET) 50-325-40 MG per tablet 1 tablet, 1 tablet, Oral, Daily PRN, Mansy, Jan A, MD   calcium gluconate 1 g/ 50 mL sodium chloride IVPB, 1 g, Intravenous, Once, Mila Merry A, RPH, Last Rate: 50 mL/hr at 07/03/22 1351, 1,000 mg at 07/03/22 1351   Chlorhexidine Gluconate Cloth 2 % PADS 6 each, 6 each, Topical, Daily, Mansy, Jan A, MD, 6 each at 07/02/22 2002   dicyclomine (BENTYL) capsule 10 mg, 10 mg, Oral, TID PRN, Mansy, Jan A, MD, 10 mg at 07/01/22 2227   enoxaparin (LOVENOX) injection 40 mg, 40 mg, Subcutaneous, Q24H, Mansy, Jan A, MD, 40 mg at 07/02/22 2216   FLUoxetine (PROZAC) capsule 10 mg, 10 mg, Oral, Daily, Mansy, Jan A, MD, 10 mg at 07/03/22 1102   gabapentin (NEURONTIN) capsule 300 mg, 300 mg, Oral, TID PRN, Mansy, Jan A, MD   guaiFENesin (MUCINEX) 12 hr tablet 600 mg, 600 mg, Oral, BID, Gonfa, Taye T, MD   guaiFENesin-dextromethorphan (ROBITUSSIN DM) 100-10 MG/5ML syrup 5 mL, 5 mL, Oral, Q4H PRN, Alanda Slim, Taye T, MD   magic mouthwash w/lidocaine, 5 mL, Oral, QID, Gonfa, Taye T, MD, 5 mL at 07/03/22 1102   magnesium hydroxide (MILK OF MAGNESIA) suspension 30 mL, 30 mL, Oral, Daily PRN, Mansy, Jan A, MD, 30 mL at 07/01/22 2228   metoprolol succinate  (TOPROL-XL) 24 hr tablet 25 mg, 25 mg, Oral, Daily, Gonfa, Taye T, MD, 25 mg at 07/03/22 0912   norepinephrine (LEVOPHED) 4mg  in (0.016 mg/mL) premix infusion, 0-40 mcg/min, Intravenous, Continuous, Siadecki, , MD, Stopped at 07/03/22 0940   ondansetron (ZOFRAN) tablet 4 mg, 4 mg, Oral, Q6H PRN, 4 mg at 07/03/22 0357 **OR** ondansetron (ZOFRAN) injection 4 mg, 4 mg, Intravenous, Q6H PRN, Mansy, Jan A, MD, 4 mg at 07/01/22 2228   pantoprazole (PROTONIX) EC tablet 40 mg, 40 mg, Oral, Daily, Mansy, Jan A, MD, 40 mg at 07/03/22 0913   piperacillin-tazobactam (ZOSYN) IVPB 3.375 g, 3.375 g, Intravenous, Q8H, 07/05/22, RPH, Last Rate: 12.5 mL/hr at 07/03/22 1000, Infusion Verify at 07/03/22 1000   polyethylene glycol (MIRALAX / GLYCOLAX) packet 17 g, 17 g, Oral, Daily, Mansy, Jan A, MD, 17 g at 07/02/22 07/04/22   QUEtiapine (SEROQUEL) tablet 25 mg, 25 mg, Oral, BID, 2595, Taye T, MD, 25 mg at 07/03/22 0913   senna-docusate (Senokot-S) tablet 2 tablet, 2 tablet, Oral, BID, Mansy, Jan A, MD, 2 tablet at 07/03/22 0913   sodium chloride 0.9 % bolus 1,000 mL, 1,000 mL, Intravenous, Once, 07/05/22, MD, Held at 07/02/22 1200   sodium chloride tablet 1 g, 1 g, Oral, BID WC, Mansy, Jan A, MD, 1 g at 07/03/22 0912   traZODone (DESYREL) tablet 25 mg, 25 mg, Oral, QHS PRN, Mansy, Jan A, MD, 25 mg at 07/03/22 0006   Vitamin D (Ergocalciferol) (DRISDOL) 1.25 MG (50000 UNIT) capsule 50,000 Units, 50,000 Units, Oral, Weekly, Mansy, Jan A, MD, 50,000 Units at 07/02/22 0033    ALLERGIES   Ciprofloxacin, Macrobid [nitrofurantoin monohyd macro], and Sulfa antibiotics    REVIEW OF SYSTEMS     Unable to obtain review of systems due to encephalopathy with lethargy  PHYSICAL EXAMINATION   Vital Signs: Temp:  [98 F (36.7 C)-99.4 F (37.4 C)] 98 F (36.7 C) (01/14 0800) Pulse Rate:  [  65-102] 88 (01/14 1100) Resp:  [13-27] 19 (01/14 1100) BP: (78-157)/(45-129) 110/94 (01/14  1100) SpO2:  [81 %-100 %] 97 % (01/14 1100)  GENERAL:Age approprate NAD  HEAD: Normocephalic, atraumatic.  EYES: Pupils equal, round, reactive to light.  No scleral icterus.  MOUTH: Moist mucosal membrane. NECK: Supple. No thyromegaly. No nodules. No JVD.  PULMONARY: clear to auscultation bilaterally  CARDIOVASCULAR: S1 and S2. Regular rate and rhythm. No murmurs, rubs, or gallops.  GASTROINTESTINAL: Soft, nontender, non-distended. No masses. Positive bowel sounds. No hepatosplenomegaly.  MUSCULOSKELETAL: No swelling, clubbing, or edema.  NEUROLOGIC: encephalopathy GCS 10 SKIN:intact,warm,dry   PERTINENT DATA     Infusions:  sodium chloride 100 mL/hr at 07/03/22 1000   calcium gluconate 1,000 mg (07/03/22 1351)   norepinephrine (LEVOPHED) Adult infusion Stopped (07/03/22 0940)   piperacillin-tazobactam (ZOSYN)  IV 12.5 mL/hr at 07/03/22 1000   sodium chloride Stopped (07/02/22 1200)   Scheduled Medications:  atorvastatin  40 mg Oral Daily   budesonide (PULMICORT) nebulizer solution  0.5 mg Nebulization BID   Chlorhexidine Gluconate Cloth  6 each Topical Daily   enoxaparin (LOVENOX) injection  40 mg Subcutaneous Q24H   FLUoxetine  10 mg Oral Daily   guaiFENesin  600 mg Oral BID   magic mouthwash w/lidocaine  5 mL Oral QID   metoprolol succinate  25 mg Oral Daily   pantoprazole  40 mg Oral Daily   polyethylene glycol  17 g Oral Daily   QUEtiapine  25 mg Oral BID   senna-docusate  2 tablet Oral BID   sodium chloride  1 g Oral BID WC   Vitamin D (Ergocalciferol)  50,000 Units Oral Weekly   PRN Medications: acetaminophen **OR** acetaminophen, benzonatate, butalbital-acetaminophen-caffeine, dicyclomine, gabapentin, guaiFENesin-dextromethorphan, magnesium hydroxide, ondansetron **OR** ondansetron (ZOFRAN) IV, traZODone Hemodynamic parameters:   Intake/Output: 01/13 0701 - 01/14 0700 In: 2920.6 [P.O.:400; I.V.:1900.1; IV Piggyback:620.5] Out: 3235 [Urine:3375]  Ventilator   Settings:      LAB RESULTS:  Basic Metabolic Panel: Recent Labs  Lab 07/01/22 1859 07/01/22 2238 07/02/22 0139 07/02/22 0438 07/02/22 1116 07/02/22 1118 07/02/22 1317 07/02/22 1413 07/02/22 2114 07/03/22 0313 07/03/22 0319 07/03/22 0853  NA 104*   < >  --    < >  --    < > 107* 114* 128*  --  129* 127*  K 2.2*   < >  --    < >  --    < > 4.2 4.7 5.0  --  4.6 4.4  CL 66*   < >  --    < >  --    < > 84* 87* 98  --  98 98  CO2 26   < >  --    < >  --    < > 20* 21* 24  --  24 25  GLUCOSE 136*   < >  --    < >  --    < > 85 99 96  --  120* 103*  BUN 8   < >  --    < >  --    < > <5* <5* <5*  --  5* <5*  CREATININE 0.50   < >  --    < >  --    < > 0.47 0.46 0.54  --  0.60 0.55  CALCIUM 7.1*   < >  --    < >  --    < > 5.5* 6.6* 7.7*  --  7.7* 7.4*  MG 1.4*  --  3.1*  --   --   --   --   --   --  2.1  --   --   PHOS  --   --   --   --  1.8*  --  1.5*  --  2.9  --  4.0 3.3   < > = values in this interval not displayed.    Liver Function Tests: Recent Labs  Lab 07/01/22 1859 07/02/22 0139 07/02/22 1118 07/02/22 1317 07/02/22 2114 07/03/22 0319 07/03/22 0853  AST 42*  --  25  --   --   --   --   ALT 53*  --  37  --   --   --   --   ALKPHOS 54  --  44  --   --   --   --   BILITOT 0.9  --  0.6  --   --   --   --   PROT 6.0*  --  4.6*  --   --   --   --   ALBUMIN 3.1*   < > 2.3* 1.9* 2.7* 2.7* 2.6*   < > = values in this interval not displayed.    Recent Labs  Lab 07/01/22 1859  LIPASE 28    Recent Labs  Lab 07/02/22 1115  AMMONIA 18    CBC: Recent Labs  Lab 07/01/22 1859 07/02/22 0438  WBC 17.6* 11.1*  NEUTROABS 13.8*  --   HGB 11.9* 11.0*  HCT 32.7* 30.1*  MCV 82.6 82.9  PLT 271 223    Cardiac Enzymes: No results for input(s): "CKTOTAL", "CKMB", "CKMBINDEX", "TROPONINI" in the last 168 hours. BNP: Invalid input(s): "POCBNP" CBG: Recent Labs  Lab 07/02/22 1444 07/02/22 1955  GLUCAP 104* 117*       IMAGING RESULTS:  Imaging: CT HEAD  WO CONTRAST ( )  Result Date: 07/02/2022 CLINICAL DATA:  Mental status change, unknown cause EXAM: CT HEAD WITHOUT CONTRAST TECHNIQUE: Contiguous axial images were obtained from the base of the skull through the vertex without intravenous contrast. RADIATION DOSE REDUCTION: This exam was performed according to the departmental dose-optimization program which includes automated exposure control, adjustment of the mA and/or kV according to patient size and/or use of iterative reconstruction technique. COMPARISON:  07/01/2022. FINDINGS: Brain: No evidence of acute infarction, hemorrhage, hydrocephalus, extra-axial collection or mass lesion/mass effect. Vascular: No hyperdense vessel or unexpected calcification. Skull: Normal. Negative for fracture or focal lesion. Sinuses/Orbits: No acute finding. IMPRESSION: No acute intracranial process. Electronically Signed   By: Layla Maw M.D.   On: 07/02/2022 11:51   CT HEAD WO CONTRAST ( )  Result Date: 07/01/2022 CLINICAL DATA:  Recent syncopal episode and headaches, initial encounter EXAM: CT HEAD WITHOUT CONTRAST TECHNIQUE: Contiguous axial images were obtained from the base of the skull through the vertex without intravenous contrast. RADIATION DOSE REDUCTION: This exam was performed according to the departmental dose-optimization program which includes automated exposure control, adjustment of the mA and/or kV according to patient size and/or use of iterative reconstruction technique. COMPARISON:  06/21/2022 FINDINGS: Brain: Examination is somewhat limited by patient motion artifact. No findings to suggest acute hemorrhage, acute infarction or space-occupying mass lesion are noted. Vascular: No hyperdense vessel or unexpected calcification. Skull: Normal. Negative for fracture or focal lesion. Sinuses/Orbits: No acute finding. Other: None. IMPRESSION: Somewhat limited exam due to patient motion artifact. No acute abnormality is noted. Electronically Signed    By: Eulah Pont.D.  On: 07/01/2022 20:50   CT ABDOMEN PELVIS W CONTRAST  Result Date: 07/01/2022 CLINICAL DATA:  Acute abdominal pain and vomiting, initial encounter EXAM: CT ABDOMEN AND PELVIS WITH CONTRAST TECHNIQUE: Multidetector CT imaging of the abdomen and pelvis was performed using the standard protocol following bolus administration of intravenous contrast. RADIATION DOSE REDUCTION: This exam was performed according to the departmental dose-optimization program which includes automated exposure control, adjustment of the mA and/or kV according to patient size and/or use of iterative reconstruction technique. CONTRAST:  190mL OMNIPAQUE IOHEXOL 300 MG/ML  SOLN COMPARISON:  06/19/2022 FINDINGS: Lower chest: Lung bases demonstrate diffuse emphysematous changes. Small pericardial effusion is noted new from the prior exam. Fluid is noted in the distal esophagus likely related to combination of hiatal hernia and reflux. Hepatobiliary: No focal liver abnormality is seen. Status post cholecystectomy. No biliary dilatation. Pancreas: Unremarkable. No pancreatic ductal dilatation or surrounding inflammatory changes. Spleen: Normal in size without focal abnormality. Adrenals/Urinary Tract: Adrenal glands are within normal limits. Kidneys demonstrate a normal enhancement pattern bilaterally. Mild fullness of the collecting systems and ureters is seen bilaterally likely related to a distended bladder as no mass or stone is seen. The bladder is well distended. A Foley catheter is noted in place however. Stomach/Bowel: No obstructive or inflammatory changes of colon are seen. Appendix is within normal limits. Small bowel is within normal limits. Stomach is distended with fluid. Vascular/Lymphatic: Aortic atherosclerosis. No enlarged abdominal or pelvic lymph nodes. Reproductive: Status post hysterectomy. No adnexal masses. Other: No abdominal wall hernia or abnormality. No abdominopelvic ascites. Musculoskeletal:  Degenerative changes of lumbar spine are noted. Mild anterolisthesis of L4 on L5 is noted. IMPRESSION: Distention of the bladder despite a Foley catheter in place. This results in fullness of the collecting systems as well. New small pericardial effusion. Small hiatal hernia and reflux. Electronically Signed   By: Inez Catalina M.D.   On: 07/01/2022 20:47   DG Chest Portable 1 View  Result Date: 07/01/2022 CLINICAL DATA:  Vomiting and syncopal episode, initial encounter EXAM: PORTABLE CHEST 1 VIEW COMPARISON:  06/21/2022 FINDINGS: The heart size and mediastinal contours are within normal limits. Both lungs are clear. The visualized skeletal structures are unremarkable. IMPRESSION: No active disease. Electronically Signed   By: Inez Catalina M.D.   On: 07/01/2022 19:06   @PROBHOSP @ No results found.   ASSESSMENT AND PLAN    -Multidisciplinary rounds held today  Altered mental status with encephalopathy -Present on admission likely due to severe hyponatremia, investigate underlying etiology and treat culprit of severe hyponatremia.  RN reporting that mentation has improved after initiation of vasopressor support.   -Additionally patient will have microbiology with blood cultures, urine culture and empiric therapy for possible septic encephalopathy. -Metabolic encephalopathy is on the differential due to severe electrolyte derangements aside from sodium including potassium, phosphorus, magnesium, calcium.   Hypoosmolar hypovolemic hyponatremia   - possible drug related - patient on fluoxetine, esomeprazole  and takes NSAIDS  - unclear EtOH history - patient does have transaminitis and this may be due to potomania   - urine osm is elevated will obtain urine Na.   -TSH and cortisol are normal    Circulatory shock -unclear etiology at this moment , she is receiving IVF  - this may be related to sepsis in lieu of elevated wbc count and we have ordered septic workup.  PRESENT ON ADMISSION -follow  UO -continue Foley Catheter-assess need daily -On levophed currently  -empirically on vancomycin and zosyn -MRSA PCR  ID -continue IV abx as prescibed -follow up cultures  GI/Nutrition GI PROPHYLAXIS as indicated DIET-->TF's as tolerated Constipation protocol as indicated  ENDO - ICU hypoglycemic\Hyperglycemia protocol -check FSBS per protocol   ELECTROLYTES -follow labs as needed -replace as needed -pharmacy consultation   DVT/GI PRX ordered -SCDs  TRANSFUSIONS AS NEEDED MONITOR FSBS ASSESS the need for LABS as needed   Critical care provider statement:   Total critical care time: 33 minutes   Performed by: Karna ChristmasAleskerov MD   Critical care time was exclusive of separately billable procedures and treating other patients.   Critical care was necessary to treat or prevent imminent or life-threatening deterioration.   Critical care was time spent personally by me on the following activities: development of treatment plan with patient and/or surrogate as well as nursing, discussions with consultants, evaluation of patient's response to treatment, examination of patient, obtaining history from patient or surrogate, ordering and performing treatments and interventions, ordering and review of laboratory studies, ordering and review of radiographic studies, pulse oximetry and re-evaluation of patient's condition.    Vida RiggerFuad Akon Reinoso, M.D.  Pulmonary & Critical Care Medicine

## 2022-07-03 NOTE — Plan of Care (Signed)

## 2022-07-04 DIAGNOSIS — K529 Noninfective gastroenteritis and colitis, unspecified: Secondary | ICD-10-CM | POA: Diagnosis not present

## 2022-07-04 DIAGNOSIS — R7989 Other specified abnormal findings of blood chemistry: Secondary | ICD-10-CM

## 2022-07-04 DIAGNOSIS — E876 Hypokalemia: Secondary | ICD-10-CM | POA: Diagnosis not present

## 2022-07-04 DIAGNOSIS — R531 Weakness: Secondary | ICD-10-CM

## 2022-07-04 DIAGNOSIS — E871 Hypo-osmolality and hyponatremia: Secondary | ICD-10-CM | POA: Diagnosis not present

## 2022-07-04 DIAGNOSIS — R0989 Other specified symptoms and signs involving the circulatory and respiratory systems: Secondary | ICD-10-CM

## 2022-07-04 HISTORY — DX: Other specified abnormal findings of blood chemistry: R79.89

## 2022-07-04 LAB — RENAL FUNCTION PANEL
Albumin: 2.5 g/dL — ABNORMAL LOW (ref 3.5–5.0)
Anion gap: 6 (ref 5–15)
BUN: 6 mg/dL — ABNORMAL LOW (ref 8–23)
CO2: 26 mmol/L (ref 22–32)
Calcium: 7.8 mg/dL — ABNORMAL LOW (ref 8.9–10.3)
Chloride: 97 mmol/L — ABNORMAL LOW (ref 98–111)
Creatinine, Ser: 0.73 mg/dL (ref 0.44–1.00)
GFR, Estimated: >60 mL/min (ref >60)
Glucose, Bld: 100 mg/dL — ABNORMAL HIGH (ref 70–99)
Phosphorus: 3.5 mg/dL (ref 2.5–4.6)
Potassium: 4 mmol/L (ref 3.5–5.1)
Sodium: 129 mmol/L — ABNORMAL LOW (ref 135–145)

## 2022-07-04 MED ORDER — METOPROLOL SUCCINATE ER 50 MG PO TB24
50.0000 mg | ORAL_TABLET | Freq: Every day | ORAL | Status: DC
  Administered 2022-07-04: 50 mg via ORAL
  Filled 2022-07-04: qty 1

## 2022-07-04 MED ORDER — QUETIAPINE FUMARATE 25 MG PO TABS
50.0000 mg | ORAL_TABLET | Freq: Two times a day (BID) | ORAL | Status: DC
  Administered 2022-07-04 – 2022-07-05 (×2): 50 mg via ORAL
  Filled 2022-07-04 (×2): qty 2

## 2022-07-04 MED ORDER — SODIUM CHLORIDE 0.9 % IV SOLN: INTRAVENOUS | Status: DC

## 2022-07-04 NOTE — Evaluation (Signed)
Physical Therapy Evaluation Patient Details Name: Katelyn [name] MRN: [id number] DOB: [birth year] Today's Date: 07/04/2022  History of Present Illness  62 y/o female presented to ED on 07/01/22 after vomitting and going unresponsive x 5 mins. Admitted for hyponatremia. Recent admission 1/1-06/24/22 for acute encephalopathy. PMH: anxiety, depression, HTN, tobacco abuse, COPD  Clinical Impression  Patient admitted with the above. PTA, patient lives with son but is independent with mobility and ADLs. Son drives patient to appointments and does grocery shopping/cooking/cleaning. Patient presents with weakness, impaired balance, and decreased activity tolerance. Flat affect throughout session and demonstrates slow processing. Required supervision for bed mobility and min guard for sit to stand transfer. Reporting dizziness upon standing and declining ambulation but agreeable to transfer to recliner. Required min guard for step pivot transfer due to unsteadiness. Encouraged use of RW for mobility for stability and to prevent falls, patient verbalized understanding. Patient will benefit from skilled PT services during acute stay to address listed deficits. Recommend HHPT at discharge to maximize functional mobility and safety to reduce fall risk.        Recommendations for follow up therapy are one component of a multi-disciplinary discharge planning process, led by the attending physician.  Recommendations may be updated based on patient status, additional functional criteria and insurance authorization.  Follow Up Recommendations Home health PT      Assistance Recommended at Discharge Intermittent Supervision/Assistance  Patient can return home with the following  A little help with walking and/or transfers;Assistance with cooking/housework;Assist for transportation;Help with stairs or ramp for entrance;A little help with bathing/dressing/bathroom;Direct supervision/assist for medications management;Direct  supervision/assist for financial management    Equipment Recommendations Rolling Katelyn [name] (2 wheels)  Recommendations for Other Services       Functional Status Assessment Patient has had a recent decline in their functional status and demonstrates the ability to make significant improvements in function in a reasonable and predictable amount of time.     Precautions / Restrictions Precautions Precautions: Fall Restrictions Weight Bearing Restrictions: No      Mobility  Bed Mobility Overal bed mobility: Needs Assistance Bed Mobility: Supine to Sit     Supine to sit: Supervision, HOB elevated          Transfers Overall transfer level: Needs assistance Equipment used: None Transfers: Sit to/from Stand, Bed to chair/wheelchair/BSC Sit to Stand: Min guard   Step pivot transfers: Min guard       General transfer comment: min guard for safety. unsteady with stepping towards recliner. Reporting dizziness and declining ambulation this date    Ambulation/Gait                  Stairs            Wheelchair Mobility    Modified Rankin (Stroke Patients Only)       Balance Overall balance assessment: Needs assistance Sitting-balance support: Feet supported Sitting balance-Leahy Scale: Good     Standing balance support: No upper extremity supported, During functional activity Standing balance-Leahy Scale: Poor Standing balance comment: unsteady with step pivot transfer                             Pertinent Vitals/Pain Pain Assessment Pain Assessment: Faces Faces Pain Scale: Hurts little more Pain Location: back Pain Descriptors / Indicators: Aching, Discomfort Pain Intervention(s): Monitored during session, Repositioned    Home Living Family/patient expects to be discharged to:: Private residence Living Arrangements: Children Available Help at  Discharge: Other (Comment) (son stays with her but does not provide assistance) Type of  Home: House Home Access: Stairs to enter   [employer] of Steps: 4-5 at side and front entrance, rails on side entrance only   Home Layout: One level Home Equipment: Conservation officer, nature (2 wheels) Additional Comments: thinks she has a RW, going to confirm with son    Prior Function Prior Level of Function : Independent/Modified Independent;History of Falls (last six months)             Mobility Comments: fall x 1 in past 6 months ADLs Comments: reports independence     Hand Dominance        Extremity/Trunk Assessment   Upper Extremity Assessment Upper Extremity Assessment: Defer to OT evaluation    Lower Extremity Assessment Lower Extremity Assessment: Generalized weakness    Cervical / Trunk Assessment Cervical / Trunk Assessment: Normal  Communication   Communication: No difficulties  Cognition Arousal/Alertness: Awake/alert Behavior During Therapy: Flat affect Overall Cognitive Status: No family/caregiver present to determine baseline cognitive functioning Area of Impairment: Problem solving, Safety/judgement                         Safety/Judgement: Decreased awareness of deficits   Problem Solving: Slow processing General Comments: very flat affect. Follows commands appropriatley but seems to required increased time to process. Decreased awareness into overall situation and deficits        General Comments General comments (skin integrity, edema, etc.): VSS on RA    Exercises     Assessment/Plan    PT Assessment Patient needs continued PT services  PT Problem List Decreased strength;Decreased balance;Cardiopulmonary status limiting activity;Decreased mobility;Decreased activity tolerance;Decreased safety awareness       PT Treatment Interventions DME instruction;Functional mobility training;Balance training;Patient/family education;Gait training;Therapeutic activities;Stair training;Therapeutic exercise    PT Goals (Current goals  can be found in the Care Plan section)  Acute Rehab PT Goals Patient Stated Goal: to get better PT Goal Formulation: With patient Time For Goal Achievement: 07/18/22 Potential to Achieve Goals: Good    Frequency Min 2X/week     Co-evaluation               AM-PAC PT "6 Clicks" Mobility  Outcome Measure Help needed turning from your back to your side while in a flat bed without using bedrails?: A Little Help needed moving from lying on your back to sitting on the side of a flat bed without using bedrails?: A Little Help needed moving to and from a bed to a chair (including a wheelchair)?: A Little Help needed standing up from a chair using your arms (e.g., wheelchair or bedside chair)?: A Little Help needed to walk in hospital room?: A Lot Help needed climbing 3-5 steps with a railing? : A Lot 6 Click Score: 16    End of Session   Activity Tolerance: Patient limited by pain Patient left: in chair;with call bell/phone within reach;with chair alarm set Nurse Communication: Mobility status PT Visit Diagnosis: Other abnormalities of gait and mobility (R26.89);Muscle weakness (generalized) (M62.81)    Time: 1013-1030 PT Time Calculation (min) (ACUTE ONLY): 17 min   Charges:   PT Evaluation $PT Eval Low Complexity: 1 Low          Katelyn [name] PT, DPT [facility] - Acute Rehabilitation Services   Katelyn [name] 07/04/2022, 11:31 AM

## 2022-07-04 NOTE — Progress Notes (Addendum)
PROGRESS NOTE  Katelyn [name] ASN:[id number] DOB: [birth year]   PCP: Alease Medina, MD  Patient is from: Home.  DOA: 07/01/2022 LOS: 3  Chief complaints Generalized weakness  Brief Narrative / Interim history: 62 year old F with PMH of COPD, anxiety, depression, GERD, HTN, HLD, chronic pain, hyponatremia and tobacco use disorder presented to ED with nausea, vomiting, diarrhea and abdominal pain for about 5 days and admitted with significant electrolyte derangements. Na 104.  K2.2.  Mg 1.4. Cl 66.  WBC 17.6 with left shift.  EKG with QTc of 527.  CT abdomen and pelvis showed distended bladder despite Foley catheter with fullness of the collecting system.  CXR without acute finding.  Patient was started on IV NS, potassium and magnesium supplementation and admitted for severe hyponatremia and electrolyte derangement.  Patient had acute hypotension and encephalopathy the morning of 07/02/2021.  Encephalopathy workup including CT, ABG, ammonia and infectious workup unrevealing.  Concern about iatrogenic etiology.  She was started on Levophed by PIV.  PCCM consulted as well.  On 1/14, encephalopathy and hypotension resolved.  Hyponatremia improved.  She came off Levophed.   Subjective: Seen and examined earlier this morning.  She states "I do not feel good".  When asked to elaborate, she reports generalized weakness and chest congestion.  She denies focal weakness or numbness.  Reports "some" nausea but no vomiting or diarrhea.  Denies abdominal pain.  Objective: Vitals:   07/03/22 2039 07/03/22 2257 07/04/22 0752 07/04/22 0815  BP:  (!) 149/81  (!) 182/91  Pulse:  90  84  Resp:  18  17  Temp:  98.3 F (36.8 C)  (!) 97.4 F (36.3 C)  TempSrc:      SpO2: 99% 98% 98% 99%  Weight:      Height:        Examination:  GENERAL: No apparent distress.  Nontoxic. HEENT: MMM.  Vision and hearing grossly intact.  NECK: Supple.  No apparent JVD.  RESP:  No IWOB.  Fair aeration bilaterally.   Rhonchi laterally. CVS:  RRR. Heart sounds normal.  ABD/GI/GU: BS+. Abd soft, NTND.  Indwelling Foley. MSK/EXT:  Moves extremities. No apparent deformity. No edema.  SKIN: no apparent skin lesion or wound NEURO: Awake and alert. Oriented appropriately.  No apparent focal neuro deficit. PSYCH: Calm. Normal affect.   Procedures:  None  Microbiology summarized: COVID-19 and influenza PCR nonreactive. Blood cultures NGTD Urine culture NGTD.  Assessment and plan: Principal Problem:   Hyponatremia Active Problems:   Hypokalemia   Hypomagnesemia   Acute gastroenteritis   Essential hypertension   Acute encephalopathy   COPD (chronic obstructive pulmonary disease) (HCC)   Dyslipidemia   Anxiety and depression   GERD without esophagitis   Hypotension   Nausea vomiting and diarrhea  Severe hyponatremia: Hx of chronic hyponatremia. Na 104 on presentation and improved to 129.  Urine sodium elevated suggesting SIADH.  However, sodium level improved with IV fluid. Random cortisol 9.3.  TSH normal. -Change  1/2NS to NS and continue at 100 cc an hour -Follow ACTH level -Continue p.o. sodium chloride -Continue monitoring  Hypokalemia/hypomagnesemia/hypochloremia/hypophosphatemia: Resolved. -Monitor daily  Acute toxic metabolic encephalopathy: Likely iatrogenic from Xanax and Seroquel.  She says she has been out of his Xanax for a long time.  She is not sure if she could have taken home additional medications from home bottles.  She has a bottle of Flexeril.  Encephalopathy workup including CT head, ABG, ammonia, B12, TSH and infectious workup unrevealing.  She has no focal neurodeficit.  In cephalopathy resolved. -Fall, aspiration and delirium precaution -Minimize sedating medications.  Hypotension/circulatory shock: Likely iatrogenic. Required Levophed 1/13-1/14.  Infectious workup negative.  TTE without significant finding. -Vancomycin discontinued on 1/14. -Will discuss the utility of  IV Zosyn with intensivist. -Increase Toprol-XL from 25 to 50 mg daily.  Takes 100 mg at home. -Continue holding amlodipine and clonidine -Follow ACTH  Nausea/vomiting/diarrhea: Resolving other than mild residual nausea.  Tolerating oral intake.  Abdominal exam benign. -Discontinue enteric precaution. -Continue IV fluid  Elevated troponin: Likely demand ischemia.  Echocardiogram reassuring.  Patient has no chest pain.  Chest congestion/cough: Suspect viral URI.  COVID-19, influenza and RSV PCR nonreactive. -Symptomatic management with bronchodilators, mucolytic's and antitussive -Encourage incentive spirometry, OOB/PT/OT  Anxiety/depression/chronic pain: Could be contributing to her fatigue -Discontinue Xanax.  She states she no longer take this. -Increase Seroquel to 50 mg twice daily.  Takes 100 mg twice daily at home.  Urinary retention: Foley catheter placed on 1/10 by his urologist.  Plan is to continue Foley until she undergoes UDS at [facility] -Continue Foley catheter.  Chronic COPD: Stable.  ABG reassuring. -Inhalers and nebulizers  Tongue ulcer: healing ulcers on underside of her tongue on both sides.  Unclear etiology of this. -Magic mouthwash  Generalized weakness/physical deconditioning: TSH within normal.  Cortisol low in her situation. -Follow ACTH -OOB/PT/OT eval   Body mass index is 25.75 kg/m.           DVT prophylaxis:  enoxaparin (LOVENOX) injection 40 mg Start: 07/01/22 2200  Code Status: DNR/DNI Family Communication: None at bedside.   Level of care: Telemetry Medical Status is: Inpatient Remains inpatient appropriate because: Hyponatremia, generalized weakness   Final disposition: Low clear Home with home health. Consultants:  Pulmonology/critical care    Sch Meds:  Scheduled Meds:  atorvastatin  40 mg Oral Daily   budesonide (PULMICORT) nebulizer solution  0.5 mg Nebulization BID   Chlorhexidine Gluconate Cloth  6 each Topical Daily    enoxaparin (LOVENOX) injection  40 mg Subcutaneous Q24H   FLUoxetine  10 mg Oral Daily   guaiFENesin  600 mg Oral BID   magic mouthwash w/lidocaine  5 mL Oral QID   metoprolol succinate  50 mg Oral Daily   pantoprazole  40 mg Oral Daily   polyethylene glycol  17 g Oral Daily   QUEtiapine  50 mg Oral BID   senna-docusate  2 tablet Oral BID   sodium chloride  1 g Oral BID WC   Vitamin D (Ergocalciferol)  50,000 Units Oral Weekly   Continuous Infusions:  sodium chloride 100 mL/hr at 07/04/22 1013   piperacillin-tazobactam (ZOSYN)  IV 3.375 g (07/04/22 1014)   sodium chloride Stopped (07/02/22 1200)   PRN Meds:.acetaminophen **OR** acetaminophen, benzonatate, butalbital-acetaminophen-caffeine, dicyclomine, gabapentin, guaiFENesin-dextromethorphan, magnesium hydroxide, ondansetron **OR** ondansetron (ZOFRAN) IV, traZODone  Antimicrobials: Anti-infectives (From admission, onward)    Start     Dose/Rate Route Frequency Ordered Stop   07/03/22 1800  vancomycin (VANCOREADY) IVPB 1750 mg/350 mL  Status:  Discontinued        1,750 mg 175 mL/hr over 120 Minutes Intravenous Every 24 hours 07/02/22 1551 07/03/22 1048   07/03/22 1800  vancomycin (VANCOREADY) IVPB 1500 mg/300 mL  Status:  Discontinued        1,500 mg 150 mL/hr over 120 Minutes Intravenous Every 24 hours 07/03/22 1048 07/03/22 1133   07/03/22 0000  vancomycin (VANCOREADY) IVPB 1750 mg/350 mL  Status:  Discontinued  1,750 mg 175 mL/hr over 120 Minutes Intravenous Every 24 hours 07/02/22 1549 07/02/22 1551   07/02/22 1600  vancomycin (VANCOREADY) IVPB 1500 mg/300 mL        1,500 mg 150 mL/hr over 120 Minutes Intravenous  Once 07/02/22 1549 07/02/22 1953   07/02/22 1600  piperacillin-tazobactam (ZOSYN) IVPB 3.375 g        3.375 g 12.5 mL/hr over 240 Minutes Intravenous Every 8 hours 07/02/22 1549     07/01/22 2156  Uribel 118 MG CAPS 118 mg  Status:  Discontinued        1 capsule Oral Every 6 hours PRN 07/01/22 2158 07/01/22  2201        I have personally reviewed the following labs and images: CBC: Recent Labs  Lab 07/01/22 1859 07/02/22 0438  WBC 17.6* 11.1*  NEUTROABS 13.8*  --   HGB 11.9* 11.0*  HCT 32.7* 30.1*  MCV 82.6 82.9  PLT 271 223   BMP &GFR Recent Labs  Lab 07/01/22 1859 07/01/22 2238 07/02/22 0139 07/02/22 0438 07/03/22 0313 07/03/22 0319 07/03/22 0853 07/03/22 1437 07/03/22 1952 07/04/22 0248  NA 104*   < >  --    < >  --  129* 127* 129* 129* 129*  K 2.2*   < >  --    < >  --  4.6 4.4 4.5 4.2 4.0  CL 66*   < >  --    < >  --  98 98 100 98 97*  CO2 26   < >  --    < >  --  24 25 25 26 26   GLUCOSE 136*   < >  --    < >  --  120* 103* 105* 111* 100*  BUN 8   < >  --    < >  --  5* <5* <5* 5* 6*  CREATININE 0.50   < >  --    < >  --  0.60 0.55 0.67 0.68 0.73  CALCIUM 7.1*   < >  --    < >  --  7.7* 7.4* 7.9* 7.9* 7.8*  MG 1.4*  --  3.1*  --  2.1  --   --   --   --   --   PHOS  --   --   --    < >  --  4.0 3.3 3.1 2.9 3.5   < > = values in this interval not displayed.   Estimated Creatinine Clearance: 69.1 mL/min (by C-G formula based on SCr of 0.73 mg/dL). Liver & Pancreas: Recent Labs  Lab 07/01/22 1859 07/02/22 0139 07/02/22 1118 07/02/22 1317 07/03/22 0319 07/03/22 0853 07/03/22 1437 07/03/22 1952 07/04/22 0248  AST 42*  --  25  --   --   --   --   --   --   ALT 53*  --  37  --   --   --   --   --   --   ALKPHOS 54  --  44  --   --   --   --   --   --   BILITOT 0.9  --  0.6  --   --   --   --   --   --   PROT 6.0*  --  4.6*  --   --   --   --   --   --   ALBUMIN 3.1*   < >  2.3*   < > 2.7* 2.6* 2.6* 2.8* 2.5*   < > = values in this interval not displayed.   Recent Labs  Lab 07/01/22 1859  LIPASE 28   Recent Labs  Lab 07/02/22 1115  AMMONIA 18   Diabetic: No results for input(s): "HGBA1C" in the last 72 hours. Recent Labs  Lab 07/02/22 1444 07/02/22 1955  GLUCAP 104* 117*   Cardiac Enzymes: No results for input(s): "CKTOTAL", "CKMB",  "CKMBINDEX", "TROPONINI" in the last 168 hours. No results for input(s): "PROBNP" in the last 8760 hours. Coagulation Profile: No results for input(s): "INR", "PROTIME" in the last 168 hours. Thyroid Function Tests: Recent Labs    07/02/22 1317  TSH 0.563   Lipid Profile: No results for input(s): "CHOL", "HDL", "LDLCALC", "TRIG", "CHOLHDL", "LDLDIRECT" in the last 72 hours. Anemia Panel: No results for input(s): "VITAMINB12", "FOLATE", "FERRITIN", "TIBC", "IRON", "RETICCTPCT" in the last 72 hours. Urine analysis:    Component Value Date/Time   COLORURINE COLORLESS (A) 07/01/2022 2354   APPEARANCEUR CLEAR (A) 07/01/2022 2354   APPEARANCEUR Clear 05/24/2022 1536   LABSPEC 1.005 07/01/2022 2354   PHURINE 7.0 07/01/2022 2354   GLUCOSEU NEGATIVE 07/01/2022 2354   HGBUR SMALL (A) 07/01/2022 2354   BILIRUBINUR NEGATIVE 07/01/2022 2354   BILIRUBINUR Negative 05/24/2022 1536   KETONESUR 5 (A) 07/01/2022 2354   PROTEINUR NEGATIVE 07/01/2022 2354   UROBILINOGEN 0.2 11/13/2007 2030   NITRITE NEGATIVE 07/01/2022 2354   LEUKOCYTESUR TRACE (A) 07/01/2022 2354   Sepsis Labs: Invalid input(s): "PROCALCITONIN", "LACTICIDVEN"  Microbiology: Recent Results (from the past 240 hour(s))  Resp panel by RT-PCR (RSV, Flu A&B, Covid) Nasopharyngeal Swab     Status: None   Collection Time: 07/01/22  7:36 PM   Specimen: Nasopharyngeal Swab; Nasal Swab  Result Value Ref Range Status   SARS Coronavirus 2 by RT PCR NEGATIVE NEGATIVE Final    Comment: (NOTE) SARS-CoV-2 target nucleic acids are NOT DETECTED.  The SARS-CoV-2 RNA is generally detectable in upper respiratory specimens during the acute phase of infection. The lowest concentration of SARS-CoV-2 viral copies this assay can detect is 138 copies/mL. A negative result does not preclude SARS-Cov-2 infection and should not be used as the sole basis for treatment or other patient management decisions. A negative result may occur with  improper  specimen collection/handling, submission of specimen other than nasopharyngeal swab, presence of viral mutation(s) within the areas targeted by this assay, and inadequate number of viral copies(<138 copies/mL). A negative result must be combined with clinical observations, patient history, and epidemiological information. The expected result is Negative.  Fact Sheet for Patients:  BloggerCourse.comhttps://www.fda.gov/media/152166/download  Fact Sheet for Healthcare Providers:  SeriousBroker.ithttps://www.fda.gov/media/152162/download  This test is no t yet approved or cleared by the [place] FDA and  has been authorized for detection and/or diagnosis of SARS-CoV-2 by FDA under an Emergency Use Authorization (EUA). This EUA will remain  in effect (meaning this test can be used) for the duration of the COVID-19 declaration under Section 564(b)(1) of the Act, 21 U.S.C.section 360bbb-3(b)(1), unless the authorization is terminated  or revoked sooner.       Influenza A by PCR NEGATIVE NEGATIVE Final   Influenza B by PCR NEGATIVE NEGATIVE Final    Comment: (NOTE) The Xpert Xpress SARS-CoV-2/FLU/RSV plus assay is intended as an aid in the diagnosis of influenza from Nasopharyngeal swab specimens and should not be used as a sole basis for treatment. Nasal washings and aspirates are unacceptable for Xpert Xpress SARS-CoV-2/FLU/RSV testing.  Fact Sheet for Patients: EntrepreneurPulse.com.au  Fact Sheet for Healthcare Providers: IncredibleEmployment.be  This test is not yet approved or cleared by the [place] FDA and has been authorized for detection and/or diagnosis of SARS-CoV-2 by FDA under an Emergency Use Authorization (EUA). This EUA will remain in effect (meaning this test can be used) for the duration of the COVID-19 declaration under Section 564(b)(1) of the Act, 21 U.S.C. section 360bbb-3(b)(1), unless the authorization is terminated or revoked.     Resp  Syncytial Virus by PCR NEGATIVE NEGATIVE Final    Comment: (NOTE) Fact Sheet for Patients: EntrepreneurPulse.com.au  Fact Sheet for Healthcare Providers: IncredibleEmployment.be  This test is not yet approved or cleared by the [place] FDA and has been authorized for detection and/or diagnosis of SARS-CoV-2 by FDA under an Emergency Use Authorization (EUA). This EUA will remain in effect (meaning this test can be used) for the duration of the COVID-19 declaration under Section 564(b)(1) of the Act, 21 U.S.C. section 360bbb-3(b)(1), unless the authorization is terminated or revoked.  Performed at [facility], [address] [place], [place] [address]   Blood culture (routine x 2)     Status: None (Preliminary result)   Collection Time: 07/01/22  7:36 PM   Specimen: BLOOD  Result Value Ref Range Status   Specimen Description BLOOD BLOOD RIGHT ARM  Final   Special Requests   Final    BOTTLES DRAWN AEROBIC AND ANAEROBIC Blood Culture adequate volume   Culture   Final    NO GROWTH 3 DAYS Performed at [facility], [address] [place], [place] [address]    Report Status PENDING  Incomplete  Blood culture (routine x 2)     Status: None (Preliminary result)   Collection Time: 07/01/22  7:36 PM   Specimen: BLOOD  Result Value Ref Range Status   Specimen Description BLOOD BLOOD RIGHT HAND  Final   Special Requests   Final    BOTTLES DRAWN AEROBIC AND ANAEROBIC Blood Culture adequate volume   Culture   Final    NO GROWTH 3 DAYS Performed at [facility], [address], [place], [place] [address]    Report Status PENDING  Incomplete  Urine Culture     Status: None   Collection Time: 07/01/22 11:54 PM   Specimen: Urine, Catheterized  Result Value Ref Range Status   Specimen Description   Final    URINE, CATHETERIZED Performed at [facility], [address] [place], [place] [address]     Special Requests   Final    Normal Performed at [facility], [address] [place], [place] [address]    Culture   Final    NO GROWTH Performed at [facility], [place] [address] [place], [place] [address]    Report Status 07/03/2022 FINAL  Final  MRSA Next Gen by PCR, Nasal     Status: None   Collection Time: 07/02/22  8:00 PM   Specimen: Nasal Mucosa; Nasal Swab  Result Value Ref Range Status   MRSA by PCR Next Gen NOT DETECTED NOT DETECTED Final    Comment: (NOTE) The GeneXpert MRSA Assay (FDA approved for NASAL specimens only), is one component of a comprehensive MRSA colonization surveillance program. It is not intended to diagnose MRSA infection nor to guide or monitor treatment for MRSA infections. Test performance is not FDA approved in patients less than 79 years old. Performed at [facility], [address],  [place], [place] [address]     Radiology Studies: No results found.  CRITICAL CARE Performed by: [facility]    If 7PM-7AM, please contact night-coverage [email or web address] 07/04/2022, 12:05 PM

## 2022-07-04 NOTE — Evaluation (Signed)
Occupational Therapy Evaluation Patient Details Name: Katelyn [name] MRN: [id number] DOB: [birth year] Today's Date: 07/04/2022   History of Present Illness 62 y/o female presented to ED on 07/01/22 after vomitting and going unresponsive x 5 mins. Admitted for hyponatremia. Recent admission 1/1-06/24/22 for acute encephalopathy. PMH: anxiety, depression, HTN, tobacco abuse, COPD   Clinical Impression   Patient seen for OT evaluation, received sitting up in bed completing self-feeding. Pt presenting with decreased independence in self care, balance, functional mobility/transfers, endurance, and safety awareness. PTA pt lived with son, was independent for ADLs, received assistance PRN for IADLs, and was independent for functional mobility without an AD. Pt currently functioning at supervision for bed mobility, Min guard to stand from EOB, and Min guard for step pivot transfer to recliner with no AD. Anticipate CGA-Min A for standing ADL tasks. Pt endorsing back pain t/o session, RN notified pt requesting pain meds. Pt follow commands well, however, demonstrated decreased insight and awareness. Pt will benefit from acute OT to increase overall independence in the areas of ADLs and functional mobility in order to safely discharge home. Pt could benefit from [facility] following D/C to decrease falls risk, improve balance, and maximize independence in self-care within own home environment.       Recommendations for follow up therapy are one component of a multi-disciplinary discharge planning process, led by the attending physician.  Recommendations may be updated based on patient status, additional functional criteria and insurance authorization.   Follow Up Recommendations  Home health OT     Assistance Recommended at Discharge Intermittent Supervision/Assistance  Patient can return home with the following Assist for transportation;Direct supervision/assist for financial management;Direct supervision/assist  for medications management;Assistance with cooking/housework;Help with stairs or ramp for entrance;A little help with walking and/or transfers;A little help with bathing/dressing/bathroom    Functional Status Assessment  Patient has had a recent decline in their functional status and demonstrates the ability to make significant improvements in function in a reasonable and predictable amount of time.  Equipment Recommendations  None recommended by OT    Recommendations for Other Services       Precautions / Restrictions Precautions Precautions: Fall Restrictions Weight Bearing Restrictions: No      Mobility Bed Mobility Overal bed mobility: Needs Assistance Bed Mobility: Supine to Sit     Supine to sit: Supervision, HOB elevated          Transfers Overall transfer level: Needs assistance Equipment used: None Transfers: Sit to/from Stand, Bed to chair/wheelchair/BSC Sit to Stand: Min guard     Step pivot transfers: Min guard     General transfer comment: min guard for safety. unsteady with stepping towards recliner      Balance Overall balance assessment: Needs assistance Sitting-balance support: Feet supported Sitting balance-Leahy Scale: Good     Standing balance support: No upper extremity supported, During functional activity Standing balance-Leahy Scale: Poor                             ADL either performed or assessed with clinical judgement   ADL Overall ADL's : Needs assistance/impaired                 Upper Body Dressing : Set up;Sitting       Toilet Transfer: Min guard Toilet Transfer Details (indicate cue type and reason): simulated, short ambulatory transfer to bedside recliner, no AD           General ADL  Comments: Anticipate CGA-Min A for standing ADL tasks     Vision Baseline Vision/History: 1 Wears glasses (reading) Patient Visual Report: No change from baseline       Perception     Praxis      Pertinent  Vitals/Pain Pain Assessment Pain Assessment: Faces Faces Pain Scale: Hurts little more Pain Location: back Pain Descriptors / Indicators: Aching, Discomfort Pain Intervention(s): Monitored during session, Limited activity within patient's tolerance, Repositioned, Patient requesting pain meds-RN notified     Hand Dominance Right   Extremity/Trunk Assessment Upper Extremity Assessment Upper Extremity Assessment: Generalized weakness   Lower Extremity Assessment Lower Extremity Assessment: Generalized weakness   Cervical / Trunk Assessment Cervical / Trunk Assessment: Normal   Communication Communication Communication: No difficulties   Cognition Arousal/Alertness: Awake/alert Behavior During Therapy: Flat affect Overall Cognitive Status: No family/caregiver present to determine baseline cognitive functioning Area of Impairment: Problem solving, Safety/judgement                         Safety/Judgement: Decreased awareness of deficits   Problem Solving: Slow processing General Comments: very flat affect. Follows commands appropriatley but seems to required increased time to process. Decreased awareness into overall situation and deficits     General Comments  VSS on RA. Pt endorsing dizziness upon standing, however, non-limiting and improved once sitting down.    Exercises Other Exercises Other Exercises: OT provided education re: role of OT, OT POC, post acute recs, sitting up for all meals, EOB/OOB mobility with assistance, home/fall safety.     Shoulder Instructions      Home Living Family/patient expects to be discharged to:: Private residence Living Arrangements: Children (son) Available Help at Discharge: Family (pt reports son does not work) Type of Home: House Home Access: Stairs to enter [employer] of Steps: 4-5 at side and front entrance, rails on side entrance only   Home Layout: One level     Bathroom Shower/Tub: Medical illustrator: Standard Bathroom Accessibility: Yes   Home Equipment: Conservation officer, nature (2 wheels)   Additional Comments: thinks she has a RW (going to confirm with son)      Prior Functioning/Environment Prior Level of Function : Independent/Modified Independent;History of Falls (last six months)             Mobility Comments: Independent without AD, 1 fall past 6 months ADLs Comments: IND ADLs, assist PRN for IADLs (transportation, groceries), IND med mgmt, normally IND for cooking/cleaning/laundry but needing assist recently        OT Problem List: Decreased strength;Decreased activity tolerance;Impaired balance (sitting and/or standing);Pain;Decreased safety awareness;Cardiopulmonary status limiting activity;Decreased cognition      OT Treatment/Interventions: Self-care/ADL training;Therapeutic activities;Cognitive remediation/compensation;Balance training;DME and/or AE instruction;Energy conservation;Patient/family education;Therapeutic exercise    OT Goals(Current goals can be found in the care plan section) Acute Rehab OT Goals Patient Stated Goal: return home OT Goal Formulation: With patient Time For Goal Achievement: 07/18/22 Potential to Achieve Goals: Good   OT Frequency: Min 2X/week    Co-evaluation              AM-PAC OT "6 Clicks" Daily Activity     Outcome Measure Help from another person eating meals?: None Help from another person taking care of personal grooming?: A Little Help from another person toileting, which includes using toliet, bedpan, or urinal?: A Little Help from another person bathing (including washing, rinsing, drying)?: A Little Help from another person to put on and  taking off regular upper body clothing?: None Help from another person to put on and taking off regular lower body clothing?: A Little 6 Click Score: 20   End of Session Nurse Communication: Mobility status;Patient requests pain meds  Activity Tolerance: Patient  limited by pain;Patient limited by fatigue Patient left: in chair;with call bell/phone within reach;with chair alarm set  OT Visit Diagnosis: Unsteadiness on feet (R26.81);Muscle weakness (generalized) (M62.81);History of falling (Z91.81);Pain Pain - Right/Left:  (back)                Time: 6301-6010 OT Time Calculation (min): 20 min Charges:  OT General Charges $OT Visit: 1 Visit OT Evaluation $OT Eval Low Complexity: 1 Low  [facility] MS, OTR/L ascom [phone]  07/04/22, 2:02 PM

## 2022-07-04 NOTE — Plan of Care (Signed)

## 2022-07-04 NOTE — Consult Note (Signed)
PHARMACY CONSULT NOTE - FOLLOW UP  Pharmacy Consult for Electrolyte Monitoring and Replacement   Recent Labs: Potassium (mmol/L)  Date Value  07/04/2022 4.0  10/09/2012 4.1   Magnesium (mg/dL)  Date Value  07/03/2022 2.1   Calcium (mg/dL)  Date Value  07/04/2022 7.8 (L)   Calcium, Total (mg/dL)  Date Value  10/09/2012 8.9   Albumin (g/dL)  Date Value  07/04/2022 2.5 (L)   Phosphorus (mg/dL)  Date Value  07/04/2022 3.5   Sodium (mmol/L)  Date Value  07/04/2022 129 (L)  10/09/2012 132 (L)   Corrected Ca: 9.0  Assessment: 62 year old female with a history of anxiety and depression, essential hypertension, GERD, dyslipidemia COPD with active tobacco abuse who came into the ED with altered mental status and diarrhea. Found to be lethargic and with severe hyponatremia with a sodium of 104. Pharmacy has been consulted to monitor and replace electrolytes as needed.   Pertinent medications: IVF: NS @ 100 mL/hr PO NaCl 1 gram tabs: 1 gram BID  Goal of Therapy:  Electrolytes WNL  Plan:  Sodium: Na 104-->114>107-->129>127>129: continues on NS@100ml /hr(ordered by MD), nephrology consulted.  Calcium: 7.8, corrected Ca 9.0: No further correction warranted Phosphorous: 3.5: No further correction warranted Potassium: >2.0 >> 4.4 >> 4.0: No further correction warranted Daily renal function panel ordered by provider Follow up with AM labs   Paulina Fusi, PharmD, BCPS 07/04/2022 2:05 PM

## 2022-07-04 NOTE — Progress Notes (Signed)
CRITICAL CARE     Name: Katelyn [name] MRN: [id number] DOB: Jul 06, 1960     LOS: 3   SUBJECTIVE FINDINGS & SIGNIFICANT EVENTS    History of presenting illness:  This is a 62 year old female with a history of anxiety and depression, essential hypertension, GERD, dyslipidemia COPD with active tobacco abuse who came into the ED with altered mental status and diarrhea.  Was found to be lethargic and with severe hyponatremia with a sodium of 104.  Was also hypertensive on admission, also noted to have transaminitis with a low albumin leukocytosis.  Patient had a negative drug screen on admission and negative for COVID RSV and influenza.   07/03/22- patient seems clinically improved. She has been speaking more today.  Pharmacy , nephrology following appreciate input. CT abd/pelvis no signs of neoplasm in chest abdomen or pelvis. Na slightly overcorrected overnight with trend down today which is good.  Overall improvement noted.   07/04/22- patient is improved, breathing is stable today.  DCD antibiotics.  Reviewed with Dr Alanda Slim.  Patient  Is on salt tablets, had labs suggestive of SIADH.  She is being optimized medically for dc home, had electrolytes repleted.  Lines/tubes : Urethral Catheter Katheran James RN Double-lumen 16 Fr. (Active)  Indication for Insertion or Continuance of Catheter Acute urinary retention (I&O Cath for 24 hrs prior to catheter insertion- Inpatient Only) 07/01/22 2352  Site Assessment Clean, Dry, Intact 07/01/22 2352  Catheter Maintenance Bag below level of bladder;Catheter secured;Drainage bag/tubing not touching floor;Insertion date on drainage bag;No dependent loops;Seal intact 07/01/22 2352  Collection Container Standard drainage bag 07/01/22 2352  Securement Method Securing device (Describe)  07/01/22 2352    Microbiology/Sepsis markers: Results for orders placed or performed during the hospital encounter of 07/01/22  Resp panel by RT-PCR (RSV, Flu A&B, Covid) Nasopharyngeal Swab     Status: None   Collection Time: 07/01/22  7:36 PM   Specimen: Nasopharyngeal Swab; Nasal Swab  Result Value Ref Range Status   SARS Coronavirus 2 by RT PCR NEGATIVE NEGATIVE Final    Comment: (NOTE) SARS-CoV-2 target nucleic acids are NOT DETECTED.  The SARS-CoV-2 RNA is generally detectable in upper respiratory specimens during the acute phase of infection. The lowest concentration of SARS-CoV-2 viral copies this assay can detect is 138 copies/mL. A negative result does not preclude SARS-Cov-2 infection and should not be used as the sole basis for treatment or other patient management decisions. A negative result may occur with  improper specimen collection/handling, submission of specimen other than nasopharyngeal swab, presence of viral mutation(s) within the areas targeted by this assay, and inadequate number of viral copies(<138 copies/mL). A negative result must be combined with clinical observations, patient history, and epidemiological information. The expected result is Negative.  Fact Sheet for Patients:  BloggerCourse.com  Fact Sheet for Healthcare Providers:  SeriousBroker.it  This test is no t yet approved or cleared by the [place] FDA and  has been authorized for detection and/or diagnosis of SARS-CoV-2 by FDA under an Emergency Use Authorization (EUA). This EUA will remain  in effect (meaning this test can be used) for the duration of the COVID-19 declaration under Section 564(b)(1) of the Act, 21 U.S.C.section 360bbb-3(b)(1), unless the authorization is terminated  or revoked sooner.       Influenza A by PCR NEGATIVE NEGATIVE Final   Influenza B by PCR NEGATIVE NEGATIVE Final    Comment: (NOTE) The Xpert Xpress  SARS-CoV-2/FLU/RSV plus assay is intended as an aid in  the diagnosis of influenza from Nasopharyngeal swab specimens and should not be used as a sole basis for treatment. Nasal washings and aspirates are unacceptable for Xpert Xpress SARS-CoV-2/FLU/RSV testing.  Fact Sheet for Patients: EntrepreneurPulse.com.au  Fact Sheet for Healthcare Providers: IncredibleEmployment.be  This test is not yet approved or cleared by the [place] FDA and has been authorized for detection and/or diagnosis of SARS-CoV-2 by FDA under an Emergency Use Authorization (EUA). This EUA will remain in effect (meaning this test can be used) for the duration of the COVID-19 declaration under Section 564(b)(1) of the Act, 21 U.S.C. section 360bbb-3(b)(1), unless the authorization is terminated or revoked.     Resp Syncytial Virus by PCR NEGATIVE NEGATIVE Final    Comment: (NOTE) Fact Sheet for Patients: EntrepreneurPulse.com.au  Fact Sheet for Healthcare Providers: IncredibleEmployment.be  This test is not yet approved or cleared by the [place] FDA and has been authorized for detection and/or diagnosis of SARS-CoV-2 by FDA under an Emergency Use Authorization (EUA). This EUA will remain in effect (meaning this test can be used) for the duration of the COVID-19 declaration under Section 564(b)(1) of the Act, 21 U.S.C. section 360bbb-3(b)(1), unless the authorization is terminated or revoked.  Performed at [facility], [address] [place], [place] [address]   Blood culture (routine x 2)     Status: None (Preliminary result)   Collection Time: 07/01/22  7:36 PM   Specimen: BLOOD  Result Value Ref Range Status   Specimen Description BLOOD BLOOD RIGHT ARM  Final   Special Requests   Final    BOTTLES DRAWN AEROBIC AND ANAEROBIC Blood Culture adequate volume   Culture   Final    NO GROWTH 3 DAYS Performed at  [facility], [address] [place], [place] [address]    Report Status PENDING  Incomplete  Blood culture (routine x 2)     Status: None (Preliminary result)   Collection Time: 07/01/22  7:36 PM   Specimen: BLOOD  Result Value Ref Range Status   Specimen Description BLOOD BLOOD RIGHT HAND  Final   Special Requests   Final    BOTTLES DRAWN AEROBIC AND ANAEROBIC Blood Culture adequate volume   Culture   Final    NO GROWTH 3 DAYS Performed at [facility], [address], [place], [place] [address]    Report Status PENDING  Incomplete  Urine Culture     Status: None   Collection Time: 07/01/22 11:54 PM   Specimen: Urine, Catheterized  Result Value Ref Range Status   Specimen Description   Final    URINE, CATHETERIZED Performed at [facility], [address], [place], [place] [address]    Special Requests   Final    Normal Performed at [facility], [address], [place], [place] [address]    Culture   Final    NO GROWTH Performed at [facility], [place] [address] [place], [place] [address]    Report Status 07/03/2022 FINAL  Final  MRSA Next Gen by PCR, Nasal     Status: None   Collection Time: 07/02/22  8:00 PM   Specimen: Nasal Mucosa; Nasal Swab  Result Value Ref Range Status   MRSA by PCR Next Gen NOT DETECTED NOT DETECTED Final    Comment: (NOTE) The GeneXpert MRSA Assay (FDA approved for NASAL specimens only), is one component of a comprehensive MRSA colonization surveillance program. It is not intended to diagnose MRSA infection nor  to guide or monitor treatment for MRSA infections. Test performance is not FDA approved in patients less than 78 years old. Performed at [facility], [address], [place], [place] [address]     Anti-infectives:  Anti-infectives (From admission, onward)    Start     Dose/Rate Route Frequency Ordered Stop   07/03/22 1800  vancomycin (VANCOREADY) IVPB 1750 mg/350 mL   Status:  Discontinued        1,750 mg 175 mL/hr over 120 Minutes Intravenous Every 24 hours 07/02/22 1551 07/03/22 1048   07/03/22 1800  vancomycin (VANCOREADY) IVPB 1500 mg/300 mL  Status:  Discontinued        1,500 mg 150 mL/hr over 120 Minutes Intravenous Every 24 hours 07/03/22 1048 07/03/22 1133   07/03/22 0000  vancomycin (VANCOREADY) IVPB 1750 mg/350 mL  Status:  Discontinued        1,750 mg 175 mL/hr over 120 Minutes Intravenous Every 24 hours 07/02/22 1549 07/02/22 1551   07/02/22 1600  vancomycin (VANCOREADY) IVPB 1500 mg/300 mL        1,500 mg 150 mL/hr over 120 Minutes Intravenous  Once 07/02/22 1549 07/02/22 1953   07/02/22 1600  piperacillin-tazobactam (ZOSYN) IVPB 3.375 g        3.375 g 12.5 mL/hr over 240 Minutes Intravenous Every 8 hours 07/02/22 1549     07/01/22 2156  Uribel 118 MG CAPS 118 mg  Status:  Discontinued        1 capsule Oral Every 6 hours PRN 07/01/22 2158 07/01/22 2201        PAST MEDICAL HISTORY   Past Medical History:  Diagnosis Date   Anxiety    COPD (chronic obstructive pulmonary disease) (HCC)    Depressive disorder    GERD (gastroesophageal reflux disease)    Hiatal hernia    HLD (hyperlipidemia)    Hypertension    IBS (irritable bowel syndrome)    IC (interstitial cystitis)    Migraine    Nicotine dependence    Nontoxic goiter    Overactive bladder    Smoking      SURGICAL HISTORY   Past Surgical History:  Procedure Laterality Date   ABDOMINAL HYSTERECTOMY     ADENOIDECTOMY     CHOLECYSTECTOMY     HERNIA REPAIR     NECK SURGERY     SHOULDER SURGERY     TONSILLECTOMY       FAMILY HISTORY   Family History  Problem Relation Age of Onset   CAD Father        onset age 75   Heart disease Father    Kidney disease Father    Prostate cancer Father    Colon polyps Maternal Grandmother    Colon cancer Paternal Grandmother    Bladder Cancer Neg Hx    Kidney cancer Neg Hx      SOCIAL HISTORY   Social History    Tobacco Use   Smoking status: Every Day    Packs/day: 0.50    Types: Cigarettes   Smokeless tobacco: Never  Substance Use Topics   Alcohol use: No   Drug use: No     MEDICATIONS   Current Medication:  Current Facility-Administered Medications:    0.9 %  sodium chloride infusion, , Intravenous, Continuous, Gonfa, Taye T, MD, Last Rate: 100 mL/hr at 07/04/22 1013, New Bag at 07/04/22 1013   acetaminophen (TYLENOL) tablet 650 mg, 650 mg, Oral, Q6H PRN, 650 mg at 07/04/22 1056 **OR** acetaminophen (TYLENOL) suppository  650 mg, 650 mg, Rectal, Q6H PRN, Gonfa, Taye T, MD   atorvastatin (LIPITOR) tablet 40 mg, 40 mg, Oral, Daily, Gonfa, Taye T, MD, 40 mg at 07/04/22 0842   benzonatate (TESSALON) capsule 100 mg, 100 mg, Oral, BID PRN, Alanda Slim, Taye T, MD, 100 mg at 07/04/22 1109   budesonide (PULMICORT) nebulizer solution 0.5 mg, 0.5 mg, Nebulization, BID, Gonfa, Taye T, MD, 0.5 mg at 07/04/22 0750   butalbital-acetaminophen-caffeine (FIORICET) 50-325-40 MG per tablet 1 tablet, 1 tablet, Oral, Daily PRN, Candelaria Stagers T, MD, 1 tablet at 07/03/22 2346   Chlorhexidine Gluconate Cloth 2 % PADS 6 each, 6 each, Topical, Daily, Alanda Slim, Taye T, MD, 6 each at 07/04/22 1207   dicyclomine (BENTYL) capsule 10 mg, 10 mg, Oral, TID PRN, Alanda Slim, Taye T, MD, 10 mg at 07/01/22 2227   enoxaparin (LOVENOX) injection 40 mg, 40 mg, Subcutaneous, Q24H, Gonfa, Taye T, MD, 40 mg at 07/03/22 2124   FLUoxetine (PROZAC) capsule 10 mg, 10 mg, Oral, Daily, Gonfa, Taye T, MD, 10 mg at 07/04/22 0842   gabapentin (NEURONTIN) capsule 300 mg, 300 mg, Oral, TID PRN, Alanda Slim, Taye T, MD, 300 mg at 07/04/22 1109   guaiFENesin (MUCINEX) 12 hr tablet 600 mg, 600 mg, Oral, BID, Gonfa, Taye T, MD, 600 mg at 07/04/22 0841   guaiFENesin-dextromethorphan (ROBITUSSIN DM) 100-10 MG/5ML syrup 5 mL, 5 mL, Oral, Q4H PRN, Alanda Slim, Taye T, MD   magic mouthwash w/lidocaine, 5 mL, Oral, QID, Gonfa, Taye T, MD, 5 mL at 07/03/22 2129   magnesium  hydroxide (MILK OF MAGNESIA) suspension 30 mL, 30 mL, Oral, Daily PRN, Alanda Slim, Taye T, MD, 30 mL at 07/01/22 2228   metoprolol succinate (TOPROL-XL) 24 hr tablet 50 mg, 50 mg, Oral, Daily, Gonfa, Taye T, MD, 50 mg at 07/04/22 0842   ondansetron (ZOFRAN) tablet 4 mg, 4 mg, Oral, Q6H PRN, 4 mg at 07/03/22 0357 **OR** ondansetron (ZOFRAN) injection 4 mg, 4 mg, Intravenous, Q6H PRN, Alanda Slim, Taye T, MD, 4 mg at 07/01/22 2228   pantoprazole (PROTONIX) EC tablet 40 mg, 40 mg, Oral, Daily, Gonfa, Taye T, MD, 40 mg at 07/04/22 0842   piperacillin-tazobactam (ZOSYN) IVPB 3.375 g, 3.375 g, Intravenous, Q8H, Gonfa, Taye T, MD, Last Rate: 12.5 mL/hr at 07/04/22 1014, 3.375 g at 07/04/22 1014   polyethylene glycol (MIRALAX / GLYCOLAX) packet 17 g, 17 g, Oral, Daily, Gonfa, Taye T, MD, 17 g at 07/02/22 7035   QUEtiapine (SEROQUEL) tablet 50 mg, 50 mg, Oral, BID, Gonfa, Taye T, MD   senna-docusate (Senokot-S) tablet 2 tablet, 2 tablet, Oral, BID, Alanda Slim, Taye T, MD, 2 tablet at 07/04/22 0841   sodium chloride 0.9 % bolus 1,000 mL, 1,000 mL, Intravenous, Once, Candelaria Stagers T, MD, Held at 07/02/22 1200   sodium chloride tablet 1 g, 1 g, Oral, BID WC, Gonfa, Taye T, MD, 1 g at 07/04/22 0842   traZODone (DESYREL) tablet 25 mg, 25 mg, Oral, QHS PRN, Alanda Slim, Taye T, MD, 25 mg at 07/03/22 2346   Vitamin D (Ergocalciferol) (DRISDOL) 1.25 MG (50000 UNIT) capsule 50,000 Units, 50,000 Units, Oral, Weekly, Alanda Slim, Taye T, MD, 50,000 Units at 07/02/22 0033    ALLERGIES   Ciprofloxacin, Macrobid [nitrofurantoin monohyd macro], and Sulfa antibiotics    REVIEW OF SYSTEMS     Unable to obtain review of systems due to encephalopathy with lethargy  PHYSICAL EXAMINATION   Vital Signs: Temp:  [97.4 F (36.3 C)-98.3 F (36.8 C)] 97.4 F (36.3 C) (01/15 0815) Pulse Rate:  [  82-92] 84 (01/15 0815) Resp:  [15-24] 17 (01/15 0815) BP: (104-182)/(68-91) 182/91 (01/15 0815) SpO2:  [96 %-100 %] 99 % (01/15 0815)  GENERAL:Age  approprate NAD  HEAD: Normocephalic, atraumatic.  EYES: Pupils equal, round, reactive to light.  No scleral icterus.  MOUTH: Moist mucosal membrane. NECK: Supple. No thyromegaly. No nodules. No JVD.  PULMONARY: clear to auscultation bilaterally  CARDIOVASCULAR: S1 and S2. Regular rate and rhythm. No murmurs, rubs, or gallops.  GASTROINTESTINAL: Soft, nontender, non-distended. No masses. Positive bowel sounds. No hepatosplenomegaly.  MUSCULOSKELETAL: No swelling, clubbing, or edema.  NEUROLOGIC: encephalopathy GCS 10 SKIN:intact,warm,dry   PERTINENT DATA     Infusions:  sodium chloride 100 mL/hr at 07/04/22 1013   piperacillin-tazobactam (ZOSYN)  IV 3.375 g (07/04/22 1014)   sodium chloride Stopped (07/02/22 1200)   Scheduled Medications:  atorvastatin  40 mg Oral Daily   budesonide (PULMICORT) nebulizer solution  0.5 mg Nebulization BID   Chlorhexidine Gluconate Cloth  6 each Topical Daily   enoxaparin (LOVENOX) injection  40 mg Subcutaneous Q24H   FLUoxetine  10 mg Oral Daily   guaiFENesin  600 mg Oral BID   magic mouthwash w/lidocaine  5 mL Oral QID   metoprolol succinate  50 mg Oral Daily   pantoprazole  40 mg Oral Daily   polyethylene glycol  17 g Oral Daily   QUEtiapine  50 mg Oral BID   senna-docusate  2 tablet Oral BID   sodium chloride  1 g Oral BID WC   Vitamin D (Ergocalciferol)  50,000 Units Oral Weekly   PRN Medications: acetaminophen **OR** acetaminophen, benzonatate, butalbital-acetaminophen-caffeine, dicyclomine, gabapentin, guaiFENesin-dextromethorphan, magnesium hydroxide, ondansetron **OR** ondansetron (ZOFRAN) IV, traZODone Hemodynamic parameters:   Intake/Output: 01/14 0701 - 01/15 0700 In: 1780.9 [P.O.:705; I.V.:878.3; IV Piggyback:197.6] Out: 2150 [Urine:2150]  Ventilator  Settings:      LAB RESULTS:  Basic Metabolic Panel: Recent Labs  Lab 07/01/22 1859 07/01/22 2238 07/02/22 0139 07/02/22 0438 07/03/22 0313 07/03/22 0319  07/03/22 0853 07/03/22 1437 07/03/22 1952 07/04/22 0248  NA 104*   < >  --    < >  --  129* 127* 129* 129* 129*  K 2.2*   < >  --    < >  --  4.6 4.4 4.5 4.2 4.0  CL 66*   < >  --    < >  --  98 98 100 98 97*  CO2 26   < >  --    < >  --  24 25 25 26 26   GLUCOSE 136*   < >  --    < >  --  120* 103* 105* 111* 100*  BUN 8   < >  --    < >  --  5* <5* <5* 5* 6*  CREATININE 0.50   < >  --    < >  --  0.60 0.55 0.67 0.68 0.73  CALCIUM 7.1*   < >  --    < >  --  7.7* 7.4* 7.9* 7.9* 7.8*  MG 1.4*  --  3.1*  --  2.1  --   --   --   --   --   PHOS  --   --   --    < >  --  4.0 3.3 3.1 2.9 3.5   < > = values in this interval not displayed.    Liver Function Tests: Recent Labs  Lab 07/01/22 1859 07/02/22 0139 07/02/22 1118 07/02/22 1317  07/03/22 0319 07/03/22 0853 07/03/22 1437 07/03/22 1952 07/04/22 0248  AST 42*  --  25  --   --   --   --   --   --   ALT 53*  --  37  --   --   --   --   --   --   ALKPHOS 54  --  44  --   --   --   --   --   --   BILITOT 0.9  --  0.6  --   --   --   --   --   --   PROT 6.0*  --  4.6*  --   --   --   --   --   --   ALBUMIN 3.1*   < > 2.3*   < > 2.7* 2.6* 2.6* 2.8* 2.5*   < > = values in this interval not displayed.    Recent Labs  Lab 07/01/22 1859  LIPASE 28    Recent Labs  Lab 07/02/22 1115  AMMONIA 18    CBC: Recent Labs  Lab 07/01/22 1859 07/02/22 0438  WBC 17.6* 11.1*  NEUTROABS 13.8*  --   HGB 11.9* 11.0*  HCT 32.7* 30.1*  MCV 82.6 82.9  PLT 271 223    Cardiac Enzymes: No results for input(s): "CKTOTAL", "CKMB", "CKMBINDEX", "TROPONINI" in the last 168 hours. BNP: Invalid input(s): "POCBNP" CBG: Recent Labs  Lab 07/02/22 1444 07/02/22 1955  GLUCAP 104* 117*        IMAGING RESULTS:  Imaging: ECHOCARDIOGRAM COMPLETE  Result Date: 07/03/2022    ECHOCARDIOGRAM REPORT   Patient Name:   Katelyn [name] Date of Exam: 07/03/2022 Medical Rec #:  [id number]        Height:       64.0 in Accession #:    [id number]        Weight:       150.0 lb Date of Birth:  08/30/1960         BSA:          1.731 m Patient Age:    61 years         BP:           124/59 mmHg Patient Gender: F                HR:           83 bpm. Exam Location:  [facility] Procedure: 2D Echo, Color Doppler and Cardiac Doppler Indications:     Other Cardiac Sounds R01.2  History:         Patient has prior history of Echocardiogram examinations. COPD;                  Risk Factors:Hypertension.  Sonographer:     L. Thornton-Maynard Referring Phys:  [id number] Boyce MediciAYE T GONFA Diagnosing Phys: Lennie OdorWesley O'Neal MD IMPRESSIONS  1. Left ventricular ejection fraction, by estimation, is 70 to 75%. The left ventricle has hyperdynamic function. The left ventricle has no regional wall motion abnormalities. Left ventricular diastolic parameters are consistent with Grade I diastolic dysfunction (impaired relaxation).  2. Right ventricular systolic function is normal. The right ventricular size is normal. There is mildly elevated pulmonary artery systolic pressure. The estimated right ventricular systolic pressure is 37.6 mmHg.  3. A small pericardial effusion is present. The pericardial effusion is anterior to the right ventricle. There is no evidence of cardiac tamponade.  4. The mitral  valve is grossly normal. No evidence of mitral valve regurgitation. No evidence of mitral stenosis.  5. The aortic valve is grossly normal. Aortic valve regurgitation is not visualized. No aortic stenosis is present.  6. The inferior vena cava is dilated in size with >50% respiratory variability, suggesting right atrial pressure of 8 mmHg. Comparison(s): No significant change from prior study. FINDINGS  Left Ventricle: Left ventricular ejection fraction, by estimation, is 70 to 75%. The left ventricle has hyperdynamic function. The left ventricle has no regional wall motion abnormalities. The left ventricular internal cavity size was normal in size. There is no left ventricular hypertrophy. Left ventricular  diastolic parameters are consistent with Grade I diastolic dysfunction (impaired relaxation). Right Ventricle: The right ventricular size is normal. No increase in right ventricular wall thickness. Right ventricular systolic function is normal. There is mildly elevated pulmonary artery systolic pressure. The tricuspid regurgitant velocity is 2.72  m/s, and with an assumed right atrial pressure of 8 mmHg, the estimated right ventricular systolic pressure is 37.6 mmHg. Left Atrium: Left atrial size was normal in size. Right Atrium: Right atrial size was normal in size. Pericardium: A small pericardial effusion is present. The pericardial effusion is anterior to the right ventricle. There is no evidence of cardiac tamponade. Presence of epicardial fat layer. Mitral Valve: The mitral valve is grossly normal. No evidence of mitral valve regurgitation. No evidence of mitral valve stenosis. MV peak gradient, 5.3 mmHg. The mean mitral valve gradient is 2.0 mmHg. Tricuspid Valve: The tricuspid valve is grossly normal. Tricuspid valve regurgitation is trivial. No evidence of tricuspid stenosis. Aortic Valve: The aortic valve is grossly normal. Aortic valve regurgitation is not visualized. No aortic stenosis is present. Aortic valve mean gradient measures 10.0 mmHg. Aortic valve peak gradient measures 18.1 mmHg. Aortic valve area, by VTI measures 1.91 cm. Pulmonic Valve: The pulmonic valve was grossly normal. Pulmonic valve regurgitation is not visualized. No evidence of pulmonic stenosis. Aorta: The aortic root and ascending aorta are structurally normal, with no evidence of dilitation. Venous: The inferior vena cava is dilated in size with greater than 50% respiratory variability, suggesting right atrial pressure of 8 mmHg. IAS/Shunts: The atrial septum is grossly normal.  LEFT VENTRICLE PLAX 2D LVIDd:         3.40 cm     Diastology LVIDs:         2.50 cm     LV e' medial:   6.36 cm/s LV PW:         1.10 cm     LV E/e'  medial: 14.1 LV IVS:        1.10 cm LVOT diam:     1.70 cm LV SV:         73 LV SV Index:   42 LVOT Area:     2.27 cm  LV Volumes (MOD) LV vol d, MOD A2C: 39.0 ml LV vol d, MOD A4C: 36.1 ml LV vol s, MOD A2C: 6.8 ml LV vol s, MOD A4C: 6.8 ml LV SV MOD A2C:     32.2 ml LV SV MOD A4C:     36.1 ml LV SV MOD BP:      31.7 ml RIGHT VENTRICLE             IVC RV S prime:     14.30 cm/s  IVC diam: 2.40 cm TAPSE (M-mode): 2.2 cm LEFT ATRIUM             Index  RIGHT ATRIUM          Index LA diam:        2.80 cm 1.62 cm/m   RA Area:     9.56 cm LA Vol (A2C):   26.5 ml 15.31 ml/m  RA Volume:   18.70 ml 10.80 ml/m LA Vol (A4C):   34.4 ml 19.87 ml/m LA Biplane Vol: 31.5 ml 18.20 ml/m  AORTIC VALVE                     PULMONIC VALVE AV Area (Vmax):    1.97 cm      PV Vmax:       1.06 m/s AV Area (Vmean):   1.82 cm      PV Peak grad:  4.5 mmHg AV Area (VTI):     1.91 cm AV Vmax:           212.50 cm/s AV Vmean:          150.000 cm/s AV VTI:            0.382 m AV Peak Grad:      18.1 mmHg AV Mean Grad:      10.0 mmHg LVOT Vmax:         184.50 cm/s LVOT Vmean:        120.000 cm/s LVOT VTI:          0.320 m LVOT/AV VTI ratio: 0.84  AORTA Ao Root diam: 3.20 cm Ao Asc diam:  3.50 cm MITRAL VALVE                TRICUSPID VALVE MV Area (PHT): 3.72 cm     TR Peak grad:   29.6 mmHg MV Area VTI:   3.01 cm     TR Vmax:        272.00 cm/s MV Peak grad:  5.3 mmHg MV Mean grad:  2.0 mmHg     SHUNTS MV Vmax:       1.15 m/s     Systemic VTI:  0.32 m MV Vmean:      74.1 cm/s    Systemic Diam: 1.70 cm MV Decel Time: 204 msec MV E velocity: 89.60 cm/s MV A velocity: 129.00 cm/s MV E/A ratio:  0.69 Lennie OdorWesley O'Neal MD Electronically signed by Lennie OdorWesley O'Neal MD Signature Date/Time: 07/03/2022/3:33:41 PM    Final    @PROBHOSP @ No results found.   ASSESSMENT AND PLAN    -Multidisciplinary rounds held today  Altered mental status with encephalopathy -Present on admission likely due to severe hyponatremia, investigate underlying  etiology and treat culprit of severe hyponatremia.  RN reporting that mentation has improved after initiation of vasopressor support.   -Additionally patient will have microbiology with blood cultures, urine culture and empiric therapy for possible septic encephalopathy. -Metabolic encephalopathy is on the differential due to severe electrolyte derangements aside from sodium including potassium, phosphorus, magnesium, calcium.   Hypoosmolar hypovolemic hyponatremia   - possible drug related - patient on fluoxetine, esomeprazole  and takes NSAIDS  - unclear EtOH history - patient does have transaminitis and this may be due to potomania   - urine osm is elevated will obtain urine Na.   -TSH and cortisol are normal    Circulatory shock -unclear etiology at this moment , she is receiving IVF  - this may be related to sepsis in lieu of elevated wbc count and we have ordered septic workup.  PRESENT ON ADMISSION -follow UO -continue Foley Catheter-assess need daily -On  levophed currently  -empirically on vancomycin and zosyn -MRSA PCR  ID -continue IV abx as prescibed -follow up cultures  GI/Nutrition GI PROPHYLAXIS as indicated DIET-->TF's as tolerated Constipation protocol as indicated  ENDO - ICU hypoglycemic\Hyperglycemia protocol -check FSBS per protocol   ELECTROLYTES -follow labs as needed -replace as needed -pharmacy consultation   DVT/GI PRX ordered -SCDs  TRANSFUSIONS AS NEEDED MONITOR FSBS ASSESS the need for LABS as needed     Vida RiggerFuad Nancie Bocanegra, M.D.  Pulmonary & Critical Care Medicine

## 2022-07-04 NOTE — Progress Notes (Signed)
The patient lives at home with her son, Katelyn [name] was set up for PT and OT on 06/24/22 at the last visit, it is reported that she has a RW at home Son takes to appointments and also does the grocery shopping No additional needs identified

## 2022-07-05 DIAGNOSIS — J432 Centrilobular emphysema: Secondary | ICD-10-CM | POA: Diagnosis not present

## 2022-07-05 DIAGNOSIS — I10 Essential (primary) hypertension: Secondary | ICD-10-CM | POA: Diagnosis not present

## 2022-07-05 DIAGNOSIS — K219 Gastro-esophageal reflux disease without esophagitis: Secondary | ICD-10-CM

## 2022-07-05 DIAGNOSIS — R7989 Other specified abnormal findings of blood chemistry: Secondary | ICD-10-CM | POA: Diagnosis not present

## 2022-07-05 DIAGNOSIS — E871 Hypo-osmolality and hyponatremia: Secondary | ICD-10-CM | POA: Diagnosis not present

## 2022-07-05 LAB — CBC
HCT: 30.3 % — ABNORMAL LOW (ref 36.0–46.0)
Hemoglobin: 10 g/dL — ABNORMAL LOW (ref 12.0–15.0)
MCH: 30 pg (ref 26.0–34.0)
MCHC: 33 g/dL (ref 30.0–36.0)
MCV: 91 fL (ref 80.0–100.0)
Platelets: 262 10*3/uL (ref 150–400)
RBC: 3.33 MIL/uL — ABNORMAL LOW (ref 3.87–5.11)
RDW: 13.2 % (ref 11.5–15.5)
WBC: 5.4 10*3/uL (ref 4.0–10.5)
nRBC: 0 % (ref 0.0–0.2)

## 2022-07-05 LAB — RENAL FUNCTION PANEL
Albumin: 2.6 g/dL — ABNORMAL LOW (ref 3.5–5.0)
Anion gap: 4 — ABNORMAL LOW (ref 5–15)
BUN: 6 mg/dL — ABNORMAL LOW (ref 8–23)
CO2: 28 mmol/L (ref 22–32)
Calcium: 7.9 mg/dL — ABNORMAL LOW (ref 8.9–10.3)
Chloride: 99 mmol/L (ref 98–111)
Creatinine, Ser: 0.61 mg/dL (ref 0.44–1.00)
GFR, Estimated: >60 mL/min (ref >60)
Glucose, Bld: 92 mg/dL (ref 70–99)
Phosphorus: 3.3 mg/dL (ref 2.5–4.6)
Potassium: 3.6 mmol/L (ref 3.5–5.1)
Sodium: 131 mmol/L — ABNORMAL LOW (ref 135–145)

## 2022-07-05 LAB — ACTH: C206 ACTH: 11.7 pg/mL (ref 7.2–63.3)

## 2022-07-05 LAB — MAGNESIUM: Magnesium: 1.7 mg/dL (ref 1.7–2.4)

## 2022-07-05 MED ORDER — GUAIFENESIN ER 600 MG PO TB12: 600.0000 mg | ORAL_TABLET | Freq: Two times a day (BID) | ORAL | 0 refills | Status: DC

## 2022-07-05 MED ORDER — NAPROXEN 500 MG PO TABS
500.0000 mg | ORAL_TABLET | Freq: Two times a day (BID) | ORAL | Status: DC
  Filled 2022-07-05: qty 1

## 2022-07-05 MED ORDER — ALBUTEROL SULFATE HFA 108 (90 BASE) MCG/ACT IN AERS: 2.0000 | INHALATION_SPRAY | Freq: Four times a day (QID) | RESPIRATORY_TRACT | 2 refills | Status: DC | PRN

## 2022-07-05 MED ORDER — POTASSIUM CHLORIDE CRYS ER 20 MEQ PO TBCR
40.0000 meq | EXTENDED_RELEASE_TABLET | Freq: Once | ORAL | Status: AC
  Administered 2022-07-05: 40 meq via ORAL
  Filled 2022-07-05: qty 2

## 2022-07-05 MED ORDER — MAGNESIUM SULFATE 2 GM/50ML IV SOLN
2.0000 g | Freq: Once | INTRAVENOUS | Status: DC
  Administered 2022-07-05: 2 g via INTRAVENOUS
  Filled 2022-07-05: qty 50

## 2022-07-05 MED ORDER — METOPROLOL SUCCINATE ER 50 MG PO TB24
100.0000 mg | ORAL_TABLET | Freq: Every day | ORAL | Status: DC
  Administered 2022-07-05: 100 mg via ORAL
  Filled 2022-07-05: qty 2

## 2022-07-05 NOTE — Plan of Care (Signed)
  Problem: Education: Goal: Knowledge of General Education information will improve Description: Including pain rating scale, medication(s)/side effects and non-pharmacologic comfort measures Outcome: Progressing   Problem: Clinical Measurements: Goal: Respiratory complications will improve Outcome: Progressing   Problem: Elimination: Goal: Will not experience complications related to urinary retention Outcome: Progressing   Problem: Skin Integrity: Goal: Risk for impaired skin integrity will decrease Outcome: Progressing   Problem: Safety: Goal: Ability to remain free from injury will improve Outcome: Progressing

## 2022-07-05 NOTE — Plan of Care (Signed)

## 2022-07-05 NOTE — Progress Notes (Signed)
Physical Therapy Treatment Patient Details Name: Katelyn [name] MRN: [id number] DOB: [birth year] Today's Date: 07/05/2022   History of Present Illness 62 y/o female presented to ED on 07/01/22 after vomitting and going unresponsive x 5 mins. Admitted for hyponatremia. Recent admission 1/1-06/24/22 for acute encephalopathy. PMH: anxiety, depression, HTN, tobacco abuse, COPD    PT Comments    Patient supine in bed on arrival, agreeable to limited PT tx session. Able to complete bed mobility modI and sit to stand transfer with supervision. Ambulated 25' with RW and supervision. Demos mild balance deficits and encouraged use of RW at home for safety and stability. Patient stating "I'll try but I'll be back here soon cause of how I'm feeling." D/c plan remains appropriate.     Recommendations for follow up therapy are one component of a multi-disciplinary discharge planning process, led by the attending physician.  Recommendations may be updated based on patient status, additional functional criteria and insurance authorization.  Follow Up Recommendations  Home health PT     Assistance Recommended at Discharge Intermittent Supervision/Assistance  Patient can return home with the following A little help with walking and/or transfers;Assistance with cooking/housework;Assist for transportation;Help with stairs or ramp for entrance;A little help with bathing/dressing/bathroom;Direct supervision/assist for medications management;Direct supervision/assist for financial management   Equipment Recommendations  Rolling Daysie Helf (2 wheels)    Recommendations for Other Services       Precautions / Restrictions Precautions Precautions: Fall Restrictions Weight Bearing Restrictions: No     Mobility  Bed Mobility Overal bed mobility: Modified Independent             General bed mobility comments: HOB elevated    Transfers Overall transfer level: Needs assistance Equipment used: Rolling Vandell Kun  (2 wheels) Transfers: Sit to/from Stand Sit to Stand: Supervision                Ambulation/Gait Ambulation/Gait assistance: Supervision Gait Distance (Feet): 25 Feet Assistive device: Rolling Domenique Southers (2 wheels) Gait Pattern/deviations: Step-through pattern, Decreased stride length Gait velocity: decreased         Stairs             Wheelchair Mobility    Modified Rankin (Stroke Patients Only)       Balance Overall balance assessment: Mild deficits observed, not formally tested                                          Cognition Arousal/Alertness: Awake/alert Behavior During Therapy: Flat affect Overall Cognitive Status: No family/caregiver present to determine baseline cognitive functioning Area of Impairment: Problem solving, Safety/judgement                         Safety/Judgement: Decreased awareness of deficits   Problem Solving: Slow processing          Exercises      General Comments        Pertinent Vitals/Pain Pain Assessment Pain Assessment: Faces Faces Pain Scale: Hurts little more Pain Location: back Pain Descriptors / Indicators: Aching, Discomfort Pain Intervention(s): Monitored during session    Home Living                          Prior Function            PT Goals (current goals can now be found in the  care plan section) Acute Rehab PT Goals PT Goal Formulation: With patient Time For Goal Achievement: 07/18/22 Potential to Achieve Goals: Good Progress towards PT goals: Progressing toward goals    Frequency    Min 2X/week      PT Plan Current plan remains appropriate    Co-evaluation              AM-PAC PT "6 Clicks" Mobility   Outcome Measure  Help needed turning from your back to your side while in a flat bed without using bedrails?: A Little Help needed moving from lying on your back to sitting on the side of a flat bed without using bedrails?: A  Little Help needed moving to and from a bed to a chair (including a wheelchair)?: A Little Help needed standing up from a chair using your arms (e.g., wheelchair or bedside chair)?: A Little Help needed to walk in hospital room?: A Little Help needed climbing 3-5 steps with a railing? : A Lot 6 Click Score: 17    End of Session   Activity Tolerance: Patient limited by pain Patient left: in bed;with call bell/phone within reach Nurse Communication: Mobility status PT Visit Diagnosis: Other abnormalities of gait and mobility (R26.89);Muscle weakness (generalized) (M62.81)     Time: 1030-1040 PT Time Calculation (min) (ACUTE ONLY): 10 min  Charges:  $Therapeutic Activity: 8-22 mins                     Zarin Knupp A. Gilford Rile PT, DPT [facility] - Acute Rehabilitation Services    Linna Hoff 07/05/2022, 12:33 PM

## 2022-07-05 NOTE — Plan of Care (Signed)

## 2022-07-05 NOTE — Discharge Summary (Signed)
Physician Discharge Summary  Elliot Dallyllen A Clinard ZOX:[id number] DOB: 02/06/1961 DOA: 07/01/2022  PCP: Alease MedinaZiglar, Susan K, MD  Admit date: 07/01/2022 Discharge date: 07/05/2022 Admitted From: Home Disposition: Home Recommendations for Outpatient Follow-up:  Follow up with PCP as below. Follow-up with urology as previously planned Check CMP and CBC at follow-up Patient is at risk for polypharmacy.  Review medications Please follow up on the following pending results: ACTH level  Home Health: HH PT/OT Equipment/Devices: None  Discharge Condition: Stable CODE STATUS: DNR/DNI  Follow-up Information     Ziglar, Eli PhillipsSusan K, MD. Go on 07/12/2022.   Specialty: Family Medicine Why: Appt @ 1:40 pm Contact information: [address] [place] [address] [phone]                 Hospital course 62 year old F with PMH of COPD, anxiety, depression, GERD, HTN, HLD, chronic pain, hyponatremia and tobacco use disorder presented to ED with nausea, vomiting, diarrhea and abdominal pain for about 5 days and admitted with significant electrolyte derangements. Na 104.  K2.2.  Mg 1.4. Cl 66.  WBC 17.6 with left shift.  EKG with QTc of 527.  CT abdomen and pelvis showed distended bladder despite Foley catheter with fullness of the collecting system.  CXR without acute finding.  Patient was started on IV NS, potassium and magnesium supplementation and admitted for severe hyponatremia and electrolyte derangement.   Patient had acute hypotension and encephalopathy the morning of 07/02/2021.  Encephalopathy workup including CT, ABG, ammonia and infectious workup unrevealing.  Concern about iatrogenic etiology.  She was started on Levophed by PIV.  PCCM consulted as well.   On 1/14, encephalopathy and hypotension resolved.  Hyponatremia improved.  She came off Levophed.  Patient continued to improve.  Remained hemodynamically stable.  Sodium improved to 131.  Other electrolyte issues resolved.  She is  discharged on p.o. sodium chloride 1 g twice daily.  ACTH level pending at the time of discharge.  HH PT/OT ordered as recommended by therapy.  See individual problem list below for more.   Problems addressed during this hospitalization Principal Problem:   Hyponatremia Active Problems:   Hypokalemia   Hypomagnesemia   Acute gastroenteritis   Essential hypertension   Acute encephalopathy   COPD (chronic obstructive pulmonary disease) (HCC)   Dyslipidemia   Anxiety and depression   GERD without esophagitis   Hypotension   Nausea vomiting and diarrhea   Elevated troponin   Severe hyponatremia: Hx of chronic hyponatremia. Na 104 on presentation and improved to 131  Urine sodium elevated suggesting SIADH.  However, sodium level improved with IV fluid. Random cortisol 9.3.  TSH normal. -Follow ACTH level -Continue p.o. sodium chloride 1 g twice daily -Recheck at follow-up   Hypokalemia/hypomagnesemia/hypochloremia/hypophosphatemia: Resolved.   Acute toxic metabolic encephalopathy: Likely iatrogenic from Xanax and Seroquel.  She says she has been out of her Xanax for a long time.  She is not sure if she could have taken home additional medications from home bottles.  She had her medication bottles at the bedside.  Encephalopathy workup including CT head, ABG, ammonia, B12, TSH and infectious workup unrevealing.  She has no focal neurodeficit.  Encephalopathy resolved.  Patient is at risk for polypharmacy.  Reassess at follow-up.   Hypotension/circulatory shock: Likely iatrogenic. Required Levophed 1/13-1/14.  Started on broad-spectrum antibiotics.  Infectious workup negative.  Antibiotics discontinued.  TTE without significant finding. -Resume home Toprol XL and amlodipine on discharge. -Discontinued clonidine.   Nausea/vomiting/diarrhea: Suspect viral gastroenteritis.  Resolved.   Elevated troponin: Likely demand ischemia.  Echocardiogram reassuring.  Patient has no chest pain.    Chest congestion/cough: Suspect viral URI.  COVID-19, influenza and RSV PCR nonreactive.  Improved. -Mucolytic's and antitussives as needed.   Anxiety/depression/chronic pain: Could be contributing to her fatigue -Discontinued Xanax.  She states she no longer take this. -Continue other medications -Discussed risk of polypharmacy as below   Urinary retention: Foley catheter placed on 1/10 by his urologist.  Plan is to continue Foley until she undergoes [facility] at [facility] -Continue Foley catheter. -Outpatient follow-up as previously planned   Chronic COPD: Stable.  ABG reassuring. -Inhalers and nebulizers -Encouraged smoking cessation.   Tongue ulcer: healing ulcers on underside of her tongue on both sides.  Unclear etiology of this.  Healing.   Generalized weakness/physical deconditioning: TSH within normal.  Echo without significant finding.  Cortisol low in her situation.   -Follow ACTH level -PT/OT           Vital signs Vitals:   07/04/22 0815 07/05/22 0150 07/05/22 0759 07/05/22 0824  BP: (!) 182/91 (!) 168/92  (!) 186/97  Pulse: 84 74  80  Temp: (!) 97.4 F (36.3 C) 97.8 F (36.6 C)  97.8 F (36.6 C)  Resp: 17 16  17   Height:      Weight:      SpO2: 99% 97% 97% 98%  TempSrc:  Oral    BMI (Calculated):         Discharge exam  GENERAL: No apparent distress.  Nontoxic. HEENT: MMM.  Vision and hearing grossly intact.  NECK: Supple.  No apparent JVD.  RESP:  No IWOB.  Fair aeration bilaterally. CVS:  RRR. Heart sounds normal.  ABD/GI/GU: BS+. Abd soft, NTND.  MSK/EXT:  Moves extremities. No apparent deformity. No edema.  SKIN: no apparent skin lesion or wound NEURO: Awake and alert. Oriented appropriately.  No apparent focal neuro deficit. PSYCH: Calm. Normal affect.  Discharge Instructions Discharge Instructions     Call MD for:  difficulty breathing, headache or visual disturbances   Complete by: As directed    Call MD for:  persistant dizziness or  light-headedness   Complete by: As directed    Call MD for:  persistant nausea and vomiting   Complete by: As directed    Call MD for:  temperature >100.4   Complete by: As directed    Diet general   Complete by: As directed    Discharge instructions   Complete by: As directed    It has been a pleasure taking care of you!  You were hospitalized due to significant electrolyte abnormalities likely from nausea, vomiting and diarrhea that seems to have resolved.  Taking medications as prescribed.  Be aware that combination of Flexeril, hydroxyzine, gabapentin and Seroquel can cause problems including drowsiness, sedation, constipation, impaired judgment, respiratory depression that could potentially lead to death and impaired balance that could increase risk of fall.  We strongly recommend cutting down on these medications or talking to your doctors who prescribe them.  You may start by cutting down on Flexeril and hydroxyzine since you have used them during this hospitalization.  We do not recommend driving, operating machinery or other activity that requires similar mental and physical engagement.   Follow-up with your primary care doctor in 1 to 2 weeks or sooner if needed.  Take care,   Increase activity slowly   Complete by: As directed       Allergies as of 07/05/2022  Reactions   Ciprofloxacin Other (See Comments)   Headache   Macrobid [nitrofurantoin Monohyd Macro] Itching   Sulfa Antibiotics Itching        Medication List     STOP taking these medications    ALPRAZolam 1 MG tablet Commonly known as: XANAX   cloNIDine 0.1 MG tablet Commonly known as: CATAPRES   predniSONE 20 MG tablet Commonly known as: DELTASONE       TAKE these medications    albuterol 108 (90 Base) MCG/ACT inhaler Commonly known as: VENTOLIN HFA Inhale 2 puffs into the lungs every 6 (six) hours as needed for wheezing or shortness of breath.   amLODipine 10 MG tablet Commonly known  as: NORVASC Take 1 tablet (10 mg total) by mouth daily.   atorvastatin 40 MG tablet Commonly known as: LIPITOR Take 40 mg by mouth daily.   benzonatate 100 MG capsule Commonly known as: TESSALON Take 1 capsule (100 mg total) by mouth 2 (two) times daily as needed for cough.   butalbital-acetaminophen-caffeine 50-325-40 MG tablet Commonly known as: FIORICET Take 1 tablet by mouth daily as needed for headache or migraine.   cyclobenzaprine 10 MG tablet Commonly known as: FLEXERIL Take 10 mg by mouth 3 (three) times daily as needed for muscle spasms. Notes to patient: Not  given in hospital   dicyclomine 10 MG capsule Commonly known as: BENTYL Take 1 capsule (10 mg total) by mouth 3 (three) times daily as needed for spasms.   esomeprazole 40 MG capsule Commonly known as: NEXIUM Take 40 mg by mouth daily at 12 noon.   Flovent HFA 220 MCG/ACT inhaler Generic drug: fluticasone Inhale 2 puffs into the lungs 2 (two) times daily.   FLUoxetine 10 MG capsule Commonly known as: PROZAC Take 10 mg by mouth daily.   gabapentin 300 MG capsule Commonly known as: Neurontin Take 1 capsule (300 mg total) by mouth 3 (three) times daily as needed (Pain).   guaiFENesin 600 MG 12 hr tablet Commonly known as: MUCINEX Take 1 tablet (600 mg total) by mouth 2 (two) times daily for 5 days.   hydrOXYzine 25 MG tablet Commonly known as: ATARAX Take 25 mg by mouth 3 (three) times daily as needed for anxiety.   metoprolol succinate 100 MG 24 hr tablet Commonly known as: TOPROL-XL Take 100 mg by mouth daily.   ondansetron 4 MG tablet Commonly known as: ZOFRAN Take 1 tablet (4 mg total) by mouth every 4 (four) hours as needed for nausea or vomiting.   polyethylene glycol 17 g packet Commonly known as: MIRALAX / GLYCOLAX Take 17 g by mouth daily.   QUEtiapine 100 MG tablet Commonly known as: SEROQUEL Take 100 mg by mouth 2 (two) times daily.   senna-docusate 8.6-50 MG tablet Commonly  known as: Senokot-S Take 2 tablets by mouth 2 (two) times daily.   sodium chloride 1 g tablet Take 1 tablet (1 g total) by mouth 2 (two) times daily with a meal.   Uribel 118 MG Caps Take 1 capsule (118 mg total) by mouth every 6 (six) hours as needed. What changed: reasons to take this   Vitamin D (Ergocalciferol) 1.25 MG (50000 UNIT) Caps capsule Commonly known as: DRISDOL Take 50,000 Units by mouth once a week.        Consultations: Pulmonology  Procedures/Studies:   ECHOCARDIOGRAM COMPLETE  Result Date: 07/03/2022    ECHOCARDIOGRAM REPORT   Patient Name:   DEJAE BERNET Hagner Date of Exam: 07/03/2022 Medical Rec #:  [id number]        Height:       64.0 in Accession #:    [id number]       Weight:       150.0 lb Date of Birth:  Dec 31, 1960         BSA:          1.731 m Patient Age:    61 years         BP:           124/59 mmHg Patient Gender: F                HR:           83 bpm. Exam Location:  [facility] Procedure: 2D Echo, Color Doppler and Cardiac Doppler Indications:     Other Cardiac Sounds R01.2  History:         Patient has prior history of Echocardiogram examinations. COPD;                  Risk Factors:Hypertension.  Sonographer:     L. Thornton-Maynard Referring Phys:  [id number] Boyce Medici Chrisean Kloth Diagnosing Phys: Lennie Odor MD IMPRESSIONS  1. Left ventricular ejection fraction, by estimation, is 70 to 75%. The left ventricle has hyperdynamic function. The left ventricle has no regional wall motion abnormalities. Left ventricular diastolic parameters are consistent with Grade I diastolic dysfunction (impaired relaxation).  2. Right ventricular systolic function is normal. The right ventricular size is normal. There is mildly elevated pulmonary artery systolic pressure. The estimated right ventricular systolic pressure is 37.6 mmHg.  3. A small pericardial effusion is present. The pericardial effusion is anterior to the right ventricle. There is no evidence of cardiac tamponade.  4. The mitral  valve is grossly normal. No evidence of mitral valve regurgitation. No evidence of mitral stenosis.  5. The aortic valve is grossly normal. Aortic valve regurgitation is not visualized. No aortic stenosis is present.  6. The inferior vena cava is dilated in size with >50% respiratory variability, suggesting right atrial pressure of 8 mmHg. Comparison(s): No significant change from prior study. FINDINGS  Left Ventricle: Left ventricular ejection fraction, by estimation, is 70 to 75%. The left ventricle has hyperdynamic function. The left ventricle has no regional wall motion abnormalities. The left ventricular internal cavity size was normal in size. There is no left ventricular hypertrophy. Left ventricular diastolic parameters are consistent with Grade I diastolic dysfunction (impaired relaxation). Right Ventricle: The right ventricular size is normal. No increase in right ventricular wall thickness. Right ventricular systolic function is normal. There is mildly elevated pulmonary artery systolic pressure. The tricuspid regurgitant velocity is 2.72  m/s, and with an assumed right atrial pressure of 8 mmHg, the estimated right ventricular systolic pressure is 37.6 mmHg. Left Atrium: Left atrial size was normal in size. Right Atrium: Right atrial size was normal in size. Pericardium: A small pericardial effusion is present. The pericardial effusion is anterior to the right ventricle. There is no evidence of cardiac tamponade. Presence of epicardial fat layer. Mitral Valve: The mitral valve is grossly normal. No evidence of mitral valve regurgitation. No evidence of mitral valve stenosis. MV peak gradient, 5.3 mmHg. The mean mitral valve gradient is 2.0 mmHg. Tricuspid Valve: The tricuspid valve is grossly normal. Tricuspid valve regurgitation is trivial. No evidence of tricuspid stenosis. Aortic Valve: The aortic valve is grossly normal. Aortic valve regurgitation is not visualized. No aortic stenosis is present.  Aortic valve mean gradient measures 10.0  mmHg. Aortic valve peak gradient measures 18.1 mmHg. Aortic valve area, by VTI measures 1.91 cm. Pulmonic Valve: The pulmonic valve was grossly normal. Pulmonic valve regurgitation is not visualized. No evidence of pulmonic stenosis. Aorta: The aortic root and ascending aorta are structurally normal, with no evidence of dilitation. Venous: The inferior vena cava is dilated in size with greater than 50% respiratory variability, suggesting right atrial pressure of 8 mmHg. IAS/Shunts: The atrial septum is grossly normal.  LEFT VENTRICLE PLAX 2D LVIDd:         3.40 cm     Diastology LVIDs:         2.50 cm     LV e' medial:   6.36 cm/s LV PW:         1.10 cm     LV E/e' medial: 14.1 LV IVS:        1.10 cm LVOT diam:     1.70 cm LV SV:         73 LV SV Index:   42 LVOT Area:     2.27 cm  LV Volumes (MOD) LV vol d, MOD A2C: 39.0 ml LV vol d, MOD A4C: 36.1 ml LV vol s, MOD A2C: 6.8 ml LV vol s, MOD A4C: 6.8 ml LV SV MOD A2C:     32.2 ml LV SV MOD A4C:     36.1 ml LV SV MOD BP:      31.7 ml RIGHT VENTRICLE             IVC RV S prime:     14.30 cm/s  IVC diam: 2.40 cm TAPSE (M-mode): 2.2 cm LEFT ATRIUM             Index        RIGHT ATRIUM          Index LA diam:        2.80 cm 1.62 cm/m   RA Area:     9.56 cm LA Vol (A2C):   26.5 ml 15.31 ml/m  RA Volume:   18.70 ml 10.80 ml/m LA Vol (A4C):   34.4 ml 19.87 ml/m LA Biplane Vol: 31.5 ml 18.20 ml/m  AORTIC VALVE                     PULMONIC VALVE AV Area (Vmax):    1.97 cm      PV Vmax:       1.06 m/s AV Area (Vmean):   1.82 cm      PV Peak grad:  4.5 mmHg AV Area (VTI):     1.91 cm AV Vmax:           212.50 cm/s AV Vmean:          150.000 cm/s AV VTI:            0.382 m AV Peak Grad:      18.1 mmHg AV Mean Grad:      10.0 mmHg LVOT Vmax:         184.50 cm/s LVOT Vmean:        120.000 cm/s LVOT VTI:          0.320 m LVOT/AV VTI ratio: 0.84  AORTA Ao Root diam: 3.20 cm Ao Asc diam:  3.50 cm MITRAL VALVE                TRICUSPID  VALVE MV Area (PHT): 3.72 cm     TR Peak grad:   29.6 mmHg MV  Area VTI:   3.01 cm     TR Vmax:        272.00 cm/s MV Peak grad:  5.3 mmHg MV Mean grad:  2.0 mmHg     SHUNTS MV Vmax:       1.15 m/s     Systemic VTI:  0.32 m MV Vmean:      74.1 cm/s    Systemic Diam: 1.70 cm MV Decel Time: 204 msec MV E velocity: 89.60 cm/s MV A velocity: 129.00 cm/s MV E/A ratio:  0.69 Lennie Odor MD Electronically signed by Lennie Odor MD Signature Date/Time: 07/03/2022/3:33:41 PM    Final    CT HEAD WO CONTRAST ( )  Result Date: 07/02/2022 CLINICAL DATA:  Mental status change, unknown cause EXAM: CT HEAD WITHOUT CONTRAST TECHNIQUE: Contiguous axial images were obtained from the base of the skull through the vertex without intravenous contrast. RADIATION DOSE REDUCTION: This exam was performed according to the departmental dose-optimization program which includes automated exposure control, adjustment of the mA and/or kV according to patient size and/or use of iterative reconstruction technique. COMPARISON:  07/01/2022. FINDINGS: Brain: No evidence of acute infarction, hemorrhage, hydrocephalus, extra-axial collection or mass lesion/mass effect. Vascular: No hyperdense vessel or unexpected calcification. Skull: Normal. Negative for fracture or focal lesion. Sinuses/Orbits: No acute finding. IMPRESSION: No acute intracranial process. Electronically Signed   By: Layla Maw M.D.   On: 07/02/2022 11:51   CT HEAD WO CONTRAST ( )  Result Date: 07/01/2022 CLINICAL DATA:  Recent syncopal episode and headaches, initial encounter EXAM: CT HEAD WITHOUT CONTRAST TECHNIQUE: Contiguous axial images were obtained from the base of the skull through the vertex without intravenous contrast. RADIATION DOSE REDUCTION: This exam was performed according to the departmental dose-optimization program which includes automated exposure control, adjustment of the mA and/or kV according to patient size and/or use of iterative  reconstruction technique. COMPARISON:  06/21/2022 FINDINGS: Brain: Examination is somewhat limited by patient motion artifact. No findings to suggest acute hemorrhage, acute infarction or space-occupying mass lesion are noted. Vascular: No hyperdense vessel or unexpected calcification. Skull: Normal. Negative for fracture or focal lesion. Sinuses/Orbits: No acute finding. Other: None. IMPRESSION: Somewhat limited exam due to patient motion artifact. No acute abnormality is noted. Electronically Signed   By: Alcide Clever M.D.   On: 07/01/2022 20:50   CT ABDOMEN PELVIS W CONTRAST  Result Date: 07/01/2022 CLINICAL DATA:  Acute abdominal pain and vomiting, initial encounter EXAM: CT ABDOMEN AND PELVIS WITH CONTRAST TECHNIQUE: Multidetector CT imaging of the abdomen and pelvis was performed using the standard protocol following bolus administration of intravenous contrast. RADIATION DOSE REDUCTION: This exam was performed according to the departmental dose-optimization program which includes automated exposure control, adjustment of the mA and/or kV according to patient size and/or use of iterative reconstruction technique. CONTRAST:  OMNIPAQUE IOHEXOL 300 MG/ML  SOLN COMPARISON:  06/19/2022 FINDINGS: Lower chest: Lung bases demonstrate diffuse emphysematous changes. Small pericardial effusion is noted new from the prior exam. Fluid is noted in the distal esophagus likely related to combination of hiatal hernia and reflux. Hepatobiliary: No focal liver abnormality is seen. Status post cholecystectomy. No biliary dilatation. Pancreas: Unremarkable. No pancreatic ductal dilatation or surrounding inflammatory changes. Spleen: Normal in size without focal abnormality. Adrenals/Urinary Tract: Adrenal glands are within normal limits. Kidneys demonstrate a normal enhancement pattern bilaterally. Mild fullness of the collecting systems and ureters is seen bilaterally likely related to a distended bladder as no mass or  stone is seen. The bladder  is well distended. A Foley catheter is noted in place however. Stomach/Bowel: No obstructive or inflammatory changes of colon are seen. Appendix is within normal limits. Small bowel is within normal limits. Stomach is distended with fluid. Vascular/Lymphatic: Aortic atherosclerosis. No enlarged abdominal or pelvic lymph nodes. Reproductive: Status post hysterectomy. No adnexal masses. Other: No abdominal wall hernia or abnormality. No abdominopelvic ascites. Musculoskeletal: Degenerative changes of lumbar spine are noted. Mild anterolisthesis of L4 on L5 is noted. IMPRESSION: Distention of the bladder despite a Foley catheter in place. This results in fullness of the collecting systems as well. New small pericardial effusion. Small hiatal hernia and reflux. Electronically Signed   By: Inez Catalina M.D.   On: 07/01/2022 20:47   DG Chest Portable 1 View  Result Date: 07/01/2022 CLINICAL DATA:  Vomiting and syncopal episode, initial encounter EXAM: PORTABLE CHEST 1 VIEW COMPARISON:  06/21/2022 FINDINGS: The heart size and mediastinal contours are within normal limits. Both lungs are clear. The visualized skeletal structures are unremarkable. IMPRESSION: No active disease. Electronically Signed   By: Inez Catalina M.D.   On: 07/01/2022 19:06   DG Chest Port 1 View  Result Date: 06/21/2022 CLINICAL DATA:  Altered mental status. EXAM: PORTABLE CHEST 1 VIEW COMPARISON:  08/11/2021 FINDINGS: 0609 hours. The cardiopericardial silhouette is within normal limits for size. The lungs are clear without focal pneumonia, edema, pneumothorax or pleural effusion. Interstitial markings are diffusely coarsened with chronic features. Telemetry leads overlie the chest. IMPRESSION: Chronic interstitial coarsening without acute cardiopulmonary findings. Electronically Signed   By: Misty Stanley M.D.   On: 06/21/2022 06:23   CT Head Wo Contrast  Result Date: 06/21/2022 CLINICAL DATA:  62 year old female  altered mental status. Combative. EXAM: CT HEAD WITHOUT CONTRAST TECHNIQUE: Contiguous axial images were obtained from the base of the skull through the vertex without intravenous contrast. RADIATION DOSE REDUCTION: This exam was performed according to the departmental dose-optimization program which includes automated exposure control, adjustment of the mA and/or kV according to patient size and/or use of iterative reconstruction technique. COMPARISON:  Brain MRI 11/14/2007.  Head CT 08/12/2021. FINDINGS: Brain: Normal cerebral volume. No midline shift, ventriculomegaly, mass effect, evidence of mass lesion, intracranial hemorrhage or evidence of cortically based acute infarction. Gray-white matter differentiation is within normal limits throughout the brain. Vascular: Calcified atherosclerosis at the skull base. No suspicious intracranial vascular hyperdensity. Skull: No acute osseous abnormality identified. Sinuses/Orbits: Visualized paranasal sinuses and mastoids are clear. Other: Broad-based right posterior scalp hematoma on series 3, image 42. Underlying calvarium intact. Other orbit and scalp soft tissues appear negative. IMPRESSION: 1. Right posterior scalp hematoma without underlying skull fracture. 2. Normal noncontrast CT appearance of the brain. Electronically Signed   By: Genevie Ann M.D.   On: 06/21/2022 05:27   CT Angio Chest PE W and/or Wo Contrast  Result Date: 06/19/2022 CLINICAL DATA:  Abdomen pain EXAM: CT ANGIOGRAPHY CHEST CT ABDOMEN AND PELVIS WITH CONTRAST TECHNIQUE: Multidetector CT imaging of the chest was performed using the standard protocol during bolus administration of intravenous contrast. Multiplanar CT image reconstructions and MIPs were obtained to evaluate the vascular anatomy. Multidetector CT imaging of the abdomen and pelvis was performed using the standard protocol during bolus administration of intravenous contrast. RADIATION DOSE REDUCTION: This exam was performed according  to the departmental dose-optimization program which includes automated exposure control, adjustment of the mA and/or kV according to patient size and/or use of iterative reconstruction technique. CONTRAST:  177mL OMNIPAQUE IOHEXOL 350 MG/ML SOLN COMPARISON:  CT 04/26/2022, 04/20/2022, chest CT 08/12/2021 FINDINGS: CTA CHEST FINDINGS Cardiovascular: Satisfactory opacification of the pulmonary arteries to the segmental level. No evidence of pulmonary embolism. Mild aortic atherosclerosis. No aneurysm. No dissection. Mild coronary vascular calcification. Normal cardiac size. No sizable pericardial effusion. Mediastinum/Nodes: Midline trachea. No thyroid mass. Multiple enlarged mediastinal and hilar nodes. Prevascular node measures 13 mm, series 4, image 36. Paratracheal nodes measuring up to 14 mm. Precarinal node measures 17 mm, series 4, image 59. Right inferior hilar node its measuring up to 17 mm, previously 14 mm. Subcarinal lymph node measures 18 mm. Esophagus within normal limits. Small hiatal hernia Lungs/Pleura: Emphysema. No acute airspace disease, pleural effusion, or pneumothorax. Musculoskeletal: Sternum is intact.  No acute osseous abnormality. Review of the MIP images confirms the above findings. CT ABDOMEN and PELVIS FINDINGS Hepatobiliary: Status post cholecystectomy. No focal hepatic abnormality or biliary dilatation. Pancreas: Unremarkable. No pancreatic ductal dilatation or surrounding inflammatory changes. Spleen: Normal in size without focal abnormality. Adrenals/Urinary Tract: Adrenal glands are unremarkable. Kidneys are normal, without renal calculi, focal lesion, or hydronephrosis. Bladder is decompressed by Foley catheter. Stomach/Bowel: Stomach is within normal limits. Appendix not well seen but no right lower quadrant inflammation. No evidence of bowel wall thickening, distention, or inflammatory changes. Vascular/Lymphatic: Advanced aortic atherosclerosis. No aneurysm. No suspicious lymph  nodes. Reproductive: Status post hysterectomy. No adnexal masses. Other: Negative for pelvic effusion or free air. Musculoskeletal: No acute osseous abnormality. Grade 1 anterolisthesis L4 on L5 with degenerative changes. Review of the MIP images confirms the above findings. IMPRESSION: 1. Negative for acute pulmonary embolus or aortic dissection. Emphysema without acute airspace disease. 2. No CT evidence for acute intra-abdominal or pelvic abnormality. 3. Slight interval increase in mediastinal and right hilar adenopathy compared to prior chest CT, correlate for any history of systemic or lymphoproliferative disease. 4. Aortic atherosclerosis. Aortic Atherosclerosis (ICD10-I70.0) and Emphysema (ICD10-J43.9). Electronically Signed   By: Jasmine Pang M.D.   On: 06/19/2022 17:21   CT ABDOMEN PELVIS W CONTRAST  Result Date: 06/19/2022 CLINICAL DATA:  Abdomen pain EXAM: CT ANGIOGRAPHY CHEST CT ABDOMEN AND PELVIS WITH CONTRAST TECHNIQUE: Multidetector CT imaging of the chest was performed using the standard protocol during bolus administration of intravenous contrast. Multiplanar CT image reconstructions and MIPs were obtained to evaluate the vascular anatomy. Multidetector CT imaging of the abdomen and pelvis was performed using the standard protocol during bolus administration of intravenous contrast. RADIATION DOSE REDUCTION: This exam was performed according to the departmental dose-optimization program which includes automated exposure control, adjustment of the mA and/or kV according to patient size and/or use of iterative reconstruction technique. CONTRAST:  OMNIPAQUE IOHEXOL 350 MG/ML SOLN COMPARISON:  CT 04/26/2022, 04/20/2022, chest CT 08/12/2021 FINDINGS: CTA CHEST FINDINGS Cardiovascular: Satisfactory opacification of the pulmonary arteries to the segmental level. No evidence of pulmonary embolism. Mild aortic atherosclerosis. No aneurysm. No dissection. Mild coronary vascular calcification.  Normal cardiac size. No sizable pericardial effusion. Mediastinum/Nodes: Midline trachea. No thyroid mass. Multiple enlarged mediastinal and hilar nodes. Prevascular node measures 13 mm, series 4, image 36. Paratracheal nodes measuring up to 14 mm. Precarinal node measures 17 mm, series 4, image 59. Right inferior hilar node its measuring up to 17 mm, previously 14 mm. Subcarinal lymph node measures 18 mm. Esophagus within normal limits. Small hiatal hernia Lungs/Pleura: Emphysema. No acute airspace disease, pleural effusion, or pneumothorax. Musculoskeletal: Sternum is intact.  No acute osseous abnormality. Review of the MIP images confirms the above findings. CT ABDOMEN and PELVIS FINDINGS  Hepatobiliary: Status post cholecystectomy. No focal hepatic abnormality or biliary dilatation. Pancreas: Unremarkable. No pancreatic ductal dilatation or surrounding inflammatory changes. Spleen: Normal in size without focal abnormality. Adrenals/Urinary Tract: Adrenal glands are unremarkable. Kidneys are normal, without renal calculi, focal lesion, or hydronephrosis. Bladder is decompressed by Foley catheter. Stomach/Bowel: Stomach is within normal limits. Appendix not well seen but no right lower quadrant inflammation. No evidence of bowel wall thickening, distention, or inflammatory changes. Vascular/Lymphatic: Advanced aortic atherosclerosis. No aneurysm. No suspicious lymph nodes. Reproductive: Status post hysterectomy. No adnexal masses. Other: Negative for pelvic effusion or free air. Musculoskeletal: No acute osseous abnormality. Grade 1 anterolisthesis L4 on L5 with degenerative changes. Review of the MIP images confirms the above findings. IMPRESSION: 1. Negative for acute pulmonary embolus or aortic dissection. Emphysema without acute airspace disease. 2. No CT evidence for acute intra-abdominal or pelvic abnormality. 3. Slight interval increase in mediastinal and right hilar adenopathy compared to prior chest CT,  correlate for any history of systemic or lymphoproliferative disease. 4. Aortic atherosclerosis. Aortic Atherosclerosis (ICD10-I70.0) and Emphysema (ICD10-J43.9). Electronically Signed   By: Jasmine PangKim  Fujinaga M.D.   On: 06/19/2022 17:21       The results of significant diagnostics from this hospitalization (including imaging, microbiology, ancillary and laboratory) are listed below for reference.     Microbiology: Recent Results (from the past 240 hour(s))  Resp panel by RT-PCR (RSV, Flu A&B, Covid) Nasopharyngeal Swab     Status: None   Collection Time: 07/01/22  7:36 PM   Specimen: Nasopharyngeal Swab; Nasal Swab  Result Value Ref Range Status   SARS Coronavirus 2 by RT PCR NEGATIVE NEGATIVE Final    Comment: (NOTE) SARS-CoV-2 target nucleic acids are NOT DETECTED.  The SARS-CoV-2 RNA is generally detectable in upper respiratory specimens during the acute phase of infection. The lowest concentration of SARS-CoV-2 viral copies this assay can detect is 138 copies/mL. A negative result does not preclude SARS-Cov-2 infection and should not be used as the sole basis for treatment or other patient management decisions. A negative result may occur with  improper specimen collection/handling, submission of specimen other than nasopharyngeal swab, presence of viral mutation(s) within the areas targeted by this assay, and inadequate number of viral copies(<138 copies/mL). A negative result must be combined with clinical observations, patient history, and epidemiological information. The expected result is Negative.  Fact Sheet for Patients:  BloggerCourse.comhttps://www.fda.gov/media/152166/download  Fact Sheet for Healthcare Providers:  SeriousBroker.ithttps://www.fda.gov/media/152162/download  This test is no t yet approved or cleared by the [place] FDA and  has been authorized for detection and/or diagnosis of SARS-CoV-2 by FDA under an Emergency Use Authorization (EUA). This EUA will remain  in effect (meaning  this test can be used) for the duration of the COVID-19 declaration under Section 564(b)(1) of the Act, 21 U.S.C.section 360bbb-3(b)(1), unless the authorization is terminated  or revoked sooner.       Influenza A by PCR NEGATIVE NEGATIVE Final   Influenza B by PCR NEGATIVE NEGATIVE Final    Comment: (NOTE) The Xpert Xpress SARS-CoV-2/FLU/RSV plus assay is intended as an aid in the diagnosis of influenza from Nasopharyngeal swab specimens and should not be used as a sole basis for treatment. Nasal washings and aspirates are unacceptable for Xpert Xpress SARS-CoV-2/FLU/RSV testing.  Fact Sheet for Patients: BloggerCourse.comhttps://www.fda.gov/media/152166/download  Fact Sheet for Healthcare Providers: SeriousBroker.ithttps://www.fda.gov/media/152162/download  This test is not yet approved or cleared by the [place] FDA and has been authorized for detection and/or diagnosis of SARS-CoV-2 by FDA  under an Emergency Use Authorization (EUA). This EUA will remain in effect (meaning this test can be used) for the duration of the COVID-19 declaration under Section 564(b)(1) of the Act, 21 U.S.C. section 360bbb-3(b)(1), unless the authorization is terminated or revoked.     Resp Syncytial Virus by PCR NEGATIVE NEGATIVE Final    Comment: (NOTE) Fact Sheet for Patients: BloggerCourse.com  Fact Sheet for Healthcare Providers: SeriousBroker.it  This test is not yet approved or cleared by the [place] FDA and has been authorized for detection and/or diagnosis of SARS-CoV-2 by FDA under an Emergency Use Authorization (EUA). This EUA will remain in effect (meaning this test can be used) for the duration of the COVID-19 declaration under Section 564(b)(1) of the Act, 21 U.S.C. section 360bbb-3(b)(1), unless the authorization is terminated or revoked.  Performed at [facility], [address] [place], [place] [address]   Blood culture (routine x  2)     Status: None (Preliminary result)   Collection Time: 07/01/22  7:36 PM   Specimen: BLOOD  Result Value Ref Range Status   Specimen Description BLOOD BLOOD RIGHT ARM  Final   Special Requests   Final    BOTTLES DRAWN AEROBIC AND ANAEROBIC Blood Culture adequate volume   Culture   Final    NO GROWTH 4 DAYS Performed at [facility], [address] [place], [place] [address]    Report Status PENDING  Incomplete  Blood culture (routine x 2)     Status: None (Preliminary result)   Collection Time: 07/01/22  7:36 PM   Specimen: BLOOD  Result Value Ref Range Status   Specimen Description BLOOD BLOOD RIGHT HAND  Final   Special Requests   Final    BOTTLES DRAWN AEROBIC AND ANAEROBIC Blood Culture adequate volume   Culture   Final    NO GROWTH 4 DAYS Performed at [facility], [address], [place], [place] [address]    Report Status PENDING  Incomplete  Urine Culture     Status: None   Collection Time: 07/01/22 11:54 PM   Specimen: Urine, Catheterized  Result Value Ref Range Status   Specimen Description   Final    URINE, CATHETERIZED Performed at [facility], [address], [place], [place] [address]    Special Requests   Final    Normal Performed at [facility], [address] [place], [place] [address]    Culture   Final    NO GROWTH Performed at [facility], [address], [place], [place] [address]    Report Status 07/03/2022 FINAL  Final  MRSA Next Gen by PCR, Nasal     Status: None   Collection Time: 07/02/22  8:00 PM   Specimen: Nasal Mucosa; Nasal Swab  Result Value Ref Range Status   MRSA by PCR Next Gen NOT DETECTED NOT DETECTED Final    Comment: (NOTE) The GeneXpert MRSA Assay (FDA approved for NASAL specimens only), is one component of a comprehensive MRSA colonization surveillance program. It is not intended to diagnose MRSA infection nor to guide or monitor treatment for MRSA infections. Test  performance is not FDA approved in patients less than 5 years old. Performed at [facility], [address] [place], [place] [address]      Labs:  CBC: Recent Labs  Lab 07/01/22 1859 07/02/22 0438 07/05/22 0235  WBC 17.6* 11.1* 5.4  NEUTROABS 13.8*  --   --   HGB 11.9*  11.0* 10.0*  HCT 32.7* 30.1* 30.3*  MCV 82.6 82.9 91.0  PLT 271 223 262   BMP &GFR Recent Labs  Lab 07/01/22 1859 07/01/22 2238 07/02/22 0139 07/02/22 0438 07/03/22 0313 07/03/22 0319 07/03/22 0853 07/03/22 1437 07/03/22 1952 07/04/22 0248 07/05/22 0235 07/05/22 0239  NA 104*   < >  --    < >  --    < > 127* 129* 129* 129*  --  131*  K 2.2*   < >  --    < >  --    < > 4.4 4.5 4.2 4.0  --  3.6  CL 66*   < >  --    < >  --    < > 98 100 98 97*  --  99  CO2 26   < >  --    < >  --    < > 25 25 26 26   --  28  GLUCOSE 136*   < >  --    < >  --    < > 103* 105* 111* 100*  --  92  BUN 8   < >  --    < >  --    < > <5* <5* 5* 6*  --  6*  CREATININE 0.50   < >  --    < >  --    < > 0.55 0.67 0.68 0.73  --  0.61  CALCIUM 7.1*   < >  --    < >  --    < > 7.4* 7.9* 7.9* 7.8*  --  7.9*  MG 1.4*  --  3.1*  --  2.1  --   --   --   --   --  1.7  --   PHOS  --   --   --    < >  --    < > 3.3 3.1 2.9 3.5  --  3.3   < > = values in this interval not displayed.   Estimated Creatinine Clearance: 69.1 mL/min (by C-G formula based on SCr of 0.61 mg/dL). Liver & Pancreas: Recent Labs  Lab 07/01/22 1859 07/02/22 0139 07/02/22 1118 07/02/22 1317 07/03/22 0853 07/03/22 1437 07/03/22 1952 07/04/22 0248 07/05/22 0239  AST 42*  --  25  --   --   --   --   --   --   ALT 53*  --  37  --   --   --   --   --   --   ALKPHOS 54  --  44  --   --   --   --   --   --   BILITOT 0.9  --  0.6  --   --   --   --   --   --   PROT 6.0*  --  4.6*  --   --   --   --   --   --   ALBUMIN 3.1*   < > 2.3*   < > 2.6* 2.6* 2.8* 2.5* 2.6*   < > = values in this interval not displayed.   Recent Labs  Lab 07/01/22 1859   LIPASE 28   Recent Labs  Lab 07/02/22 1115  AMMONIA 18   Diabetic: No results for input(s): "HGBA1C" in the last 72 hours. Recent Labs  Lab 07/02/22 1444 07/02/22 1955  GLUCAP 104* 117*   Cardiac Enzymes: No results  for input(s): "CKTOTAL", "CKMB", "CKMBINDEX", "TROPONINI" in the last 168 hours. No results for input(s): "PROBNP" in the last 8760 hours. Coagulation Profile: No results for input(s): "INR", "PROTIME" in the last 168 hours. Thyroid Function Tests: No results for input(s): "TSH", "T4TOTAL", "FREET4", "T3FREE", "THYROIDAB" in the last 72 hours. Lipid Profile: No results for input(s): "CHOL", "HDL", "LDLCALC", "TRIG", "CHOLHDL", "LDLDIRECT" in the last 72 hours. Anemia Panel: No results for input(s): "VITAMINB12", "FOLATE", "FERRITIN", "TIBC", "IRON", "RETICCTPCT" in the last 72 hours. Urine analysis:    Component Value Date/Time   COLORURINE COLORLESS (A) 07/01/2022 2354   APPEARANCEUR CLEAR (A) 07/01/2022 2354   APPEARANCEUR Clear 05/24/2022 1536   LABSPEC 1.005 07/01/2022 2354   PHURINE 7.0 07/01/2022 2354   GLUCOSEU NEGATIVE 07/01/2022 2354   HGBUR SMALL (A) 07/01/2022 2354   BILIRUBINUR NEGATIVE 07/01/2022 2354   BILIRUBINUR Negative 05/24/2022 1536   KETONESUR 5 (A) 07/01/2022 2354   PROTEINUR NEGATIVE 07/01/2022 2354   UROBILINOGEN 0.2 11/13/2007 2030   NITRITE NEGATIVE 07/01/2022 2354   LEUKOCYTESUR TRACE (A) 07/01/2022 2354   Sepsis Labs: Invalid input(s): "PROCALCITONIN", "LACTICIDVEN"   SIGNED:  Almon Herculesaye T Mansour Balboa, MD  [facility] 07/05/2022, 3:07 PM

## 2022-07-05 NOTE — Consult Note (Signed)
PHARMACY CONSULT NOTE - FOLLOW UP  Pharmacy Consult for Electrolyte Monitoring and Replacement   Recent Labs: Potassium (mmol/L)  Date Value  07/05/2022 3.6  10/09/2012 4.1   Magnesium (mg/dL)  Date Value  07/05/2022 1.7   Calcium (mg/dL)  Date Value  07/05/2022 7.9 (L)   Calcium, Total (mg/dL)  Date Value  10/09/2012 8.9   Albumin (g/dL)  Date Value  07/05/2022 2.6 (L)   Phosphorus (mg/dL)  Date Value  07/05/2022 3.3   Sodium (mmol/L)  Date Value  07/05/2022 131 (L)  10/09/2012 132 (L)   Corrected Ca: 9.0  Assessment: 62 year old female with a history of anxiety and depression, essential hypertension, GERD, dyslipidemia COPD with active tobacco abuse who came into the ED with altered mental status and diarrhea. Found to be lethargic and with severe hyponatremia with a sodium of 104. Pharmacy has been consulted to monitor and replace electrolytes as needed.   Pertinent medications: PO NaCl 1 gram tabs: 1 gram BID  Goal of Therapy:  Electrolytes WNL  Plan:  Sodium: Na 104-->114>107-->129>127>129>131: Patient on NaCl 1 gram tablets BID (ordered by provider), nephrology consulted.  Calcium: 7.8, corrected Ca 9.0: No further correction warranted Phosphorous: 3.3: No further correction warranted Potassium: >2.0 >> 4.4 >> 4.0>>3.6: Kcl 40 mEq x 1 (ordered by provider). No further correction warranted Magnesium: 2.1>>1.7: Give magnesium sulfate 2 grams IV x 1 Daily renal function panel ordered by provider Follow up with AM labs   Beatris Si, PharmD 07/05/2022 7:18 AM

## 2022-07-05 NOTE — Progress Notes (Signed)
Discharge instructions reviewed with pt. Pt verbalized understanding of instructions. Will be transported home via family car

## 2022-07-06 ENCOUNTER — Inpatient Hospital Stay (HOSPITAL_COMMUNITY)
Admission: EM | Admit: 2022-07-06 | Discharge: 2022-07-14 | DRG: 644 | Disposition: A | Discharge status: Home Health | Payer: Medicaid Other | Attending: Internal Medicine | Admitting: Internal Medicine

## 2022-07-06 ENCOUNTER — Encounter (HOSPITAL_COMMUNITY): Payer: Self-pay | Admitting: Emergency Medicine

## 2022-07-06 ENCOUNTER — Other Ambulatory Visit: Payer: Self-pay

## 2022-07-06 ENCOUNTER — Emergency Department (HOSPITAL_COMMUNITY): Payer: Medicaid Other

## 2022-07-06 DIAGNOSIS — R338 Other retention of urine: Secondary | ICD-10-CM | POA: Diagnosis present

## 2022-07-06 DIAGNOSIS — D649 Anemia, unspecified: Secondary | ICD-10-CM | POA: Diagnosis present

## 2022-07-06 DIAGNOSIS — F17210 Nicotine dependence, cigarettes, uncomplicated: Secondary | ICD-10-CM | POA: Diagnosis present

## 2022-07-06 DIAGNOSIS — I5032 Chronic diastolic (congestive) heart failure: Secondary | ICD-10-CM | POA: Diagnosis present

## 2022-07-06 DIAGNOSIS — J432 Centrilobular emphysema: Secondary | ICD-10-CM | POA: Diagnosis not present

## 2022-07-06 DIAGNOSIS — Z841 Family history of disorders of kidney and ureter: Secondary | ICD-10-CM

## 2022-07-06 DIAGNOSIS — N1330 Unspecified hydronephrosis: Secondary | ICD-10-CM | POA: Diagnosis present

## 2022-07-06 DIAGNOSIS — F419 Anxiety disorder, unspecified: Secondary | ICD-10-CM | POA: Diagnosis not present

## 2022-07-06 DIAGNOSIS — E860 Dehydration: Secondary | ICD-10-CM | POA: Diagnosis present

## 2022-07-06 DIAGNOSIS — A084 Viral intestinal infection, unspecified: Secondary | ICD-10-CM | POA: Diagnosis present

## 2022-07-06 DIAGNOSIS — E871 Hypo-osmolality and hyponatremia: Principal | ICD-10-CM | POA: Diagnosis present

## 2022-07-06 DIAGNOSIS — K589 Irritable bowel syndrome without diarrhea: Secondary | ICD-10-CM | POA: Diagnosis present

## 2022-07-06 DIAGNOSIS — J439 Emphysema, unspecified: Secondary | ICD-10-CM | POA: Diagnosis present

## 2022-07-06 DIAGNOSIS — E785 Hyperlipidemia, unspecified: Secondary | ICD-10-CM | POA: Diagnosis present

## 2022-07-06 DIAGNOSIS — E222 Syndrome of inappropriate secretion of antidiuretic hormone: Principal | ICD-10-CM | POA: Diagnosis present

## 2022-07-06 DIAGNOSIS — J449 Chronic obstructive pulmonary disease, unspecified: Secondary | ICD-10-CM | POA: Diagnosis present

## 2022-07-06 DIAGNOSIS — Z66 Do not resuscitate: Secondary | ICD-10-CM | POA: Diagnosis present

## 2022-07-06 DIAGNOSIS — K219 Gastro-esophageal reflux disease without esophagitis: Secondary | ICD-10-CM | POA: Diagnosis present

## 2022-07-06 DIAGNOSIS — N3281 Overactive bladder: Secondary | ICD-10-CM | POA: Diagnosis present

## 2022-07-06 DIAGNOSIS — Z79899 Other long term (current) drug therapy: Secondary | ICD-10-CM

## 2022-07-06 DIAGNOSIS — H5462 Unqualified visual loss, left eye, normal vision right eye: Secondary | ICD-10-CM | POA: Diagnosis present

## 2022-07-06 DIAGNOSIS — I10 Essential (primary) hypertension: Secondary | ICD-10-CM | POA: Diagnosis present

## 2022-07-06 DIAGNOSIS — G43909 Migraine, unspecified, not intractable, without status migrainosus: Secondary | ICD-10-CM | POA: Diagnosis present

## 2022-07-06 DIAGNOSIS — Z882 Allergy status to sulfonamides status: Secondary | ICD-10-CM

## 2022-07-06 DIAGNOSIS — G8929 Other chronic pain: Secondary | ICD-10-CM | POA: Diagnosis present

## 2022-07-06 DIAGNOSIS — Z8249 Family history of ischemic heart disease and other diseases of the circulatory system: Secondary | ICD-10-CM

## 2022-07-06 DIAGNOSIS — I110 Hypertensive heart disease with heart failure: Secondary | ICD-10-CM | POA: Diagnosis present

## 2022-07-06 DIAGNOSIS — F32A Depression, unspecified: Secondary | ICD-10-CM | POA: Diagnosis present

## 2022-07-06 DIAGNOSIS — Z888 Allergy status to other drugs, medicaments and biological substances status: Secondary | ICD-10-CM

## 2022-07-06 DIAGNOSIS — E876 Hypokalemia: Secondary | ICD-10-CM | POA: Diagnosis present

## 2022-07-06 DIAGNOSIS — E7800 Pure hypercholesterolemia, unspecified: Secondary | ICD-10-CM

## 2022-07-06 LAB — COMPREHENSIVE METABOLIC PANEL
ALT: 27 U/L (ref 0–44)
AST: 29 U/L (ref 15–41)
Albumin: 3.4 g/dL — ABNORMAL LOW (ref 3.5–5.0)
Alkaline Phosphatase: 69 U/L (ref 38–126)
Anion gap: 11 (ref 5–15)
BUN: <5 mg/dL — ABNORMAL LOW (ref 8–23)
CO2: 23 mmol/L (ref 22–32)
Calcium: 8.7 mg/dL — ABNORMAL LOW (ref 8.9–10.3)
Chloride: 80 mmol/L — ABNORMAL LOW (ref 98–111)
Creatinine, Ser: 0.42 mg/dL — ABNORMAL LOW (ref 0.44–1.00)
GFR, Estimated: >60 mL/min (ref >60)
Glucose, Bld: 109 mg/dL — ABNORMAL HIGH (ref 70–99)
Potassium: 3.4 mmol/L — ABNORMAL LOW (ref 3.5–5.1)
Sodium: 114 mmol/L — CL (ref 135–145)
Total Bilirubin: 0.3 mg/dL (ref 0.3–1.2)
Total Protein: 6.8 g/dL (ref 6.5–8.1)

## 2022-07-06 LAB — BASIC METABOLIC PANEL
Anion gap: 10 (ref 5–15)
Anion gap: 11 (ref 5–15)
BUN: <5 mg/dL — ABNORMAL LOW (ref 8–23)
BUN: <5 mg/dL — ABNORMAL LOW (ref 8–23)
CO2: 25 mmol/L (ref 22–32)
CO2: 21 mmol/L — ABNORMAL LOW (ref 22–32)
Calcium: 8.7 mg/dL — ABNORMAL LOW (ref 8.9–10.3)
Calcium: 8.2 mg/dL — ABNORMAL LOW (ref 8.9–10.3)
Chloride: 89 mmol/L — ABNORMAL LOW (ref 98–111)
Chloride: 93 mmol/L — ABNORMAL LOW (ref 98–111)
Creatinine, Ser: 0.45 mg/dL (ref 0.44–1.00)
Creatinine, Ser: 0.64 mg/dL (ref 0.44–1.00)
GFR, Estimated: >60 mL/min (ref >60)
GFR, Estimated: >60 mL/min (ref >60)
Glucose, Bld: 92 mg/dL (ref 70–99)
Glucose, Bld: 95 mg/dL (ref 70–99)
Potassium: 4 mmol/L (ref 3.5–5.1)
Potassium: 3.6 mmol/L (ref 3.5–5.1)
Sodium: 124 mmol/L — ABNORMAL LOW (ref 135–145)
Sodium: 125 mmol/L — ABNORMAL LOW (ref 135–145)

## 2022-07-06 LAB — CBC WITH DIFFERENTIAL/PLATELET
Abs Immature Granulocytes: 0.05 10*3/uL (ref 0.00–0.07)
Basophils Absolute: 0 10*3/uL (ref 0.0–0.1)
Basophils Relative: 0 %
Eosinophils Absolute: 0 10*3/uL (ref 0.0–0.5)
Eosinophils Relative: 0 %
HCT: 35.6 % — ABNORMAL LOW (ref 36.0–46.0)
Hemoglobin: 12.7 g/dL (ref 12.0–15.0)
Immature Granulocytes: 1 %
Lymphocytes Relative: 14 %
Lymphs Abs: 1.4 10*3/uL (ref 0.7–4.0)
MCH: 30.5 pg (ref 26.0–34.0)
MCHC: 35.7 g/dL (ref 30.0–36.0)
MCV: 85.6 fL (ref 80.0–100.0)
Monocytes Absolute: 0.7 10*3/uL (ref 0.1–1.0)
Monocytes Relative: 7 %
Neutro Abs: 7.8 10*3/uL — ABNORMAL HIGH (ref 1.7–7.7)
Neutrophils Relative %: 78 %
Platelets: 312 10*3/uL (ref 150–400)
RBC: 4.16 MIL/uL (ref 3.87–5.11)
RDW: 12.3 % (ref 11.5–15.5)
WBC: 10 10*3/uL (ref 4.0–10.5)
nRBC: 0 % (ref 0.0–0.2)

## 2022-07-06 LAB — URINALYSIS, ROUTINE W REFLEX MICROSCOPIC
Bilirubin Urine: NEGATIVE
Glucose, UA: NEGATIVE mg/dL
Ketones, ur: NEGATIVE mg/dL
Leukocytes,Ua: NEGATIVE
Nitrite: NEGATIVE
Protein, ur: NEGATIVE mg/dL
Specific Gravity, Urine: 1.001 — ABNORMAL LOW (ref 1.005–1.030)
pH: 8 (ref 5.0–8.0)

## 2022-07-06 LAB — CULTURE, BLOOD (ROUTINE X 2)
Culture: NO GROWTH
Culture: NO GROWTH
Special Requests: ADEQUATE
Special Requests: ADEQUATE

## 2022-07-06 LAB — MAGNESIUM: Magnesium: 1.8 mg/dL (ref 1.7–2.4)

## 2022-07-06 LAB — LIPASE, BLOOD: Lipase: 26 U/L (ref 11–51)

## 2022-07-06 MED ORDER — AMLODIPINE BESYLATE 10 MG PO TABS
10.0000 mg | ORAL_TABLET | Freq: Every day | ORAL | Status: DC
  Administered 2022-07-07 – 2022-07-14 (×8): 10 mg via ORAL
  Filled 2022-07-06 (×8): qty 1

## 2022-07-06 MED ORDER — SODIUM CHLORIDE 0.9 % IV BOLUS
1000.0000 mL | Freq: Once | INTRAVENOUS | Status: AC
  Administered 2022-07-06: 1000 mL via INTRAVENOUS

## 2022-07-06 MED ORDER — SODIUM CHLORIDE 0.9% FLUSH
3.0000 mL | Freq: Two times a day (BID) | INTRAVENOUS | Status: DC
  Administered 2022-07-07 – 2022-07-14 (×12): 3 mL via INTRAVENOUS

## 2022-07-06 MED ORDER — HYDROXYZINE HCL 25 MG PO TABS
25.0000 mg | ORAL_TABLET | Freq: Three times a day (TID) | ORAL | Status: DC | PRN
  Administered 2022-07-06 – 2022-07-13 (×8): 25 mg via ORAL
  Filled 2022-07-06 (×8): qty 1

## 2022-07-06 MED ORDER — ACETAMINOPHEN 325 MG PO TABS
650.0000 mg | ORAL_TABLET | Freq: Four times a day (QID) | ORAL | Status: DC | PRN
  Administered 2022-07-06 – 2022-07-08 (×3): 650 mg via ORAL
  Filled 2022-07-06 (×3): qty 2

## 2022-07-06 MED ORDER — ALBUTEROL SULFATE (2.5 MG/3ML) 0.083% IN NEBU: 2.5000 mg | INHALATION_SOLUTION | Freq: Four times a day (QID) | RESPIRATORY_TRACT | Status: DC | PRN

## 2022-07-06 MED ORDER — POLYETHYLENE GLYCOL 3350 17 G PO PACK: 17.0000 g | PACK | Freq: Every day | ORAL | Status: DC | PRN

## 2022-07-06 MED ORDER — LABETALOL HCL 5 MG/ML IV SOLN
5.0000 mg | INTRAVENOUS | Status: DC | PRN
  Administered 2022-07-06 – 2022-07-09 (×2): 5 mg via INTRAVENOUS
  Filled 2022-07-06 (×2): qty 4

## 2022-07-06 MED ORDER — SODIUM CHLORIDE 0.9 % IV SOLN: INTRAVENOUS | Status: AC

## 2022-07-06 MED ORDER — IOHEXOL 350 MG/ML SOLN
75.0000 mL | Freq: Once | INTRAVENOUS | Status: AC | PRN
  Administered 2022-07-06: 75 mL via INTRAVENOUS

## 2022-07-06 MED ORDER — FLUTICASONE PROPIONATE HFA 220 MCG/ACT IN AERO: 2.0000 | INHALATION_SPRAY | Freq: Two times a day (BID) | RESPIRATORY_TRACT | Status: DC

## 2022-07-06 MED ORDER — ACETAMINOPHEN 650 MG RE SUPP: 650.0000 mg | Freq: Four times a day (QID) | RECTAL | Status: DC | PRN

## 2022-07-06 MED ORDER — METOPROLOL SUCCINATE ER 100 MG PO TB24
100.0000 mg | ORAL_TABLET | Freq: Every day | ORAL | Status: DC
  Administered 2022-07-07 – 2022-07-14 (×8): 100 mg via ORAL
  Filled 2022-07-06 (×8): qty 1

## 2022-07-06 MED ORDER — DICYCLOMINE HCL 10 MG PO CAPS
10.0000 mg | ORAL_CAPSULE | Freq: Three times a day (TID) | ORAL | Status: DC | PRN
  Administered 2022-07-06 – 2022-07-14 (×6): 10 mg via ORAL
  Filled 2022-07-06 (×10): qty 1

## 2022-07-06 MED ORDER — ALBUTEROL SULFATE HFA 108 (90 BASE) MCG/ACT IN AERS: 2.0000 | INHALATION_SPRAY | Freq: Four times a day (QID) | RESPIRATORY_TRACT | Status: DC | PRN

## 2022-07-06 MED ORDER — BUDESONIDE 0.25 MG/2ML IN SUSP
0.2500 mg | Freq: Two times a day (BID) | RESPIRATORY_TRACT | Status: DC
  Administered 2022-07-06 – 2022-07-14 (×11): 0.25 mg via RESPIRATORY_TRACT
  Filled 2022-07-06 (×21): qty 2

## 2022-07-06 MED ORDER — ENOXAPARIN SODIUM 40 MG/0.4ML IJ SOSY
40.0000 mg | PREFILLED_SYRINGE | INTRAMUSCULAR | Status: DC
  Administered 2022-07-06 – 2022-07-14 (×9): 40 mg via SUBCUTANEOUS
  Filled 2022-07-06 (×9): qty 0.4

## 2022-07-06 MED ORDER — ATORVASTATIN CALCIUM 40 MG PO TABS
40.0000 mg | ORAL_TABLET | Freq: Every day | ORAL | Status: DC
  Administered 2022-07-07 – 2022-07-14 (×8): 40 mg via ORAL
  Filled 2022-07-06 (×8): qty 1

## 2022-07-06 MED ORDER — CHLORHEXIDINE GLUCONATE CLOTH 2 % EX PADS
6.0000 | MEDICATED_PAD | Freq: Every day | CUTANEOUS | Status: DC
  Administered 2022-07-06 – 2022-07-14 (×7): 6 via TOPICAL

## 2022-07-06 MED ORDER — POTASSIUM CHLORIDE 20 MEQ PO PACK
40.0000 meq | PACK | Freq: Once | ORAL | Status: AC
  Administered 2022-07-06: 40 meq via ORAL
  Filled 2022-07-06: qty 2

## 2022-07-06 NOTE — ED Provider Notes (Addendum)
[facility] EMERGENCY DEPARTMENT Provider Note   CSN: [id number] Arrival date & time: 07/06/22  0340     History  Chief Complaint  Patient presents with   Abdominal Pain   Emesis    Katelyn [name] is a 62 y.o. female.  HPI Elderly female presents 1 day after being discharged from local facility now with recurrence of abdominal pain, nausea, vomiting, p.o. intolerance.  She notes that she was hospitalized for abdominal pain, had hyponatremia, but on discharge was slightly improved, however, with ongoing pain in the right lower abdomen. No fever, no confusion.    Home Medications Prior to Admission medications   Medication Sig Start Date End Date Taking? Authorizing Provider  albuterol (VENTOLIN HFA) 108 (90 Base) MCG/ACT inhaler Inhale 2 puffs into the lungs every 6 (six) hours as needed for wheezing or shortness of breath. 07/05/22   Mercy Riding, MD  amLODipine (NORVASC) 10 MG tablet Take 1 tablet (10 mg total) by mouth daily. 04/15/21   Kate Sable, MD  atorvastatin (LIPITOR) 40 MG tablet Take 40 mg by mouth daily. 01/01/15   [provider]  benzonatate (TESSALON) 100 MG capsule Take 1 capsule (100 mg total) by mouth 2 (two) times daily as needed for cough. 06/24/22   Bonnielee Haff, MD  butalbital-acetaminophen-caffeine (FIORICET) [phone] MG tablet Take 1 tablet by mouth daily as needed for headache or migraine. 04/03/19   [provider]  cyclobenzaprine (FLEXERIL) 10 MG tablet Take 10 mg by mouth 3 (three) times daily as needed for muscle spasms. 03/25/22   [provider]  dicyclomine (BENTYL) 10 MG capsule Take 1 capsule (10 mg total) by mouth 3 (three) times daily as needed for spasms. 03/03/22   Harvest Dark, MD  esomeprazole (NEXIUM) 40 MG capsule Take 40 mg by mouth daily at 12 noon.    [provider]  FLOVENT HFA 220 MCG/ACT inhaler Inhale 2 puffs into the lungs 2 (two) times daily. 08/15/20   [provider]  FLUoxetine (PROZAC) 10 MG capsule Take 10 mg by mouth daily.    [provider]  gabapentin (NEURONTIN) 300 MG capsule Take 1 capsule (300 mg total) by mouth 3 (three) times daily as needed (Pain). 06/19/22 06/19/23  Nance Pear, MD  guaiFENesin (MUCINEX) 600 MG 12 hr tablet Take 1 tablet (600 mg total) by mouth 2 (two) times daily for 5 days. 07/05/22 07/10/22  Mercy Riding, MD  hydrOXYzine (ATARAX) 25 MG tablet Take 25 mg by mouth 3 (three) times daily as needed for anxiety.    [provider]  Meth-Hyo-M Bl-Na Phos-Ph Sal (URIBEL) 118 MG CAPS Take 1 capsule (118 mg total) by mouth every 6 (six) hours as needed. Patient taking differently: Take 1 capsule by mouth every 6 (six) hours as needed (pain). 05/24/22   Zara Council A, PA-C  metoprolol succinate (TOPROL-XL) 100 MG 24 hr tablet Take 100 mg by mouth daily. 01/28/22   [provider]  ondansetron (ZOFRAN) 4 MG tablet Take 1 tablet (4 mg total) by mouth every 4 (four) hours as needed for nausea or vomiting. 04/20/22   Jeanell Sparrow, DO  polyethylene glycol (MIRALAX / GLYCOLAX) 17 g packet Take 17 g by mouth daily. 06/24/22   Bonnielee Haff, MD  QUEtiapine (SEROQUEL) 100 MG tablet Take 100 mg by mouth 2 (two) times daily. 02/18/22   [provider]  senna-docusate (SENOKOT-S) 8.6-50 MG tablet Take 2 tablets by mouth 2 (two) times daily.  06/24/22   Osvaldo Shipper, MD  sodium chloride 1 g tablet Take 1 tablet (1 g total) by mouth 2 (two) times daily with a meal. 06/24/22   Osvaldo Shipper, MD  Vitamin D, Ergocalciferol, (DRISDOL) 1.25 MG (50000 UNIT) CAPS capsule Take 50,000 Units by mouth once a week. 03/13/22   [provider]      Allergies    Ciprofloxacin, Macrobid [nitrofurantoin monohyd macro], and Sulfa antibiotics    Review of Systems   Review of Systems  All other systems reviewed and are negative.   Physical Exam Updated Vital Signs BP (!) 167/86 (BP Location: Right  Arm)   Pulse 64   Temp (!) 97.5 F (36.4 C) (Oral)   Resp 18   Ht 5\' 4"  (1.626 m)   Wt 68 kg   SpO2 100%   BMI 25.75 kg/m  Physical Exam Vitals and nursing note reviewed.  Constitutional:      General: She is not in acute distress.    Appearance: She is well-developed.  HENT:     Head: Normocephalic and atraumatic.  Eyes:     Conjunctiva/sclera: Conjunctivae normal.  Cardiovascular:     Rate and Rhythm: Normal rate and regular rhythm.  Pulmonary:     Effort: Pulmonary effort is normal. No respiratory distress.     Breath sounds: Normal breath sounds. No stridor.  Abdominal:     General: There is no distension.     Tenderness: There is abdominal tenderness in the right lower quadrant, periumbilical area and suprapubic area.  Skin:    General: Skin is warm and dry.  Neurological:     Mental Status: She is alert and oriented to person, place, and time.     Cranial Nerves: No cranial nerve deficit.  Psychiatric:        Mood and Affect: Mood normal.     ED Results / Procedures / Treatments   Labs (all labs ordered are listed, but only abnormal results are displayed) Labs Reviewed  COMPREHENSIVE METABOLIC PANEL - Abnormal; Notable for the following components:      Result Value   Sodium 114 (*)    Potassium 3.4 (*)    Chloride 80 (*)    Glucose, Bld 109 (*)    BUN <5 (*)    Creatinine, Ser 0.42 (*)    Calcium 8.7 (*)    Albumin 3.4 (*)    All other components within normal limits  CBC WITH DIFFERENTIAL/PLATELET - Abnormal; Notable for the following components:   HCT 35.6 (*)    Neutro Abs 7.8 (*)    All other components within normal limits  URINALYSIS, ROUTINE W REFLEX MICROSCOPIC - Abnormal; Notable for the following components:   Color, Urine COLORLESS (*)    Specific Gravity, Urine 1.001 (*)    Hgb urine dipstick MODERATE (*)    Bacteria, UA RARE (*)    All other components within normal limits  LIPASE, BLOOD    EKG None  Radiology DG Abdomen 1  View  Result Date: 07/06/2022 CLINICAL DATA:  Abdominal pain and nausea. EXAM: ABDOMEN - 1 VIEW COMPARISON:  None Available. FINDINGS: No gaseous bowel dilatation to suggest obstruction. Prominent stool volume noted left colon. Surgical clips in the right upper quadrant suggest prior cholecystectomy. Bones are diffusely demineralized. IMPRESSION: Nonspecific bowel gas pattern with somewhat prominent stool volume in the descending colon. Electronically Signed   By: 07/08/2022 M.D.   On: 07/06/2022 05:45    Procedures Procedures  Medications Ordered in ED Medications  sodium chloride 0.9 % bolus 1,000 mL (has no administration in time range)    ED Course/ Medical Decision Making/ A&P This patient with a Hx of multiple medical problems, including heart failure, hyponatremia, encephalopathy, recent hospitalization discharged yesterday presents to the ED for concern of abdominal pain, nausea, vomiting, p.o. intolerance, this involves an extensive number of treatment options, and is a complaint that carries with it a high risk of complications and morbidity.    The differential diagnosis includes dehydration, bowel obstruction, infection, medication effects   Social Determinants of Health:  Tobacco abuse  Additional history obtained:  Additional history and/or information obtained from chart review, notable for hospital course from hospitalization including discharge summary included below: Hospital course 63 year old F with PMH of COPD, anxiety, depression, GERD, HTN, HLD, chronic pain, hyponatremia and tobacco use disorder presented to ED with nausea, vomiting, diarrhea and abdominal pain for about 5 days and admitted with significant electrolyte derangements. Na 104.  K2.2.  Mg 1.4. Cl 66.  WBC 17.6 with left shift.  EKG with QTc of 527.  CT abdomen and pelvis showed distended bladder despite Foley catheter with fullness of the collecting system.  CXR without acute finding.  Patient was  started on IV NS, potassium and magnesium supplementation and admitted for severe hyponatremia and electrolyte derangement.   Patient had acute hypotension and encephalopathy the morning of 07/02/2021.  Encephalopathy workup including CT, ABG, ammonia and infectious workup unrevealing.  Concern about iatrogenic etiology.  She was started on Levophed by PIV.  PCCM consulted as well.   On 1/14, encephalopathy and hypotension resolved.  Hyponatremia improved.  She came off Levophed.  Patient continued to improve.  Remained hemodynamically stable.  Sodium improved to 131.  Other electrolyte issues resolved.  She is discharged on p.o. sodium chloride 1 g twice daily.  ACTH level pending at the time of discharge.   After the initial evaluation, orders, including: Fluid resuscitation, CT, labs were initiated.   Patient placed on Cardiac and Pulse-Oximetry Monitors. The patient was maintained on a cardiac monitor.  The cardiac monitored showed an rhythm of 65 sinus normal The patient was also maintained on pulse oximetry. The readings were typically 100% room air normal   On repeat evaluation of the patient stayed the same  Lab Tests:  I personally interpreted labs.  The pertinent results include: Critical abnormal sodium value, 114.  This is substantial decrease from her most recent charted values on recent hospitalization of 131, fluid resuscitation started  Imaging Studies ordered:  I independently visualized and interpreted imaging which showed CT without alarming findings I agree with the radiologist interpretation  Consultations Obtained:  I requested consultation with the IM,  and discussed lab and imaging findings as well as pertinent plan - they recommend: admission  Dispostion / Final MDM:  After consideration of the diagnostic results and the patient's response to treatment, patient will require admission.  Patient with multiple medical issues including prior episodes of  hyponatremia, and recent hospitalization for this, though with encephalopathy as well now presents with abdominal pain nausea, vomiting, has critically abnormal sodium value here.  Abdominal CT without obstruction, no evidence for concurrent infection, some suspicion for her persistent nausea, vomiting contributing to her electrolyte abnormalities.  Patient required admission for further monitoring, management.  CRITICAL CARE Performed by: Carmin Muskrat Total critical care time: 35 minutes Critical care time was exclusive of separately billable procedures and treating other patients. Critical care was necessary  to treat or prevent imminent or life-threatening deterioration. Critical care was time spent personally by me on the following activities: development of treatment plan with patient and/or surrogate as well as nursing, discussions with consultants, evaluation of patient's response to treatment, examination of patient, obtaining history from patient or surrogate, ordering and performing treatments and interventions, ordering and review of laboratory studies, ordering and review of radiographic studies, pulse oximetry and re-evaluation of patient's condition.   Final Clinical Impression(s) / ED Diagnoses Final diagnoses:  Hyponatremia     Gerhard Munch, MD 07/06/22 1224    Gerhard Munch, MD 07/06/22 1302

## 2022-07-06 NOTE — H&P (Signed)
History and Physical   Katelyn Dallyllen A [place] ZOX:[id number] DOB: 01/23/1961 DOA: 07/06/2022  PCP: Alease MedinaZiglar, Susan K, MD   Patient coming from: Home  Chief Complaint: Abdominal pain, vomiting  HPI: Katelyn [name] is a 62 y.o. female with medical history significant of hyperlipidemia, GERD, hypertension, anxiety, depression, COPD, diastolic CHF, IBS, urinary retention presenting with abdominal pain and emesis.  Patient has had ongoing abdominal pain nausea vomiting and diarrhea since around 1/17.  She presented to [facility] with 5 days of symptoms on 1/12 and was discharged yesterday.  She was found to have significant hyponatremia, hypokalemia, hypomagnesemia with leukocytosis.  She received IV fluids and electrolyte repletion while admitted.  She did have an episode of hypotension and encephalopathy requiring PIV pressure support with critical care consult but this improved by the next day, workup was unrevealing.  She was stable on discharge yesterday and concern was for viral gastroenteritis as etiology.  Of note she was also admitted for hyponatremia and other issues including confusion on January 1.  She presents today as she has continued abdominal pain with emesis.  Abdominal pain improved during previous admission but she stated never resolved.  She reports 2 episodes of vomiting.  She states she has been taking her salt tablets, but has also been drinking a lot of water as she felt thirsty and dehydrated.    She denies fevers, chills, chest pain, shortness of breath, constipation, diarrhea.  ED Course: Vital signs in the ED significant for blood pressure in the 150s to 180s systolic.  Lab workup included CMP with sodium 114, potassium 3.4, chloride 80, glucose 109, calcium 8.7, protein 6.8, albumin 3.4.  CBC within normal limits.  Lipase normal.  Urinalysis with hemoglobin rare bacteria only.  Abdominal x-ray showed nonspecific bowel gas pattern with somewhat prominent stool.  CT of the abdomen pelvis  with contrast showed no acute abnormality.  Impingement at the foramina of L4-L5 was noted as well as small hiatal hernia and residual right-sided hydronephrosis, no renal calculi, somewhat prominent stool also noted on CT.  Emphysema also noted.  Patient received a liter of fluids in the ED.  Review of Systems: As per HPI otherwise all other systems reviewed and are negative.  Past Medical History:  Diagnosis Date   Anxiety    COPD (chronic obstructive pulmonary disease) (HCC)    Depressive disorder    GERD (gastroesophageal reflux disease)    Hiatal hernia    HLD (hyperlipidemia)    Hypertension    IBS (irritable bowel syndrome)    IC (interstitial cystitis)    Migraine    Nicotine dependence    Nontoxic goiter    Overactive bladder    Smoking     Past Surgical History:  Procedure Laterality Date   ABDOMINAL HYSTERECTOMY     ADENOIDECTOMY     CHOLECYSTECTOMY     HERNIA REPAIR     NECK SURGERY     SHOULDER SURGERY     TONSILLECTOMY      Social History  reports that she has been smoking cigarettes. She has been smoking an average of .5 packs per day. She has never used smokeless tobacco. She reports that she does not drink alcohol and does not use drugs.  Allergies  Allergen Reactions   Ciprofloxacin Other (See Comments)    Headache   Macrobid [Nitrofurantoin Monohyd Macro] Itching   Sulfa Antibiotics Itching    Family History  Problem Relation Age of Onset   CAD Father  onset age 46   Heart disease Father    Kidney disease Father    Prostate cancer Father    Colon polyps Maternal Grandmother    Colon cancer Paternal Grandmother    Bladder Cancer Neg Hx    Kidney cancer Neg Hx   Reviewed on admission  Prior to Admission medications   Medication Sig Start Date End Date Taking? Authorizing Provider  albuterol (VENTOLIN HFA) 108 (90 Base) MCG/ACT inhaler Inhale 2 puffs into the lungs every 6 (six) hours as needed for wheezing or shortness of breath.  07/05/22   Almon Hercules, MD  amLODipine (NORVASC) 10 MG tablet Take 1 tablet (10 mg total) by mouth daily. 04/15/21   Debbe Odea, MD  atorvastatin (LIPITOR) 40 MG tablet Take 40 mg by mouth daily. 01/01/15   [provider]  benzonatate (TESSALON) 100 MG capsule Take 1 capsule (100 mg total) by mouth 2 (two) times daily as needed for cough. 06/24/22   Osvaldo Shipper, MD  butalbital-acetaminophen-caffeine (FIORICET) [phone] MG tablet Take 1 tablet by mouth daily as needed for headache or migraine. 04/03/19   [provider]  cyclobenzaprine (FLEXERIL) 10 MG tablet Take 10 mg by mouth 3 (three) times daily as needed for muscle spasms. 03/25/22   [provider]  dicyclomine (BENTYL) 10 MG capsule Take 1 capsule (10 mg total) by mouth 3 (three) times daily as needed for spasms. 03/03/22   Minna Antis, MD  esomeprazole (NEXIUM) 40 MG capsule Take 40 mg by mouth daily at 12 noon.    [provider]  FLOVENT HFA 220 MCG/ACT inhaler Inhale 2 puffs into the lungs 2 (two) times daily. 08/15/20   [provider]  FLUoxetine (PROZAC) 10 MG capsule Take 10 mg by mouth daily.    [provider]  gabapentin (NEURONTIN) 300 MG capsule Take 1 capsule (300 mg total) by mouth 3 (three) times daily as needed (Pain). 06/19/22 06/19/23  Phineas Semen, MD  guaiFENesin (MUCINEX) 600 MG 12 hr tablet Take 1 tablet (600 mg total) by mouth 2 (two) times daily for 5 days. 07/05/22 07/10/22  Almon Hercules, MD  hydrOXYzine (ATARAX) 25 MG tablet Take 25 mg by mouth 3 (three) times daily as needed for anxiety.    [provider]  Meth-Hyo-M Bl-Na Phos-Ph Sal (URIBEL) 118 MG CAPS Take 1 capsule (118 mg total) by mouth every 6 (six) hours as needed. Patient taking differently: Take 1 capsule by mouth every 6 (six) hours as needed (pain). 05/24/22   Michiel Cowboy A, PA-C  metoprolol succinate (TOPROL-XL) 100 MG 24 hr tablet Take 100 mg by mouth daily. 01/28/22    [provider]  ondansetron (ZOFRAN) 4 MG tablet Take 1 tablet (4 mg total) by mouth every 4 (four) hours as needed for nausea or vomiting. 04/20/22   Sloan Leiter, DO  polyethylene glycol (MIRALAX / GLYCOLAX) 17 g packet Take 17 g by mouth daily. 06/24/22   Osvaldo Shipper, MD  QUEtiapine (SEROQUEL) 100 MG tablet Take 100 mg by mouth 2 (two) times daily. 02/18/22   [provider]  senna-docusate (SENOKOT-S) 8.6-50 MG tablet Take 2 tablets by mouth 2 (two) times daily. 06/24/22   Osvaldo Shipper, MD  sodium chloride 1 g tablet Take 1 tablet (1 g total) by mouth 2 (two) times daily with a meal. 06/24/22   Osvaldo Shipper, MD  Vitamin D, Ergocalciferol, (DRISDOL) 1.25 MG (50000 UNIT) CAPS capsule Take 50,000 Units by mouth once a week. 03/13/22  [provider]    Physical Exam: Vitals:   07/06/22 1000 07/06/22 1011 07/06/22 1130 07/06/22 1258  BP: (!) 151/83  (!) 175/95 (!) 163/89  Pulse: 65  72 76  Resp:   16 16  Temp:    97.7 F (36.5 C)  TempSrc:    Oral  SpO2: 100%  100% 98%  Weight:  68 kg    Height:  5\' 4"  (1.626 m)      Physical Exam Constitutional:      General: She is not in acute distress.    Appearance: Normal appearance.  HENT:     Head: Normocephalic and atraumatic.     Mouth/Throat:     Mouth: Mucous membranes are moist.     Pharynx: Oropharynx is clear.  Eyes:     Extraocular Movements: Extraocular movements intact.     Pupils: Pupils are equal, round, and reactive to light.  Cardiovascular:     Rate and Rhythm: Normal rate and regular rhythm.     Pulses: Normal pulses.     Heart sounds: Normal heart sounds.  Pulmonary:     Effort: Pulmonary effort is normal. No respiratory distress.     Breath sounds: Normal breath sounds.  Abdominal:     General: Bowel sounds are normal. There is no distension.     Palpations: Abdomen is soft.     Tenderness: There is no abdominal tenderness.  Musculoskeletal:        General: No swelling or  deformity.  Skin:    General: Skin is warm and dry.  Neurological:     General: No focal deficit present.     Mental Status: Mental status is at baseline.    Labs on Admission: I have personally reviewed following labs and imaging studies  CBC: Recent Labs  Lab 07/01/22 1859 07/02/22 0438 07/05/22 0235 07/06/22 0515  WBC 17.6* 11.1* 5.4 10.0  NEUTROABS 13.8*  --   --  7.8*  HGB 11.9* 11.0* 10.0* 12.7  HCT 32.7* 30.1* 30.3* 35.6*  MCV 82.6 82.9 91.0 85.6  PLT 271 223 262 312    Basic Metabolic Panel: Recent Labs  Lab 07/01/22 1859 07/01/22 2238 07/02/22 0139 07/02/22 0438 07/03/22 0313 07/03/22 0319 07/03/22 0853 07/03/22 1437 07/03/22 1952 07/04/22 0248 07/05/22 0235 07/05/22 0239 07/06/22 0515  NA 104*   < >  --    < >  --    < > 127* 129* 129* 129*  --  131* 114*  K 2.2*   < >  --    < >  --    < > 4.4 4.5 4.2 4.0  --  3.6 3.4*  CL 66*   < >  --    < >  --    < > 98 100 98 97*  --  99 80*  CO2 26   < >  --    < >  --    < > 25 25 26 26   --  28 23  GLUCOSE 136*   < >  --    < >  --    < > 103* 105* 111* 100*  --  92 109*  BUN 8   < >  --    < >  --    < > <5* <5* 5* 6*  --  6* <5*  CREATININE 0.50   < >  --    < >  --    < > 0.55 0.67 0.68  0.73  --  0.61 0.42*  CALCIUM 7.1*   < >  --    < >  --    < > 7.4* 7.9* 7.9* 7.8*  --  7.9* 8.7*  MG 1.4*  --  3.1*  --  2.1  --   --   --   --   --  1.7  --   --   PHOS  --   --   --    < >  --    < > 3.3 3.1 2.9 3.5  --  3.3  --    < > = values in this interval not displayed.    GFR: Estimated Creatinine Clearance: 69.1 mL/min (A) (by C-G formula based on SCr of 0.42 mg/dL (L)).  Liver Function Tests: Recent Labs  Lab 07/01/22 1859 07/02/22 0139 07/02/22 1118 07/02/22 1317 07/03/22 1437 07/03/22 1952 07/04/22 0248 07/05/22 0239 07/06/22 0515  AST 42*  --  25  --   --   --   --   --  29  ALT 53*  --  37  --   --   --   --   --  27  ALKPHOS 54  --  44  --   --   --   --   --  69  BILITOT 0.9  --  0.6  --    --   --   --   --  0.3  PROT 6.0*  --  4.6*  --   --   --   --   --  6.8  ALBUMIN 3.1*   < > 2.3*   < > 2.6* 2.8* 2.5* 2.6* 3.4*   < > = values in this interval not displayed.    Urine analysis:    Component Value Date/Time   COLORURINE COLORLESS (A) 07/06/2022 0548   APPEARANCEUR CLEAR 07/06/2022 0548   APPEARANCEUR Clear 05/24/2022 1536   LABSPEC 1.001 (L) 07/06/2022 0548   PHURINE 8.0 07/06/2022 0548   GLUCOSEU NEGATIVE 07/06/2022 0548   HGBUR MODERATE (A) 07/06/2022 0548   BILIRUBINUR NEGATIVE 07/06/2022 0548   BILIRUBINUR Negative 05/24/2022 1536   KETONESUR NEGATIVE 07/06/2022 0548   PROTEINUR NEGATIVE 07/06/2022 0548   UROBILINOGEN 0.2 11/13/2007 2030   NITRITE NEGATIVE 07/06/2022 0548   LEUKOCYTESUR NEGATIVE 07/06/2022 0548    Radiological Exams on Admission: CT Abdomen Pelvis W Contrast  Result Date: 07/06/2022 CLINICAL DATA:  Acute abdominal pain EXAM: CT ABDOMEN AND PELVIS WITH CONTRAST TECHNIQUE: Multidetector CT imaging of the abdomen and pelvis was performed using the standard protocol following bolus administration of intravenous contrast. RADIATION DOSE REDUCTION: This exam was performed according to the departmental dose-optimization program which includes automated exposure control, adjustment of the mA and/or kV according to patient size and/or use of iterative reconstruction technique. CONTRAST:  58mL OMNIPAQUE IOHEXOL 350 MG/ML SOLN COMPARISON:  07/01/2022 FINDINGS: Lower chest: Small type 1 hiatal hernia. Descending thoracic aortic atherosclerotic vascular calcification. Centrilobular emphysema the lung bases. Hepatobiliary: Cholecystectomy. Steatosis in segment 4 adjacent to the falciform ligament. Pancreas: Unremarkable Spleen: Unremarkable Adrenals/Urinary Tract: Borderline right hydronephrosis terminating at the UPJ. Foley catheter in the urinary bladder. No urinary tract calculi identified. Adrenal glands unremarkable. Stomach/Bowel: No dilated bowel. Borderline  prominence of stool in the proximal colon but not in the distal colon. No substantial degree of bowel wall thickening or specific acute abnormality involving the bowel identified. Vascular/Lymphatic: Atherosclerosis is present, including aortoiliac atherosclerotic disease. Reproductive: Uterus absent.  Adnexa  unremarkable. Other: Small amounts of subcutaneous gas along the anterior abdomen wall are likely injection related. Musculoskeletal: Grade 1 degenerative anterolisthesis at L4-5, with disc uncovering and right foraminal disc protrusion contributing to right foraminal impingement and likely mild central narrowing of the thecal sac at this level. Central disc protrusion extending cephalad at L3-4 with associated vacuum disc phenomenon, image 66 series 7, no overt impingement. IMPRESSION: 1. A specific cause for the patient's acute abdominal pain is not identified. 2. Right foraminal impingement at L4-5 due to a foraminal disc protrusion. 3. Small type 1 hiatal hernia. 4. Borderline right hydronephrosis terminating at the UPJ. No urinary tract calculi are identified. 5. Borderline prominence of stool in the proximal colon but not in the distal colon. 6. Aortic atherosclerosis. 7. Emphysema. Aortic Atherosclerosis (ICD10-I70.0) and Emphysema (ICD10-J43.9). Electronically Signed   By: Van Clines M.D.   On: 07/06/2022 12:04   DG Abdomen 1 View  Result Date: 07/06/2022 CLINICAL DATA:  Abdominal pain and nausea. EXAM: ABDOMEN - 1 VIEW COMPARISON:  None Available. FINDINGS: No gaseous bowel dilatation to suggest obstruction. Prominent stool volume noted left colon. Surgical clips in the right upper quadrant suggest prior cholecystectomy. Bones are diffusely demineralized. IMPRESSION: Nonspecific bowel gas pattern with somewhat prominent stool volume in the descending colon. Electronically Signed   By: Misty Stanley M.D.   On: 07/06/2022 05:45    EKG: Not performed in the emergency  department  Assessment/Plan Principal Problem:   Hyponatremia Active Problems:   Essential hypertension   Hyperlipidemia   COPD (chronic obstructive pulmonary disease) (HCC)   Chronic diastolic CHF (congestive heart failure) (HCC)   Anxiety and depression   GERD without esophagitis   Hypokalemia Hyponatremia > Patient noted to have significant hyponatremia of 114 on presentation.  Mild hypokalemia 3.4. > Seen for this twice recently including discharge from 4-day admission at [facility] yesterday.  Responded to IV fluids there. > Workup there showed hypoosmolar hyponatremia with urine osmole's of around 200, urine sodium of 71, normal cortisol and ACTH, normal TSH.  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  Possibly secondary to medications.  Is on fluoxetine, quetiapine, PPI. > Per chart review workup during admission at the beginning of this month also suspicious for SIADH. > Patient has been taking salt tablets, but overnight she has been drinking a lot of water. > As we know that change in sodium from 132 114 happened in about 26 hours, we can feel comfortable rapidly correcting this hyponatremia.  Received 1 L of IV fluids in the ED. - Monitor on telemetry - Continue IV fluids overnight - N.p.o. except sips with meds for now - Likely resume home salt tabs tomorrow - Trend BMP - Check magnesium - 40 mEq p.o. potassium - Hold off on repeat workup as this was just performed - Will need to make sure she knows not to drink excessive water and may need additional medication (loop, ?vaptan, etc.) in addition to holding possible offending medications.  Abdominal pain > Continues to have abdominal pain that was present during recent admission.  Presumed viral gastroenteritis at that time as symptoms improved without significant intervention.  CT abdomen pelvis here remains largely unrevealing.  She does have some residual right-sided hydronephrosis that could be related given the location of  her pain.  Does have Foley in place for urinary retention.  Previously did have larger degree of hydronephrosis. - Supportive care  Anxiety Depression - Holding home fluoxetine and quetiapine due to possible SIADH as above. -  Continue as needed hydroxyzine  GERD - Hold off on PPI as this has also been associated with hyponatremia  IBS - Continue home as needed Bentyl  Chronic diastolic CHF > Echo performed earlier this month so EF 70-75%, G1 DD, normal RV function. - IV fluids as above - Strict I's and O's, daily weights - Continue with metoprolol  Hypertension - Continue home amlodipine and metoprolol  Hyperlipidemia - Continue home atorvastatin  COPD - Continue home Flovent and as needed albuterol  Urinary retention - Chronic Foley catheter noted  DVT prophylaxis: Lovenox Code Status:   DNR, discussed with patient Family Communication:  None on admission.  Patient discussed possibly contacting her son however his number is not listed. Disposition Plan:   Patient is from:  Home  Anticipated DC to:  Home  Anticipated DC date:  1 to 3 days  Anticipated DC barriers: None  Consults called:  None Admission status:  Observation, telemetry  Severity of Illness: The appropriate patient status for this patient is OBSERVATION. Observation status is judged to be reasonable and necessary in order to provide the required intensity of service to ensure the patient's safety. The patient's presenting symptoms, physical exam findings, and initial radiographic and laboratory data in the context of their medical condition is felt to place them at decreased risk for further clinical deterioration. Furthermore, it is anticipated that the patient will be medically stable for discharge from the hospital within 2 midnights of admission.    Marcelyn Bruins MD [facility]  How to contact the [facility] Attending or Consulting provider [place] or covering provider during after hours [place], for this patient?   Check the care team in [facility] and look for a) attending/consulting [facility] provider listed and b) the [facility] team listed Log into [email or web address] and use [place]'s universal password to access. If you do not have the password, please contact the hospital operator. Locate the [facility] provider you are looking for under [facility] and page to a number that you can be directly reached. If you still have difficulty reaching the provider, please page the [facility] (Director on Call) for the Hospitalists listed on amion for assistance.  07/06/2022, 1:00 PM

## 2022-07-06 NOTE — ED Provider Triage Note (Signed)
Emergency Medicine Provider Triage Evaluation Note  Katelyn [name] , a 62 y.o. female  was evaluated in triage.  Pt complains of abdominal pain nausea vomiting started on Monday, pain is remained constant, lower abdomen does not radiate, associated nausea vomiting denies melena, bloody stools, hematemesis coffee-ground emesis, she states that she also has a headache, without paresthesia or weakness of the lower extremities, states the symptoms have remained unchanged since being discharged from the hospital.  Reviewed her chart was admitted for significant electrolyte derailment hyponatremic, she had CT imaging of the head and abdomen both which negative acute findings.  Review of Systems  Positive: Headaches, abdominal pain Negative: Chest pain, shortness of breath  Physical Exam  BP (!) 183/105 (BP Location: Left Arm)   Pulse 83   Temp (!) 97.5 F (36.4 C) (Oral)   Resp 20   SpO2 98%  Gen:   Awake, no distress   Resp:  Normal effort  MSK:   Moves extremities without difficulty  Other:  Cranial nerves II through XII grossly intact no difficulty with word finding following two-step commands there is no unilateral weakness present.  Medical Decision Making  Medically screening exam initiated at 5:03 AM.  Appropriate orders placed.  Katelyn [name] was informed that the remainder of the evaluation will be completed by another provider, this initial triage assessment does not replace that evaluation, and the importance of remaining in the ED until their evaluation is complete.  Lab work imaging ordered will need further workup.   Marcello Fennel, PA-C 07/06/22 0505

## 2022-07-06 NOTE — ED Triage Notes (Addendum)
Per EMS, Pt from home, pt c/o abdominal pain, n/V and a headache.  She was seen two days ago at [facility], did not take prescribed medication.    182/90 HR70 100% RA 56 CBG   Pt has a foley and there was 2200 was drained from bag.

## 2022-07-06 NOTE — ED Notes (Signed)
ED TO INPATIENT HANDOFF REPORT  ED Nurse Name and Phone #: Richardson Landry [phone]  S Name/Age/Gender Katelyn [name] 62 y.o. female Room/Bed: 043C/043C  Code Status   Code Status: DNR  Home/SNF/Other Home Patient oriented to: self, place, and situation Is this baseline? No   Triage Complete: Triage complete  Chief Complaint Hyponatremia [E87.1]  Triage Note Per EMS, Pt from home, pt c/o abdominal pain, n/V and a headache.  She was seen two days ago at [facility], did not take prescribed medication.    182/90 HR70 100% RA 56 CBG   Pt has a foley and there was 2200 was drained from bag.     Allergies Allergies  Allergen Reactions   Ciprofloxacin Other (See Comments)    Headache   Macrobid [Nitrofurantoin Monohyd Macro] Itching   Sulfa Antibiotics Itching    Level of Care/Admitting Diagnosis ED Disposition     ED Disposition  Admit   Condition  --   Comment  Hospital Area: [place] [[id number]]  Level of Care: Telemetry Medical [104]  May place patient in observation at [facility] or [place] if equivalent level of care is available:: No  Covid Evaluation: Asymptomatic - no recent exposure (last 10 days) testing not required  Diagnosis: Hyponatremia [198519]  Admitting Physician: Marcelyn Bruins [[id number]]  Attending Physician: Marcelyn Bruins [[id number]]          B Medical/Surgery History Past Medical History:  Diagnosis Date   Anxiety    COPD (chronic obstructive pulmonary disease) ([place])    Depressive disorder    Elevated troponin 07/04/2022   GERD (gastroesophageal reflux disease)    Hiatal hernia    HLD (hyperlipidemia)    Hypertension    Hypomagnesemia 06/21/2022   Hypotension 07/02/2022   IBS (irritable bowel syndrome)    IC (interstitial cystitis)    Migraine    Nicotine dependence    Nontoxic goiter    Overactive bladder    Smoking    Past Surgical History:  Procedure Laterality Date   ABDOMINAL HYSTERECTOMY      ADENOIDECTOMY     CHOLECYSTECTOMY     HERNIA REPAIR     NECK SURGERY     SHOULDER SURGERY     TONSILLECTOMY       A IV Location/Drains/Wounds Patient Lines/Drains/Airways Status     Active Line/Drains/Airways     Name Placement date Placement time Site Days   Peripheral IV 07/06/22 20 G Left Antecubital 07/06/22  1129  Antecubital  less than 1   Urethral Catheter Newman Pies RN Double-lumen 16 Fr. 07/01/22  2352  Double-lumen  5            Intake/Output Last 24 hours  Intake/Output Summary (Last 24 hours) at 07/06/2022 1748 Last data filed at 07/06/2022 1507 Gross per 24 hour  Intake --  Output 5500 ml  Net -5500 ml    Labs/Imaging Results for orders placed or performed during the hospital encounter of 07/06/22 (from the past 48 hour(s))  Comprehensive metabolic panel     Status: Abnormal   Collection Time: 07/06/22  5:15 AM  Result Value Ref Range   Sodium 114 (LL) 135 - 145 mmol/L    Comment: CRITICAL RESULT CALLED TO, READ BACK BY AND VERIFIED WITH J.FERRAINOLO,RN. [id number] 07/06/22. LPAIT   Potassium 3.4 (L) 3.5 - 5.1 mmol/L   Chloride 80 (L) 98 - 111 mmol/L   CO2 23 22 - 32 mmol/L   Glucose, Bld 109 (H) 70 -  99 mg/dL    Comment: Glucose reference range applies only to samples taken after fasting for at least 8 hours.   BUN <5 (L) 8 - 23 mg/dL   Creatinine, Ser 0.42 (L) 0.44 - 1.00 mg/dL   Calcium 8.7 (L) 8.9 - 10.3 mg/dL   Total Protein 6.8 6.5 - 8.1 g/dL   Albumin 3.4 (L) 3.5 - 5.0 g/dL   AST 29 15 - 41 U/L   ALT 27 0 - 44 U/L   Alkaline Phosphatase 69 38 - 126 U/L   Total Bilirubin 0.3 0.3 - 1.2 mg/dL   GFR, Estimated >60 >60 mL/min    Comment: (NOTE) Calculated using the CKD-EPI Creatinine Equation (2021)    Anion gap 11 5 - 15    Comment: Performed at [facility], [place] [address] [place], [place] [address]  CBC with Differential     Status: Abnormal   Collection Time: 07/06/22  5:15 AM  Result Value Ref Range   WBC 10.0 4.0 - 10.5 K/uL    RBC 4.16 3.87 - 5.11 MIL/uL   Hemoglobin 12.7 12.0 - 15.0 g/dL   HCT 35.6 (L) 36.0 - 46.0 %   MCV 85.6 80.0 - 100.0 fL   MCH 30.5 26.0 - 34.0 pg   MCHC 35.7 30.0 - 36.0 g/dL   RDW 12.3 11.5 - 15.5 %   Platelets 312 150 - 400 K/uL   nRBC 0.0 0.0 - 0.2 %   Neutrophils Relative % 78 %   Neutro Abs 7.8 (H) 1.7 - 7.7 K/uL   Lymphocytes Relative 14 %   Lymphs Abs 1.4 0.7 - 4.0 K/uL   Monocytes Relative 7 %   Monocytes Absolute 0.7 0.1 - 1.0 K/uL   Eosinophils Relative 0 %   Eosinophils Absolute 0.0 0.0 - 0.5 K/uL   Basophils Relative 0 %   Basophils Absolute 0.0 0.0 - 0.1 K/uL   Immature Granulocytes 1 %   Abs Immature Granulocytes 0.05 0.00 - 0.07 K/uL    Comment: Performed at [facility] [address], [place], [place] [address]  Lipase, blood     Status: None   Collection Time: 07/06/22  5:15 AM  Result Value Ref Range   Lipase 26 11 - 51 U/L    Comment: Performed at [facility] [address] [place], [place] [address]  Urinalysis, Routine w reflex microscopic Urine, Catheterized     Status: Abnormal   Collection Time: 07/06/22  5:48 AM  Result Value Ref Range   Color, Urine COLORLESS (A) YELLOW   APPearance CLEAR CLEAR   Specific Gravity, Urine 1.001 (L) 1.005 - 1.030   pH 8.0 5.0 - 8.0   Glucose, UA NEGATIVE NEGATIVE mg/dL   Hgb urine dipstick MODERATE (A) NEGATIVE   Bilirubin Urine NEGATIVE NEGATIVE   Ketones, ur NEGATIVE NEGATIVE mg/dL   Protein, ur NEGATIVE NEGATIVE mg/dL   Nitrite NEGATIVE NEGATIVE   Leukocytes,Ua NEGATIVE NEGATIVE   RBC / HPF 0-5 0 - 5 RBC/hpf   WBC, UA 0-5 0 - 5 WBC/hpf   Bacteria, UA RARE (A) NONE SEEN   Squamous Epithelial / HPF 0-5 0 - 5 /HPF    Comment: Performed at [facility] [address] [place], [place] [address]   CT Abdomen Pelvis W Contrast  Result Date: 07/06/2022 CLINICAL DATA:  Acute abdominal pain EXAM: CT ABDOMEN AND PELVIS WITH CONTRAST TECHNIQUE: Multidetector CT imaging of the abdomen and pelvis  was performed  using the standard protocol following bolus administration of intravenous contrast. RADIATION DOSE REDUCTION: This exam was performed according to the departmental dose-optimization program which includes automated exposure control, adjustment of the mA and/or kV according to patient size and/or use of iterative reconstruction technique. CONTRAST:  7mL OMNIPAQUE IOHEXOL 350 MG/ML SOLN COMPARISON:  07/01/2022 FINDINGS: Lower chest: Small type 1 hiatal hernia. Descending thoracic aortic atherosclerotic vascular calcification. Centrilobular emphysema the lung bases. Hepatobiliary: Cholecystectomy. Steatosis in segment 4 adjacent to the falciform ligament. Pancreas: Unremarkable Spleen: Unremarkable Adrenals/Urinary Tract: Borderline right hydronephrosis terminating at the UPJ. Foley catheter in the urinary bladder. No urinary tract calculi identified. Adrenal glands unremarkable. Stomach/Bowel: No dilated bowel. Borderline prominence of stool in the proximal colon but not in the distal colon. No substantial degree of bowel wall thickening or specific acute abnormality involving the bowel identified. Vascular/Lymphatic: Atherosclerosis is present, including aortoiliac atherosclerotic disease. Reproductive: Uterus absent.  Adnexa unremarkable. Other: Small amounts of subcutaneous gas along the anterior abdomen wall are likely injection related. Musculoskeletal: Grade 1 degenerative anterolisthesis at L4-5, with disc uncovering and right foraminal disc protrusion contributing to right foraminal impingement and likely mild central narrowing of the thecal sac at this level. Central disc protrusion extending cephalad at L3-4 with associated vacuum disc phenomenon, image 66 series 7, no overt impingement. IMPRESSION: 1. A specific cause for the patient's acute abdominal pain is not identified. 2. Right foraminal impingement at L4-5 due to a foraminal disc protrusion. 3. Small type 1 hiatal hernia. 4. Borderline  right hydronephrosis terminating at the UPJ. No urinary tract calculi are identified. 5. Borderline prominence of stool in the proximal colon but not in the distal colon. 6. Aortic atherosclerosis. 7. Emphysema. Aortic Atherosclerosis (ICD10-I70.0) and Emphysema (ICD10-J43.9). Electronically Signed   By: Gaylyn Rong M.D.   On: 07/06/2022 12:04   DG Abdomen 1 View  Result Date: 07/06/2022 CLINICAL DATA:  Abdominal pain and nausea. EXAM: ABDOMEN - 1 VIEW COMPARISON:  None Available. FINDINGS: No gaseous bowel dilatation to suggest obstruction. Prominent stool volume noted left colon. Surgical clips in the right upper quadrant suggest prior cholecystectomy. Bones are diffusely demineralized. IMPRESSION: Nonspecific bowel gas pattern with somewhat prominent stool volume in the descending colon. Electronically Signed   By: Kennith Center M.D.   On: 07/06/2022 05:45    Pending Labs Unresulted Labs (From admission, onward)     Start     Ordered   07/13/22 0500  Creatinine, serum  (enoxaparin (LOVENOX)    CrCl >/= 30 ml/min)  Weekly,   R     Comments: while on enoxaparin therapy    07/06/22 1300   07/07/22 0500  CBC  Tomorrow morning,   R        07/06/22 1300   07/06/22 1255  Basic metabolic panel  Now then every 8 hours,   R (with TIMED occurrences)      07/06/22 1300   07/06/22 1255  Magnesium  Once,   R        07/06/22 1300            Vitals/Pain Today's Vitals   07/06/22 1130 07/06/22 1258 07/06/22 1715 07/06/22 1727  BP: (!) 175/95 (!) 163/89 (!) 160/87   Pulse: 72 76 76   Resp: 16 16 (!) 21   Temp:  97.7 F (36.5 C)  99.3 F (37.4 C)  TempSrc:  Oral  Oral  SpO2: 100% 98% 98%   Weight:      Height:  PainSc: 10-Worst pain ever       Isolation Precautions No active isolations  Medications Medications  amLODipine (NORVASC) tablet 10 mg (has no administration in time range)  atorvastatin (LIPITOR) tablet 40 mg (has no administration in time range)  metoprolol  succinate (TOPROL-XL) 24 hr tablet 100 mg (has no administration in time range)  dicyclomine (BENTYL) capsule 10 mg (has no administration in time range)  enoxaparin (LOVENOX) injection 40 mg (40 mg Subcutaneous Given 07/06/22 1437)  sodium chloride flush (NS) 0.9 % injection 3 mL (3 mLs Intravenous Not Given 07/06/22 1301)  0.9 %  sodium chloride infusion ( Intravenous New Bag/Given 07/06/22 1440)  acetaminophen (TYLENOL) tablet 650 mg (has no administration in time range)    Or  acetaminophen (TYLENOL) suppository 650 mg (has no administration in time range)  polyethylene glycol (MIRALAX / GLYCOLAX) packet 17 g (has no administration in time range)  albuterol (PROVENTIL) (2.5 MG/3ML) 0.083% nebulizer solution 2.5 mg (has no administration in time range)  budesonide (PULMICORT) nebulizer solution 0.25 mg (0.25 mg Nebulization Given 07/06/22 1438)  hydrOXYzine (ATARAX) tablet 25 mg (has no administration in time range)  sodium chloride 0.9 % bolus 1,000 mL (1,000 mLs Intravenous New Bag/Given 07/06/22 1132)  iohexol (OMNIPAQUE) 350 MG/ML injection 75 mL (75 mLs Intravenous Contrast Given 07/06/22 1149)  potassium chloride (KLOR-CON) packet 40 mEq (40 mEq Oral Given 07/06/22 1437)    Mobility walks     Focused Assessments Neuro Assessment Handoff:  Swallow screen pass? Yes          Neuro Assessment: Exceptions to [facility] Neuro Checks:      Has TPA been given? 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

## 2022-07-06 NOTE — Plan of Care (Signed)
Pt to room 19 via stretcher and transporter.  Pt ambulates to bed with standby assist.  Foley cath noted patent with cloudy urine noted.  Pt BP elevated, 201/103, pulse 80.  Pt is figiditing with phone, moving around.  NADN.  Will notify MD. Edythe Clarity, RN

## 2022-07-06 NOTE — Plan of Care (Signed)
Report from Richardson Landry, ED.  Pt to be brought up soon. Edythe Clarity, RN

## 2022-07-07 ENCOUNTER — Observation Stay (HOSPITAL_COMMUNITY): Payer: Medicaid Other

## 2022-07-07 DIAGNOSIS — E871 Hypo-osmolality and hyponatremia: Secondary | ICD-10-CM | POA: Diagnosis not present

## 2022-07-07 LAB — BASIC METABOLIC PANEL
Anion gap: 8 (ref 5–15)
BUN: <5 mg/dL — ABNORMAL LOW (ref 8–23)
CO2: 23 mmol/L (ref 22–32)
Calcium: 8 mg/dL — ABNORMAL LOW (ref 8.9–10.3)
Chloride: 96 mmol/L — ABNORMAL LOW (ref 98–111)
Creatinine, Ser: 0.52 mg/dL (ref 0.44–1.00)
GFR, Estimated: >60 mL/min (ref >60)
Glucose, Bld: 97 mg/dL (ref 70–99)
Potassium: 3.7 mmol/L (ref 3.5–5.1)
Sodium: 127 mmol/L — ABNORMAL LOW (ref 135–145)

## 2022-07-07 LAB — CBC
HCT: 33.4 % — ABNORMAL LOW (ref 36.0–46.0)
Hemoglobin: 11.5 g/dL — ABNORMAL LOW (ref 12.0–15.0)
MCH: 30 pg (ref 26.0–34.0)
MCHC: 34.4 g/dL (ref 30.0–36.0)
MCV: 87.2 fL (ref 80.0–100.0)
Platelets: 300 K/uL (ref 150–400)
RBC: 3.83 MIL/uL — ABNORMAL LOW (ref 3.87–5.11)
RDW: 12.7 % (ref 11.5–15.5)
WBC: 5.4 K/uL (ref 4.0–10.5)
nRBC: 0 % (ref 0.0–0.2)

## 2022-07-07 LAB — BASIC METABOLIC PANEL WITH GFR
Anion gap: 9 (ref 5–15)
BUN: 5 mg/dL — ABNORMAL LOW (ref 8–23)
CO2: 23 mmol/L (ref 22–32)
Calcium: 8.2 mg/dL — ABNORMAL LOW (ref 8.9–10.3)
Chloride: 95 mmol/L — ABNORMAL LOW (ref 98–111)
Creatinine, Ser: 0.65 mg/dL (ref 0.44–1.00)
GFR, Estimated: >60 mL/min (ref >60)
Glucose, Bld: 92 mg/dL (ref 70–99)
Potassium: 3.5 mmol/L (ref 3.5–5.1)
Sodium: 127 mmol/L — ABNORMAL LOW (ref 135–145)

## 2022-07-07 LAB — SODIUM, URINE, RANDOM: Sodium, Ur: 91 mmol/L

## 2022-07-07 LAB — OSMOLALITY: Osmolality: 265 mosm/kg — ABNORMAL LOW (ref 275–295)

## 2022-07-07 MED ORDER — SODIUM CHLORIDE 0.9 % IV SOLN: INTRAVENOUS | Status: DC

## 2022-07-07 NOTE — Plan of Care (Signed)

## 2022-07-07 NOTE — Consult Note (Signed)
Nephrology Consult   Requesting provider: Terrilee Croak Service requesting consult: Hospitalist Reason for consult: Hyponatremia   Assessment/Recommendations: Katelyn [name] is a/an 62 y.o. female with a past medical history COPD, anxiety, depression, GERD, HTN, chronic pain who presents with hyponatremia. who present w/ hyponatremia   Severe Hyponatremia: strange story with multiple factors. Initially the HCTZ was a big driver of the issue. This is why the potassium was also low. Alcohol use would fit with this but the patient denies that.  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 Possibly 2/2 SSRI but an underlying pulmonary process could be contributing.  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  This could be pulmonary edema and I will obtain chest x-ray to evaluate.  If her chest x-ray is normal again a CT scan should be ordered to better evaluate for possible pneumonia.  Empiric treatment for pneumonia could be considered as well  Abdominal Pain/N/V: CT scan without major concern.  Unclear cause.  Has IBS at baseline.  Management per primary team  Urinary Retention: Persistent issue with some hydronephrosis during initial hospitalization.  Now with Foley in  place  HTN: Holding hydrochlorothiazide.  Continue home blood pressure medications for now  Anemia: Mild decrease in hemoglobin.  Continue to monitor    Recommendations conveyed to primary service.    [facility] 07/07/2022 2:22 PM   _____________________________________________________________________________________ CC: Hyponatremia  History of Present Illness: Katelyn [name] is a/an 62 y.o. female with a past medical history of COPD, anxiety, depression, GERD, HTN, chronic pain who presents with hyponatremia.  Patient is a very poor historian.  I asked her what medical problems she had and she said I guess I have some hypertension but was unable to tell me other medical problems she might have.  When I asked her about the medication she takes and what they were for she mostly said she does not remember.  A lot of the history regarding recent events was obtained per chart review as the patient was unable to tell me details.  The patient has had mild and intermittent hyponatremia at least since 2014.  However, this became much more severe around 06/21/2022.  This is when she was presented to the hospital was found to have a sodium of 112.  She also had fairly low potassium and magnesium at that time.  CT abdomen pelvis also demonstrated some renal fullness and likely inadequate emptying of the bladder.  She was given electrolyte repletion as well as IV fluids and ultimately salt tabs and sodium improved with time.  She also had a fever and cough during this initial hospitalization and it was felt this was most likely COPD exacerbation.  She was initially placed on broad-spectrum antibiotics and ultimately this was changed to steroids and doxycycline.  Moved to 131 and she was  discharged.  Before this hospitalization she was on hydrochlorothiazide and this medication was stopped at the time of discharge.  She returned on 07/01/2022 and sodium was 104 at that  time.  She was also experiencing vomiting, diarrhea, abdominal pain for several days.  She also experienced encephalopathy.  Sodium initially improved to 115 then decreased to 107 and then ultimately sodium improved with IV fluids up to around 131.  She was also continued on sodium tablets.  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  She was discharged on 07/05/2022 with a sodium of 131.  She returned the next day on 07/06/2022.  She states that she was feeling weird.  Her sodium was 114 at that time.  Notably her urinalysis demonstrated a very low urine osmolality.  When I asked her about drinking a lot of water at home she says "maybe I did."  She continues to endorse symptoms such as nausea and vomiting.  She states that she is continued to have a wet cough that is minimally productive of sputum for 2 weeks.  No obvious fevers that she is aware of.  No longer taking hydrochlorothiazide that she knows of.   Medications:  Current Facility-Administered Medications  Medication Dose Route Frequency Provider Last Rate Last Admin   0.9 %  sodium chloride infusion   Intravenous Continuous Dahal, Binaya, MD 50 mL/hr at 07/07/22 1156 New Bag at 07/07/22 1156   acetaminophen (TYLENOL) tablet 650 mg  650 mg Oral Q6H PRN Synetta Fail, MD   650 mg at 07/06/22 2132   Or   acetaminophen (TYLENOL) suppository 650 mg  650 mg Rectal Q6H PRN Synetta Fail, MD       albuterol (PROVENTIL) (2.5 MG/3ML) 0.083% nebulizer solution 2.5 mg  2.5 mg Nebulization Q6H PRN Synetta Fail, MD       amLODipine (NORVASC) tablet 10 mg  10 mg Oral Daily Synetta Fail, MD   10 mg at 07/07/22 0840   atorvastatin (LIPITOR) tablet 40 mg  40 mg Oral Daily Synetta Fail, MD   40 mg at 07/07/22 0839   budesonide (PULMICORT) nebulizer solution 0.25 mg  0.25 mg Nebulization BID Synetta Fail, MD   0.25 mg at 07/07/22 1238   Chlorhexidine Gluconate Cloth 2 % PADS 6 each  6 each Topical Daily Synetta Fail, MD   6 each at 07/07/22 1022   dicyclomine (BENTYL) capsule 10 mg  10 mg Oral TID PRN Synetta Fail, MD   10 mg at 07/06/22 2132   enoxaparin (LOVENOX) injection 40 mg  40 mg Subcutaneous Q24H Synetta Fail, MD   40 mg at 07/06/22 1437   hydrOXYzine (ATARAX) tablet 25 mg  25 mg Oral TID PRN Synetta Fail, MD   25 mg at 07/07/22 1225   labetalol (NORMODYNE) injection 5 mg  5 mg Intravenous Q2H PRN Synetta Fail, MD   5 mg at 07/06/22 1942   metoprolol succinate (TOPROL-XL) 24 hr tablet 100 mg  100 mg Oral Daily Synetta Fail, MD   100 mg at 07/07/22 7591   polyethylene glycol (MIRALAX / GLYCOLAX) packet 17 g  17 g Oral Daily PRN Synetta Fail, MD       sodium chloride flush (NS) 0.9 % injection 3 mL  3 mL Intravenous Q12H Synetta Fail, MD         ALLERGIES Ciprofloxacin, Macrobid [nitrofurantoin monohyd macro], and Sulfa antibiotics  MEDICAL HISTORY Past  Medical History:  Diagnosis Date   Anxiety    COPD (chronic obstructive pulmonary disease) ([facility])    Depressive disorder    Elevated troponin 07/04/2022   GERD (gastroesophageal reflux disease)    Hiatal hernia    HLD (hyperlipidemia)    Hypertension    Hypomagnesemia 06/21/2022   Hypotension 07/02/2022   IBS (irritable bowel syndrome)    IC (interstitial cystitis)    Migraine    Nicotine dependence    Nontoxic goiter    Overactive bladder    Smoking      SOCIAL HISTORY Social History   Socioeconomic History   Marital status: Divorced    Spouse name: Not on file   Number of children: 3   Years of education: Not on file   Highest education level: Not on file  Occupational History    Comment: Disability  Tobacco Use   Smoking status: Every Day    Packs/day: 0.50    Types: Cigarettes   Smokeless tobacco: Never  Substance and Sexual Activity   Alcohol use: No   Drug use: No   Sexual activity: Yes    Birth control/protection: Surgical  Other Topics Concern   Not  on file  Social History Narrative   Not on file   Social Determinants of Health   Financial Resource Strain: Not on file  Food Insecurity: No Food Insecurity (06/22/2022)   Hunger Vital Sign    Worried About Running Out of Food in the Last Year: Never true    Ran Out of Food in the Last Year: Never true  Transportation Needs: No Transportation Needs (06/22/2022)   PRAPARE - Administrator, Civil Service (Medical): No    Lack of Transportation (Non-Medical): No  Physical Activity: Not on file  Stress: Not on file  Social Connections: Not on file  Intimate Partner Violence: Not At Risk (06/22/2022)   Humiliation, Afraid, Rape, and Kick questionnaire    Fear of Current or Ex-Partner: No    Emotionally Abused: No    Physically Abused: No    Sexually Abused: No     FAMILY HISTORY Family History  Problem Relation Age of Onset   CAD Father        onset age 74   Heart disease Father    Kidney disease Father    Prostate cancer Father    Colon polyps Maternal Grandmother    Colon cancer Paternal Grandmother    Bladder Cancer Neg Hx    Kidney cancer Neg Hx       Review of Systems: 12 systems reviewed Otherwise as per HPI, all other systems reviewed and negative  Physical Exam: Vitals:   07/07/22 0505 07/07/22 0755  BP: (!) 154/76 (!) 171/87  Pulse: 75 77  Resp: 17   Temp: 98.3 F (36.8 C) 97.6 F (36.4 C)  SpO2: 98% 98%   No intake/output data recorded.  Intake/Output Summary (Last 24 hours) at 07/07/2022 1422 Last data filed at 07/07/2022 0507 Gross per 24 hour  Intake 612.56 ml  Output 3100 ml  Net -2487.44 ml   General: Chronically ill-appearing, lying in bed, no distress HEENT: anicteric sclera, oropharynx clear without lesions CV: Normal rate, no murmurs, no peripheral edema Lungs: Mild expiratory wheezing heard throughout, crackles at the right lower lung base, normal work of breathing, intermittent coughing Abd: soft, non-tender,  non-distended Skin: no visible lesions or rashes Psych: alert, engaged, appropriate mood and affect Musculoskeletal: no obvious deformities Neuro: normal speech, no gross  focal deficits   Test Results Reviewed Lab Results  Component Value Date   NA 127 (L) 07/07/2022   K 3.5 07/07/2022   CL 95 (L) 07/07/2022   CO2 23 07/07/2022   BUN 5 (L) 07/07/2022   CREATININE 0.65 07/07/2022   GFR 95.59 05/25/2020   CALCIUM 8.2 (L) 07/07/2022   ALBUMIN 3.4 (L) 07/06/2022   PHOS 3.3 07/05/2022    CBC Recent Labs  Lab 07/01/22 1859 07/02/22 0438 07/05/22 0235 07/06/22 0515 07/07/22 0509  WBC 17.6*   < > 5.4 10.0 5.4  NEUTROABS 13.8*  --   --  7.8*  --   HGB 11.9*   < > 10.0* 12.7 11.5*  HCT 32.7*   < > 30.3* 35.6* 33.4*  MCV 82.6   < > 91.0 85.6 87.2  PLT 271   < > 262 312 300   < > = values in this interval not displayed.    I have reviewed all relevant outside healthcare records related to the patient's current hospitalization

## 2022-07-07 NOTE — Progress Notes (Signed)
Currently pulmicort meds not available in either pyxis.  Pharmacy notified to send meds, currently meds not avail.  RT to check back later.  RN notified.

## 2022-07-07 NOTE — Progress Notes (Signed)
PROGRESS NOTE  Katelyn [name]  DOB: 02-16-1961  PCP: Alease Medina, MD ZDG:[id number]  DOA: 07/06/2022  LOS: 0 days  Hospital Day: 2  Brief narrative: Katelyn [name] is a 62 y.o. female with PMH significant for HTN, HLD, diastolic CHF, chronic hyponatremia COPD, GERD, anxiety/depression, IBS, interstitial cystitis, migraine, chronic smoking who was recently hospitalized twice. 1/1-1/5 for acute encephalopathy, low sodium level at 112, urinary retention requiring Foley catheter and discharged home. 1/12 to 1/16 for nausea, vomiting or diarrhea leading to hypotension and severe electrolyte derangements.  Her sodium level was low at 104, potassium of 2.2.  He also had significant urinary retention due to Foley catheter malfunction.  She was in circulatory shock and required Levophed briefly.  After clinical and labs improvement, ultimately discharged home on 1/16 on salt tablets However within 24 hours of discharge, patient started complaining of abdominal pain, nausea vomiting again and was brought to the ED by EMS on 1/17  In the ED, patient was afebrile, heart rate in 80s, blood pressure 183/105, breathing on room air  Labs showed sodium level significantly low at 114 which is an acute drop from 131 from 24 hrs ago.  Potassium low at 3.4.  CBC unremarkable CT of the abdomen pelvis with contrast showed no acute abnormality.  Admitted to [facility]  Subjective: Patient was seen and examined this morning.  Pleasant middle-aged Caucasian female.  Looks older for her age.  Not in distress.  Nauseated.  No vomiting or diarrhea. Chart reviewed  Assessment and plan: Recurrent severe hyponatremia Patient seems to have chronic hyponatremia at least since 2014. In the last 3 weeks, patient has been hospitalized twice with severe hyponatremia.  In the most recent hospitalization, sodium level was significantly at 104.  Workup there showed hypoosmolar hyponatremia with urine osmolality around 200, urine  sodium level of 71, normal cortisol level, ACTH and TSH.  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 HCTZ was stopped. Patient was discharged on salt tablets and fluid restriction. Presented this time with significantly low sodium level at 114 which is an acute drop from 131 from 24 hours prior.  Sodium level improving already has trended below. Nephrology consult appreciated. Recommended fluid restriction.  IV fluid has been stopped will repeat BMP twice daily Offending meds on hold Recent Labs  Lab 07/03/22 0319 07/03/22 0853 07/03/22 1437 07/03/22 1952 07/04/22 0248 07/05/22 0239 07/06/22 0515 07/06/22 1718 07/06/22 2039 07/07/22 0509  NA 129* 127* 129* 129* 129* 131* 114* 124* 125* 127*   Mild crackles on the right base Obtain chest x-ray.  IV fluid has been stopped   Hypokalemia Potassium level was low at 3.4 on presentation.  Replacement given.  Improved Continue to monitor Recent Labs  Lab 07/01/22 1859 07/01/22 2238 07/02/22 0139 07/02/22 0438 07/03/22 0313 07/03/22 0319 07/03/22 0853 07/03/22 1437 07/03/22 1952 07/04/22 0248 07/05/22 0235 07/05/22 0239 07/06/22 0515 07/06/22 1718 07/06/22 2039 07/07/22 0509  K 2.2*   < >  --    < >  --    < > 4.4 4.5 4.2 4.0  --  3.6 3.4* 4.0 3.6 3.5  MG 1.4*  --  3.1*  --  2.1  --   --   --   --   --  1.7  --   --  1.8  --   --   PHOS  --   --   --    < >  --    < >  3.3 3.1 2.9 3.5  --  3.3  --   --   --   --    < > = values in this interval not displayed.   Abdominal pain Patient continues to have abdominal pain that was present during recent admission.  Presumed viral gastroenteritis at that time as symptoms improved without significant intervention.   CT abdomen pelvis remains largely unrevealing.   Continue as needed IV antiemetics.  Start on clear liquid diet.  Urinary retention During her hospitalization on 1/1, she was noted to have urinary retention and Foley catheter was inserted.  In  subsequent admission, she was noted to have significant urine retention and hydronephrosis due to catheter malfunction.  Catheter was changed.   Repeat CT scan this admission showed improvement in retention with some residual right-sided hydronephrosis. Continue Foley catheter   Chronic diastolic CHF Essential hypertension PTA on metoprolol succinate 100 mg daily, amlodipine 10 mg daily echo performed earlier this month so EF 70-75%, G1 DD, normal RV function. Continue metoprolol and amlodipine.   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   Bowel regimen ordered.  Migraine Fioricet as needed   Mobility: Encourage ambulation  Goals of care   Code Status: DNR    DVT prophylaxis:  enoxaparin (LOVENOX) injection 40 mg Start: 07/06/22 1400   Antimicrobials: None Fluid: Fluid restriction Consultants: Nephrology Family Communication: None at bedside  Scheduled Meds:  amLODipine  10 mg Oral Daily   atorvastatin  40 mg Oral Daily   budesonide (PULMICORT) nebulizer solution  0.25 mg Nebulization BID   Chlorhexidine Gluconate Cloth  6 each Topical Daily   enoxaparin (LOVENOX) injection  40 mg Subcutaneous Q24H   metoprolol succinate  100 mg Oral Daily   sodium chloride flush  3 mL Intravenous Q12H    PRN meds: acetaminophen **OR** acetaminophen, albuterol, dicyclomine, hydrOXYzine, labetalol, polyethylene glycol   Infusions:    Antimicrobials: Anti-infectives (From admission, onward)    None       Skin assessment:      Diet:  Diet Order             Diet clear liquid Room service appropriate? Yes; Fluid  consistency: Thin  Diet effective now                  Nutritional status:  Body mass index is 25.75 kg/m.           Status is: Observation Level of care: Telemetry Medical  Continue in-hospital care because: Needs sodium management  Dispo: The patient is from: Home              Anticipated d/c is to: Pending clinical              Patient currently is not medically stable to d/c.   Difficult to place patient No   Objective: Vitals:   07/07/22 0505 07/07/22 0755  BP: (!) 154/76 (!) 171/87  Pulse: 75 77  Resp: 17   Temp: 98.3 F (36.8 C) 97.6 F (36.4 C)  SpO2: 98% 98%    Intake/Output Summary (Last 24 hours) at 07/07/2022 1516 Last data filed at 07/07/2022 1512  Gross per 24 hour  Intake 612.56 ml  Output 2700 ml  Net -2087.44 ml   Filed Weights   07/06/22 1011  Weight: 68 kg   Weight change:  Body mass index is 25.75 kg/m.   Physical Exam: General exam: Pleasant, middle-aged Caucasian female. Skin: No rashes, lesions or ulcers. HEENT: Atraumatic, normocephalic, no obvious bleeding Lungs: Mild crackles in the right base CVS: Regular rate and rhythm, normal GI/Abd soft, nondistended, nontender, bowel sound present CNS: Alert, awake, oriented x 3 Psychiatry: Sad affect Extremities: No pedal, no calf tenderness  Data Review: I have personally reviewed the laboratory data and studies available.  F/u labs ordered Unresulted Labs (From admission, onward)     Start     Ordered   07/13/22 0500  Creatinine, serum  (enoxaparin (LOVENOX)    CrCl >/= 30 ml/min)  Weekly,   R     Comments: while on enoxaparin therapy    07/06/22 1300   07/08/22 7564  Basic metabolic panel  Daily,   R      07/07/22 1432   07/07/22 3329  Basic metabolic panel  Once,   R        07/07/22 1432   07/07/22 1002  Sodium, urine, random  ONCE - URGENT,   URGENT        07/07/22 1001   07/07/22 1002  Osmolality, urine  ONCE - URGENT,   URGENT        07/07/22 1001   07/07/22 1002   Osmolality  ONCE - URGENT,   URGENT        07/07/22 1001            Total time spent in review of labs and imaging, patient evaluation, formulation of plan, documentation and communication with family:  6 minutes  Signed, Terrilee Croak, MD [facility] 07/07/2022

## 2022-07-08 DIAGNOSIS — E871 Hypo-osmolality and hyponatremia: Secondary | ICD-10-CM | POA: Diagnosis not present

## 2022-07-08 LAB — BASIC METABOLIC PANEL
Anion gap: 9 (ref 5–15)
BUN: <5 mg/dL — ABNORMAL LOW (ref 8–23)
CO2: 24 mmol/L (ref 22–32)
Calcium: 8.4 mg/dL — ABNORMAL LOW (ref 8.9–10.3)
Chloride: 96 mmol/L — ABNORMAL LOW (ref 98–111)
Creatinine, Ser: 0.47 mg/dL (ref 0.44–1.00)
GFR, Estimated: >60 mL/min (ref >60)
Glucose, Bld: 99 mg/dL (ref 70–99)
Potassium: 3.7 mmol/L (ref 3.5–5.1)
Sodium: 129 mmol/L — ABNORMAL LOW (ref 135–145)

## 2022-07-08 LAB — OSMOLALITY, URINE: Osmolality, Ur: 311 mOsm/kg (ref 300–900)

## 2022-07-08 MED ORDER — PANTOPRAZOLE SODIUM 40 MG PO TBEC
40.0000 mg | DELAYED_RELEASE_TABLET | Freq: Every day | ORAL | Status: DC
  Administered 2022-07-08 – 2022-07-14 (×7): 40 mg via ORAL
  Filled 2022-07-08 (×7): qty 1

## 2022-07-08 MED ORDER — POLYETHYLENE GLYCOL 3350 17 G PO PACK
17.0000 g | PACK | Freq: Every day | ORAL | Status: DC
  Administered 2022-07-08 – 2022-07-14 (×4): 17 g via ORAL
  Filled 2022-07-08 (×7): qty 1

## 2022-07-08 NOTE — Plan of Care (Signed)
  Problem: Education: Goal: Knowledge of General Education information will improve Description: Including pain rating scale, medication(s)/side effects and non-pharmacologic comfort measures Outcome: Progressing   Problem: Health Behavior/Discharge Planning: Goal: Ability to manage health-related needs will improve Outcome: Progressing   Problem: Activity: Goal: Risk for activity intolerance will decrease Outcome: Progressing   Problem: Nutrition: Goal: Adequate nutrition will be maintained Outcome: Progressing   Problem: Coping: Goal: Level of anxiety will decrease Outcome: Progressing   Problem: Elimination: Goal: Will not experience complications related to bowel motility Outcome: Progressing   Problem: Pain Managment: Goal: General experience of comfort will improve Outcome: Progressing   Problem: Safety: Goal: Ability to remain free from injury will improve Outcome: Progressing   

## 2022-07-08 NOTE — Progress Notes (Signed)
PROGRESS NOTE  Katelyn [name]  DOB: 08-Nov-1960  PCP: Alease Medina, MD BMW:[id number]  DOA: 07/06/2022  LOS: 0 days  Hospital Day: 3  Brief narrative: Katelyn [name] is a 62 y.o. female with PMH significant for HTN, HLD, diastolic CHF, chronic hyponatremia COPD, GERD, anxiety/depression, IBS, interstitial cystitis, migraine, chronic smoking who was recently hospitalized twice. 1/1-1/5 for acute encephalopathy, low sodium level at 112, urinary retention requiring Foley catheter and discharged home. 1/12 to 1/16 for nausea, vomiting or diarrhea leading to hypotension and severe electrolyte derangements.  Her sodium level was low at 104, potassium of 2.2.  He also had significant urinary retention due to Foley catheter malfunction.  She was in circulatory shock and required Levophed briefly.  After clinical and labs improvement, ultimately discharged home on 1/16 on salt tablets However within 24 hours of discharge, patient started complaining of abdominal pain, nausea vomiting again and was brought to the ED by EMS on 1/17  In the ED, patient was afebrile, heart rate in 80s, blood pressure 183/105, breathing on room air  Labs showed sodium level significantly low at 114 which is an acute drop from 131 from 24 hrs ago.  Potassium low at 3.4.  CBC unremarkable CT of the abdomen pelvis with contrast showed no acute abnormality.  Admitted to [facility]  Subjective: Patient was seen and examined this morning.  Pleasant middle-aged Caucasian female.  Looks older for her age.  Not in distress.  Nauseated.  No Abdominal pain.  No vomiting or diarrhea.  Chart reviewed  Assessment and plan: Recurrent severe hyponatremia Patient seems to have chronic hyponatremia at least since 2014. In the last 3 weeks, patient has been hospitalized twice with severe hyponatremia.  In the most recent hospitalization, sodium level was significantly at 104.  Workup there showed hypoosmolar hyponatremia with urine  osmolality around 200, urine sodium level of 71, normal cortisol level, ACTH and TSH.  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 HCTZ was stopped. Patient was discharged on salt tablets and fluid restriction. Presented this time with significantly low sodium level at 114 which is an acute drop from 131 from 24 hours prior.  Sodium level improving already has trended below. Nephrology consult appreciated. Recommended fluid restriction.  Sodium trend as below.  129 this morning Offending meds on hold Recent Labs  Lab 07/03/22 1437 07/03/22 1952 07/04/22 0248 07/05/22 0239 07/06/22 0515 07/06/22 1718 07/06/22 2039 07/07/22 0509 07/07/22 1542 07/08/22 0351  NA 129* 129* 129* 131* 114* 124* 125* 127* 127* 129*    Abdominal pain History of IBS, GERD Patient continues to have abdominal pain that was present during recent admission.  Presumed viral gastroenteritis at that time as symptoms improved without significant intervention.   CT abdomen pelvis showed prominent stool in the colon.  No other acute abnormalities Currently on as needed IV antiemetics.  Continues to have nausea.  EKG 1/12 with QTc 527 ms.  It is when her potassium magnesium level were low. Repeat EKG..  If EKG normal, will start on as needed IV Zofran. Currently on clear liquid diet Resume PPI that she was taking for GERD Resume Bentyl as needed Give 1 dose of MiraLAX today   Urinary retention During her hospitalization on 1/1, she was noted to have urinary retention and Foley catheter was inserted.  In subsequent admission, she was noted to have significant urine retention and hydronephrosis due to catheter malfunction.  Catheter was changed.   Repeat CT scan this admission  showed improvement in retention with some residual right-sided hydronephrosis. Continue Foley catheter   Chronic diastolic CHF Essential hypertension PTA on metoprolol succinate 100 mg daily, amlodipine 10 mg  daily echo performed earlier this month so EF 70-75%, G1 DD, normal RV function. Continue metoprolol and amlodipine.   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  Replacement given.  Improved Continue to monitor Recent Labs  Lab 07/01/22 1859 07/01/22 2238 07/02/22 0139 07/02/22 0438 07/03/22 0313 07/03/22 0319 07/03/22 0853 07/03/22 1437 07/03/22 1952 07/04/22 0248 07/05/22 0235 07/05/22 0239 07/06/22 0515 07/06/22 1718 07/06/22 2039 07/07/22 0509 07/07/22 1542 07/08/22 0351  K 2.2*   < >  --    < >  --    < > 4.4 4.5 4.2 4.0  --  3.6   < > 4.0 3.6 3.5 3.7 3.7  MG 1.4*  --  3.1*  --  2.1  --   --   --   --   --  1.7  --   --  1.8  --   --   --   --   PHOS  --   --   --    < >  --    < > 3.3 3.1 2.9 3.5  --  3.3  --   --   --   --   --   --    < > = values in this interval not displayed.    Mobility: Encourage ambulation  Goals of care   Code Status: DNR    DVT prophylaxis:  enoxaparin (LOVENOX) injection 40 mg Start: 07/06/22 1400   Antimicrobials: None Fluid: Fluid restriction Consultants: Nephrology Family Communication: None at bedside  Scheduled Meds:  amLODipine  10 mg Oral Daily   atorvastatin  40 mg Oral Daily   budesonide (PULMICORT) nebulizer solution  0.25 mg Nebulization BID   Chlorhexidine Gluconate Cloth  6 each Topical Daily   enoxaparin (LOVENOX) injection  40 mg Subcutaneous Q24H   metoprolol succinate  100 mg Oral Daily   pantoprazole  40 mg Oral Daily   sodium chloride flush  3 mL Intravenous Q12H    PRN meds: acetaminophen **OR** acetaminophen, albuterol, dicyclomine, hydrOXYzine, labetalol, polyethylene glycol   Infusions:     Antimicrobials: Anti-infectives (From admission, onward)    None       Skin assessment:      Diet:  Diet Order             Diet clear liquid Room service appropriate? Yes; Fluid consistency: Thin; Fluid restriction: 1500 mL Fluid  Diet effective now                  Nutritional status:  Body mass index is 24.79 kg/m.           Status is: Observation Level of care: Telemetry Medical  Continue in-hospital care because: Needs sodium management  Dispo: The patient is from: Home              Anticipated d/c is to: Pending clinical              Patient currently is not medically stable to d/c.   Difficult to place patient No   Objective: Vitals:  07/08/22 0515 07/08/22 0744  BP: (!) 170/83 (!) 178/73  Pulse: 64 66  Resp: 17 16  Temp: 98.5 F (36.9 C) 98 F (36.7 C)  SpO2:  100%    Intake/Output Summary (Last 24 hours) at 07/08/2022 1246 Last data filed at 07/08/2022 0523 Gross per 24 hour  Intake 165.83 ml  Output 2900 ml  Net -2734.17 ml   Filed Weights   07/06/22 1011 07/08/22 0515  Weight: 68 kg 65.5 kg   Weight change: -2.54 kg Body mass index is 24.79 kg/m.   Physical Exam: General exam: Pleasant, middle-aged Caucasian female. Skin: No rashes, lesions or ulcers. HEENT: Atraumatic, normocephalic, no obvious bleeding Lungs: Clear to auscultation bilaterally CVS: Regular rate and rhythm, normal GI/Abd soft, nondistended, nontender, bowel sound present CNS: Alert, awake, oriented x 3 Psychiatry: Depressed look Extremities: No pedal, no calf tenderness  Data Review: I have personally reviewed the laboratory data and studies available.  F/u labs ordered Unresulted Labs (From admission, onward)     Start     Ordered   07/13/22 0500  Creatinine, serum  (enoxaparin (LOVENOX)    CrCl >/= 30 ml/min)  Weekly,   R     Comments: while on enoxaparin therapy    07/06/22 1300   07/08/22 3016  Basic metabolic panel  Daily,   R       07/07/22 1432            Total time spent in review of labs and imaging, patient evaluation, formulation of plan, documentation and communication with family:  32 minutes  Signed, Terrilee Croak, MD [facility] 07/08/2022

## 2022-07-08 NOTE — Progress Notes (Signed)
Patient ID: Katelyn [name], female   DOB: 1961/03/08, 62 y.o.   MRN: [id number] S: Having nausea this morning. O:BP (!) 178/73 (BP Location: Right Arm)   Pulse 66   Temp 98 F (36.7 C) (Oral)   Resp 16   Ht 5\' 4"  (1.626 m)   Wt 65.5 kg   SpO2 100%   BMI 24.79 kg/m   Intake/Output Summary (Last 24 hours) at 07/08/2022 1204 Last data filed at 07/08/2022 0523 Gross per 24 hour  Intake 165.83 ml  Output 2900 ml  Net -2734.17 ml   Intake/Output: I/O last 3 completed shifts: In: 778.4 [I.V.:778.4] Out: 3400 [Urine:3400]  Intake/Output this shift:  No intake/output data recorded. Weight change: -2.54 kg Gen: NAD CVS: RRR Resp:CTA Abd: +BS, soft, NT/ND Ext: no edema  Recent Labs  Lab 07/01/22 1859 07/01/22 2238 07/02/22 1118 07/02/22 1317 07/02/22 2114 07/03/22 0319 07/03/22 0853 07/03/22 1437 07/03/22 1952 07/04/22 0248 07/05/22 0239 07/06/22 0515 07/06/22 1718 07/06/22 2039 07/07/22 0509 07/07/22 1542 07/08/22 0351  NA 104*   < > 115*   < > 128* 129* 127* 129* 129* 129* 131* 114* 124* 125* 127* 127* 129*  K 2.2*   < > 6.0*   < > 5.0 4.6 4.4 4.5 4.2 4.0 3.6 3.4* 4.0 3.6 3.5 3.7 3.7  CL 66*   < > 89*   < > 98 98 98 100 98 97* 99 80* 89* 93* 95* 96* 96*  CO2 26   < > 24   < > 24 24 25 25 26 26 28 23 25  21* 23 23 24   GLUCOSE 136*   < > 113*   < > 96 120* 103* 105* 111* 100* 92 109* 92 95 92 97 99  BUN 8   < > <5*   < > <5* 5* <5* <5* 5* 6* 6* <5* <5* <5* 5* <5* <5*  CREATININE 0.50   < > 0.53   < > 0.54 0.60 0.55 0.67 0.68 0.73 0.61 0.42* 0.45 0.64 0.65 0.52 0.47  ALBUMIN 3.1*   < > 2.3*   < > 2.7* 2.7* 2.6* 2.6* 2.8* 2.5* 2.6* 3.4*  --   --   --   --   --   CALCIUM 7.1*   < > 6.8*   < > 7.7* 7.7* 7.4* 7.9* 7.9* 7.8* 7.9* 8.7* 8.7* 8.2* 8.2* 8.0* 8.4*  PHOS  --    < >  --    < > 2.9 4.0 3.3 3.1 2.9 3.5 3.3  --   --   --   --   --   --   AST 42*  --  25  --   --   --   --   --   --   --   --  29  --   --   --   --   --   ALT 53*  --  37  --   --   --   --   --   --    --   --  27  --   --   --   --   --    < > = values in this interval not displayed.   Liver Function Tests: Recent Labs  Lab 07/01/22 1859 07/02/22 0139 07/02/22 1118 07/02/22 1317 07/04/22 0248 07/05/22 0239 07/06/22 0515  AST 42*  --  25  --   --   --  29  ALT 53*  --  37  --   --   --  27  ALKPHOS 54  --  44  --   --   --  69  BILITOT 0.9  --  0.6  --   --   --  0.3  PROT 6.0*  --  4.6*  --   --   --  6.8  ALBUMIN 3.1*   < > 2.3*   < > 2.5* 2.6* 3.4*   < > = values in this interval not displayed.   Recent Labs  Lab 07/01/22 1859 07/06/22 0515  LIPASE 28 26   Recent Labs  Lab 07/02/22 1115  AMMONIA 18   CBC: Recent Labs  Lab 07/01/22 1859 07/02/22 0438 07/05/22 0235 07/06/22 0515 07/07/22 0509  WBC 17.6* 11.1* 5.4 10.0 5.4  NEUTROABS 13.8*  --   --  7.8*  --   HGB 11.9* 11.0* 10.0* 12.7 11.5*  HCT 32.7* 30.1* 30.3* 35.6* 33.4*  MCV 82.6 82.9 91.0 85.6 87.2  PLT 271 223 262 312 300   Cardiac Enzymes: No results for input(s): "CKTOTAL", "CKMB", "CKMBINDEX", "TROPONINI" in the last 168 hours. CBG: Recent Labs  Lab 07/02/22 1444 07/02/22 1955  GLUCAP 104* 117*    Iron Studies: No results for input(s): "IRON", "TIBC", "TRANSFERRIN", "FERRITIN" in the last 72 hours. Studies/Results: DG CHEST PORT 1 VIEW  Result Date: 07/07/2022 CLINICAL DATA:  Shortness of breath. Central chest pain and congestion. Cough. EXAM: PORTABLE CHEST 1 VIEW COMPARISON:  07/01/2022 FINDINGS: Atherosclerotic calcification of the aortic arch. Cardiac and mediastinal margins appear normal. Tapering of the peripheral pulmonary vasculature favors emphysema. Otherwise the lungs appear clear. No blunting of the costophrenic angles. Stable appearance of the cervical spine hardware. IMPRESSION: 1. No acute findings. 2.  Emphysema (ICD10-J43.9). Electronically Signed   By: Van Clines M.D.   On: 07/07/2022 14:50    amLODipine  10 mg Oral Daily   atorvastatin  40 mg Oral Daily    budesonide (PULMICORT) nebulizer solution  0.25 mg Nebulization BID   Chlorhexidine Gluconate Cloth  6 each Topical Daily   enoxaparin (LOVENOX) injection  40 mg Subcutaneous Q24H   metoprolol succinate  100 mg Oral Daily   sodium chloride flush  3 mL Intravenous Q12H    BMET    Component Value Date/Time   NA 129 (L) 07/08/2022 0351   NA 132 (L) 10/09/2012 1515   K 3.7 07/08/2022 0351   K 4.1 10/09/2012 1515   CL 96 (L) 07/08/2022 0351   CL 98 10/09/2012 1515   CO2 24 07/08/2022 0351   CO2 30 10/09/2012 1515   GLUCOSE 99 07/08/2022 0351   GLUCOSE 84 10/09/2012 1515   BUN <5 (L) 07/08/2022 0351   BUN 6 (L) 10/09/2012 1515   CREATININE 0.47 07/08/2022 0351   CREATININE 0.64 10/09/2012 1515   CALCIUM 8.4 (L) 07/08/2022 0351   CALCIUM 8.9 10/09/2012 1515   GFRNONAA >60 07/08/2022 0351   GFRNONAA >60 10/09/2012 1515   GFRAA >60 02/04/2020 2322   GFRAA >60 10/09/2012 1515   CBC    Component Value Date/Time   WBC 5.4 07/07/2022 0509   RBC 3.83 (L) 07/07/2022 0509   HGB 11.5 (L) 07/07/2022 0509   HGB 14.2 10/09/2012 1515   HCT 33.4 (L) 07/07/2022 0509   HCT 41.6 10/09/2012 1515   PLT 300 07/07/2022 0509   PLT 189 10/09/2012 1515   MCV 87.2 07/07/2022 0509   MCV 92 10/09/2012 1515   MCH 30.0 07/07/2022 0509  MCHC 34.4 07/07/2022 0509   RDW 12.7 07/07/2022 0509   RDW 12.7 10/09/2012 1515   LYMPHSABS 1.4 07/06/2022 0515   LYMPHSABS 2.4 10/09/2012 1515   MONOABS 0.7 07/06/2022 0515   MONOABS 0.4 10/09/2012 1515   EOSABS 0.0 07/06/2022 0515   EOSABS 0.0 10/09/2012 1515   BASOSABS 0.0 07/06/2022 0515   BASOSABS 0.0 10/09/2012 1515     Assessment/Plan:  Hyponatremia - initially related to HCTZ and SSRI but those were stopped with significant drop in Na level within 24 hours from 131 to 114.  Uosm 311, UNa 91, Sosm 265, urine specific gravity was low at 1.001.  Had normal cortisol level on previous admission.  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  Na improving to 129 with fluid restriction and off of IVF's.  CXR negative for acute pulmonary process but does have evidence of emphysema which could be source.  Would not give NaCl tabs for now given HTN but may add later if needed.  Abdominal pain associated with N/V - CT scan without clear cause.  Plan per primary. Urinary retention - Foley catheter in place will need Urology follow up. HTN - on amlodipine and metoprolol.  HCTZ held due to hyponatremia. Anemia - stable.  Irena Cords, MD [facility]

## 2022-07-09 ENCOUNTER — Observation Stay (HOSPITAL_COMMUNITY): Payer: Medicaid Other

## 2022-07-09 DIAGNOSIS — F419 Anxiety disorder, unspecified: Secondary | ICD-10-CM | POA: Diagnosis present

## 2022-07-09 DIAGNOSIS — E785 Hyperlipidemia, unspecified: Secondary | ICD-10-CM | POA: Diagnosis present

## 2022-07-09 DIAGNOSIS — N1330 Unspecified hydronephrosis: Secondary | ICD-10-CM | POA: Diagnosis present

## 2022-07-09 DIAGNOSIS — G43909 Migraine, unspecified, not intractable, without status migrainosus: Secondary | ICD-10-CM | POA: Diagnosis present

## 2022-07-09 DIAGNOSIS — F17210 Nicotine dependence, cigarettes, uncomplicated: Secondary | ICD-10-CM | POA: Diagnosis present

## 2022-07-09 DIAGNOSIS — E871 Hypo-osmolality and hyponatremia: Secondary | ICD-10-CM | POA: Diagnosis present

## 2022-07-09 DIAGNOSIS — E860 Dehydration: Secondary | ICD-10-CM | POA: Diagnosis present

## 2022-07-09 DIAGNOSIS — Z8249 Family history of ischemic heart disease and other diseases of the circulatory system: Secondary | ICD-10-CM | POA: Diagnosis not present

## 2022-07-09 DIAGNOSIS — R338 Other retention of urine: Secondary | ICD-10-CM | POA: Diagnosis present

## 2022-07-09 DIAGNOSIS — Z841 Family history of disorders of kidney and ureter: Secondary | ICD-10-CM | POA: Diagnosis not present

## 2022-07-09 DIAGNOSIS — H5462 Unqualified visual loss, left eye, normal vision right eye: Secondary | ICD-10-CM | POA: Diagnosis present

## 2022-07-09 DIAGNOSIS — K589 Irritable bowel syndrome without diarrhea: Secondary | ICD-10-CM | POA: Diagnosis present

## 2022-07-09 DIAGNOSIS — E222 Syndrome of inappropriate secretion of antidiuretic hormone: Secondary | ICD-10-CM | POA: Diagnosis present

## 2022-07-09 DIAGNOSIS — Z882 Allergy status to sulfonamides status: Secondary | ICD-10-CM | POA: Diagnosis not present

## 2022-07-09 DIAGNOSIS — Z66 Do not resuscitate: Secondary | ICD-10-CM | POA: Diagnosis present

## 2022-07-09 DIAGNOSIS — A084 Viral intestinal infection, unspecified: Secondary | ICD-10-CM | POA: Diagnosis present

## 2022-07-09 DIAGNOSIS — D649 Anemia, unspecified: Secondary | ICD-10-CM | POA: Diagnosis present

## 2022-07-09 DIAGNOSIS — F32A Depression, unspecified: Secondary | ICD-10-CM | POA: Diagnosis present

## 2022-07-09 DIAGNOSIS — N3281 Overactive bladder: Secondary | ICD-10-CM | POA: Diagnosis present

## 2022-07-09 DIAGNOSIS — I110 Hypertensive heart disease with heart failure: Secondary | ICD-10-CM | POA: Diagnosis present

## 2022-07-09 DIAGNOSIS — E876 Hypokalemia: Secondary | ICD-10-CM | POA: Diagnosis present

## 2022-07-09 DIAGNOSIS — K219 Gastro-esophageal reflux disease without esophagitis: Secondary | ICD-10-CM | POA: Diagnosis present

## 2022-07-09 DIAGNOSIS — I5032 Chronic diastolic (congestive) heart failure: Secondary | ICD-10-CM | POA: Diagnosis present

## 2022-07-09 DIAGNOSIS — J439 Emphysema, unspecified: Secondary | ICD-10-CM | POA: Diagnosis present

## 2022-07-09 DIAGNOSIS — J449 Chronic obstructive pulmonary disease, unspecified: Secondary | ICD-10-CM | POA: Diagnosis present

## 2022-07-09 LAB — BASIC METABOLIC PANEL
Anion gap: 8 (ref 5–15)
BUN: <5 mg/dL — ABNORMAL LOW (ref 8–23)
CO2: 25 mmol/L (ref 22–32)
Calcium: 8.7 mg/dL — ABNORMAL LOW (ref 8.9–10.3)
Chloride: 93 mmol/L — ABNORMAL LOW (ref 98–111)
Creatinine, Ser: 0.57 mg/dL (ref 0.44–1.00)
GFR, Estimated: >60 mL/min (ref >60)
Glucose, Bld: 102 mg/dL — ABNORMAL HIGH (ref 70–99)
Potassium: 3.3 mmol/L — ABNORMAL LOW (ref 3.5–5.1)
Sodium: 126 mmol/L — ABNORMAL LOW (ref 135–145)

## 2022-07-09 LAB — SODIUM
Sodium: 126 mmol/L — ABNORMAL LOW (ref 135–145)
Sodium: 124 mmol/L — ABNORMAL LOW (ref 135–145)
Sodium: 126 mmol/L — ABNORMAL LOW (ref 135–145)
Sodium: 125 mmol/L — ABNORMAL LOW (ref 135–145)

## 2022-07-09 LAB — MAGNESIUM: Magnesium: 1.7 mg/dL (ref 1.7–2.4)

## 2022-07-09 MED ORDER — ZOLPIDEM TARTRATE 5 MG PO TABS: 5.0000 mg | ORAL_TABLET | Freq: Once | ORAL | Status: DC

## 2022-07-09 MED ORDER — BUTALBITAL-APAP-CAFFEINE 50-325-40 MG PO TABS: 1.0000 | ORAL_TABLET | Freq: Once | ORAL | Status: DC

## 2022-07-09 MED ORDER — HYDRALAZINE HCL 25 MG PO TABS
25.0000 mg | ORAL_TABLET | Freq: Three times a day (TID) | ORAL | Status: DC
  Administered 2022-07-09 – 2022-07-14 (×16): 25 mg via ORAL
  Filled 2022-07-09 (×16): qty 1

## 2022-07-09 MED ORDER — POTASSIUM CHLORIDE CRYS ER 20 MEQ PO TBCR
20.0000 meq | EXTENDED_RELEASE_TABLET | Freq: Once | ORAL | Status: AC
  Administered 2022-07-09: 20 meq via ORAL
  Filled 2022-07-09: qty 1

## 2022-07-09 MED ORDER — ONDANSETRON HCL 4 MG/2ML IJ SOLN
4.0000 mg | Freq: Four times a day (QID) | INTRAMUSCULAR | Status: DC | PRN
  Administered 2022-07-09 – 2022-07-11 (×4): 4 mg via INTRAVENOUS
  Filled 2022-07-09 (×5): qty 2

## 2022-07-09 MED ORDER — POTASSIUM CHLORIDE CRYS ER 20 MEQ PO TBCR
40.0000 meq | EXTENDED_RELEASE_TABLET | Freq: Two times a day (BID) | ORAL | Status: DC
  Administered 2022-07-09 (×2): 40 meq via ORAL
  Filled 2022-07-09 (×2): qty 2

## 2022-07-09 MED ORDER — SODIUM CHLORIDE 1 G PO TABS
1.0000 g | ORAL_TABLET | Freq: Two times a day (BID) | ORAL | Status: DC
  Administered 2022-07-09 – 2022-07-11 (×5): 1 g via ORAL
  Filled 2022-07-09 (×6): qty 1

## 2022-07-09 NOTE — Progress Notes (Addendum)
Patient ID: Katelyn [name], female   DOB: [birth year], 62 y.o.   MRN: [id number] S: Does not feel well.  Still with a lot of nausea and poor po intake.  Also reports sudden onset of vision loss in her left eye.  Denied any HA, blurred vision. O:BP (!) 188/91 (BP Location: Left Arm)   Pulse 67   Temp 98.2 F (36.8 C) (Oral)   Resp 16   Ht 5\' 4"  (1.626 m)   Wt 66 kg   SpO2 98%   BMI 24.98 kg/m   Intake/Output Summary (Last 24 hours) at 07/09/2022 1125 Last data filed at 07/09/2022 0528 Gross per 24 hour  Intake 1185 ml  Output 3400 ml  Net -2215 ml   Intake/Output: I/O last 3 completed shifts: In: 1856 [P.O.:1185] Out: 4900 [Urine:4900]  Intake/Output this shift:  No intake/output data recorded. Weight change: 0.5 kg Gen: chronically ill-appearing. CVS:RRR Resp: CTA Abd: +BS, soft, NT/ND Ext: no edema  Recent Labs  Lab 07/02/22 2114 07/03/22 0319 07/03/22 0853 07/03/22 1437 07/03/22 1952 07/04/22 0248 07/05/22 0239 07/06/22 0515 07/06/22 1718 07/06/22 2039 07/07/22 0509 07/07/22 1542 07/08/22 0351 07/09/22 0324  NA 128* 129* 127* 129* 129* 129* 131* 114* 124* 125* 127* 127* 129* 126*  K 5.0 4.6 4.4 4.5 4.2 4.0 3.6 3.4* 4.0 3.6 3.5 3.7 3.7 3.3*  CL 98 98 98 100 98 97* 99 80* 89* 93* 95* 96* 96* 93*  CO2 24 24 25 25 26 26 28 23 25  21* 23 23 24 25   GLUCOSE 96 120* 103* 105* 111* 100* 92 109* 92 95 92 97 99 102*  BUN <5* 5* <5* <5* 5* 6* 6* <5* <5* <5* 5* <5* <5* <5*  CREATININE 0.54 0.60 0.55 0.67 0.68 0.73 0.61 0.42* 0.45 0.64 0.65 0.52 0.47 0.57  ALBUMIN 2.7* 2.7* 2.6* 2.6* 2.8* 2.5* 2.6* 3.4*  --   --   --   --   --   --   CALCIUM 7.7* 7.7* 7.4* 7.9* 7.9* 7.8* 7.9* 8.7* 8.7* 8.2* 8.2* 8.0* 8.4* 8.7*  PHOS 2.9 4.0 3.3 3.1 2.9 3.5 3.3  --   --   --   --   --   --   --   AST  --   --   --   --   --   --   --  29  --   --   --   --   --   --   ALT  --   --   --   --   --   --   --  27  --   --   --   --   --   --    Liver Function Tests: Recent Labs  Lab  07/04/22 0248 07/05/22 0239 07/06/22 0515  AST  --   --  29  ALT  --   --  27  ALKPHOS  --   --  69  BILITOT  --   --  0.3  PROT  --   --  6.8  ALBUMIN 2.5* 2.6* 3.4*   Recent Labs  Lab 07/06/22 0515  LIPASE 26   No results for input(s): "AMMONIA" in the last 168 hours. CBC: Recent Labs  Lab 07/05/22 0235 07/06/22 0515 07/07/22 0509  WBC 5.4 10.0 5.4  NEUTROABS  --  7.8*  --   HGB 10.0* 12.7 11.5*  HCT 30.3* 35.6* 33.4*  MCV 91.0 85.6 87.2  PLT  262 312 300   Cardiac Enzymes: No results for input(s): "CKTOTAL", "CKMB", "CKMBINDEX", "TROPONINI" in the last 168 hours. CBG: Recent Labs  Lab 07/02/22 1444 07/02/22 1955  GLUCAP 104* 117*    Iron Studies: No results for input(s): "IRON", "TIBC", "TRANSFERRIN", "FERRITIN" in the last 72 hours. Studies/Results: DG CHEST PORT 1 VIEW  Result Date: 07/07/2022 CLINICAL DATA:  Shortness of breath. Central chest pain and congestion. Cough. EXAM: PORTABLE CHEST 1 VIEW COMPARISON:  07/01/2022 FINDINGS: Atherosclerotic calcification of the aortic arch. Cardiac and mediastinal margins appear normal. Tapering of the peripheral pulmonary vasculature favors emphysema. Otherwise the lungs appear clear. No blunting of the costophrenic angles. Stable appearance of the cervical spine hardware. IMPRESSION: 1. No acute findings. 2.  Emphysema (ICD10-J43.9). Electronically Signed   By: Van Clines M.D.   On: 07/07/2022 14:50    amLODipine  10 mg Oral Daily   atorvastatin  40 mg Oral Daily   budesonide (PULMICORT) nebulizer solution  0.25 mg Nebulization BID   Chlorhexidine Gluconate Cloth  6 each Topical Daily   enoxaparin (LOVENOX) injection  40 mg Subcutaneous Q24H   metoprolol succinate  100 mg Oral Daily   pantoprazole  40 mg Oral Daily   polyethylene glycol  17 g Oral Daily   potassium chloride  40 mEq Oral BID   sodium chloride flush  3 mL Intravenous Q12H   zolpidem  5 mg Oral Once    BMET    Component Value Date/Time    NA 126 (L) 07/09/2022 0324   NA 132 (L) 10/09/2012 1515   K 3.3 (L) 07/09/2022 0324   K 4.1 10/09/2012 1515   CL 93 (L) 07/09/2022 0324   CL 98 10/09/2012 1515   CO2 25 07/09/2022 0324   CO2 30 10/09/2012 1515   GLUCOSE 102 (H) 07/09/2022 0324   GLUCOSE 84 10/09/2012 1515   BUN <5 (L) 07/09/2022 0324   BUN 6 (L) 10/09/2012 1515   CREATININE 0.57 07/09/2022 0324   CREATININE 0.64 10/09/2012 1515   CALCIUM 8.7 (L) 07/09/2022 0324   CALCIUM 8.9 10/09/2012 1515   GFRNONAA >60 07/09/2022 0324   GFRNONAA >60 10/09/2012 1515   GFRAA >60 02/04/2020 2322   GFRAA >60 10/09/2012 1515   CBC    Component Value Date/Time   WBC 5.4 07/07/2022 0509   RBC 3.83 (L) 07/07/2022 0509   HGB 11.5 (L) 07/07/2022 0509   HGB 14.2 10/09/2012 1515   HCT 33.4 (L) 07/07/2022 0509   HCT 41.6 10/09/2012 1515   PLT 300 07/07/2022 0509   PLT 189 10/09/2012 1515   MCV 87.2 07/07/2022 0509   MCV 92 10/09/2012 1515   MCH 30.0 07/07/2022 0509   MCHC 34.4 07/07/2022 0509   RDW 12.7 07/07/2022 0509   RDW 12.7 10/09/2012 1515   LYMPHSABS 1.4 07/06/2022 0515   LYMPHSABS 2.4 10/09/2012 1515   MONOABS 0.7 07/06/2022 0515   MONOABS 0.4 10/09/2012 1515   EOSABS 0.0 07/06/2022 0515   EOSABS 0.0 10/09/2012 1515   BASOSABS 0.0 07/06/2022 0515   BASOSABS 0.0 10/09/2012 1515    Assessment/Plan:   Hyponatremia - initially related to HCTZ and SSRI but those were stopped with significant drop in Na level within 24 hours from 131 to 114.  Uosm 311, UNa 91, Sosm 265, urine specific gravity was low at 1.001.  Had normal cortisol level on previous admission.  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  Na dropped from 129 to 126  this morning with fluid restriction and off of IVF's.  CXR negative for acute pulmonary process but does have evidence of emphysema which could be source.  I am concerned that she is volume depleted given significant diuresis since admission.  I have ordered  orthostatic vital signs and if positive would resume IV NS and follow sodium closely.  Will try NaCl tabs for now and follow orthostatic Bp's.  Will also need to replace potassium which will help improve sodium levels.  May even consider Ure-Na 15 grams bid.  Also need to increase protein intake given very low BUN.  Will start to check Na levels every 4 hours. Abdominal pain associated with N/V - CT scan without clear cause.  Plan per primary. Acute vision loss of left eye - MRI ordered per primary svc.  Unclear etiology and don't believe electrolyte abnormalities would explain this. Hypokalemia - start KCl 40 mEq bid and follow.  Urinary retention - Foley catheter in place will need Urology follow up. HTN - on amlodipine and metoprolol.  HCTZ held due to hyponatremia.  Check orthostatic vitals and add hydralazine 25 mg tid and follow bp.  Anemia - stable.  Irena Cords, MD [facility]

## 2022-07-09 NOTE — Progress Notes (Addendum)
PROGRESS NOTE    GINI CAPUTO  EHM:[id number] DOB: [birth year] DOA: 07/06/2022 PCP: [name], MD   Brief Narrative:  Katelyn [name] is a 62 y.o. female with PMH significant for HTN, HLD, diastolic CHF, chronic hyponatremia COPD, GERD, anxiety/depression, IBS, interstitial cystitis, migraine, chronic smoking who was recently hospitalized twice. 1/1-1/5 for acute encephalopathy, low sodium level at 112, urinary retention requiring Foley catheter and discharged home. 1/12 to 1/16 for nausea, vomiting or diarrhea leading to hypotension and severe electrolyte derangements.  Her sodium level was low at 104, potassium of 2.2.  He also had significant urinary retention due to Foley catheter malfunction.  She was in circulatory shock and required Levophed briefly.  After clinical and labs improvement, ultimately discharged home on 1/16 on salt tablets However within 24 hours of discharge, patient started complaining of abdominal pain, nausea vomiting again and was brought to the ED by EMS on 1/17   In the ED, patient was afebrile, heart rate in 80s, blood pressure 183/105, breathing on room air   Labs showed sodium level significantly low at 114 which is an acute drop from 131 from 24 hrs ago.  Potassium low at 3.4.  CBC unremarkable CT of the abdomen pelvis with contrast showed no acute abnormality.  Admitted to [place]:   Recurrent severe hyponatremia -Patient seems to have chronic hyponatremia at least since 2014. -In the last 3 weeks, patient has been hospitalized twice with severe hyponatremia.  In the most recent hospitalization, sodium level was significantly at 104.  Workup there showed hypoosmolar hyponatremia with urine osmolality around 200, urine sodium level of 71, normal cortisol level, ACTH and TSH.  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 -HCTZ was stopped. Patient was discharged on salt tablets and fluid restriction. -Presented  this time with significantly low sodium level at 114 which is an acute drop from 131 from 24 hours prior.   -Nephrology consult appreciated -Sodium dropped from 129-126 today-nephrology recommended NaCl tabs for now and follow orthostatic Bp. May consider Ure-Na 15 grams bid. need to increase protein intake given very low BUN.   -Check sodium every 4 hour  Sudden left-sided vision loss: -Patient reports  headache and sudden left eye vision loss this morning. -No neurological deficit noted on exam.  I will get MRI brain stat  Abdominal pain History of IBS, GERD -Patient continues to have abdominal pain that was present during recent admission.  Presumed viral gastroenteritis at that time as symptoms improved without significant intervention.   -CT abdomen pelvis showed prominent stool in the colon.  No other acute abnormalities -Currently on as needed IV antiemetics.  Continues to have nausea.  -Continue as as PPI and Bentyl  Urinary retention -During her hospitalization on 1/1, she was noted to have urinary retention and Foley catheter was inserted.  In subsequent admission, she was noted to have significant urine retention and hydronephrosis due to catheter malfunction.  Catheter was changed.   -Repeat CT scan this admission showed improvement in retention with some residual right-sided hydronephrosis. -Continue Foley catheter   Chronic diastolic CHF Essential hypertension -on metoprolol succinate 100 mg daily, amlodipine 10 mg daily -echo performed earlier this month so EF 70-75%, G1 DD, normal RV function. -Continue metoprolol and amlodipine.    Hyperlipidemia -Continue atorvastatin   COPD -Stable.  On room air.  No wheezing noted on exam.  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  Check magnesium level.  Repeat BMP tomorrow a.m.   DVT prophylaxis: Lovenox Code Status: Full code Family Communication:  None present at bedside.  Plan of care discussed with patient in length and she verbalized understanding and agreed with it. Disposition Plan: To be determined  Consultants:  Nephrology   procedures:  None  Antimicrobials:  None  Status is: Observation    Subjective: Patient seen and examined.  Patient reports headache and sudden onset of left eye vision loss.  That she cannot see anything from her left eye which is new for her.  She denies blurred speech, numbness weakness tingling in upper or lower extremities.  No prior history of stroke.   Objective: Vitals:   07/09/22 0500 07/09/22 0526 07/09/22 0756 07/09/22 0916  BP:  (!) 168/96 (!) 188/91   Pulse:  64 67 67  Resp:  18 18 16   Temp:  98.6 F (37 C) 98.2 F (36.8 C)   TempSrc:  Oral Oral   SpO2:  98% 98% 98%  Weight: 66 kg     Height:        Intake/Output Summary (Last 24 hours) at 07/09/2022 1106 Last data filed at 07/09/2022 0528 Gross per 24 hour  Intake 1185 ml  Output 3400 ml  Net -2215 ml   Filed Weights   07/06/22 1011 07/08/22 0515 07/09/22 0500  Weight: 68 kg 65.5 kg 66 kg    Examination:  General exam: Appears calm and comfortable  Respiratory system: Clear to auscultation. Respiratory effort normal. Cardiovascular system: S1 & S2 heard, RRR. No JVD, murmurs, rubs, gallops or clicks. No pedal edema. Gastrointestinal system: Abdomen is nondistended, soft and nontender. No organomegaly or masses felt. Normal bowel sounds heard. Central nervous system: Alert and oriented. No focal neurological deficits. Extremities: Symmetric 5 x 5 power. Skin: No rashes, lesions or ulcers Psychiatry: Judgement and insight appear normal. Mood & affect appropriate.    Data Reviewed: I have personally reviewed following labs and imaging studies  CBC: Recent  Labs  Lab 07/05/22 0235 07/06/22 0515 07/07/22 0509  WBC 5.4 10.0 5.4  NEUTROABS  --  7.8*  --   HGB 10.0* 12.7 11.5*  HCT 30.3* 35.6* 33.4*  MCV 91.0 85.6 87.2  PLT 262 312 300   Basic Metabolic Panel: Recent Labs  Lab 07/03/22 0313 07/03/22 0319 07/03/22 0853 07/03/22 1437 07/03/22 1952 07/04/22 0248 07/05/22 0235 07/05/22 0239 07/06/22 0515 07/06/22 1718 07/06/22 2039 07/07/22 0509 07/07/22 1542 07/08/22 0351 07/09/22 0324  NA  --    < > 127* 129* 129* 129*  --  131*   < > 124* 125* 127* 127* 129* 126*  K  --    < > 4.4 4.5 4.2 4.0  --  3.6   < > 4.0 3.6 3.5 3.7 3.7 3.3*  CL  --    < > 98 100 98 97*  --  99   < > 89* 93* 95* 96* 96* 93*  CO2  --    < > 25 25 26 26   --  28   < > 25 21* 23 23 24 25   GLUCOSE  --    < > 103* 105* 111* 100*  --  92   < > 92 95 92 97 99 102*  BUN  --    < > <  5* <5* 5* 6*  --  6*   < > <5* <5* 5* <5* <5* <5*  CREATININE  --    < > 0.55 0.67 0.68 0.73  --  0.61   < > 0.45 0.64 0.65 0.52 0.47 0.57  CALCIUM  --    < > 7.4* 7.9* 7.9* 7.8*  --  7.9*   < > 8.7* 8.2* 8.2* 8.0* 8.4* 8.7*  MG 2.1  --   --   --   --   --  1.7  --   --  1.8  --   --   --   --   --   PHOS  --    < > 3.3 3.1 2.9 3.5  --  3.3  --   --   --   --   --   --   --    < > = values in this interval not displayed.   GFR: Estimated Creatinine Clearance: 68.1 mL/min (by C-G formula based on SCr of 0.57 mg/dL). Liver Function Tests: Recent Labs  Lab 07/02/22 1118 07/02/22 1317 07/03/22 1437 07/03/22 1952 07/04/22 0248 07/05/22 0239 07/06/22 0515  AST 25  --   --   --   --   --  29  ALT 37  --   --   --   --   --  27  ALKPHOS 44  --   --   --   --   --  69  BILITOT 0.6  --   --   --   --   --  0.3  PROT 4.6*  --   --   --   --   --  6.8  ALBUMIN 2.3*   < > 2.6* 2.8* 2.5* 2.6* 3.4*   < > = values in this interval not displayed.   Recent Labs  Lab 07/06/22 0515  LIPASE 26   Recent Labs  Lab 07/02/22 1115  AMMONIA 18   Coagulation Profile: No results for  input(s): "INR", "PROTIME" in the last 168 hours. Cardiac Enzymes: No results for input(s): "CKTOTAL", "CKMB", "CKMBINDEX", "TROPONINI" in the last 168 hours. BNP (last 3 results) No results for input(s): "PROBNP" in the last 8760 hours. HbA1C: No results for input(s): "HGBA1C" in the last 72 hours. CBG: Recent Labs  Lab 07/02/22 1444 07/02/22 1955  GLUCAP 104* 117*   Lipid Profile: No results for input(s): "CHOL", "HDL", "LDLCALC", "TRIG", "CHOLHDL", "LDLDIRECT" in the last 72 hours. Thyroid Function Tests: No results for input(s): "TSH", "T4TOTAL", "FREET4", "T3FREE", "THYROIDAB" in the last 72 hours. Anemia Panel: No results for input(s): "VITAMINB12", "FOLATE", "FERRITIN", "TIBC", "IRON", "RETICCTPCT" in the last 72 hours. Sepsis Labs: No results for input(s): "PROCALCITON", "LATICACIDVEN" in the last 168 hours.  Recent Results (from the past 240 hour(s))  Resp panel by RT-PCR (RSV, Flu A&B, Covid) Nasopharyngeal Swab     Status: None   Collection Time: 07/01/22  7:36 PM   Specimen: Nasopharyngeal Swab; Nasal Swab  Result Value Ref Range Status   SARS Coronavirus 2 by RT PCR NEGATIVE NEGATIVE Final    Comment: (NOTE) SARS-CoV-2 target nucleic acids are NOT DETECTED.  The SARS-CoV-2 RNA is generally detectable in upper respiratory specimens during the acute phase of infection. The lowest concentration of SARS-CoV-2 viral copies this assay can detect is 138 copies/mL. A negative result does not preclude SARS-Cov-2 infection and should not be used as the sole basis for treatment or other patient management decisions. A  negative result may occur with  improper specimen collection/handling, submission of specimen other than nasopharyngeal swab, presence of viral mutation(s) within the areas targeted by this assay, and inadequate number of viral copies(<138 copies/mL). A negative result must be combined with clinical observations, patient history, and  epidemiological information. The expected result is Negative.  Fact Sheet for Patients:  EntrepreneurPulse.com.au  Fact Sheet for Healthcare Providers:  IncredibleEmployment.be  This test is no t yet approved or cleared by the [place] FDA and  has been authorized for detection and/or diagnosis of SARS-CoV-2 by FDA under an Emergency Use Authorization (EUA). This EUA will remain  in effect (meaning this test can be used) for the duration of the COVID-19 declaration under Section 564(b)(1) of the Act, 21 U.S.C.section 360bbb-3(b)(1), unless the authorization is terminated  or revoked sooner.       Influenza A by PCR NEGATIVE NEGATIVE Final   Influenza B by PCR NEGATIVE NEGATIVE Final    Comment: (NOTE) The Xpert Xpress SARS-CoV-2/FLU/RSV plus assay is intended as an aid in the diagnosis of influenza from Nasopharyngeal swab specimens and should not be used as a sole basis for treatment. Nasal washings and aspirates are unacceptable for Xpert Xpress SARS-CoV-2/FLU/RSV testing.  Fact Sheet for Patients: EntrepreneurPulse.com.au  Fact Sheet for Healthcare Providers: IncredibleEmployment.be  This test is not yet approved or cleared by the [place] FDA and has been authorized for detection and/or diagnosis of SARS-CoV-2 by FDA under an Emergency Use Authorization (EUA). This EUA will remain in effect (meaning this test can be used) for the duration of the COVID-19 declaration under Section 564(b)(1) of the Act, 21 U.S.C. section 360bbb-3(b)(1), unless the authorization is terminated or revoked.     Resp Syncytial Virus by PCR NEGATIVE NEGATIVE Final    Comment: (NOTE) Fact Sheet for Patients: EntrepreneurPulse.com.au  Fact Sheet for Healthcare Providers: IncredibleEmployment.be  This test is not yet approved or cleared by the [place] FDA and has been  authorized for detection and/or diagnosis of SARS-CoV-2 by FDA under an Emergency Use Authorization (EUA). This EUA will remain in effect (meaning this test can be used) for the duration of the COVID-19 declaration under Section 564(b)(1) of the Act, 21 U.S.C. section 360bbb-3(b)(1), unless the authorization is terminated or revoked.  Performed at [facility], [address] [place], [place] [address]   Blood culture (routine x 2)     Status: None   Collection Time: 07/01/22  7:36 PM   Specimen: BLOOD  Result Value Ref Range Status   Specimen Description BLOOD BLOOD RIGHT ARM  Final   Special Requests   Final    BOTTLES DRAWN AEROBIC AND ANAEROBIC Blood Culture adequate volume   Culture   Final    NO GROWTH 5 DAYS Performed at [facility], [address] [place], [place] [address]    Report Status 07/06/2022 FINAL  Final  Blood culture (routine x 2)     Status: None   Collection Time: 07/01/22  7:36 PM   Specimen: BLOOD  Result Value Ref Range Status   Specimen Description BLOOD BLOOD RIGHT HAND  Final   Special Requests   Final    BOTTLES DRAWN AEROBIC AND ANAEROBIC Blood Culture adequate volume   Culture   Final    NO GROWTH 5 DAYS Performed at [facility], [address] [place], [place] [address]    Report Status 07/06/2022 FINAL  Final  Urine Culture     Status: None  Collection Time: 07/01/22 11:54 PM   Specimen: Urine, Catheterized  Result Value Ref Range Status   Specimen Description   Final    URINE, CATHETERIZED Performed at [facility], [address] [place], [place] [address]    Special Requests   Final    Normal Performed at [facility], [address] [place], [place] [address]    Culture   Final    NO GROWTH Performed at [facility], [address], [place], [place] [address]    Report Status 07/03/2022 FINAL  Final  MRSA Next Gen by PCR, Nasal     Status: None   Collection Time:  07/02/22  8:00 PM   Specimen: Nasal Mucosa; Nasal Swab  Result Value Ref Range Status   MRSA by PCR Next Gen NOT DETECTED NOT DETECTED Final    Comment: (NOTE) The GeneXpert MRSA Assay (FDA approved for NASAL specimens only), is one component of a comprehensive MRSA colonization surveillance program. It is not intended to diagnose MRSA infection nor to guide or monitor treatment for MRSA infections. Test performance is not FDA approved in patients less than 26 years old. Performed at [facility], [address] [place], [place] [address]       Radiology Studies: DG CHEST PORT 1 VIEW  Result Date: 07/07/2022 CLINICAL DATA:  Shortness of breath. Central chest pain and congestion. Cough. EXAM: PORTABLE CHEST 1 VIEW COMPARISON:  07/01/2022 FINDINGS: Atherosclerotic calcification of the aortic arch. Cardiac and mediastinal margins appear normal. Tapering of the peripheral pulmonary vasculature favors emphysema. Otherwise the lungs appear clear. No blunting of the costophrenic angles. Stable appearance of the cervical spine hardware. IMPRESSION: 1. No acute findings. 2.  Emphysema (ICD10-J43.9). Electronically Signed   By: Gaylyn Rong M.D.   On: 07/07/2022 14:50    Scheduled Meds:  amLODipine  10 mg Oral Daily   atorvastatin  40 mg Oral Daily   budesonide (PULMICORT) nebulizer solution  0.25 mg Nebulization BID   Chlorhexidine Gluconate Cloth  6 each Topical Daily   enoxaparin (LOVENOX) injection  40 mg Subcutaneous Q24H   metoprolol succinate  100 mg Oral Daily   pantoprazole  40 mg Oral Daily   polyethylene glycol  17 g Oral Daily   potassium chloride  40 mEq Oral BID   sodium chloride flush  3 mL Intravenous Q12H   zolpidem  5 mg Oral Once   Continuous Infusions:   LOS: 0 days   Time spent: 40 minutes   Monic Engelmann Estill [place], MD [facility]  If 7PM-7AM, please contact night-coverage [email or web address] 07/09/2022, 11:06 AM

## 2022-07-10 DIAGNOSIS — E871 Hypo-osmolality and hyponatremia: Secondary | ICD-10-CM | POA: Diagnosis not present

## 2022-07-10 LAB — BASIC METABOLIC PANEL
Anion gap: 7 (ref 5–15)
BUN: <5 mg/dL — ABNORMAL LOW (ref 8–23)
CO2: 22 mmol/L (ref 22–32)
Calcium: 8.8 mg/dL — ABNORMAL LOW (ref 8.9–10.3)
Chloride: 95 mmol/L — ABNORMAL LOW (ref 98–111)
Creatinine, Ser: 0.72 mg/dL (ref 0.44–1.00)
GFR, Estimated: >60 mL/min (ref >60)
Glucose, Bld: 105 mg/dL — ABNORMAL HIGH (ref 70–99)
Potassium: 5.1 mmol/L (ref 3.5–5.1)
Sodium: 124 mmol/L — ABNORMAL LOW (ref 135–145)

## 2022-07-10 LAB — CBC
HCT: 36 % (ref 36.0–46.0)
Hemoglobin: 12.4 g/dL (ref 12.0–15.0)
MCH: 30.2 pg (ref 26.0–34.0)
MCHC: 34.4 g/dL (ref 30.0–36.0)
MCV: 87.8 fL (ref 80.0–100.0)
Platelets: 315 10*3/uL (ref 150–400)
RBC: 4.1 MIL/uL (ref 3.87–5.11)
RDW: 12.9 % (ref 11.5–15.5)
WBC: 5.9 10*3/uL (ref 4.0–10.5)
nRBC: 0 % (ref 0.0–0.2)

## 2022-07-10 LAB — SODIUM
Sodium: 125 mmol/L — ABNORMAL LOW (ref 135–145)
Sodium: 122 mmol/L — ABNORMAL LOW (ref 135–145)

## 2022-07-10 MED ORDER — BUTALBITAL-APAP-CAFFEINE 50-325-40 MG PO TABS
1.0000 | ORAL_TABLET | Freq: Every day | ORAL | Status: DC | PRN
  Administered 2022-07-10 – 2022-07-14 (×5): 1 via ORAL
  Filled 2022-07-10 (×5): qty 1

## 2022-07-10 NOTE — Plan of Care (Signed)

## 2022-07-10 NOTE — Progress Notes (Signed)
Patient ID: Katelyn [name], female   DOB: 08-Mar-1961, 62 y.o.   MRN: [id number] S: Still feels "terrible".  STill with nausea, anorexia, and left eye vision loss. O:BP (!) 157/83 (BP Location: Left Arm)   Pulse 72   Temp 97.8 F (36.6 C) (Oral)   Resp 15   Ht 5\' 4"  (1.626 m)   Wt 71.9 kg   SpO2 99%   BMI 27.21 kg/m   Intake/Output Summary (Last 24 hours) at 07/10/2022 1143 Last data filed at 07/10/2022 0419 Gross per 24 hour  Intake 360 ml  Output 950 ml  Net -590 ml   Intake/Output: I/O last 3 completed shifts: In: 360 [P.O.:360] Out: 2000 [Urine:2000]  Intake/Output this shift:  No intake/output data recorded. Weight change: 5.9 kg Gen: chronically ill-appearing, NAD CVS: RRR Resp:CTA Abd: +BS, soft, NT/ND Ext: no edema  Recent Labs  Lab 07/03/22 1437 07/03/22 1952 07/04/22 0248 07/05/22 0239 07/06/22 0515 07/06/22 1718 07/06/22 2039 07/07/22 0509 07/07/22 1542 07/08/22 0351 07/09/22 0324 07/09/22 1145 07/09/22 1508 07/09/22 1926 07/09/22 2256 07/10/22 0306 07/10/22 0724  NA 129* 129* 129* 131* 114* 124* 125* 127* 127* 129* 126* 126* 124* 126* 125* 124* 125*  K 4.5 4.2 4.0 3.6 3.4* 4.0 3.6 3.5 3.7 3.7 3.3*  --   --   --   --  5.1  --   CL 100 98 97* 99 80* 89* 93* 95* 96* 96* 93*  --   --   --   --  95*  --   CO2 25 26 26 28 23 25  21* 23 23 24 25   --   --   --   --  22  --   GLUCOSE 105* 111* 100* 92 109* 92 95 92 97 99 102*  --   --   --   --  105*  --   BUN <5* 5* 6* 6* <5* <5* <5* 5* <5* <5* <5*  --   --   --   --  <5*  --   CREATININE 0.67 0.68 0.73 0.61 0.42* 0.45 0.64 0.65 0.52 0.47 0.57  --   --   --   --  0.72  --   ALBUMIN 2.6* 2.8* 2.5* 2.6* 3.4*  --   --   --   --   --   --   --   --   --   --   --   --   CALCIUM 7.9* 7.9* 7.8* 7.9* 8.7* 8.7* 8.2* 8.2* 8.0* 8.4* 8.7*  --   --   --   --  8.8*  --   PHOS 3.1 2.9 3.5 3.3  --   --   --   --   --   --   --   --   --   --   --   --   --   AST  --   --   --   --  29  --   --   --   --   --   --   --    --   --   --   --   --   ALT  --   --   --   --  27  --   --   --   --   --   --   --   --   --   --   --   --  Liver Function Tests: Recent Labs  Lab 07/04/22 0248 07/05/22 0239 07/06/22 0515  AST  --   --  29  ALT  --   --  27  ALKPHOS  --   --  69  BILITOT  --   --  0.3  PROT  --   --  6.8  ALBUMIN 2.5* 2.6* 3.4*   Recent Labs  Lab 07/06/22 0515  LIPASE 26   No results for input(s): "AMMONIA" in the last 168 hours. CBC: Recent Labs  Lab 07/05/22 0235 07/06/22 0515 07/07/22 0509 07/10/22 0306  WBC 5.4 10.0 5.4 5.9  NEUTROABS  --  7.8*  --   --   HGB 10.0* 12.7 11.5* 12.4  HCT 30.3* 35.6* 33.4* 36.0  MCV 91.0 85.6 87.2 87.8  PLT 262 312 300 315   Cardiac Enzymes: No results for input(s): "CKTOTAL", "CKMB", "CKMBINDEX", "TROPONINI" in the last 168 hours. CBG: No results for input(s): "GLUCAP" in the last 168 hours.  Iron Studies: No results for input(s): "IRON", "TIBC", "TRANSFERRIN", "FERRITIN" in the last 72 hours. Studies/Results: MR BRAIN WO CONTRAST  Result Date: 07/09/2022 CLINICAL DATA:  Vision loss, monocular EXAM: MRI HEAD WITHOUT CONTRAST TECHNIQUE: Multiplanar, multiecho pulse sequences of the brain and surrounding structures were obtained without intravenous contrast. COMPARISON:  CT head 07/02/2022. FINDINGS: Brain: No acute infarction, hemorrhage, hydrocephalus, extra-axial collection or mass lesion. Vascular: Major arterial flow voids are maintained the skull base. Skull and upper cervical spine: Normal marrow signal. Sinuses/Orbits: Negative. Other: No mastoid effusions. IMPRESSION: No evidence of acute intracranial abnormality. Electronically Signed   By: Margaretha Sheffield M.D.   On: 07/09/2022 14:05    amLODipine  10 mg Oral Daily   atorvastatin  40 mg Oral Daily   budesonide (PULMICORT) nebulizer solution  0.25 mg Nebulization BID   Chlorhexidine Gluconate Cloth  6 each Topical Daily   enoxaparin (LOVENOX) injection  40 mg Subcutaneous Q24H    hydrALAZINE  25 mg Oral Q8H   metoprolol succinate  100 mg Oral Daily   pantoprazole  40 mg Oral Daily   polyethylene glycol  17 g Oral Daily   sodium chloride flush  3 mL Intravenous Q12H   sodium chloride  1 g Oral BID WC   zolpidem  5 mg Oral Once    BMET    Component Value Date/Time   NA 125 (L) 07/10/2022 0724   NA 132 (L) 10/09/2012 1515   K 5.1 07/10/2022 0306   K 4.1 10/09/2012 1515   CL 95 (L) 07/10/2022 0306   CL 98 10/09/2012 1515   CO2 22 07/10/2022 0306   CO2 30 10/09/2012 1515   GLUCOSE 105 (H) 07/10/2022 0306   GLUCOSE 84 10/09/2012 1515   BUN <5 (L) 07/10/2022 0306   BUN 6 (L) 10/09/2012 1515   CREATININE 0.72 07/10/2022 0306   CREATININE 0.64 10/09/2012 1515   CALCIUM 8.8 (L) 07/10/2022 0306   CALCIUM 8.9 10/09/2012 1515   GFRNONAA >60 07/10/2022 0306   GFRNONAA >60 10/09/2012 1515   GFRAA >60 02/04/2020 2322   GFRAA >60 10/09/2012 1515   CBC    Component Value Date/Time   WBC 5.9 07/10/2022 0306   RBC 4.10 07/10/2022 0306   HGB 12.4 07/10/2022 0306   HGB 14.2 10/09/2012 1515   HCT 36.0 07/10/2022 0306   HCT 41.6 10/09/2012 1515   PLT 315 07/10/2022 0306   PLT 189 10/09/2012 1515   MCV 87.8 07/10/2022 0306   MCV 92 10/09/2012 1515  MCH 30.2 07/10/2022 0306   MCHC 34.4 07/10/2022 0306   RDW 12.9 07/10/2022 0306   RDW 12.7 10/09/2012 1515   LYMPHSABS 1.4 07/06/2022 0515   LYMPHSABS 2.4 10/09/2012 1515   MONOABS 0.7 07/06/2022 0515   MONOABS 0.4 10/09/2012 1515   EOSABS 0.0 07/06/2022 0515   EOSABS 0.0 10/09/2012 1515   BASOSABS 0.0 07/06/2022 0515   BASOSABS 0.0 10/09/2012 1515    Assessment/Plan:   Hyponatremia - initially related to HCTZ and SSRI but those were stopped with significant drop in Na level within 24 hours from 131 to 114.  Uosm 311, UNa 91, Sosm 265, urine specific gravity was low at 1.001.  Had normal cortisol level on previous admission.  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  Na  dropped from 129 to 126 this morning with fluid restriction and off of IVF's.  CXR negative for acute pulmonary process but does have evidence of emphysema which could be source.  I am concerned that she is volume depleted given significant diuresis since admission.  Orthostatic vital signs were negative.  Sodium not significantly improved with NaCl tabs, however ure-Na tabs associated with nausea and she remains nauseated.  Plan to continue with NaCl tabs today and if not improving would consider resuming IV NS (off of NaCl tabs) and possible dose of furosemide to force hypotonic diuresis.  Also need to increase protein intake given very low BUN.  Will start to check Na levels every 6 hours. Abdominal pain associated with N/V - CT scan without clear cause.  Plan per primary. Acute vision loss of left eye - MRI ordered per primary svc.  Unclear etiology and don't believe electrolyte abnormalities would explain this. Hypokalemia - started KCl 40 mEq bid and improved to 5.1.  Will D/c KCl. Urinary retention - Foley catheter in place will need Urology follow up. HTN - on amlodipine and metoprolol.  HCTZ held due to hyponatremia.  Check orthostatic vitals and add hydralazine 25 mg tid and follow bp.  Anemia - stable.  Donetta Potts, MD [facility]

## 2022-07-10 NOTE — Progress Notes (Signed)
PROGRESS NOTE    Katelyn [name]  ELF:[id number] DOB: 01-08-1961 DOA: 07/06/2022 PCP: [name], MD   Brief Narrative:  Katelyn [name] is a 62 y.o. female with PMH significant for HTN, HLD, diastolic CHF, chronic hyponatremia COPD, GERD, anxiety/depression, IBS, interstitial cystitis, migraine, chronic smoking who was recently hospitalized twice. 1/1-1/5 for acute encephalopathy, low sodium level at 112, urinary retention requiring Foley catheter and discharged home. 1/12 to 1/16 for nausea, vomiting or diarrhea leading to hypotension and severe electrolyte derangements.  Her sodium level was low at 104, potassium of 2.2.  He also had significant urinary retention due to Foley catheter malfunction.  She was in circulatory shock and required Levophed briefly.  After clinical and labs improvement, ultimately discharged home on 1/16 on salt tablets However within 24 hours of discharge, patient started complaining of abdominal pain, nausea vomiting again and was brought to the ED by EMS on 1/17   In the ED, patient was afebrile, heart rate in 80s, blood pressure 183/105, breathing on room air   Labs showed sodium level significantly low at 114 which is an acute drop from 131 from 24 hrs ago.  Potassium low at 3.4.  CBC unremarkable CT of the abdomen pelvis with contrast showed no acute abnormality.  Admitted to [facility]:   Recurrent severe hyponatremia -Patient seems to have chronic hyponatremia at least since 2014. -In the last 3 weeks, patient has been hospitalized twice with severe hyponatremia.  In the most recent hospitalization, sodium level was significantly at 104.  Workup there showed hypoosmolar hyponatremia with urine osmolality around 200, urine sodium level of 71, normal cortisol level, ACTH and TSH.  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 -HCTZ was stopped. Patient was discharged on salt tablets and fluid restriction. -Presented  this time with significantly low sodium level at 114 which is an acute drop from 131 from 24 hours prior.   -Nephrology consult appreciated -Sodium ranges from 124 to 126 -nephrology recommended NaCl tabs for now and follow orthostatic BP. May consider Ure-Na 15 grams bid. need to increase protein intake given very low BUN.   -Check sodium every 4 hour  Sudden left-sided vision loss: -1/20: patient reports  headache and sudden left eye vision loss this morning. -No neurological deficit noted on exam.  MRI brain ordered that resulted negative for any acute findings.  Abdominal pain History of IBS, GERD -Patient continues to have abdominal pain that was present during recent admission.  Presumed viral gastroenteritis at that time as symptoms improved without significant intervention.   -CT abdomen pelvis showed prominent stool in the colon.  No other acute abnormalities -Currently on as needed IV antiemetics.  Continues to have nausea.  -Continue as needed PPI and Bentyl  Urinary retention -During her hospitalization on 1/1, she was noted to have urinary retention and Foley catheter was inserted.  In subsequent admission, she was noted to have significant urine retention and hydronephrosis due to catheter malfunction.  Catheter was changed.   -Repeat CT scan this admission showed improvement in retention with some residual right-sided hydronephrosis. -Continue Foley catheter   Chronic diastolic CHF Essential hypertension -on metoprolol succinate 100 mg daily, amlodipine 10 mg daily -echo performed earlier this month so EF 70-75%, G1 DD, normal RV function. -Continue metoprolol and amlodipine.    Hyperlipidemia -Continue atorvastatin   COPD -Stable.  On room air.  No wheezing noted on exam.  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  Check magnesium level.  Repeat BMP tomorrow a.m.   DVT prophylaxis: Lovenox Code Status: Full code Family Communication:  None present at bedside.  Plan of care discussed with patient in length and she verbalized understanding and agreed with it. Disposition Plan: To be determined  Consultants:  Nephrology   procedures:  None  Antimicrobials:  None  Status is: inpatient    Subjective: Patient seen and examined.  Resting comfortably on the bed.  Continues to have left eye vision loss and headache.  Requested to resume Fioricet that she takes at home for migraine headache.  We discussed about her negative MRI result.  She also mentioned about her chronic nausea and stomach discomfort.  No vomiting, diarrhea or constipation.  Remained afebrile.  No acute events overnight.   Objective: Vitals:   07/09/22 2041 07/10/22 0419 07/10/22 0500 07/10/22 0755  BP:  (!) 155/83  (!) 157/83  Pulse: 68 67  72  Resp: 14   15  Temp:  98.7 F (37.1 C)  97.8 F (36.6 C)  TempSrc:  Oral  Oral  SpO2: 99% 100%  99%  Weight:   71.9 kg   Height:        Intake/Output Summary (Last 24 hours) at 07/10/2022 1104 Last data filed at 07/10/2022 0419 Gross per 24 hour  Intake 360 ml  Output 950 ml  Net -590 ml    Filed Weights   07/08/22 0515 07/09/22 0500 07/10/22 0500  Weight: 65.5 kg 66 kg 71.9 kg    Examination:  General exam: Appears calm and comfortable, on room air, communicating well Respiratory system: Clear to auscultation. Respiratory effort normal. Cardiovascular system: S1 & S2 heard, RRR. No JVD, murmurs, rubs, gallops or clicks. No pedal edema. Gastrointestinal system: Abdomen is nondistended, soft and nontender. No organomegaly or masses felt. Normal bowel sounds heard. Central nervous system: Alert and oriented. No focal neurological deficits. Extremities: Symmetric 5 x 5 power. Skin: No rashes, lesions  or ulcers Psychiatry: Judgement and insight appear normal. Mood & affect appropriate.    Data Reviewed: I have personally reviewed following labs and imaging studies  CBC: Recent Labs  Lab 07/05/22 0235 07/06/22 0515 07/07/22 0509 07/10/22 0306  WBC 5.4 10.0 5.4 5.9  NEUTROABS  --  7.8*  --   --   HGB 10.0* 12.7 11.5* 12.4  HCT 30.3* 35.6* 33.4* 36.0  MCV 91.0 85.6 87.2 87.8  PLT 262 312 300 742    Basic Metabolic Panel: Recent Labs  Lab 07/03/22 1437 07/03/22 1952 07/04/22 0248 07/05/22 0235 07/05/22 0239 07/06/22 0515 07/06/22 1718 07/06/22 2039 07/07/22 0509 07/07/22 1542 07/08/22 0351 07/09/22 0324 07/09/22 1145 07/09/22 1508 07/09/22 1926 07/09/22 2256 07/10/22 0306 07/10/22 0724  NA 129* 129* 129*  --  131*   < > 124*   < > 127* 127* 129* 126* 126* 124* 126* 125* 124* 125*  K 4.5 4.2 4.0  --  3.6   < > 4.0   < > 3.5 3.7 3.7 3.3*  --   --   --   --  5.1  --   CL 100 98 97*  --  99   < > 89*   < > 95* 96* 96* 93*  --   --   --   --  95*  --   CO2 25 26 26   --  28   < > 25   < > 23 23 24 25   --   --   --   --  22  --   GLUCOSE 105* 111* 100*  --  92   < > 92   < > 92 97 99 102*  --   --   --   --  105*  --   BUN <5* 5* 6*  --  6*   < > <5*   < > 5* <5* <5* <5*  --   --   --   --  <5*  --   CREATININE 0.67 0.68 0.73  --  0.61   < > 0.45   < > 0.65 0.52 0.47 0.57  --   --   --   --  0.72  --   CALCIUM 7.9* 7.9* 7.8*  --  7.9*   < > 8.7*   < > 8.2* 8.0* 8.4* 8.7*  --   --   --   --  8.8*  --   MG  --   --   --  1.7  --   --  1.8  --   --   --   --   --  1.7  --   --   --   --   --   PHOS 3.1 2.9 3.5  --  3.3  --   --   --   --   --   --   --   --   --   --   --   --   --    < > = values in this interval not displayed.    GFR: Estimated Creatinine Clearance: 70.9 mL/min (by C-G formula based on SCr of 0.72 mg/dL). Liver Function Tests: Recent Labs  Lab 07/03/22 1437 07/03/22 1952 07/04/22 0248 07/05/22 0239 07/06/22 0515  AST  --   --   --   --  29   ALT  --   --   --   --  27  ALKPHOS  --   --   --   --  69  BILITOT  --   --   --   --  0.3  PROT  --   --   --   --  6.8  ALBUMIN 2.6* 2.8* 2.5* 2.6* 3.4*    Recent Labs  Lab 07/06/22 0515  LIPASE 26    No results for input(s): "AMMONIA" in the last 168 hours.  Coagulation Profile: No results for input(s): "INR", "PROTIME" in the last 168 hours. Cardiac Enzymes: No results for input(s): "CKTOTAL", "CKMB", "CKMBINDEX", "TROPONINI" in the last 168 hours. BNP (last 3 results) No results for input(s): "PROBNP" in the last 8760 hours. HbA1C: No results for input(s): "HGBA1C" in the last 72 hours. CBG: No results for input(s): "GLUCAP" in the last 168 hours.  Lipid Profile: No results for input(s): "CHOL", "HDL", "LDLCALC", "TRIG", "CHOLHDL", "LDLDIRECT" in the last 72 hours. Thyroid Function Tests: No results for input(s): "TSH", "T4TOTAL", "FREET4", "T3FREE", "THYROIDAB" in the last 72 hours. Anemia Panel: No results for input(s): "VITAMINB12", "FOLATE", "FERRITIN", "TIBC", "IRON", "RETICCTPCT" in the last 72 hours. Sepsis Labs: No results for input(s): "PROCALCITON", "LATICACIDVEN" in the last 168 hours.  Recent Results (from the past 240 hour(s))  Resp panel by RT-PCR (RSV, Flu A&B, Covid) Nasopharyngeal Swab     Status:  None   Collection Time: 07/01/22  7:36 PM   Specimen: Nasopharyngeal Swab; Nasal Swab  Result Value Ref Range Status   SARS Coronavirus 2 by RT PCR NEGATIVE NEGATIVE Final    Comment: (NOTE) SARS-CoV-2 target nucleic acids are NOT DETECTED.  The SARS-CoV-2 RNA is generally detectable in upper respiratory specimens during the acute phase of infection. The lowest concentration of SARS-CoV-2 viral copies this assay can detect is 138 copies/mL. A negative result does not preclude SARS-Cov-2 infection and should not be used as the sole basis for treatment or other patient management decisions. A negative result may occur with  improper specimen  collection/handling, submission of specimen other than nasopharyngeal swab, presence of viral mutation(s) within the areas targeted by this assay, and inadequate number of viral copies(<138 copies/mL). A negative result must be combined with clinical observations, patient history, and epidemiological information. The expected result is Negative.  Fact Sheet for Patients:  BloggerCourse.com  Fact Sheet for Healthcare Providers:  SeriousBroker.it  This test is no t yet approved or cleared by the [place] FDA and  has been authorized for detection and/or diagnosis of SARS-CoV-2 by FDA under an Emergency Use Authorization (EUA). This EUA will remain  in effect (meaning this test can be used) for the duration of the COVID-19 declaration under Section 564(b)(1) of the Act, 21 U.S.C.section 360bbb-3(b)(1), unless the authorization is terminated  or revoked sooner.       Influenza A by PCR NEGATIVE NEGATIVE Final   Influenza B by PCR NEGATIVE NEGATIVE Final    Comment: (NOTE) The Xpert Xpress SARS-CoV-2/FLU/RSV plus assay is intended as an aid in the diagnosis of influenza from Nasopharyngeal swab specimens and should not be used as a sole basis for treatment. Nasal washings and aspirates are unacceptable for Xpert Xpress SARS-CoV-2/FLU/RSV testing.  Fact Sheet for Patients: BloggerCourse.com  Fact Sheet for Healthcare Providers: SeriousBroker.it  This test is not yet approved or cleared by the [place] FDA and has been authorized for detection and/or diagnosis of SARS-CoV-2 by FDA under an Emergency Use Authorization (EUA). This EUA will remain in effect (meaning this test can be used) for the duration of the COVID-19 declaration under Section 564(b)(1) of the Act, 21 U.S.C. section 360bbb-3(b)(1), unless the authorization is terminated or revoked.     Resp Syncytial  Virus by PCR NEGATIVE NEGATIVE Final    Comment: (NOTE) Fact Sheet for Patients: BloggerCourse.com  Fact Sheet for Healthcare Providers: SeriousBroker.it  This test is not yet approved or cleared by the [place] FDA and has been authorized for detection and/or diagnosis of SARS-CoV-2 by FDA under an Emergency Use Authorization (EUA). This EUA will remain in effect (meaning this test can be used) for the duration of the COVID-19 declaration under Section 564(b)(1) of the Act, 21 U.S.C. section 360bbb-3(b)(1), unless the authorization is terminated or revoked.  Performed at [facility], [address] [place], [place] [address]   Blood culture (routine x 2)     Status: None   Collection Time: 07/01/22  7:36 PM   Specimen: BLOOD  Result Value Ref Range Status   Specimen Description BLOOD BLOOD RIGHT ARM  Final   Special Requests   Final    BOTTLES DRAWN AEROBIC AND ANAEROBIC Blood Culture adequate volume   Culture   Final    NO GROWTH 5 DAYS Performed at [facility], [address], [place], [place] [address]    Report Status 07/06/2022 FINAL  Final  Blood culture (  routine x 2)     Status: None   Collection Time: 07/01/22  7:36 PM   Specimen: BLOOD  Result Value Ref Range Status   Specimen Description BLOOD BLOOD RIGHT HAND  Final   Special Requests   Final    BOTTLES DRAWN AEROBIC AND ANAEROBIC Blood Culture adequate volume   Culture   Final    NO GROWTH 5 DAYS Performed at [facility], [address] [place], [place] [address]    Report Status 07/06/2022 FINAL  Final  Urine Culture     Status: None   Collection Time: 07/01/22 11:54 PM   Specimen: Urine, Catheterized  Result Value Ref Range Status   Specimen Description   Final    URINE, CATHETERIZED Performed at [facility], [address] [place], [place] [address]    Special Requests   Final    Normal Performed at  [facility], [address], [place], [place] [address]    Culture   Final    NO GROWTH Performed at [facility], [address], [place], [place] [address]    Report Status 07/03/2022 FINAL  Final  MRSA Next Gen by PCR, Nasal     Status: None   Collection Time: 07/02/22  8:00 PM   Specimen: Nasal Mucosa; Nasal Swab  Result Value Ref Range Status   MRSA by PCR Next Gen NOT DETECTED NOT DETECTED Final    Comment: (NOTE) The GeneXpert MRSA Assay (FDA approved for NASAL specimens only), is one component of a comprehensive MRSA colonization surveillance program. It is not intended to diagnose MRSA infection nor to guide or monitor treatment for MRSA infections. Test performance is not FDA approved in patients less than 62 years old. Performed at [facility], [address], [place], [place] [address]       Radiology Studies: MR BRAIN WO CONTRAST  Result Date: 07/09/2022 CLINICAL DATA:  Vision loss, monocular EXAM: MRI HEAD WITHOUT CONTRAST TECHNIQUE: Multiplanar, multiecho pulse sequences of the brain and surrounding structures were obtained without intravenous contrast. COMPARISON:  CT head 07/02/2022. FINDINGS: Brain: No acute infarction, hemorrhage, hydrocephalus, extra-axial collection or mass lesion. Vascular: Major arterial flow voids are maintained the skull base. Skull and upper cervical spine: Normal marrow signal. Sinuses/Orbits: Negative. Other: No mastoid effusions. IMPRESSION: No evidence of acute intracranial abnormality. Electronically Signed   By: Feliberto Harts M.D.   On: 07/09/2022 14:05    Scheduled Meds:  amLODipine  10 mg Oral Daily   atorvastatin  40 mg Oral Daily   budesonide (PULMICORT) nebulizer solution  0.25 mg Nebulization BID   Chlorhexidine Gluconate Cloth  6 each Topical Daily   enoxaparin (LOVENOX) injection  40 mg Subcutaneous Q24H   hydrALAZINE  25 mg Oral Q8H   metoprolol succinate  100 mg Oral Daily   pantoprazole   40 mg Oral Daily   polyethylene glycol  17 g Oral Daily   sodium chloride flush  3 mL Intravenous Q12H   sodium chloride  1 g Oral BID WC   zolpidem  5 mg Oral Once   Continuous Infusions:   LOS: 1 day   Time spent: 40 minutes   Noela Brothers Estill Cotta, MD [facility]  If 7PM-7AM, please contact night-coverage [email or web address] 07/10/2022, 11:04 AM

## 2022-07-11 DIAGNOSIS — E871 Hypo-osmolality and hyponatremia: Secondary | ICD-10-CM | POA: Diagnosis not present

## 2022-07-11 LAB — BASIC METABOLIC PANEL WITH GFR
Anion gap: 8 (ref 5–15)
BUN: <5 mg/dL — ABNORMAL LOW (ref 8–23)
CO2: 22 mmol/L (ref 22–32)
Calcium: 8.7 mg/dL — ABNORMAL LOW (ref 8.9–10.3)
Chloride: 94 mmol/L — ABNORMAL LOW (ref 98–111)
Creatinine, Ser: 0.63 mg/dL (ref 0.44–1.00)
GFR, Estimated: >60 mL/min (ref >60)
Glucose, Bld: 112 mg/dL — ABNORMAL HIGH (ref 70–99)
Potassium: 4.1 mmol/L (ref 3.5–5.1)
Sodium: 124 mmol/L — ABNORMAL LOW (ref 135–145)

## 2022-07-11 LAB — SODIUM
Sodium: 124 mmol/L — ABNORMAL LOW (ref 135–145)
Sodium: 124 mmol/L — ABNORMAL LOW (ref 135–145)
Sodium: 124 mmol/L — ABNORMAL LOW (ref 135–145)
Sodium: 124 mmol/L — ABNORMAL LOW (ref 135–145)

## 2022-07-11 MED ORDER — SODIUM CHLORIDE 1 G PO TABS
1.0000 g | ORAL_TABLET | Freq: Three times a day (TID) | ORAL | Status: DC
  Administered 2022-07-12 – 2022-07-14 (×8): 1 g via ORAL
  Filled 2022-07-11 (×11): qty 1

## 2022-07-11 MED ORDER — ONDANSETRON HCL 4 MG/2ML IJ SOLN
4.0000 mg | Freq: Four times a day (QID) | INTRAMUSCULAR | Status: DC
  Administered 2022-07-11 – 2022-07-13 (×9): 4 mg via INTRAVENOUS
  Filled 2022-07-11 (×8): qty 2

## 2022-07-11 NOTE — Progress Notes (Signed)
PROGRESS NOTE  Katelyn [name]  DOB: 01/28/1961  PCP: Alease Medina, MD HER:[id number]  DOA: 07/06/2022  LOS: 2 days  Hospital Day: 6  Brief narrative: Katelyn [name] is a 62 y.o. female with PMH significant for HTN, HLD, diastolic CHF, chronic hyponatremia COPD, GERD, anxiety/depression, IBS, interstitial cystitis, migraine, chronic smoking who was recently hospitalized twice. 1/1-1/5 for acute encephalopathy, low sodium level at 112, urinary retention requiring Foley catheter and discharged home. 1/12 to 1/16 for nausea, vomiting or diarrhea leading to hypotension and severe electrolyte derangements.  Her sodium level was low at 104, potassium of 2.2.  He also had significant urinary retention due to Foley catheter malfunction.  She was in circulatory shock and required Levophed briefly.  After clinical and labs improvement, ultimately discharged home on 1/16 on salt tablets However within 24 hours of discharge, patient started complaining of abdominal pain, nausea vomiting again and was brought to the ED by EMS on 1/17  In the ED, patient was afebrile, heart rate in 80s, blood pressure 183/105, breathing on room air  Labs showed sodium level significantly low at 114 which is an acute drop from 131 from 24 hrs ago.  Potassium low at 3.4.  CBC unremarkable CT of the abdomen pelvis with contrast showed no acute abnormality.  Admitted to [facility]  Subjective: Patient was seen and examined this morning.  Lying on bed.  Continues to complain of nausea.  Only on clear liquid diet.  Sodium level continues to remain low.  Assessment and plan: Recurrent severe hyponatremia Patient seems to have chronic hyponatremia at least since 2014. In the last 3 weeks, patient has been hospitalized twice with severe hyponatremia.  In the most recent hospitalization, sodium level was significantly at 104.  Workup there showed hypoosmolar hyponatremia with urine osmolality around 200, urine sodium level of 71,  normal cortisol level, ACTH and TSH.  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 HCTZ was stopped. Patient was discharged on salt tablets and fluid restriction. Presented this time with significantly low sodium level at 114 which is an acute drop from 131 from 24 hours prior.  Sodium level improving already has trended below. Nephrology consult appreciated. Patient was started on salt tablets and fluid restriction despite which sodium level does not seem to improving.  Defer to nephrology. Recent Labs  Lab 07/09/22 0324 07/09/22 1145 07/09/22 1508 07/09/22 1926 07/09/22 2256 07/10/22 0306 07/10/22 0724 07/10/22 1747 07/10/22 2329 07/11/22 0609  NA 126* 126* 124* 126* 125* 124* 125* 122* 124* 124*    Abdominal pain History of IBS, GERD Patient continues to have abdominal pain that was present during recent admission.  Presumed viral gastroenteritis at that time as symptoms improved without significant intervention.   CT abdomen pelvis showed prominent stool in the colon.  No other acute abnormalities Continues to have nausea and poor oral tolerance.  Only on clear liquid diet. For the next 3 days, I will schedule her on IV Zofran every 6 hours.  QTc 1/19 - 451 seconds. Advance diet to soft today. Continue PPI.  Continue Bentyl as needed   Urinary retention During her hospitalization on 1/1, she was noted to have urinary retention and Foley catheter was inserted.  In subsequent admission, she was noted to have significant urine retention and hydronephrosis due to catheter malfunction.  Catheter was changed.   Repeat CT scan this admission showed improvement in retention with some residual right-sided hydronephrosis. Continue Foley catheter   Chronic diastolic CHF  Essential hypertension PTA on metoprolol succinate 100 mg daily, amlodipine 10 mg daily echo performed earlier this month so EF 70-75%, G1 DD, normal RV function. Continue metoprolol and  amlodipine.  Also on hydralazine 25 mg 3 times daily.  Continue to monitor.  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  Continue hydroxyzine as needed   Migraine Fioricet as needed   Mobility: Encourage ambulation  Goals of care   Code Status: DNR    DVT prophylaxis:  enoxaparin (LOVENOX) injection 40 mg Start: 07/06/22 1400   Antimicrobials: None Fluid: Fluid restriction Consultants: Nephrology Family Communication: None at bedside  Scheduled Meds:  amLODipine  10 mg Oral Daily   atorvastatin  40 mg Oral Daily   budesonide (PULMICORT) nebulizer solution  0.25 mg Nebulization BID   Chlorhexidine Gluconate Cloth  6 each Topical Daily   enoxaparin (LOVENOX) injection  40 mg Subcutaneous Q24H   hydrALAZINE  25 mg Oral Q8H   metoprolol succinate  100 mg Oral Daily   ondansetron (ZOFRAN) IV  4 mg Intravenous Q6H   pantoprazole  40 mg Oral Daily   polyethylene glycol  17 g Oral Daily   sodium chloride flush  3 mL Intravenous Q12H   sodium chloride  1 g Oral BID WC   zolpidem  5 mg Oral Once    PRN meds: acetaminophen **OR** acetaminophen, albuterol, butalbital-acetaminophen-caffeine, dicyclomine, hydrOXYzine, labetalol   Infusions:    Antimicrobials: Anti-infectives (From admission, onward)    None       Skin assessment:      Diet:  Diet Order             DIET SOFT Room service appropriate? Yes; Fluid consistency: Thin  Diet effective now                  Nutritional status:  Body mass index is 24.29 kg/m.           Status is: Observation Level of care: Telemetry Medical  Continue in-hospital care because: Continues to have low sodium level.  Dispo: The patient is from: Home              Anticipated d/c is to: Pending clinical              Patient currently is not medically stable  to d/c.   Difficult to place patient No   Objective: Vitals:   07/11/22 0304 07/11/22 0723  BP: (!) 151/83 (!) 178/81  Pulse: 66 70  Resp: 18 16  Temp: 98 F (36.7 C) 97.7 F (36.5 C)  SpO2: 100% 99%    Intake/Output Summary (Last 24 hours) at 07/11/2022 1128 Last data filed at 07/11/2022 0255 Gross per 24 hour  Intake --  Output 2550 ml  Net -2550 ml   Filed Weights   07/09/22 0500 07/10/22 0500 07/11/22 0304  Weight: 66 kg 71.9 kg 64.2 kg   Weight change: -7.7 kg Body mass index is 24.29 kg/m.   Physical Exam: General exam: Pleasant, middle-aged Caucasian female. Skin: No rashes, lesions or ulcers. HEENT: Atraumatic, normocephalic, no obvious bleeding Lungs: Clear to auscultation bilaterally CVS: Regular rate and rhythm, normal GI/Abd soft, nondistended, nontender, bowel sound present CNS: Alert, awake, oriented x 3 Psychiatry: Depressed look Extremities: No pedal, no calf tenderness  Data Review: I have personally reviewed the laboratory data and studies available.  F/u labs ordered Unresulted Labs (From admission, onward)     Start     Ordered   07/13/22 0500  Creatinine, serum  (enoxaparin (LOVENOX)    CrCl >/= 30 ml/min)  Weekly,   R     Comments: while on enoxaparin therapy    07/06/22 1300   07/10/22 1735  Sodium  Now then every 6 hours,   R      07/10/22 1148   07/08/22 1610  Basic metabolic panel  Daily,   R      07/07/22 1432            Total time spent in review of labs and imaging, patient evaluation, formulation of plan, documentation and communication with family:  32 minutes  Signed, Terrilee Croak, MD [facility] 07/11/2022

## 2022-07-11 NOTE — Progress Notes (Signed)
Patient ID: Katelyn [name], female   DOB: 08/20/1960, 62 y.o.   MRN: [id number] S: Still having N/V/abd pain x 1 week. Now on scheduled zofran x 3 days. O:BP (!) 178/81 (BP Location: Left Arm)   Pulse 70   Temp 97.7 F (36.5 C) (Oral)   Resp 16   Ht 5\' 4"  (1.626 m)   Wt 64.2 kg   SpO2 99%   BMI 24.29 kg/m   Intake/Output Summary (Last 24 hours) at 07/11/2022 1405 Last data filed at 07/11/2022 0255 Gross per 24 hour  Intake --  Output 1950 ml  Net -1950 ml   Intake/Output: I/O last 3 completed shifts: In: 240 [P.O.:240] Out: 3000 [Urine:3000]  Intake/Output this shift:  No intake/output data recorded. Weight change: -7.7 kg Gen: NAD CVS: RRR Resp:CTA Abd: +BS, soft, NT/ND Ext: no edema  Recent Labs  Lab 07/05/22 0239 07/06/22 0515 07/06/22 1718 07/06/22 2039 07/07/22 0509 07/07/22 1542 07/08/22 0351 07/09/22 0324 07/09/22 1145 07/09/22 2256 07/10/22 0306 07/10/22 0724 07/10/22 1747 07/10/22 2329 07/11/22 0609 07/11/22 1058  NA 131* 114*   < > 125* 127* 127* 129* 126*   < > 125* 124* 125* 122* 124* 124* 124*  K 3.6 3.4*   < > 3.6 3.5 3.7 3.7 3.3*  --   --  5.1  --   --   --  4.1  --   CL 99 80*   < > 93* 95* 96* 96* 93*  --   --  95*  --   --   --  94*  --   CO2 28 23   < > 21* 23 23 24 25   --   --  22  --   --   --  22  --   GLUCOSE 92 109*   < > 95 92 97 99 102*  --   --  105*  --   --   --  112*  --   BUN 6* <5*   < > <5* 5* <5* <5* <5*  --   --  <5*  --   --   --  <5*  --   CREATININE 0.61 0.42*   < > 0.64 0.65 0.52 0.47 0.57  --   --  0.72  --   --   --  0.63  --   ALBUMIN 2.6* 3.4*  --   --   --   --   --   --   --   --   --   --   --   --   --   --   CALCIUM 7.9* 8.7*   < > 8.2* 8.2* 8.0* 8.4* 8.7*  --   --  8.8*  --   --   --  8.7*  --   PHOS 3.3  --   --   --   --   --   --   --   --   --   --   --   --   --   --   --   AST  --  29  --   --   --   --   --   --   --   --   --   --   --   --   --   --   ALT  --  27  --   --   --   --   --   --   --   --    --   --   --   --   --   --    < > =  values in this interval not displayed.   Liver Function Tests: Recent Labs  Lab 07/05/22 0239 07/06/22 0515  AST  --  29  ALT  --  27  ALKPHOS  --  69  BILITOT  --  0.3  PROT  --  6.8  ALBUMIN 2.6* 3.4*   Recent Labs  Lab 07/06/22 0515  LIPASE 26   No results for input(s): "AMMONIA" in the last 168 hours. CBC: Recent Labs  Lab 07/05/22 0235 07/06/22 0515 07/07/22 0509 07/10/22 0306  WBC 5.4 10.0 5.4 5.9  NEUTROABS  --  7.8*  --   --   HGB 10.0* 12.7 11.5* 12.4  HCT 30.3* 35.6* 33.4* 36.0  MCV 91.0 85.6 87.2 87.8  PLT 262 312 300 315   Cardiac Enzymes: No results for input(s): "CKTOTAL", "CKMB", "CKMBINDEX", "TROPONINI" in the last 168 hours. CBG: No results for input(s): "GLUCAP" in the last 168 hours.  Iron Studies: No results for input(s): "IRON", "TIBC", "TRANSFERRIN", "FERRITIN" in the last 72 hours. Studies/Results: No results found.  amLODipine  10 mg Oral Daily   atorvastatin  40 mg Oral Daily   budesonide (PULMICORT) nebulizer solution  0.25 mg Nebulization BID   Chlorhexidine Gluconate Cloth  6 each Topical Daily   enoxaparin (LOVENOX) injection  40 mg Subcutaneous Q24H   hydrALAZINE  25 mg Oral Q8H   metoprolol succinate  100 mg Oral Daily   ondansetron (ZOFRAN) IV  4 mg Intravenous Q6H   pantoprazole  40 mg Oral Daily   polyethylene glycol  17 g Oral Daily   sodium chloride flush  3 mL Intravenous Q12H   sodium chloride  1 g Oral BID WC   zolpidem  5 mg Oral Once    BMET    Component Value Date/Time   NA 124 (L) 07/11/2022 1058   NA 132 (L) 10/09/2012 1515   K 4.1 07/11/2022 0609   K 4.1 10/09/2012 1515   CL 94 (L) 07/11/2022 0609   CL 98 10/09/2012 1515   CO2 22 07/11/2022 0609   CO2 30 10/09/2012 1515   GLUCOSE 112 (H) 07/11/2022 0609   GLUCOSE 84 10/09/2012 1515   BUN <5 (L) 07/11/2022 0609   BUN 6 (L) 10/09/2012 1515   CREATININE 0.63 07/11/2022 0609   CREATININE 0.64 10/09/2012 1515    CALCIUM 8.7 (L) 07/11/2022 0609   CALCIUM 8.9 10/09/2012 1515   GFRNONAA >60 07/11/2022 0609   GFRNONAA >60 10/09/2012 1515   GFRAA >60 02/04/2020 2322   GFRAA >60 10/09/2012 1515   CBC    Component Value Date/Time   WBC 5.9 07/10/2022 0306   RBC 4.10 07/10/2022 0306   HGB 12.4 07/10/2022 0306   HGB 14.2 10/09/2012 1515   HCT 36.0 07/10/2022 0306   HCT 41.6 10/09/2012 1515   PLT 315 07/10/2022 0306   PLT 189 10/09/2012 1515   MCV 87.8 07/10/2022 0306   MCV 92 10/09/2012 1515   MCH 30.2 07/10/2022 0306   MCHC 34.4 07/10/2022 0306   RDW 12.9 07/10/2022 0306   RDW 12.7 10/09/2012 1515   LYMPHSABS 1.4 07/06/2022 0515   LYMPHSABS 2.4 10/09/2012 1515   MONOABS 0.7 07/06/2022 0515   MONOABS 0.4 10/09/2012 1515   EOSABS 0.0 07/06/2022 0515   EOSABS 0.0 10/09/2012 1515   BASOSABS 0.0 07/06/2022 0515   BASOSABS 0.0 10/09/2012 1515    Assessment/Plan:   Hyponatremia - initially related to HCTZ and SSRI but those were stopped with significant drop in Na level  within 24 hours from 131 to 114.  Uosm 311, UNa 91, Sosm 265, urine specific gravity was low at 1.001.  Had normal cortisol level on previous admission.  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  CXR negative for acute pulmonary process but does have evidence of emphysema which could be source, she does report ongoing abdominal pain + N/V which is another possibility that can lead to SIADH.  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 Hopefully will improve especially with symptoms management. Replete K PRN as this will help with osmolality Will increase salt tabs to TID, c/w fluid restriction. Will repeat osm and urine studies tomorrow AM C/w serial Na checks Abdominal pain associated with N/V - CT scan without clear cause.  Plan per primary. Acute vision loss of left eye - MRI brain without acute process  Unclear etiology and don't believe  electrolyte abnormalities would explain this. Hypokalemia - started KCl 40 mEq bid and improved to 5.1.  Will D/c KCl. Urinary retention - Foley catheter in place will need Urology follow up. HTN - on amlodipine, hydralazine, and metoprolol.  HCTZ held due to hyponatremia. Anemia - stable.  Gean Quint, MD [facility]

## 2022-07-12 DIAGNOSIS — E871 Hypo-osmolality and hyponatremia: Secondary | ICD-10-CM | POA: Diagnosis not present

## 2022-07-12 LAB — BASIC METABOLIC PANEL
Anion gap: 7 (ref 5–15)
BUN: 6 mg/dL — ABNORMAL LOW (ref 8–23)
CO2: 24 mmol/L (ref 22–32)
Calcium: 8.7 mg/dL — ABNORMAL LOW (ref 8.9–10.3)
Chloride: 95 mmol/L — ABNORMAL LOW (ref 98–111)
Creatinine, Ser: 0.69 mg/dL (ref 0.44–1.00)
GFR, Estimated: >60 mL/min (ref >60)
Glucose, Bld: 100 mg/dL — ABNORMAL HIGH (ref 70–99)
Potassium: 4.2 mmol/L (ref 3.5–5.1)
Sodium: 126 mmol/L — ABNORMAL LOW (ref 135–145)

## 2022-07-12 LAB — SODIUM: Sodium: 127 mmol/L — ABNORMAL LOW (ref 135–145)

## 2022-07-12 LAB — OSMOLALITY, URINE: Osmolality, Ur: 212 mOsm/kg — ABNORMAL LOW (ref 300–900)

## 2022-07-12 LAB — SODIUM, URINE, RANDOM: Sodium, Ur: 22 mmol/L

## 2022-07-12 LAB — OSMOLALITY: Osmolality: 265 mOsm/kg — ABNORMAL LOW (ref 275–295)

## 2022-07-12 MED ORDER — BUDESONIDE 0.5 MG/2ML IN SUSP
RESPIRATORY_TRACT | Status: AC
  Filled 2022-07-12: qty 2

## 2022-07-12 NOTE — Progress Notes (Signed)
PROGRESS NOTE  Katelyn [name]  DOB: 09/26/1960  PCP: [name], MD DJS:[id number]  DOA: 07/06/2022  LOS: 3 days  Hospital Day: 7  Brief narrative: Katelyn [name] is a 62 y.o. female with PMH significant for HTN, HLD, diastolic CHF, chronic hyponatremia COPD, GERD, anxiety/depression, IBS, interstitial cystitis, migraine, chronic smoking who was recently hospitalized twice.  1/1-1/5 for acute encephalopathy, low sodium level at 112, urinary retention requiring Foley catheter and discharged home. 1/12 to 1/16 for nausea, vomiting or diarrhea leading to hypotension and severe electrolyte derangements.  Her sodium level was low at 104, potassium of 2.2.  He also had significant urinary retention due to Foley catheter malfunction.  She was in circulatory shock and required Levophed briefly.  After clinical and labs improvement, ultimately discharged home on 1/16 on salt tablets However within 24 hours of discharge, patient started complaining of abdominal pain, nausea vomiting again and was brought to the ED by EMS on 1/17  In the ED, patient was afebrile, heart rate in 80s, blood pressure 183/105, breathing on room air  Labs showed sodium level significantly low at 114 which is an acute drop from 131 from 24 hrs ago.  Potassium low at 3.4.  CBC unremarkable CT of the abdomen pelvis with contrast showed no acute abnormality.  Admitted to [facility]  Subjective: Patient was seen and examined this morning.  Lying on bed.  Complains of constant nausea but was able to hold down all of her breakfast this morning.  On scheduled IV Zofran.  Assessment and plan: Recurrent severe hyponatremia Patient seems to have chronic hyponatremia at least since 2014. In the last 3 weeks, patient has been hospitalized twice with severe hyponatremia.  In the most recent hospitalization, sodium level was significantly at 104.  Workup there showed hypoosmolar hyponatremia with urine osmolality around 200, urine  sodium level of 71, normal cortisol level, ACTH and TSH.  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 HCTZ was stopped. Patient was discharged on salt tablets and fluid restriction. Presented this time with significantly low sodium level at 114 which is an acute drop from 131 from 24 hours prior.  Sodium level improving already has trended below. Nephrology consult appreciated. Patient was started on salt tablets and fluid restriction.  Sodium level is only gradually improving. Recent Labs  Lab 07/10/22 0306 07/10/22 0724 07/10/22 1747 07/10/22 2329 07/11/22 0609 07/11/22 1058 07/11/22 1750 07/11/22 2253 07/12/22 0604 07/12/22 1121  NA 124* 125* 122* 124* 124* 124* 124* 124* 126* 127*     Abdominal pain History of IBS, GERD Patient continues to have abdominal pain that was present during recent admission.  Presumed viral gastroenteritis at that time as symptoms improved without significant intervention.   CT abdomen pelvis showed prominent stool in the colon.  No other acute abnormalities Currently on scheduled IV Zofran.  Able to tolerate soft diet.  I will upgrade her to regular diet today. Continue PPI.  Continue Bentyl as needed   Urinary retention During her hospitalization on 1/1, she was noted to have urinary retention and Foley catheter was inserted.  In subsequent admission, she was noted to have significant urine retention and hydronephrosis due to catheter malfunction.  Catheter was changed.   Repeat CT scan this admission showed improvement in retention with some residual right-sided hydronephrosis. Continue Foley catheter   Chronic diastolic CHF Essential hypertension PTA on metoprolol succinate 100 mg daily, amlodipine 10 mg daily echo performed earlier this month so EF 70-75%,  G1 DD, normal RV function. Continue metoprolol and amlodipine.  Also on hydralazine 25 mg 3 times daily.  Continue to monitor.  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  Continue hydroxyzine as needed   Migraine Fioricet as needed   Mobility: Encourage ambulation  Goals of care   Code Status: DNR    DVT prophylaxis:  enoxaparin (LOVENOX) injection 40 mg Start: 07/06/22 1400   Antimicrobials: None Fluid: Fluid restriction Consultants: Nephrology Family Communication: None at bedside  Scheduled Meds:  amLODipine  10 mg Oral Daily   atorvastatin  40 mg Oral Daily   budesonide       budesonide (PULMICORT) nebulizer solution  0.25 mg Nebulization BID   Chlorhexidine Gluconate Cloth  6 each Topical Daily   enoxaparin (LOVENOX) injection  40 mg Subcutaneous Q24H   hydrALAZINE  25 mg Oral Q8H   metoprolol succinate  100 mg Oral Daily   ondansetron (ZOFRAN) IV  4 mg Intravenous Q6H   pantoprazole  40 mg Oral Daily   polyethylene glycol  17 g Oral Daily   sodium chloride flush  3 mL Intravenous Q12H   sodium chloride  1 g Oral TID WC   zolpidem  5 mg Oral Once    PRN meds: acetaminophen **OR** acetaminophen, albuterol, budesonide, butalbital-acetaminophen-caffeine, dicyclomine, hydrOXYzine, labetalol   Infusions:    Antimicrobials: Anti-infectives (From admission, onward)    None       Skin assessment:      Diet:  Diet Order             DIET SOFT Room service appropriate? Yes; Fluid consistency: Thin  Diet effective now                  Nutritional status:  Body mass index is 25.35 kg/m.           Status is: Observation Level of care: Telemetry Medical  Continue in-hospital care because: Continues to have low sodium level.  Dispo: The patient is from: Home              Anticipated d/c is to: Pending clinical              Patient currently is not medically stable to d/c.   Difficult to place patient No   Objective: Vitals:    07/12/22 0521 07/12/22 0750  BP: 115/74 113/63  Pulse: 67 73  Resp: 17 17  Temp: 98.5 F (36.9 C) 98.3 F (36.8 C)  SpO2: 97% 96%    Intake/Output Summary (Last 24 hours) at 07/12/2022 1328 Last data filed at 07/12/2022 0524 Gross per 24 hour  Intake 480 ml  Output 600 ml  Net -120 ml    Filed Weights   07/11/22 0304 07/11/22 2219 07/12/22 0500  Weight: 64.2 kg 66.2 kg 67 kg   Weight change: 2 kg Body mass index is 25.35 kg/m.   Physical Exam: General exam: Pleasant, middle-aged Caucasian female. Skin: No rashes, lesions or ulcers. HEENT: Atraumatic, normocephalic, no obvious bleeding Lungs: Clear to auscultation bilaterally CVS: Regular rate and rhythm, normal GI/Abd soft, nondistended, nontender, bowel sound present CNS: Alert, awake, oriented x 3 Psychiatry: Depressed look Extremities: No pedal, no calf tenderness  Data Review: I have personally reviewed the laboratory data and studies available.  F/u labs ordered Unresulted Labs (From admission, onward)  Start     Ordered   07/13/22 0500  Creatinine, serum  (enoxaparin (LOVENOX)    CrCl >/= 30 ml/min)  Weekly,   R     Comments: while on enoxaparin therapy    07/06/22 1300   07/12/22 0500  Osmolality  Tomorrow morning,   R        07/11/22 1407   07/12/22 0500  Osmolality, urine  Tomorrow morning,   R        07/11/22 1407   07/10/22 1735  Sodium  Now then every 6 hours,   R (with TIMED occurrences)      07/10/22 1148            Total time spent in review of labs and imaging, patient evaluation, formulation of plan, documentation and communication with family:  45 minutes  Signed, Terrilee Croak, MD [facility] 07/12/2022

## 2022-07-12 NOTE — Evaluation (Signed)
Physical Therapy Evaluation Patient Details Name: Katelyn [name] MRN: [id number] DOB: 1960-12-19 Today's Date: 07/12/2022  History of Present Illness  62 y.o. female re-admitted 1/17 for Recurrent severe hyponatremia.  PMH significant for HTN, HLD, diastolic CHF, chronic hyponatremia COPD, GERD, anxiety/depression, IBS, interstitial cystitis, migraine, chronic smoking who was recently hospitalized twice. 1/1-1/5 for acute encephalopathy, low sodium level at 112, urinary retention requiring Foley catheter and discharged home.  1/12 to 1/16 for nausea, vomiting or diarrhea leading to hypotension and severe electrolyte derangements.  Clinical Impression  Pt admitted with above diagnosis. Actually mobilizing a bit better when compared to recent admission and d/c. Supervision for safety, declines use of RW for support at this time. Educated on safety and awareness. Agreeable to HHPT for home safety follow-up and progression of strength and balance. States she's had one fall in the past week. Was d/c home with RW previously.  Pt complains primarily of back pain which she states is chronic. Encouraged OOB frequently during admission. Pt currently with functional limitations due to the deficits listed below (see PT Problem List). Pt will benefit from skilled PT to increase their independence and safety with mobility to allow discharge to the venue listed below.          Recommendations for follow up therapy are one component of a multi-disciplinary discharge planning process, led by the attending physician.  Recommendations may be updated based on patient status, additional functional criteria and insurance authorization.  Follow Up Recommendations Home health PT      Assistance Recommended at Discharge Intermittent Supervision/Assistance  Patient can return home with the following  A little help with walking and/or transfers;Assistance with cooking/housework;Assist for transportation;Help with stairs or  ramp for entrance;A little help with bathing/dressing/bathroom;Direct supervision/assist for medications management;Direct supervision/assist for financial management    Equipment Recommendations None recommended by PT  Recommendations for Other Services       Functional Status Assessment Patient has had a recent decline in their functional status and demonstrates the ability to make significant improvements in function in a reasonable and predictable amount of time.     Precautions / Restrictions Precautions Precautions: Fall Precaution Comments: watch HR Restrictions Weight Bearing Restrictions: No      Mobility  Bed Mobility               General bed mobility comments: Standing with NT when PT entered room    Transfers Overall transfer level: Needs assistance Equipment used: None Transfers: Sit to/from Stand Sit to Stand: Supervision           General transfer comment: Supervision for safety with transition from standing to sitting in recliner. Good control demonstrated. Able to readjust self in chair.    Ambulation/Gait Ambulation/Gait assistance: Supervision Gait Distance (Feet): 85 Feet Assistive device: None Gait Pattern/deviations: Step-through pattern, Decreased stride length Gait velocity: decreased Gait velocity interpretation: <1.8 ft/sec, indicate of risk for recurrent falls   General Gait Details: Very mild instability with gait, declines using RW at this time. Pt tolerated increased distance compared to recent admission assessment. Complains of LBP (chronic baseline) requesting to sit. No overt LOB however likely would feel more stable and confident with use of SPC or RW and discussed this with pt. Agreeable to use RW at home.  Stairs            Wheelchair Mobility    Modified Rankin (Stroke Patients Only)       Balance Overall balance assessment: Mild deficits observed, not formally  tested Sitting-balance support: Feet  supported Sitting balance-Leahy Scale: Good     Standing balance support: No upper extremity supported, During functional activity Standing balance-Leahy Scale: Fair                               Pertinent Vitals/Pain Pain Assessment Pain Assessment: Faces Faces Pain Scale: Hurts little more Pain Location: back Pain Descriptors / Indicators: Aching, Discomfort Pain Intervention(s): Monitored during session, Repositioned    Home Living Family/patient expects to be discharged to:: Private residence Living Arrangements: Children (son) Available Help at Discharge: Family (pt reports son does not work) Type of Home: House Home Access: Stairs to enter Entrance Stairs-Rails: None Entrance Stairs-Number of Steps: 4-5 at side and front entrance, rails on side entrance only (5 steps in garage with left sided rail)   Home Layout: One level Home Equipment: Conservation officer, nature (2 wheels)      Prior Function Prior Level of Function : Independent/Modified Independent;History of Falls (last six months)             Mobility Comments: Independent with RW since last d/c. Reports 1 fall       Hand Dominance   Dominant Hand: Right    Extremity/Trunk Assessment   Upper Extremity Assessment Upper Extremity Assessment: Defer to OT evaluation    Lower Extremity Assessment Lower Extremity Assessment: Generalized weakness (left hip flexion 4/5)    Cervical / Trunk Assessment Cervical / Trunk Assessment: Normal  Communication   Communication: No difficulties  Cognition Arousal/Alertness: Awake/alert Behavior During Therapy: WFL for tasks assessed/performed Overall Cognitive Status: Within Functional Limits for tasks assessed                                          General Comments      Exercises General Exercises - Lower Extremity Ankle Circles/Pumps: AROM, Both, 10 reps, Seated Quad Sets: Strengthening, Both, 10 reps, Seated Gluteal Sets:  Strengthening, Both, 10 reps, Seated   Assessment/Plan    PT Assessment Patient needs continued PT services  PT Problem List Decreased strength;Decreased balance;Cardiopulmonary status limiting activity;Decreased mobility;Decreased activity tolerance;Pain       PT Treatment Interventions DME instruction;Functional mobility training;Balance training;Patient/family education;Gait training;Therapeutic activities;Stair training;Therapeutic exercise;Neuromuscular re-education    PT Goals (Current goals can be found in the Care Plan section)  Acute Rehab PT Goals Patient Stated Goal: to get better PT Goal Formulation: With patient Time For Goal Achievement: 07/26/22 Potential to Achieve Goals: Good    Frequency Min 3X/week     Co-evaluation               AM-PAC PT "6 Clicks" Mobility  Outcome Measure Help needed turning from your back to your side while in a flat bed without using bedrails?: A Little Help needed moving from lying on your back to sitting on the side of a flat bed without using bedrails?: A Little Help needed moving to and from a bed to a chair (including a wheelchair)?: A Little Help needed standing up from a chair using your arms (e.g., wheelchair or bedside chair)?: A Little Help needed to walk in hospital room?: A Little Help needed climbing 3-5 steps with a railing? : A Lot 6 Click Score: 17    End of Session Equipment Utilized During Treatment: Gait belt Activity Tolerance: Patient tolerated treatment well Patient  left: with call bell/phone within reach;in chair;with chair alarm set Nurse Communication: Mobility status PT Visit Diagnosis: Other abnormalities of gait and mobility (R26.89);Muscle weakness (generalized) (M62.81);History of falling (Z91.81);Difficulty in walking, not elsewhere classified (R26.2);Pain Pain - part of body:  (back)    Time: 9381-8299 PT Time Calculation (min) (ACUTE ONLY): 13 min   Charges:   PT Evaluation $PT Eval Low  Complexity: 1 Low          Kathlyn Sacramento, PT, DPT Physical Therapist Acute Rehabilitation Services [facility] Outpatient Rehabilitation Services [facility]   Berton Mount 07/12/2022, 2:27 PM

## 2022-07-12 NOTE — TOC Initial Note (Addendum)
Transition of Care (TOC) - Initial/Assessment Note   Spoke with patient at bedside.   Per notes patient was set up with [facility] for HHPT/OT on 06/24/22. NCM called [name] with [name], per [name] notes , [name] called patient 4 times and left 2 voice mails ( last 06/30/22) and did not hear back from patient , so they were unable to see patient.   [name] can accept referral for HHPT if , PT sees patient while she is in the hospital , recommends HHPT and MD orders, and they can reach patient.  Secure chatted MD and bedside nurse.    Discussed with patient at bedside. Confirmed her number is [phone] . Patient states she has not received any calls or voice mails from [name]. Patient lives with son. NCM asked if there was another number to give to [facility] , patient replied no . NCM asked about her son. Patient stated the only number is [phone].   Patient has walker at home.   Received orders for HHPT. [name] with [name] aware , they will try to reach patient again  Patient Details  Name: Katelyn [name] MRN: [id number] Date of Birth: 1961-01-31  Transition [name]) CM/SW Contact:    [name], RN Phone Number: 07/12/2022, 1:19 PM  Clinical Narrative:                   Expected Discharge Plan: [place]     Patient Goals and CMS Choice Patient states their goals for this hospitalization and ongoing recovery are:: to return to home CMS Medicare.gov Compare Post Acute Care list provided to:: Patient Choice offered to / list presented to : Patient      Expected Discharge Plan and Services   Discharge Planning Services: CM Consult Post Acute Care Choice: [place] arrangements for the past 2 months: Single Family Home                   DME Agency: NA                  Prior Living Arrangements/Services Living arrangements for the past 2 months: Single Family Home Lives with:: Adult Children Patient language and need for interpreter  reviewed:: Yes Do you feel safe going back to the place where you live?: Yes      Need for Family Participation in Patient Care: Yes (Comment) Care giver support system in place?: Yes (comment) Current home services: DME Criminal Activity/Legal Involvement Pertinent to Current Situation/Hospitalization: No - Comment as needed  Activities of Daily Living      Permission Sought/Granted   Permission granted to share information with : No              Emotional Assessment Appearance:: Appears stated age Attitude/Demeanor/Rapport: Engaged Affect (typically observed): Accepting Orientation: : Oriented to Self, Oriented to Place, Oriented to  Time, Oriented to Situation Alcohol / Substance Use: Not Applicable Psych Involvement: No (comment)  Admission diagnosis:  Hyponatremia [E87.1] Patient Active Problem List   Diagnosis Date Noted   Acute gastroenteritis 07/02/2022   Anxiety and depression 07/02/2022   GERD without esophagitis 07/02/2022   Nausea vomiting and diarrhea 07/02/2022   Hyponatremia 07/01/2022   Acute encephalopathy 06/21/2022   Hypokalemia 06/21/2022   COPD (chronic obstructive pulmonary disease) ([place]) 06/21/2022   Chronic diastolic CHF (congestive heart failure) ([place]) 06/21/2022   Urinary incontinence 06/21/2022   Tobacco abuse 06/21/2022   Dysphagia 05/26/2020  Colon cancer screening 05/26/2020   Abdominal bloating 05/26/2020   Acute urinary retention 07/17/2017   Chest pain 01/27/2014   Essential hypertension 01/27/2014   Hyperlipidemia 01/27/2014   PCP:  [name], MD Pharmacy:   [id number] [place], [place] [address] [place] [address] Phone: [phone] Fax: [phone]     Social Determinants of Health (SDOH) Social History: SDOH Screenings   Food Insecurity: No Food Insecurity (06/22/2022)  Housing: Low Risk  (06/22/2022)  Transportation Needs: No Transportation Needs (06/22/2022)  Utilities:  Not At Risk (06/22/2022)  Tobacco Use: High Risk (07/06/2022)   SDOH Interventions:     Readmission Risk Interventions     No data to display

## 2022-07-12 NOTE — Progress Notes (Signed)
Patient ID: Katelyn [name], female   DOB: 1960-11-15, 62 y.o.   MRN: [id number] S: patient seen and examined bedside. No acute events. She reports that she still feels sick to her stomach despite zofran. O:BP 113/63 (BP Location: Right Arm)   Pulse 73   Temp 98.3 F (36.8 C) (Oral)   Resp 17   Ht 5\' 4"  (1.626 m)   Wt 67 kg   SpO2 96%   BMI 25.35 kg/m   Intake/Output Summary (Last 24 hours) at 07/12/2022 1435 Last data filed at 07/12/2022 0524 Gross per 24 hour  Intake 480 ml  Output 600 ml  Net -120 ml   Intake/Output: I/O last 3 completed shifts: In: 480 [P.O.:480] Out: 1550 [Urine:1550]  Intake/Output this shift:  No intake/output data recorded. Weight change: 2 kg Gen: NAD CVS: RRR Resp:CTA Abd: +BS, soft, NT/ND Ext: no edema  Recent Labs  Lab 07/06/22 0515 07/06/22 1718 07/07/22 0509 07/07/22 1542 07/08/22 0351 07/09/22 0324 07/09/22 1145 07/10/22 0306 07/10/22 0724 07/10/22 2329 07/11/22 0609 07/11/22 1058 07/11/22 1750 07/11/22 2253 07/12/22 0604 07/12/22 1121  NA 114*   < > 127* 127* 129* 126*   < > 124*   < > 124* 124* 124* 124* 124* 126* 127*  K 3.4*   < > 3.5 3.7 3.7 3.3*  --  5.1  --   --  4.1  --   --   --  4.2  --   CL 80*   < > 95* 96* 96* 93*  --  95*  --   --  94*  --   --   --  95*  --   CO2 23   < > 23 23 24 25   --  22  --   --  22  --   --   --  24  --   GLUCOSE 109*   < > 92 97 99 102*  --  105*  --   --  112*  --   --   --  100*  --   BUN <5*   < > 5* <5* <5* <5*  --  <5*  --   --  <5*  --   --   --  6*  --   CREATININE 0.42*   < > 0.65 0.52 0.47 0.57  --  0.72  --   --  0.63  --   --   --  0.69  --   ALBUMIN 3.4*  --   --   --   --   --   --   --   --   --   --   --   --   --   --   --   CALCIUM 8.7*   < > 8.2* 8.0* 8.4* 8.7*  --  8.8*  --   --  8.7*  --   --   --  8.7*  --   AST 29  --   --   --   --   --   --   --   --   --   --   --   --   --   --   --   ALT 27  --   --   --   --   --   --   --   --   --   --   --   --   --   --   --     < > =  values in this interval not displayed.   Liver Function Tests: Recent Labs  Lab 07/06/22 0515  AST 29  ALT 27  ALKPHOS 69  BILITOT 0.3  PROT 6.8  ALBUMIN 3.4*   Recent Labs  Lab 07/06/22 0515  LIPASE 26   No results for input(s): "AMMONIA" in the last 168 hours. CBC: Recent Labs  Lab 07/06/22 0515 07/07/22 0509 07/10/22 0306  WBC 10.0 5.4 5.9  NEUTROABS 7.8*  --   --   HGB 12.7 11.5* 12.4  HCT 35.6* 33.4* 36.0  MCV 85.6 87.2 87.8  PLT 312 300 315   Cardiac Enzymes: No results for input(s): "CKTOTAL", "CKMB", "CKMBINDEX", "TROPONINI" in the last 168 hours. CBG: No results for input(s): "GLUCAP" in the last 168 hours.  Iron Studies: No results for input(s): "IRON", "TIBC", "TRANSFERRIN", "FERRITIN" in the last 72 hours. Studies/Results: No results found.  amLODipine  10 mg Oral Daily   atorvastatin  40 mg Oral Daily   budesonide       budesonide (PULMICORT) nebulizer solution  0.25 mg Nebulization BID   Chlorhexidine Gluconate Cloth  6 each Topical Daily   enoxaparin (LOVENOX) injection  40 mg Subcutaneous Q24H   hydrALAZINE  25 mg Oral Q8H   metoprolol succinate  100 mg Oral Daily   ondansetron (ZOFRAN) IV  4 mg Intravenous Q6H   pantoprazole  40 mg Oral Daily   polyethylene glycol  17 g Oral Daily   sodium chloride flush  3 mL Intravenous Q12H   sodium chloride  1 g Oral TID WC   zolpidem  5 mg Oral Once    BMET    Component Value Date/Time   NA 127 (L) 07/12/2022 1121   NA 132 (L) 10/09/2012 1515   K 4.2 07/12/2022 0604   K 4.1 10/09/2012 1515   CL 95 (L) 07/12/2022 0604   CL 98 10/09/2012 1515   CO2 24 07/12/2022 0604   CO2 30 10/09/2012 1515   GLUCOSE 100 (H) 07/12/2022 0604   GLUCOSE 84 10/09/2012 1515   BUN 6 (L) 07/12/2022 0604   BUN 6 (L) 10/09/2012 1515   CREATININE 0.69 07/12/2022 0604   CREATININE 0.64 10/09/2012 1515   CALCIUM 8.7 (L) 07/12/2022 0604   CALCIUM 8.9 10/09/2012 1515   GFRNONAA >60 07/12/2022 0604   GFRNONAA  >60 10/09/2012 1515   GFRAA >60 02/04/2020 2322   GFRAA >60 10/09/2012 1515   CBC    Component Value Date/Time   WBC 5.9 07/10/2022 0306   RBC 4.10 07/10/2022 0306   HGB 12.4 07/10/2022 0306   HGB 14.2 10/09/2012 1515   HCT 36.0 07/10/2022 0306   HCT 41.6 10/09/2012 1515   PLT 315 07/10/2022 0306   PLT 189 10/09/2012 1515   MCV 87.8 07/10/2022 0306   MCV 92 10/09/2012 1515   MCH 30.2 07/10/2022 0306   MCHC 34.4 07/10/2022 0306   RDW 12.9 07/10/2022 0306   RDW 12.7 10/09/2012 1515   LYMPHSABS 1.4 07/06/2022 0515   LYMPHSABS 2.4 10/09/2012 1515   MONOABS 0.7 07/06/2022 0515   MONOABS 0.4 10/09/2012 1515   EOSABS 0.0 07/06/2022 0515   EOSABS 0.0 10/09/2012 1515   BASOSABS 0.0 07/06/2022 0515   BASOSABS 0.0 10/09/2012 1515    Assessment/Plan:   Hyponatremia - initially related to HCTZ and SSRI but those were stopped with significant drop in Na level within 24 hours from 131 to 114.  Uosm 311, UNa 91, Sosm 265, urine specific gravity was low at 1.001.  Had normal cortisol level on previous admission.  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  CXR negative for acute pulmonary process but does have evidence of emphysema which could be source, she does report ongoing abdominal pain + N/V which is another possibility that can lead to SIADH.  Na up to 127. 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 Hopefully will improve especially with symptoms management. Replete K PRN as this will help with osmolality On salt tabs 1g TID, will c/w this Can stop serial Na checks given improvement. Will d/c and order labs for tomorrow Will add fluid restriction 1.5L/day, discussed in detail with patient today, orders placed Abdominal pain associated with N/V - CT scan without clear cause.  Plan per primary. Acute vision loss of left eye - MRI brain without acute process  Unclear etiology and don't  believe electrolyte abnormalities would explain this. Hypokalemia - started KCl 40 mEq bid and improved to 5.1.  Kcl had been d/c'ed now. K WNL Urinary retention - Foley catheter in place will need Urology follow up. HTN - on amlodipine, hydralazine, and metoprolol.  HCTZ held due to hyponatremia. Anemia - stable.  Anthony Sar, MD [facility]

## 2022-07-13 DIAGNOSIS — E871 Hypo-osmolality and hyponatremia: Secondary | ICD-10-CM | POA: Diagnosis not present

## 2022-07-13 LAB — COMPREHENSIVE METABOLIC PANEL
ALT: 19 U/L (ref 0–44)
AST: 20 U/L (ref 15–41)
Albumin: 2.7 g/dL — ABNORMAL LOW (ref 3.5–5.0)
Alkaline Phosphatase: 53 U/L (ref 38–126)
Anion gap: 7 (ref 5–15)
BUN: 9 mg/dL (ref 8–23)
CO2: 23 mmol/L (ref 22–32)
Calcium: 8.4 mg/dL — ABNORMAL LOW (ref 8.9–10.3)
Chloride: 99 mmol/L (ref 98–111)
Creatinine, Ser: 0.81 mg/dL (ref 0.44–1.00)
GFR, Estimated: >60 mL/min (ref >60)
Glucose, Bld: 104 mg/dL — ABNORMAL HIGH (ref 70–99)
Potassium: 4.1 mmol/L (ref 3.5–5.1)
Sodium: 129 mmol/L — ABNORMAL LOW (ref 135–145)
Total Bilirubin: 0.2 mg/dL — ABNORMAL LOW (ref 0.3–1.2)
Total Protein: 5.3 g/dL — ABNORMAL LOW (ref 6.5–8.1)

## 2022-07-13 LAB — CBC
HCT: 33.2 % — ABNORMAL LOW (ref 36.0–46.0)
Hemoglobin: 11.1 g/dL — ABNORMAL LOW (ref 12.0–15.0)
MCH: 30.2 pg (ref 26.0–34.0)
MCHC: 33.4 g/dL (ref 30.0–36.0)
MCV: 90.5 fL (ref 80.0–100.0)
Platelets: 262 10*3/uL (ref 150–400)
RBC: 3.67 MIL/uL — ABNORMAL LOW (ref 3.87–5.11)
RDW: 13.2 % (ref 11.5–15.5)
WBC: 6.4 10*3/uL (ref 4.0–10.5)
nRBC: 0 % (ref 0.0–0.2)

## 2022-07-13 MED ORDER — BUDESONIDE 0.5 MG/2ML IN SUSP
RESPIRATORY_TRACT | Status: AC
  Filled 2022-07-13: qty 2

## 2022-07-13 MED ORDER — ONDANSETRON HCL 4 MG/2ML IJ SOLN
4.0000 mg | Freq: Four times a day (QID) | INTRAMUSCULAR | Status: DC | PRN
  Administered 2022-07-14: 4 mg via INTRAVENOUS
  Filled 2022-07-13: qty 2

## 2022-07-13 MED ORDER — PROMETHAZINE HCL 25 MG PO TABS
12.5000 mg | ORAL_TABLET | Freq: Three times a day (TID) | ORAL | Status: DC
  Administered 2022-07-13 – 2022-07-14 (×3): 12.5 mg via ORAL
  Filled 2022-07-13 (×3): qty 1

## 2022-07-13 MED ORDER — FLUCONAZOLE 150 MG PO TABS
150.0000 mg | ORAL_TABLET | Freq: Once | ORAL | Status: AC
  Administered 2022-07-13: 150 mg via ORAL
  Filled 2022-07-13: qty 1

## 2022-07-13 MED ORDER — CLOTRIMAZOLE 1 % EX CREA
TOPICAL_CREAM | Freq: Two times a day (BID) | CUTANEOUS | Status: AC
  Filled 2022-07-13: qty 15

## 2022-07-13 NOTE — Progress Notes (Signed)
Mobility Specialist - Progress Note   07/13/22 1000  Mobility  Activity Ambulated with assistance in hallway  Level of Assistance Standby assist, set-up cues, supervision of patient - no hands on  Assistive Device None  Distance Ambulated (ft) 80 ft  Activity Response Tolerated well  Mobility Referral Yes  $Mobility charge 1 Mobility    Pt received in bed agreeable to mobility. Distance limited by c/o stomach pain and fatigue. Left in bed w/ call bell in reach and all needs met.   [place] Specialist Please contact via SecureChat or Rehab office at [phone]

## 2022-07-13 NOTE — Progress Notes (Signed)
Patient is requesting vaginal cream for itching.  Chronic foley in place.  Messaged on call MD.

## 2022-07-13 NOTE — TOC Progression Note (Signed)
Transition of Care [facility]) - Progression Note    Patient Details  Name: Katelyn [name] MRN: [id number] Date of Birth: 1960-12-18  Transition of Care [facility]) CM/SW [place], [place] Phone Number: 07/13/2022, 12:04 PM  Clinical Narrative:    CSW spoke with Georgina Snell at [facility] who confirmed they are following pt and will follow up with her to start services when she discharges from the hospital. [facility] will be available for any further needs.    Expected Discharge Plan: [place]    Expected Discharge Plan and Services   Discharge Planning Services: CM Consult Post Acute Care Choice: [place] arrangements for the past 2 months: Single Family Home                   DME Agency: NA                   Social Determinants of Health (SDOH) Interventions SDOH Screenings   Food Insecurity: No Food Insecurity (06/22/2022)  Housing: Low Risk  (06/22/2022)  Transportation Needs: No Transportation Needs (06/22/2022)  Utilities: Not At Risk (06/22/2022)  Tobacco Use: High Risk (07/06/2022)    Readmission Risk Interventions    07/12/2022    1:24 PM  Readmission Risk Prevention Plan  Transportation Screening Complete  Medication Review ([place]) Complete  PCP or Specialist appointment within 3-5 days of discharge Complete  HRI or [place] Complete  SW Recovery Care/Counseling Consult Complete  [place] Not Applicable

## 2022-07-13 NOTE — Progress Notes (Addendum)
PROGRESS NOTE  Katelyn [name]  DOB: 1961/01/12  PCP: [name], MD YBO:[id number]  DOA: 07/06/2022  LOS: 4 days  Hospital Day: 8  Brief narrative: Katelyn [name] is a 62 y.o. female with PMH significant for HTN, HLD, diastolic CHF, chronic hyponatremia COPD, GERD, anxiety/depression, IBS, interstitial cystitis, migraine, chronic smoking who was recently hospitalized twice.  1/1-1/5 for acute encephalopathy, low sodium level at 112, urinary retention requiring Foley catheter and discharged home. 1/12 to 1/16 for nausea, vomiting or diarrhea leading to hypotension and severe electrolyte derangements.  Her sodium level was low at 104, potassium of 2.2.  He also had significant urinary retention due to Foley catheter malfunction.  She was in circulatory shock and required Levophed briefly.  After clinical and labs improvement, ultimately discharged home on 1/16 on salt tablets However within 24 hours of discharge, patient started complaining of abdominal pain, nausea vomiting again and was brought to the ED by EMS on 1/17  In the ED, patient was afebrile, heart rate in 80s, blood pressure 183/105, breathing on room air  Labs showed sodium level significantly low at 114 which is an acute drop from 131 from 24 hrs ago.  Potassium low at 3.4.  CBC unremarkable CT of the abdomen pelvis with contrast showed no acute abnormality.  Admitted to [facility]  Subjective: Patient was seen and examined this morning.  Lying down in bed.  Complains of persistent nausea but she has been able to hold down food without vomiting for last 3 days. Sodium level improving.  Assessment and plan: Recurrent severe hyponatremia Patient seems to have chronic hyponatremia at least since 2014. In the last 3 weeks, patient has been hospitalized twice with severe hyponatremia.  In the most recent hospitalization, sodium level was significantly at 104.  Workup there showed hypoosmolar hyponatremia with urine osmolality  around 200, urine sodium level of 71, normal cortisol level, ACTH and TSH.  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 HCTZ was stopped. Patient was discharged on salt tablets and fluid restriction. Presented this time with significantly low sodium level at 114 which is an acute drop from 131 from 24 hours prior.  Sodium level improving already has trended below. Nephrology consult appreciated. Patient was started on salt tablets and fluid restriction.  Sodium level is only gradually improving.  Continue to monitor. Recent Labs  Lab 07/10/22 0724 07/10/22 1747 07/10/22 2329 07/11/22 0609 07/11/22 1058 07/11/22 1750 07/11/22 2253 07/12/22 0604 07/12/22 1121 07/13/22 0554  NA 125* 122* 124* 124* 124* 124* 124* 126* 127* 129*   Abdominal pain History of IBS, GERD Patient continues to have abdominal pain that was present during recent admission.  Presumed viral gastroenteritis at that time as symptoms improved without significant intervention.   CT abdomen pelvis showed prominent stool in the colon.  No other acute abnormalities Currently on scheduled IV Zofran.  Still complains of nausea but able to tolerate food without throwing up.  Switch her to scheduled Phenergan Premeal today. Continue PPI. Continue Bentyl as needed   Urinary retention During her hospitalization on 1/1, she was noted to have urinary retention and Foley catheter was inserted.  In subsequent admission, she was noted to have significant urine retention and hydronephrosis due to catheter malfunction.  Catheter was changed.   Repeat CT scan this admission showed improvement in retention with some residual right-sided hydronephrosis. Continue Foley catheter   Chronic diastolic CHF Essential hypertension PTA on metoprolol succinate 100 mg daily, amlodipine 10 mg  daily echo performed earlier this month so EF 70-75%, G1 DD, normal RV function. Continue metoprolol and amlodipine.  Also on  hydralazine 25 mg 3 times daily.  Continue to monitor.  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  Continue hydroxyzine as needed   Migraine Fioricet as needed   Mobility: Encourage ambulation  Goals of care   Code Status: DNR    DVT prophylaxis:  enoxaparin (LOVENOX) injection 40 mg Start: 07/06/22 1400   Antimicrobials: None Fluid: Fluid restriction Consultants: Nephrology Family Communication: None at bedside  Scheduled Meds:  amLODipine  10 mg Oral Daily   atorvastatin  40 mg Oral Daily   budesonide (PULMICORT) nebulizer solution  0.25 mg Nebulization BID   Chlorhexidine Gluconate Cloth  6 each Topical Daily   clotrimazole   Topical BID   enoxaparin (LOVENOX) injection  40 mg Subcutaneous Q24H   hydrALAZINE  25 mg Oral Q8H   metoprolol succinate  100 mg Oral Daily   pantoprazole  40 mg Oral Daily   polyethylene glycol  17 g Oral Daily   promethazine  12.5 mg Oral TID AC   sodium chloride flush  3 mL Intravenous Q12H   sodium chloride  1 g Oral TID WC   zolpidem  5 mg Oral Once    PRN meds: acetaminophen **OR** acetaminophen, albuterol, butalbital-acetaminophen-caffeine, dicyclomine, hydrOXYzine, labetalol   Infusions:    Antimicrobials: Anti-infectives (From admission, onward)    Start     Dose/Rate Route Frequency Ordered Stop   07/13/22 1015  fluconazole (DIFLUCAN) tablet 150 mg        150 mg Oral  Once 07/13/22 1751 07/13/22 1220       Skin assessment:      Diet:  Diet Order             Diet regular Room service appropriate? Yes; Fluid consistency: Thin; Fluid restriction: 1500 mL Fluid  Diet effective now                  Nutritional status:  Body mass index is 24.11 kg/m.           Status is: Observation Level of care: Telemetry Medical  Continue in-hospital care  because: Continues to have low sodium level.  Dispo: The patient is from: Home              Anticipated d/c is to: Plan to discharge home tomorrow on oral Phenergan.              Patient currently is not medically stable to d/c.   Difficult to place patient No   Objective: Vitals:   07/13/22 0722 07/13/22 1504  BP: (!) 118/57 119/67  Pulse: 70 71  Resp: 16 16  Temp: 98.9 F (37.2 C) 98.8 F (37.1 C)  SpO2: 98% 99%    Intake/Output Summary (Last 24 hours) at 07/13/2022 1525 Last data filed at 07/13/2022 0536 Gross per 24 hour  Intake 240 ml  Output 800 ml  Net -560 ml   Filed Weights   07/11/22 2219 07/12/22 0500 07/13/22 0535  Weight: 66.2 kg 67 kg 63.7 kg   Weight change: -2.5 kg Body mass index is 24.11 kg/m.   Physical Exam: General exam: Pleasant, middle-aged Caucasian female. Skin: No rashes, lesions or ulcers. HEENT: Atraumatic, normocephalic, no obvious bleeding Lungs: Clear to auscultation bilaterally CVS:  Regular rate and rhythm, normal GI/Abd soft, nondistended, nontender, bowel sound present CNS: Alert, awake, oriented x 3 Psychiatry: Depressed look Extremities: No pedal, no calf tenderness  Data Review: I have personally reviewed the laboratory data and studies available.  F/u labs ordered Unresulted Labs (From admission, onward)     Start     Ordered   07/13/22 0500  Creatinine, serum  (enoxaparin (LOVENOX)    CrCl >/= 30 ml/min)  Weekly,   R     Comments: while on enoxaparin therapy    07/06/22 1300            Total time spent in review of labs and imaging, patient evaluation, formulation of plan, documentation and communication with family:  25 minutes  Signed, Terrilee Croak, MD [facility] 07/13/2022

## 2022-07-13 NOTE — Progress Notes (Signed)
Patient is now requesting internal vaginal cream for itching.  Currently has external cream. Sent secure chat to on call MD.  Day shift nurse to follow up.

## 2022-07-13 NOTE — Progress Notes (Signed)
Patient ID: Katelyn [name], female   DOB: 01-Dec-1960, 62 y.o.   MRN: [id number] S: patient seen and examined bedside. No acute events. She reports that she still feels sick to her stomach which in unchanged despite zofran. No other complaints O:BP (!) 118/57 (BP Location: Right Arm)   Pulse 70   Temp 98.9 F (37.2 C) (Oral)   Resp 16   Ht 5\' 4"  (1.626 m)   Wt 63.7 kg   SpO2 98%   BMI 24.11 kg/m   Intake/Output Summary (Last 24 hours) at 07/13/2022 1316 Last data filed at 07/13/2022 0536 Gross per 24 hour  Intake 240 ml  Output 800 ml  Net -560 ml   Intake/Output: I/O last 3 completed shifts: In: 960 [P.O.:960] Out: 1400 [Urine:1400]  Intake/Output this shift:  No intake/output data recorded. Weight change: -2.5 kg Gen: NAD CVS: RRR Resp:CTA Abd: +BS, soft, NT/ND Ext: no edema  Recent Labs  Lab 07/07/22 1542 07/08/22 0351 07/09/22 0324 07/09/22 1145 07/10/22 0306 07/10/22 0724 07/11/22 0609 07/11/22 1058 07/11/22 1750 07/11/22 2253 07/12/22 0604 07/12/22 1121 07/13/22 0554  NA 127* 129* 126*   < > 124*   < > 124* 124* 124* 124* 126* 127* 129*  K 3.7 3.7 3.3*  --  5.1  --  4.1  --   --   --  4.2  --  4.1  CL 96* 96* 93*  --  95*  --  94*  --   --   --  95*  --  99  CO2 23 24 25   --  22  --  22  --   --   --  24  --  23  GLUCOSE 97 99 102*  --  105*  --  112*  --   --   --  100*  --  104*  BUN <5* <5* <5*  --  <5*  --  <5*  --   --   --  6*  --  9  CREATININE 0.52 0.47 0.57  --  0.72  --  0.63  --   --   --  0.69  --  0.81  ALBUMIN  --   --   --   --   --   --   --   --   --   --   --   --  2.7*  CALCIUM 8.0* 8.4* 8.7*  --  8.8*  --  8.7*  --   --   --  8.7*  --  8.4*  AST  --   --   --   --   --   --   --   --   --   --   --   --  20  ALT  --   --   --   --   --   --   --   --   --   --   --   --  19   < > = values in this interval not displayed.   Liver Function Tests: Recent Labs  Lab 07/13/22 0554  AST 20  ALT 19  ALKPHOS 53  BILITOT 0.2*  PROT 5.3*   ALBUMIN 2.7*   No results for input(s): "LIPASE", "AMYLASE" in the last 168 hours.  No results for input(s): "AMMONIA" in the last 168 hours. CBC: Recent Labs  Lab 07/07/22 0509 07/10/22 0306 07/13/22 0554  WBC 5.4 5.9 6.4  HGB 11.5*  12.4 11.1*  HCT 33.4* 36.0 33.2*  MCV 87.2 87.8 90.5  PLT 300 315 262   Cardiac Enzymes: No results for input(s): "CKTOTAL", "CKMB", "CKMBINDEX", "TROPONINI" in the last 168 hours. CBG: No results for input(s): "GLUCAP" in the last 168 hours.  Iron Studies: No results for input(s): "IRON", "TIBC", "TRANSFERRIN", "FERRITIN" in the last 72 hours. Studies/Results: No results found.  amLODipine  10 mg Oral Daily   atorvastatin  40 mg Oral Daily   budesonide (PULMICORT) nebulizer solution  0.25 mg Nebulization BID   Chlorhexidine Gluconate Cloth  6 each Topical Daily   clotrimazole   Topical BID   enoxaparin (LOVENOX) injection  40 mg Subcutaneous Q24H   hydrALAZINE  25 mg Oral Q8H   metoprolol succinate  100 mg Oral Daily   ondansetron (ZOFRAN) IV  4 mg Intravenous Q6H   pantoprazole  40 mg Oral Daily   polyethylene glycol  17 g Oral Daily   sodium chloride flush  3 mL Intravenous Q12H   sodium chloride  1 g Oral TID WC   zolpidem  5 mg Oral Once    BMET    Component Value Date/Time   NA 129 (L) 07/13/2022 0554   NA 132 (L) 10/09/2012 1515   K 4.1 07/13/2022 0554   K 4.1 10/09/2012 1515   CL 99 07/13/2022 0554   CL 98 10/09/2012 1515   CO2 23 07/13/2022 0554   CO2 30 10/09/2012 1515   GLUCOSE 104 (H) 07/13/2022 0554   GLUCOSE 84 10/09/2012 1515   BUN 9 07/13/2022 0554   BUN 6 (L) 10/09/2012 1515   CREATININE 0.81 07/13/2022 0554   CREATININE 0.64 10/09/2012 1515   CALCIUM 8.4 (L) 07/13/2022 0554   CALCIUM 8.9 10/09/2012 1515   GFRNONAA >60 07/13/2022 0554   GFRNONAA >60 10/09/2012 1515   GFRAA >60 02/04/2020 2322   GFRAA >60 10/09/2012 1515   CBC    Component Value Date/Time   WBC 6.4 07/13/2022 0554   RBC 3.67 (L)  07/13/2022 0554   HGB 11.1 (L) 07/13/2022 0554   HGB 14.2 10/09/2012 1515   HCT 33.2 (L) 07/13/2022 0554   HCT 41.6 10/09/2012 1515   PLT 262 07/13/2022 0554   PLT 189 10/09/2012 1515   MCV 90.5 07/13/2022 0554   MCV 92 10/09/2012 1515   MCH 30.2 07/13/2022 0554   MCHC 33.4 07/13/2022 0554   RDW 13.2 07/13/2022 0554   RDW 12.7 10/09/2012 1515   LYMPHSABS 1.4 07/06/2022 0515   LYMPHSABS 2.4 10/09/2012 1515   MONOABS 0.7 07/06/2022 0515   MONOABS 0.4 10/09/2012 1515   EOSABS 0.0 07/06/2022 0515   EOSABS 0.0 10/09/2012 1515   BASOSABS 0.0 07/06/2022 0515   BASOSABS 0.0 10/09/2012 1515    Assessment/Plan:   Hyponatremia - initially related to HCTZ and SSRI but those were stopped with significant drop in Na level within 24 hours from 131 to 114.  Uosm 311, UNa 91, Sosm 265, urine specific gravity was low at 1.001.  Had normal cortisol level on previous admission.  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  CXR negative for acute pulmonary process but does have evidence of emphysema which could be source, she does report ongoing abdominal pain + N/V which is another possibility that can lead to SIADH.  Na up to 129 today. 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 Hopefully will further improve  especially with symptoms management. Replete K PRN as this will help with osmolality On salt tabs 1g TID, will c/w this Can check daily labs fluid restriction 1.5L/day Abdominal pain associated with N/V - CT scan without clear cause.  Plan per primary. Acute vision loss of left eye - MRI brain without acute process  Unclear etiology and don't believe electrolyte abnormalities would explain this. Hypokalemia - started KCl 40 mEq bid and improved to 5.1.  Kcl had been d/c'ed now. K WNL Urinary retention - Foley catheter in place will need Urology follow up. HTN - on amlodipine, hydralazine, and  metoprolol.  HCTZ held due to hyponatremia. BP acceptable Anemia - stable.  Nothing else to add at this time from a nephrology perspective. Recommend staying the course with salt tabs, fluid restriction, symptom management, and increased PO intake. Will sign off at this time. Please call with any questions/concerns.  Anthony Sar, MD [facility]

## 2022-07-13 NOTE — Progress Notes (Signed)
[facility] night cross cover note:   I was notified by RN of the patient's complaint of nausea, noting no existing order for prn antiemetic.  I subsequently placed order for prn IV Zofran.   Update: RN conveyed to me that the patient is complaining of some mild bilateral pleuritic chest discomfort in the setting of cough.  Patient refusing as needed Tylenol for this discomfort at the present time.  Vital signs appear stable.  I subsequently placed order for chest x-ray to further evaluate.    Babs Bertin, DO Hospitalist

## 2022-07-14 ENCOUNTER — Inpatient Hospital Stay (HOSPITAL_COMMUNITY): Payer: Medicaid Other

## 2022-07-14 DIAGNOSIS — E871 Hypo-osmolality and hyponatremia: Secondary | ICD-10-CM | POA: Diagnosis not present

## 2022-07-14 MED ORDER — PROMETHAZINE HCL 12.5 MG PO TABS: 12.5000 mg | ORAL_TABLET | Freq: Three times a day (TID) | ORAL | 0 refills | Status: DC

## 2022-07-14 NOTE — Progress Notes (Signed)
Physical Therapy Treatment Patient Details Name: Katelyn [name] MRN: [id number] DOB: 09-15-1960 Today's Date: 07/14/2022   History of Present Illness 62 y.o. female re-admitted 1/17 for Recurrent severe hyponatremia.  PMH significant for HTN, HLD, diastolic CHF, chronic hyponatremia COPD, GERD, anxiety/depression, IBS, interstitial cystitis, migraine, chronic smoking who was recently hospitalized twice. 1/1-1/5 for acute encephalopathy, low sodium level at 112, urinary retention requiring Foley catheter and discharged home.  1/12 to 1/16 for nausea, vomiting or diarrhea leading to hypotension and severe electrolyte derangements.    PT Comments    Pt received in bed, MD present. Pt may d/c home today though she has reservations about this as she reports feeling no better than when she came in and that she's worried her sodium or potassium will get out of balance again. Pt ambulated in hallway with supervision 80' with SPO2 96-98% and HR in 70's. Pt fatigues quickly but is at supervision level and she will have this at home with her son. Discussed activity level and progression for home in case she does leave today and reviewed use of IS with her. PT will continue to follow.    Recommendations for follow up therapy are one component of a multi-disciplinary discharge planning process, led by the attending physician.  Recommendations may be updated based on patient status, additional functional criteria and insurance authorization.  Follow Up Recommendations  Home health PT     Assistance Recommended at Discharge Intermittent Supervision/Assistance  Patient can return home with the following A little help with walking and/or transfers;Assistance with cooking/housework;Assist for transportation;Help with stairs or ramp for entrance;A little help with bathing/dressing/bathroom;Direct supervision/assist for medications management;Direct supervision/assist for financial management   Equipment  Recommendations  None recommended by PT    Recommendations for Other Services       Precautions / Restrictions Precautions Precautions: Fall Precaution Comments: watch HR and SPO2 Restrictions Weight Bearing Restrictions: No     Mobility  Bed Mobility Overal bed mobility: Independent Bed Mobility: Supine to Sit, Sit to Supine     Supine to sit: Independent Sit to supine: Independent   General bed mobility comments: pt moves slowly with all mobility but no need for physical assist    Transfers Overall transfer level: Modified independent Equipment used: None Transfers: Sit to/from Stand Sit to Stand: Modified independent (Device/Increase time)           General transfer comment: stable with sit>stand    Ambulation/Gait Ambulation/Gait assistance: Supervision Gait Distance (Feet): 80 Feet Assistive device: None Gait Pattern/deviations: Step-through pattern, Decreased stride length Gait velocity: decreased Gait velocity interpretation: 1.31 - 2.62 ft/sec, indicative of limited community ambulator   General Gait Details: MD requested O2 sats monitored during ambulation. SPO2 remained 96-98%, HR in 70's. Pt mild DOE 2/4 and fatigues quickly   Marine scientist [place] (Stroke Patients Only)       Balance Overall balance assessment: Mild deficits observed, not formally tested Sitting-balance support: Feet supported Sitting balance-Leahy Scale: Good     Standing balance support: No upper extremity supported, During functional activity Standing balance-Leahy Scale: Good Standing balance comment: limitations evident but no LOB with dynamic balance in controlled environment                            Cognition Arousal/Alertness: Awake/alert Behavior During Therapy: WFL for tasks assessed/performed Overall Cognitive Status: Within  Functional Limits for tasks assessed                                  General Comments: flat affect        Exercises      General Comments General comments (skin integrity, edema, etc.): discussed activity level and progression upon return home as well mgmt of back pain. Brought IS but pt reports that she has one at home already so reviewed use only.      Pertinent Vitals/Pain Pain Assessment Pain Assessment: Faces Faces Pain Scale: Hurts little more Pain Location: back Pain Descriptors / Indicators: Aching, Discomfort Pain Intervention(s): Monitored during session    Home Living                          Prior Function            PT Goals (current goals can now be found in the care plan section) Acute Rehab PT Goals Patient Stated Goal: to get better PT Goal Formulation: With patient Time For Goal Achievement: 07/26/22 Potential to Achieve Goals: Good Progress towards PT goals: Progressing toward goals    Frequency    Min 3X/week      PT Plan Current plan remains appropriate    Co-evaluation              AM-PAC PT "6 Clicks" Mobility   Outcome Measure  Help needed turning from your back to your side while in a flat bed without using bedrails?: None Help needed moving from lying on your back to sitting on the side of a flat bed without using bedrails?: None Help needed moving to and from a bed to a chair (including a wheelchair)?: None Help needed standing up from a chair using your arms (e.g., wheelchair or bedside chair)?: None Help needed to walk in hospital room?: A Little Help needed climbing 3-5 steps with a railing? : A Lot 6 Click Score: 21    End of Session   Activity Tolerance: Patient tolerated treatment well Patient left: with call bell/phone within reach;in bed Nurse Communication: Mobility status PT Visit Diagnosis: Other abnormalities of gait and mobility (R26.89);Muscle weakness (generalized) (M62.81);History of falling (Z91.81);Difficulty in walking, not elsewhere classified  (R26.2);Pain Pain - part of body:  (back)     Time: 2841-3244 PT Time Calculation (min) (ACUTE ONLY): 21 min  Charges:  $Gait Training: 8-22 mins                     [place] chat preferred Office [place] 07/14/2022, 2:08 PM

## 2022-07-14 NOTE — Progress Notes (Signed)
Katelyn [name] to be D/C'd  per MD order.  Discussed with the patient and all questions fully answered.  VSS, Skin clean, dry and intact without evidence of skin break down, no evidence of skin tears noted.  IV catheter discontinued intact. Site without signs and symptoms of complications. Dressing and pressure applied.  Patient will be discharge with foley catheter and teaching has been done. Patient refuse a leg bag due to the fact she has one at home.  An After Visit Summary was printed and given to the patient.   D/c education completed with patient/family including follow up instructions, medication list, d/c activities limitations if indicated, with other d/c instructions as indicated by MD - patient able to verbalize understanding, all questions fully answered.   Patient instructed to return to ED, call 911, or call MD for any changes in condition.   Patient to be escorted via [place], and D/C home via private auto.

## 2022-07-14 NOTE — Discharge Summary (Signed)
Physician Discharge Summary  Katelyn [name] QMV:[id number] DOB: 10-Sep-1960 DOA: 07/06/2022  PCP: Alease Medina, MD  Admit date: 07/06/2022 Discharge date: 07/14/2022  Admitted From: Home Discharge disposition: Home with home health  Recommendations at discharge:  Recommend salt tablets and fluid restriction.  Follow-up with primary care provider in 1 to 2 weeks for repeat sodium level Foley catheter to continue until evaluation by urology as an outpatient.  Ambulatory referral given. Use Phenergan as needed for nausea.  Brief narrative: Katelyn [name] is a 62 y.o. female with PMH significant for HTN, HLD, diastolic CHF, chronic hyponatremia COPD, GERD, anxiety/depression, IBS, interstitial cystitis, migraine, chronic smoking who was recently hospitalized twice.  1/1-1/5 for acute encephalopathy, low sodium level at 112, urinary retention requiring Foley catheter and discharged home. 1/12 to 1/16 for nausea, vomiting or diarrhea leading to hypotension and severe electrolyte derangements.  Her sodium level was low at 104, potassium of 2.2.  He also had significant urinary retention due to Foley catheter malfunction.  She was in circulatory shock and required Levophed briefly.  After clinical and labs improvement, ultimately discharged home on 1/16 on salt tablets However within 24 hours of discharge, patient started complaining of abdominal pain, nausea vomiting again and was brought to the ED by EMS on 1/17  In the ED, patient was afebrile, heart rate in 80s, blood pressure 183/105, breathing on room air  Labs showed sodium level significantly low at 114 which is an acute drop from 131 from 24 hrs ago.  Potassium low at 3.4.  CBC unremarkable CT of the abdomen pelvis with contrast showed no acute abnormality.  Admitted to [facility]  Subjective: Patient was seen and examined this morning.  Lying down in bed.  Complains of nausea persistent.  But able to hold down all her meal for last 3  days.   Complain of shortness of breath but was able to maintain O2 sat more than 96% on room air on ambulation.  Hospital course Recurrent severe hyponatremia Patient seems to have chronic hyponatremia at least since 2014. In the last 3 weeks, patient has been hospitalized twice with severe hyponatremia.  In the most recent hospitalization, sodium level was significantly at 104.  Workup there showed hypoosmolar hyponatremia with urine osmolality around 200, urine sodium level of 71, normal cortisol level, ACTH and TSH.  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 HCTZ was stopped. Patient was discharged on salt tablets and fluid restriction. Presented this time with significantly low sodium level at 114 which is an acute drop from 131 from 24 hours prior.  Sodium level improving already has trended below. Nephrology consult appreciated. Extensive workup done.  Patient is currently on salt tablet and fluid restriction.  Sodium level gradually improving.  Nephrology signed off with recommendation to continue salt tablets at discharge.  Will keep her on salt tablets for a month.  She needs to see her PCP for repeat blood work in next 1 to 2 weeks. Recent Labs  Lab 07/10/22 0724 07/10/22 1747 07/10/22 2329 07/11/22 0609 07/11/22 1058 07/11/22 1750 07/11/22 2253 07/12/22 0604 07/12/22 1121 07/13/22 0554  NA 125* 122* 124* 124* 124* 124* 124* 126* 127* 129*   Abdominal pain History of IBS, GERD Patient continues to have abdominal pain that was present during recent admission.  Presumed viral gastroenteritis at that time as symptoms improved without significant intervention.   CT abdomen pelvis showed prominent stool in the colon.  No other acute abnormalities Currently on  oral Phenergan scheduled Premeal.  Still complains of nausea but she has been able to hold down all the meals for last the 3 days. Continue Phenergan as needed at home.   Urinary retention During  her hospitalization on 1/1, she was noted to have urinary retention and Foley catheter was inserted.  In subsequent admission, she was noted to have significant urine retention and hydronephrosis due to catheter malfunction.  Catheter was changed.   Repeat CT scan this admission showed improvement in retention with some residual right-sided hydronephrosis. Continue Foley catheter Follow-up with urology as an outpatient.  Referral given   Chronic diastolic CHF Essential hypertension PTA on metoprolol succinate 100 mg daily, amlodipine 10 mg daily echo performed earlier this month so EF 70-75%, G1 DD, normal RV function. Continue metoprolol and amlodipine.  Also on hydralazine 25 mg 3 times daily.  Continue to monitor.  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  General exam: Pleasant, middle-aged Caucasian female. Skin: No rashes, lesions or ulcers. HEENT: Atraumatic, normocephalic, no obvious bleeding Lungs: Clear to auscultation bilaterally CVS: Regular rate and rhythm, normal GI/Abd soft, nondistended, nontender, bowel sound present CNS: Alert, awake, oriented x 3 Psychiatry: Mood appropriate Extremities: No pedal, no calf tenderness  Follow ups:    Follow-up Information     [facility] Follow up.   Specialty: Home Health  Services Contact information: [address] [place] [phone]         [phone], MD Follow up.   Specialty: Family Medicine Contact information: [address] [place] [address]                 Discharge Instructions:   Discharge Instructions     Ambulatory referral to Urology   Complete by: As directed    Has Foley for urinary retention.  Needs voiding trial   Call MD for:  difficulty breathing, headache or visual disturbances   Complete by: As directed    Call MD for:  extreme fatigue   Complete by: As directed    Call MD for:  hives   Complete by: As directed    Call MD for:  persistant dizziness or light-headedness   Complete by: As directed    Call MD for:  persistant nausea and vomiting   Complete by: As directed    Call MD for:  severe uncontrolled pain   Complete by: As directed    Call MD for:  temperature >100.4   Complete by: As directed    Diet general   Complete by: As directed    Discharge instructions   Complete by: As directed  Recommendations at discharge:   Recommend salt tablets and fluid restriction.  Follow-up with primary care provider in 1 to 2 weeks for repeat sodium level  Foley catheter to continue until evaluation by urology as an outpatient.  Ambulatory referral given.  Use Phenergan as needed for nausea.  General discharge instructions: Follow with Primary MD Ziglar, Eli PhillipsSusan K, MD in 7 days  Please request your PCP  to go over your hospital tests, procedures, radiology results at the follow up. Please get your medicines reviewed and adjusted.  Your PCP may decide to repeat certain labs or tests as needed. Do not drive, operate heavy machinery, perform activities at heights, swimming or participation in water activities or provide baby sitting services if your were admitted for syncope or siezures until you have seen by Primary MD or a Neurologist and advised to do so again. [place] Controlled Substance Reporting System database was reviewed. Do not drive, operate heavy machinery, perform activities at heights, swim, participate in water activities or provide baby-sitting services while on medications for pain, sleep and mood until your outpatient physician has reevaluated you and advised to do so again.  You are strongly recommended to comply with the dose, frequency and duration of prescribed medications. Activity: As tolerated with Full fall precautions use walker/cane & assistance as needed Avoid using any recreational substances like cigarette, tobacco, alcohol, or non-prescribed drug. If you experience worsening of your admission symptoms, develop shortness of breath, life threatening emergency, suicidal or homicidal thoughts you must seek medical attention immediately by calling 911 or calling your MD immediately  if symptoms less severe. You must read complete instructions/literature along with all the possible adverse reactions/side effects for all the medicines you take and that have been prescribed to you. Take any new medicine only after you have completely understood and accepted all the possible adverse reactions/side effects.  Wear Seat belts while driving. You were cared for by a hospitalist during your hospital stay. If you have any questions about your discharge medications or the care you received while you were in the hospital after you are discharged, you can call the unit and ask to speak with the hospitalist or the covering physician. Once you are discharged, your primary care physician will handle any further medical issues. Please note that NO REFILLS for any discharge medications will be authorized once you are discharged, as it is imperative that you return to your primary care physician (or establish a relationship with a primary care physician if you do not have one).   Increase activity slowly   Complete by: As directed        Discharge  Medications:   Allergies as of 07/14/2022       Reactions   Ciprofloxacin Other (See Comments)   Headache   Macrobid [nitrofurantoin Monohyd Macro] Itching   Sulfa Antibiotics Itching        Medication List     STOP taking these medications    benzonatate 100 MG capsule Commonly known as: TESSALON   cyclobenzaprine 10 MG tablet Commonly known as: FLEXERIL   FLUoxetine 10 MG capsule Commonly known as: PROZAC   guaiFENesin 600 MG 12 hr tablet Commonly known as: MUCINEX   QUEtiapine 100 MG tablet Commonly known as: SEROQUEL   Uribel 118 MG Caps       TAKE these medications    albuterol 108 (90 Base) MCG/ACT inhaler Commonly known as: VENTOLIN HFA Inhale 2 puffs into the lungs every 6 (six) hours as  needed for wheezing or shortness of breath.   amLODipine 10 MG tablet Commonly known as: NORVASC Take 1 tablet (10 mg total) by mouth daily.   atorvastatin 40 MG tablet Commonly known as: LIPITOR Take 40 mg by mouth daily.   butalbital-acetaminophen-caffeine 50-325-40 MG tablet Commonly known as: FIORICET Take 1 tablet by mouth daily as needed for headache or migraine.   dicyclomine 10 MG capsule Commonly known as: BENTYL Take 1 capsule (10 mg total) by mouth 3 (three) times daily as needed for spasms.   esomeprazole 40 MG capsule Commonly known as: NEXIUM Take 40 mg by mouth daily at 12 noon.   Flovent HFA 220 MCG/ACT inhaler Generic drug: fluticasone Inhale 2 puffs into the lungs 2 (two) times daily.   gabapentin 300 MG capsule Commonly known as: Neurontin Take 1 capsule (300 mg total) by mouth 3 (three) times daily as needed (Pain).   hydrOXYzine 25 MG tablet Commonly known as: ATARAX Take 25 mg by mouth 3 (three) times daily as needed for anxiety.   metoprolol succinate 100 MG 24 hr tablet Commonly known as: TOPROL-XL Take 100 mg by mouth daily.   ondansetron 4 MG tablet Commonly known as: ZOFRAN Take 1 tablet (4 mg total) by mouth every 4  (four) hours as needed for nausea or vomiting.   polyethylene glycol 17 g packet Commonly known as: MIRALAX / GLYCOLAX Take 17 g by mouth daily. What changed:  when to take this reasons to take this   promethazine 12.5 MG tablet Commonly known as: PHENERGAN Take 1 tablet (12.5 mg total) by mouth 3 (three) times daily before meals for 7 days.   senna-docusate 8.6-50 MG tablet Commonly known as: Senokot-S Take 2 tablets by mouth 2 (two) times daily.   sodium chloride 1 g tablet Take 1 tablet (1 g total) by mouth 2 (two) times daily with a meal.   Vitamin D (Ergocalciferol) 1.25 MG (50000 UNIT) Caps capsule Commonly known as: DRISDOL Take 50,000 Units by mouth once a week. Monday         The results of significant diagnostics from this hospitalization (including imaging, microbiology, ancillary and laboratory) are listed below for reference.    Procedures and Diagnostic Studies:   DG CHEST PORT 1 VIEW  Result Date: 07/07/2022 CLINICAL DATA:  Shortness of breath. Central chest pain and congestion. Cough. EXAM: PORTABLE CHEST 1 VIEW COMPARISON:  07/01/2022 FINDINGS: Atherosclerotic calcification of the aortic arch. Cardiac and mediastinal margins appear normal. Tapering of the peripheral pulmonary vasculature favors emphysema. Otherwise the lungs appear clear. No blunting of the costophrenic angles. Stable appearance of the cervical spine hardware. IMPRESSION: 1. No acute findings. 2.  Emphysema (ICD10-J43.9). Electronically Signed   By: Van Clines M.D.   On: 07/07/2022 14:50   CT Abdomen Pelvis W Contrast  Result Date: 07/06/2022 CLINICAL DATA:  Acute abdominal pain EXAM: CT ABDOMEN AND PELVIS WITH CONTRAST TECHNIQUE: Multidetector CT imaging of the abdomen and pelvis was performed using the standard protocol following bolus administration of intravenous contrast. RADIATION DOSE REDUCTION: This exam was performed according to the departmental dose-optimization program which  includes automated exposure control, adjustment of the mA and/or kV according to patient size and/or use of iterative reconstruction technique. CONTRAST:  42mL OMNIPAQUE IOHEXOL 350 MG/ML SOLN COMPARISON:  07/01/2022 FINDINGS: Lower chest: Small type 1 hiatal hernia. Descending thoracic aortic atherosclerotic vascular calcification. Centrilobular emphysema the lung bases. Hepatobiliary: Cholecystectomy. Steatosis in segment 4 adjacent to the falciform ligament. Pancreas: Unremarkable Spleen: Unremarkable Adrenals/Urinary  Tract: Borderline right hydronephrosis terminating at the UPJ. Foley catheter in the urinary bladder. No urinary tract calculi identified. Adrenal glands unremarkable. Stomach/Bowel: No dilated bowel. Borderline prominence of stool in the proximal colon but not in the distal colon. No substantial degree of bowel wall thickening or specific acute abnormality involving the bowel identified. Vascular/Lymphatic: Atherosclerosis is present, including aortoiliac atherosclerotic disease. Reproductive: Uterus absent.  Adnexa unremarkable. Other: Small amounts of subcutaneous gas along the anterior abdomen wall are likely injection related. Musculoskeletal: Grade 1 degenerative anterolisthesis at L4-5, with disc uncovering and right foraminal disc protrusion contributing to right foraminal impingement and likely mild central narrowing of the thecal sac at this level. Central disc protrusion extending cephalad at L3-4 with associated vacuum disc phenomenon, image 66 series 7, no overt impingement. IMPRESSION: 1. A specific cause for the patient's acute abdominal pain is not identified. 2. Right foraminal impingement at L4-5 due to a foraminal disc protrusion. 3. Small type 1 hiatal hernia. 4. Borderline right hydronephrosis terminating at the UPJ. No urinary tract calculi are identified. 5. Borderline prominence of stool in the proximal colon but not in the distal colon. 6. Aortic atherosclerosis. 7.  Emphysema. Aortic Atherosclerosis (ICD10-I70.0) and Emphysema (ICD10-J43.9). Electronically Signed   By: Gaylyn Rong M.D.   On: 07/06/2022 12:04   DG Abdomen 1 View  Result Date: 07/06/2022 CLINICAL DATA:  Abdominal pain and nausea. EXAM: ABDOMEN - 1 VIEW COMPARISON:  None Available. FINDINGS: No gaseous bowel dilatation to suggest obstruction. Prominent stool volume noted left colon. Surgical clips in the right upper quadrant suggest prior cholecystectomy. Bones are diffusely demineralized. IMPRESSION: Nonspecific bowel gas pattern with somewhat prominent stool volume in the descending colon. Electronically Signed   By: Kennith Center M.D.   On: 07/06/2022 05:45     Labs:   Basic Metabolic Panel: Recent Labs  Lab 07/09/22 0324 07/09/22 1145 07/09/22 1508 07/10/22 0306 07/10/22 0724 07/11/22 0609 07/11/22 1058 07/11/22 1750 07/11/22 2253 07/12/22 0604 07/12/22 1121 07/13/22 0554  NA 126* 126*   < > 124*   < > 124*   < > 124* 124* 126* 127* 129*  K 3.3*  --   --  5.1  --  4.1  --   --   --  4.2  --  4.1  CL 93*  --   --  95*  --  94*  --   --   --  95*  --  99  CO2 25  --   --  22  --  22  --   --   --  24  --  23  GLUCOSE 102*  --   --  105*  --  112*  --   --   --  100*  --  104*  BUN <5*  --   --  <5*  --  <5*  --   --   --  6*  --  9  CREATININE 0.57  --   --  0.72  --  0.63  --   --   --  0.69  --  0.81  CALCIUM 8.7*  --   --  8.8*  --  8.7*  --   --   --  8.7*  --  8.4*  MG  --  1.7  --   --   --   --   --   --   --   --   --   --    < > =  values in this interval not displayed.   GFR Estimated Creatinine Clearance: 62.2 mL/min (by C-G formula based on SCr of 0.81 mg/dL). Liver Function Tests: Recent Labs  Lab 07/13/22 0554  AST 20  ALT 19  ALKPHOS 53  BILITOT 0.2*  PROT 5.3*  ALBUMIN 2.7*   No results for input(s): "LIPASE", "AMYLASE" in the last 168 hours. No results for input(s): "AMMONIA" in the last 168 hours. Coagulation profile No results for  input(s): "INR", "PROTIME" in the last 168 hours.  CBC: Recent Labs  Lab 07/10/22 0306 07/13/22 0554  WBC 5.9 6.4  HGB 12.4 11.1*  HCT 36.0 33.2*  MCV 87.8 90.5  PLT 315 262   Cardiac Enzymes: No results for input(s): "CKTOTAL", "CKMB", "CKMBINDEX", "TROPONINI" in the last 168 hours. BNP: Invalid input(s): "POCBNP" CBG: No results for input(s): "GLUCAP" in the last 168 hours. D-Dimer No results for input(s): "DDIMER" in the last 72 hours. Hgb A1c No results for input(s): "HGBA1C" in the last 72 hours. Lipid Profile No results for input(s): "CHOL", "HDL", "LDLCALC", "TRIG", "CHOLHDL", "LDLDIRECT" in the last 72 hours. Thyroid function studies No results for input(s): "TSH", "T4TOTAL", "T3FREE", "THYROIDAB" in the last 72 hours.  Invalid input(s): "FREET3" Anemia work up No results for input(s): "VITAMINB12", "FOLATE", "FERRITIN", "TIBC", "IRON", "RETICCTPCT" in the last 72 hours. Microbiology No results found for this or any previous visit (from the past 240 hour(s)).  Time coordinating discharge: 35 minutes  Signed: Chairty Toman  [facility] 07/14/2022, 1:16 PM

## 2022-07-14 NOTE — TOC CM/SW Note (Signed)
Patient discharging home with foley.   Due to foley home health  cannot see her until she sees urology outpatient. She has an appointment 2/9. PCP will not manage foley and urology will not until she is seen in office.   Patient has had a foley at home before and states she knows how to mange.   Bedside nurse will review foley care.    Patient aware and voiced understanding to all of above

## 2022-07-14 NOTE — Progress Notes (Signed)
Mobility Specialist - Progress Note   07/14/22 0900  Mobility  Activity Ambulated with assistance in hallway  Level of Assistance Standby assist, set-up cues, supervision of patient - no hands on  Assistive Device None  Distance Ambulated (ft) 80 ft  Activity Response Tolerated fair  Mobility Referral Yes  $Mobility charge 1 Mobility    Pt received in bed agreeable to mobility. C/o SOB, encouraged PLB. Will monitor O2 saturation next session. Left in bed w/ call bell in reach.   [place] Specialist Please contact via SecureChat or Rehab office at [phone]

## 2022-07-29 ENCOUNTER — Ambulatory Visit (INDEPENDENT_AMBULATORY_CARE_PROVIDER_SITE_OTHER): Payer: Medicaid Other | Admitting: Physician Assistant

## 2022-07-29 ENCOUNTER — Encounter: Payer: Self-pay | Admitting: Physician Assistant

## 2022-07-29 ENCOUNTER — Other Ambulatory Visit
Admission: RE | Admit: 2022-07-29 | Discharge: 2022-07-29 | Disposition: A | Discharge status: Home / Self Care | Payer: Medicaid Other | Source: Ambulatory Visit | Attending: Physician Assistant | Admitting: Physician Assistant

## 2022-07-29 VITALS — BP 154/94 | HR 88 | Ht 64.0 in | Wt 152.0 lb

## 2022-07-29 DIAGNOSIS — R339 Retention of urine, unspecified: Secondary | ICD-10-CM | POA: Diagnosis not present

## 2022-07-29 DIAGNOSIS — H53132 Sudden visual loss, left eye: Secondary | ICD-10-CM | POA: Diagnosis not present

## 2022-07-29 LAB — SEDIMENTATION RATE: Sed Rate: 16 mm/hr (ref 0–30)

## 2022-07-29 LAB — CBC
HCT: 40.5 % (ref 36.0–46.0)
Hemoglobin: 13.3 g/dL (ref 12.0–15.0)
MCH: 29.3 pg (ref 26.0–34.0)
MCHC: 32.8 g/dL (ref 30.0–36.0)
MCV: 89.2 fL (ref 80.0–100.0)
Platelets: 268 10*3/uL (ref 150–400)
RBC: 4.54 MIL/uL (ref 3.87–5.11)
RDW: 13.2 % (ref 11.5–15.5)
WBC: 5.4 10*3/uL (ref 4.0–10.5)
nRBC: 0 % (ref 0.0–0.2)

## 2022-07-29 LAB — C-REACTIVE PROTEIN: CRP: <0.5 mg/dL (ref <1.0)

## 2022-07-29 NOTE — Progress Notes (Signed)
Cath Change/ Replacement  Patient is present today for a catheter change due to urinary retention.  55m of water was removed from the balloon, a 16FR coude foley cath was removed without difficulty.  Patient was cleaned and prepped in a sterile fashion with betadine and 2% lidocaine jelly was instilled into the urethra. A 16 FR foley cath was replaced into the bladder, no complications were noted. Urine return was noted 536mand urine was clear in color. The balloon was filled with 1072mf sterile water. A night bag was attached for drainage.  Patient tolerated well.    Performed by: SamDebroah LoopA-C  Additional notes: He was admitted since her last clinic visit with severe hyponatremia.  [facility]  Today she reports she acutely lost vision in her left eye during her recent admission around 07/09/2022.  She states this was accompanied by a persistent frontotemporal headache.  She reported this to her hospitalist and an MR brain was performed with no significant findings.  She was treated for migraine with Fioricet.  She reports that the Fioricet temporarily helped her headache, however her vision never returned.  She denies blurry vision or reduced field of vision and states that her left visual field is completely absent.  She denies sensitivity to light or sound and states this is never happened to her before.  On physical exam, she is diffusely tender throughout her forehead and bilateral temples.  I contacted Dr. BraWallace Goingd he will see her in clinic this afternoon for evaluation of possible temporal arteritis.  I sent her from clinic to the hospital lab for stat CBC, ESR, and CRP.  I encouraged her to keep plans for urgent follow-up this afternoon and she expressed understanding.  Follow up: Return in about 4 weeks (around 08/26/2022) for Catheter exchange.

## 2022-08-29 ENCOUNTER — Ambulatory Visit (INDEPENDENT_AMBULATORY_CARE_PROVIDER_SITE_OTHER): Payer: Medicaid Other | Admitting: Physician Assistant

## 2022-08-29 VITALS — BP 150/96 | HR 84 | Ht 64.0 in | Wt 152.0 lb

## 2022-08-29 DIAGNOSIS — R339 Retention of urine, unspecified: Secondary | ICD-10-CM | POA: Diagnosis not present

## 2022-08-29 DIAGNOSIS — H53132 Sudden visual loss, left eye: Secondary | ICD-10-CM

## 2022-08-29 NOTE — Progress Notes (Signed)
Cath Change/ Replacement  Patient is present today for a catheter change due to urinary retention.  53m of water was removed from the balloon, a 16FR foley cath was removed without difficulty.  Patient was cleaned and prepped in a sterile fashion with betadinea. A 16 FR Silastic foley cath was replaced into the bladder, no complications were noted. Urine return was noted 1065mand urine was clear in color. The balloon was filled with 1050mf sterile water. A night bag was attached for drainage.  Patient tolerated well.    Performed by: SamDebroah LoopA-C   Additional notes: Tubing appears encrusted, suspect catheter was draining inadequately prior to exchange 2/2 this. Will switch her to Silastic catheters moving forward to reduce risk for encrustation.  She reports she did not see ophthalmology last month for her acute vision loss because they do not take her insurance. Will refer elsewhere today.  Follow up: Return in about 4 weeks (around 09/26/2022) for Catheter exchange.

## 2022-09-29 ENCOUNTER — Encounter: Payer: Self-pay | Admitting: Physician Assistant

## 2022-09-29 ENCOUNTER — Ambulatory Visit (INDEPENDENT_AMBULATORY_CARE_PROVIDER_SITE_OTHER): Payer: Medicaid Other | Admitting: Physician Assistant

## 2022-09-29 VITALS — BP 165/98 | HR 90 | Ht 64.0 in | Wt 150.4 lb

## 2022-09-29 DIAGNOSIS — R3989 Other symptoms and signs involving the genitourinary system: Secondary | ICD-10-CM

## 2022-09-29 DIAGNOSIS — T83011A Breakdown (mechanical) of indwelling urethral catheter, initial encounter: Secondary | ICD-10-CM | POA: Diagnosis not present

## 2022-09-29 DIAGNOSIS — R339 Retention of urine, unspecified: Secondary | ICD-10-CM

## 2022-09-29 DIAGNOSIS — Z466 Encounter for fitting and adjustment of urinary device: Secondary | ICD-10-CM | POA: Diagnosis not present

## 2022-09-29 LAB — MICROSCOPIC EXAMINATION: WBC, UA: >30 /hpf — AB (ref 0–5)

## 2022-09-29 LAB — URINALYSIS, COMPLETE
Bilirubin, UA: NEGATIVE
Glucose, UA: NEGATIVE
Ketones, UA: NEGATIVE
Nitrite, UA: NEGATIVE
Protein,UA: NEGATIVE
Specific Gravity, UA: 1.01 (ref 1.005–1.030)
Urobilinogen, Ur: 0.2 mg/dL (ref 0.2–1.0)
pH, UA: 7 (ref 5.0–7.5)

## 2022-09-29 MED ORDER — CEFDINIR 300 MG PO CAPS: 300.0000 mg | ORAL_CAPSULE | Freq: Two times a day (BID) | ORAL | 0 refills | Status: AC

## 2022-09-29 NOTE — Progress Notes (Signed)
09/29/2022 10:21 AM   Katelyn DallyEllen A Korf 11/22/1960 [id number]  CC: Chief Complaint  Patient presents with   Urinary Retention   HPI: Katelyn Dallyllen A Mule is a 62 y.o. female with PMH recurrent urinary with GYN since 2018 who previously declined further evaluation now managed with indwelling Foley [facility]; urinary frequency, urgency, and hesitancy; and possible IC who presents today for monthly catheter change.   Today she reports an approximate 2-week history of severe bladder spasms, worse with bowel movements, and nausea.  She denies fever, chills, or vomiting.  She has not yet scheduled [facility].  She is curious how long she is supposed to keep her catheter.  She admits that the urethral Foley is uncomfortable. She declines [facility] teaching.  Foley in place draining yellow urine. Significant encrustation noted diffusely throughout the drainage tubing and abundant urinary sediment floating within her urine.  PMH: Past Medical History:  Diagnosis Date   Anxiety    COPD (chronic obstructive pulmonary disease)    Depressive disorder    Elevated troponin 07/04/2022   GERD (gastroesophageal reflux disease)    Hiatal hernia    HLD (hyperlipidemia)    Hypertension    Hypomagnesemia 06/21/2022   Hypotension 07/02/2022   IBS (irritable bowel syndrome)    IC (interstitial cystitis)    Migraine    Nicotine dependence    Nontoxic goiter    Overactive bladder    Smoking     Surgical History: Past Surgical History:  Procedure Laterality Date   ABDOMINAL HYSTERECTOMY     ADENOIDECTOMY     CHOLECYSTECTOMY     HERNIA REPAIR     NECK SURGERY     SHOULDER SURGERY     TONSILLECTOMY      Home Medications:  Allergies as of 09/29/2022       Reactions   Ciprofloxacin Other (See Comments)   Headache   Macrobid [nitrofurantoin Monohyd Macro] Itching   Sulfa Antibiotics Itching        Medication List        Accurate as of September 29, 2022 10:21 AM. If  you have any questions, ask your nurse or doctor.          STOP taking these medications    dicyclomine 10 MG capsule Commonly known as: BENTYL Stopped by: Carman ChingSamantha Ilyanna Baillargeon, PA-C   hydrOXYzine 25 MG tablet Commonly known as: ATARAX Stopped by: Carman ChingSamantha Joeleen Wortley, PA-C   ondansetron 4 MG tablet Commonly known as: ZOFRAN Stopped by: Carman ChingSamantha Totiana Everson, PA-C   polyethylene glycol 17 g packet Commonly known as: MIRALAX / GLYCOLAX Stopped by: Carman ChingSamantha Paulena Servais, PA-C   promethazine 12.5 MG tablet Commonly known as: PHENERGAN Stopped by: Carman ChingSamantha Lesta Limbert, PA-C   senna-docusate 8.6-50 MG tablet Commonly known as: Senokot-S Stopped by: Carman ChingSamantha Kayton Ripp, PA-C   sodium chloride 1 g tablet Stopped by: Carman ChingSamantha Porchia Sinkler, PA-C       TAKE these medications    albuterol 108 (90 Base) MCG/ACT inhaler Commonly known as: VENTOLIN HFA Inhale 2 puffs into the lungs every 6 (six) hours as needed for wheezing or shortness of breath.   amLODipine 10 MG tablet Commonly known as: NORVASC Take 1 tablet (10 mg total) by mouth daily.   atorvastatin 40 MG tablet Commonly known as: LIPITOR Take 40 mg by mouth daily.   butalbital-acetaminophen-caffeine 50-325-40 MG tablet Commonly known as: FIORICET Take 1 tablet by mouth daily as needed for headache or migraine.   cefdinir 300 MG capsule Commonly known as:  OMNICEF Take 1 capsule (300 mg total) by mouth 2 (two) times daily for 5 days. Started by: Carman Ching, PA-C   esomeprazole 40 MG capsule Commonly known as: NEXIUM Take 40 mg by mouth daily at 12 noon.   Flovent HFA 220 MCG/ACT inhaler Generic drug: fluticasone Inhale 2 puffs into the lungs 2 (two) times daily.   gabapentin 300 MG capsule Commonly known as: Neurontin Take 1 capsule (300 mg total) by mouth 3 (three) times daily as needed (Pain).   hydrochlorothiazide 12.5 MG capsule Commonly known as: MICROZIDE Take 12.5 mg by mouth  daily.   metoprolol succinate 100 MG 24 hr tablet Commonly known as: TOPROL-XL Take 100 mg by mouth daily.   Vitamin D (Ergocalciferol) 1.25 MG (50000 UNIT) Caps capsule Commonly known as: DRISDOL Take 50,000 Units by mouth once a week. Monday        Allergies:  Allergies  Allergen Reactions   Ciprofloxacin Other (See Comments)    Headache   Macrobid [Nitrofurantoin Monohyd Macro] Itching   Sulfa Antibiotics Itching    Family History: Family History  Problem Relation Age of Onset   CAD Father        onset age 18   Heart disease Father    Kidney disease Father    Prostate cancer Father    Colon polyps Maternal Grandmother    Colon cancer Paternal Grandmother    Bladder Cancer Neg Hx    Kidney cancer Neg Hx     Social History:   reports that she has been smoking cigarettes. She has been smoking an average of .5 packs per day. She has never used smokeless tobacco. She reports that she does not drink alcohol and does not use drugs.  Physical Exam: BP (!) 165/98 (BP Location: Left Arm, Patient Position: Sitting, Cuff Size: Normal)   Pulse 90   Ht 5\' 4"  (1.626 m)   Wt 150 lb 6.4 oz (68.2 kg)   BMI 25.82 kg/m   Constitutional:  Alert and oriented, no acute distress, nontoxic appearing HEENT: [place], AT Cardiovascular: No clubbing, cyanosis, or edema Respiratory: Normal respiratory effort, no increased work of breathing Skin: No rashes, bruises or suspicious lesions Neurologic: Grossly intact, no focal deficits, moving all 4 extremities Psychiatric: Normal mood and affect  Cath Change/ Replacement  Patient is present today for a catheter change due to urinary retention.  31ml of water was removed from the balloon, a 16FR Silastic foley cath was removed without difficulty.  Patient was cleaned and prepped in a sterile fashion with betadine. A 16 FR Silastic foley cath was replaced into the bladder, no complications were noted. Urine return was noted and urine was pale  yellow in color. The balloon was filled with 75ml of sterile water. A night bag was attached for drainage.  Patient tolerated well.    Performed by: Carman Ching, PA-C   Assessment & Plan:   1. Suspected UTI 2 weeks of bladder spasms and nausea consistent with possible UTI, though she did have brisk urinary return with Foley replacement today consistent with catheter encrustation and poor drainage. Cannot rule out underlying catheter malfunction as contributory, see #3 below. Will start empiric cefdinir; urine sample obtained from new Foley today for UA and culture. - Urinalysis, Complete - CULTURE, URINE COMPREHENSIVE - cefdinir (OMNICEF) 300 MG capsule; Take 1 capsule (300 mg total) by mouth 2 (two) times daily for 5 days.  Dispense: 10 capsule; Refill: 0  2. Urinary catheter (Foley) change required Foley  exchanged as above.   We discussed that the plan was previously to keep Foley in until she undergoes UDS, however this remains unscheduled. I offered her [facility] teaching as an alternative but she declined.  3. Malfunction of Foley catheter, initial encounter We discussed staying well hydrated and increasing intake of citric acid to reduce encrustation. She reports she gets heartburn with acidic food intake so she is unable to do this.  She would benefit from upsizing her Foley to reduce her risk for encrustation, but I do not recommend this due to discomfort with urethral Foley. We discussed consideration of SPT as an alternative that would be more comfortable and allow for upsizing. She will consider this.  Return in about 1 month (around 10/29/2022) for Cath exchange .  Carman Ching, PA-C  [facility] [place] [address], [address] [place], [place] [address] [phone]

## 2022-10-03 LAB — CULTURE, URINE COMPREHENSIVE

## 2022-10-31 ENCOUNTER — Ambulatory Visit: Payer: Medicaid Other | Admitting: Physician Assistant

## 2022-11-02 ENCOUNTER — Encounter: Payer: Self-pay | Admitting: Physician Assistant

## 2022-11-02 ENCOUNTER — Ambulatory Visit (INDEPENDENT_AMBULATORY_CARE_PROVIDER_SITE_OTHER): Payer: Medicaid Other | Admitting: Physician Assistant

## 2022-11-02 VITALS — Temp 97.8°F

## 2022-11-02 DIAGNOSIS — R339 Retention of urine, unspecified: Secondary | ICD-10-CM

## 2022-11-02 DIAGNOSIS — N3289 Other specified disorders of bladder: Secondary | ICD-10-CM

## 2022-11-02 DIAGNOSIS — Z466 Encounter for fitting and adjustment of urinary device: Secondary | ICD-10-CM

## 2022-11-02 MED ORDER — GEMTESA 75 MG PO TABS: 1.0000 | ORAL_TABLET | Freq: Every day | ORAL | 0 refills | Status: DC

## 2022-11-02 NOTE — Patient Instructions (Signed)
51

## 2022-11-02 NOTE — Progress Notes (Signed)
Cath Change/ Replacement  Patient is present today for a catheter change due to urinary retention.  8 ml of water was removed from the balloon, a 16FR foley silastic cath was removed without difficulty.  Patient was cleaned and prepped in a sterile fashion with betadine and 2% lidocaine jelly was instilled into the urethra. A 16 FR foley silastic cath was replaced into the bladder, no complications were noted. Urine return was noted 30 ml and urine was cloudy in color. The balloon was filled with 10ml of sterile water. A night bag was attached for drainage.  Patient was given proper instruction on catheter care.    Performed by: August Luz, RN   Additional notes: Pt. Verbalizes pain with catheter and "bladder spasms".  Gemtesa 75 mg. Samples given per Hilton Sinclair, PA  Follow up: Return in about 1 month (around 12/03/2022) for catheter change.

## 2022-12-05 ENCOUNTER — Ambulatory Visit (INDEPENDENT_AMBULATORY_CARE_PROVIDER_SITE_OTHER): Payer: Medicaid Other | Admitting: Physician Assistant

## 2022-12-05 ENCOUNTER — Ambulatory Visit: Payer: Medicaid Other

## 2022-12-05 DIAGNOSIS — R339 Retention of urine, unspecified: Secondary | ICD-10-CM | POA: Diagnosis not present

## 2022-12-05 DIAGNOSIS — R110 Nausea: Secondary | ICD-10-CM

## 2022-12-05 DIAGNOSIS — R3989 Other symptoms and signs involving the genitourinary system: Secondary | ICD-10-CM

## 2022-12-05 DIAGNOSIS — Z466 Encounter for fitting and adjustment of urinary device: Secondary | ICD-10-CM | POA: Diagnosis not present

## 2022-12-05 DIAGNOSIS — R3129 Other microscopic hematuria: Secondary | ICD-10-CM | POA: Diagnosis not present

## 2022-12-05 DIAGNOSIS — R6883 Chills (without fever): Secondary | ICD-10-CM

## 2022-12-05 DIAGNOSIS — R1030 Lower abdominal pain, unspecified: Secondary | ICD-10-CM

## 2022-12-05 DIAGNOSIS — N3289 Other specified disorders of bladder: Secondary | ICD-10-CM

## 2022-12-05 MED ORDER — CEPHALEXIN 500 MG PO CAPS: 500.0000 mg | ORAL_CAPSULE | Freq: Two times a day (BID) | ORAL | 0 refills | Status: AC

## 2022-12-05 NOTE — Progress Notes (Signed)
Cath Change/ Replacement  Patient is present today for a catheter change due to urinary retention.  8ml of water was removed from the balloon, a 16FR Silastic foley cath was removed without difficulty.  Patient was cleaned and prepped in a sterile fashion with betadine. A 16 FR Silastic foley cath was replaced into the bladder, no complications were noted. Urine return was noted 30ml and urine was clear in color. The balloon was filled with 10ml of sterile water. A night bag was attached for drainage.  Patient tolerated well.    Performed by: Carman Ching, PA-C   Additional notes: She reports 2 weeks of lower abdominal pain, chills, nausea. She noticed gross hematuria 2 days ago, now resolved. Urine specimen obtained from new Foley today for culture; will start empiric Keflex for UTI.  She was unable to tolerate Gemtesa due to headaches. Removed from her med list.  UDS not yet scheduled, she doesn't know when she'll get to [facility] to do this. I had been deferring cystoscopy for microheme pending UDS, but with reports of gross hematuria and unclear UDS timeline, will schedule cysto for next month.  Follow up: Return in about 4 weeks (around 01/02/2023) for Cysto with Dr. Lonna Cobb.

## 2022-12-11 LAB — CULTURE, URINE COMPREHENSIVE

## 2023-01-04 ENCOUNTER — Encounter: Payer: Self-pay | Admitting: Urology

## 2023-01-04 ENCOUNTER — Ambulatory Visit (INDEPENDENT_AMBULATORY_CARE_PROVIDER_SITE_OTHER): Payer: Medicaid Other | Admitting: Urology

## 2023-01-04 VITALS — BP 147/88 | HR 87 | Ht 64.0 in | Wt 165.2 lb

## 2023-01-04 DIAGNOSIS — R310 Gross hematuria: Secondary | ICD-10-CM | POA: Diagnosis not present

## 2023-01-04 LAB — URINALYSIS, COMPLETE
Bilirubin, UA: NEGATIVE
Glucose, UA: NEGATIVE
Ketones, UA: NEGATIVE
Nitrite, UA: NEGATIVE
Protein,UA: NEGATIVE
Specific Gravity, UA: 1.01 (ref 1.005–1.030)
Urobilinogen, Ur: 0.2 mg/dL (ref 0.2–1.0)
pH, UA: 7 (ref 5.0–7.5)

## 2023-01-04 LAB — MICROSCOPIC EXAMINATION: WBC, UA: >30 /hpf — AB (ref 0–5)

## 2023-01-04 MED ORDER — CEPHALEXIN 500 MG PO CAPS: 500.0000 mg | ORAL_CAPSULE | Freq: Two times a day (BID) | ORAL | 0 refills | Status: AC

## 2023-01-04 NOTE — Progress Notes (Signed)
Simple Catheter Placement  Due to urinary retention patient is present today for a foley cath placement.  Patient was cleaned and prepped in a sterile fashion with betadine and 2% lidocaine jelly was instilled into the urethra. A 42f FR foley catheter was inserted, urine return was noted  50ml, urine was yellow in color.  The balloon was filled with 10cc of sterile water.  A night bag was attached for drainage. Patient was also given a night bag to take home and was given instruction on how to change from one bag to another.  Patient was given instruction on proper catheter care.  Patient tolerated well, no complications were noted   Performed by: Ples Specter CMA

## 2023-01-04 NOTE — Addendum Note (Signed)
Addended by: Levada Schilling on: 01/04/2023 01:59 PM   Modules accepted: Orders

## 2023-01-04 NOTE — Progress Notes (Signed)
   01/04/23  CC: No chief complaint on file.   HPI: Refer to Sam Vaillancourt's previous note 12/05/2022.  Notes increased urgency and intermittent hematuria over the last 1 month.  Her symptoms improved after taking the prescribed cephalexin at last visit then have returned.  Could not tolerate Gemtesa secondary to headaches   NED. A&Ox3.   No respiratory distress   Abd soft, NT, ND Normal external genitalia with patent urethral meatus  Cystoscopy Procedure Note  Patient identification was confirmed, informed consent was obtained, and patient was prepped using Betadine solution.  Lidocaine jelly was administered per urethral meatus.    Procedure: - Flexible cystoscope introduced, without any difficulty.   - Thorough search of the bladder revealed: - Mild inflammatory changes bladder base secondary to indwelling Foley  - No solid or papillary lesions - Ureteral orifices were normal in position and appearance.  Post-Procedure: - Patient tolerated the procedure well  Assessment/ Plan: No solid or papillary lesions on cystoscopy Foley catheter was replaced 1 month follow-up for catheter change Urine culture ordered Rx cephalexin 500 mg twice daily x 7 days   Riki Altes, MD

## 2023-01-04 NOTE — Addendum Note (Signed)
Addended by: Levada Schilling on: 01/04/2023 01:57 PM   Modules accepted: Orders

## 2023-01-07 LAB — CULTURE, URINE COMPREHENSIVE

## 2023-02-01 NOTE — Progress Notes (Unsigned)
Cath Change/ Replacement  Patient is present today for a catheter change due to urinary retention.  8 ml of water was removed from the balloon, a 16 FR foley cath was removed without difficulty.  Patient was cleaned and prepped in a sterile fashion with betadine.  A 16  FR foley cath was replaced into the bladder, no complications were noted. Urine return was noted 50 ml and urine was yellow in color. The balloon was filled with 10ml of sterile water. A night bag was attached for drainage.  Patient was given proper instruction on catheter care.    Performed by: Michiel Cowboy, PA-C   Follow up: Return in about 1 month (around 03/05/2023) for Foley exchange .  Gemtesa gave her headache, so I sent in oxybutynin IR 5 mg 3 times daily for bladder spasms

## 2023-02-02 ENCOUNTER — Ambulatory Visit (INDEPENDENT_AMBULATORY_CARE_PROVIDER_SITE_OTHER): Payer: Medicaid Other | Admitting: Urology

## 2023-02-02 ENCOUNTER — Encounter: Payer: Self-pay | Admitting: Urology

## 2023-02-02 VITALS — BP 135/83 | HR 79 | Wt 166.0 lb

## 2023-02-02 DIAGNOSIS — N3289 Other specified disorders of bladder: Secondary | ICD-10-CM

## 2023-02-02 DIAGNOSIS — R338 Other retention of urine: Secondary | ICD-10-CM

## 2023-02-02 MED ORDER — OXYBUTYNIN CHLORIDE 5 MG PO TABS: 5.0000 mg | ORAL_TABLET | Freq: Three times a day (TID) | ORAL | 0 refills | Status: DC

## 2023-02-11 ENCOUNTER — Emergency Department: Payer: Medicaid Other

## 2023-02-11 ENCOUNTER — Other Ambulatory Visit: Payer: Self-pay

## 2023-02-11 ENCOUNTER — Emergency Department
Admission: EM | Admit: 2023-02-11 | Discharge: 2023-02-11 | Disposition: A | Discharge status: Home / Self Care | Payer: Medicaid Other | Attending: Emergency Medicine | Admitting: Emergency Medicine

## 2023-02-11 DIAGNOSIS — R0602 Shortness of breath: Secondary | ICD-10-CM | POA: Diagnosis present

## 2023-02-11 DIAGNOSIS — I509 Heart failure, unspecified: Secondary | ICD-10-CM | POA: Diagnosis not present

## 2023-02-11 DIAGNOSIS — R791 Abnormal coagulation profile: Secondary | ICD-10-CM | POA: Diagnosis not present

## 2023-02-11 DIAGNOSIS — I110 Hypertensive heart disease with heart failure: Secondary | ICD-10-CM | POA: Insufficient documentation

## 2023-02-11 DIAGNOSIS — J441 Chronic obstructive pulmonary disease with (acute) exacerbation: Secondary | ICD-10-CM

## 2023-02-11 DIAGNOSIS — Z7951 Long term (current) use of inhaled steroids: Secondary | ICD-10-CM | POA: Diagnosis not present

## 2023-02-11 LAB — CBC WITH DIFFERENTIAL/PLATELET
Abs Immature Granulocytes: 0.03 10*3/uL (ref 0.00–0.07)
Basophils Absolute: 0 10*3/uL (ref 0.0–0.1)
Basophils Relative: 1 %
Eosinophils Absolute: 0.1 10*3/uL (ref 0.0–0.5)
Eosinophils Relative: 3 %
HCT: 37.7 % (ref 36.0–46.0)
Hemoglobin: 12.9 g/dL (ref 12.0–15.0)
Immature Granulocytes: 1 %
Lymphocytes Relative: 26 %
Lymphs Abs: 1.3 10*3/uL (ref 0.7–4.0)
MCH: 31.1 pg (ref 26.0–34.0)
MCHC: 34.2 g/dL (ref 30.0–36.0)
MCV: 90.8 fL (ref 80.0–100.0)
Monocytes Absolute: 0.6 10*3/uL (ref 0.1–1.0)
Monocytes Relative: 11 %
Neutro Abs: 3.1 10*3/uL (ref 1.7–7.7)
Neutrophils Relative %: 58 %
Platelets: 203 10*3/uL (ref 150–400)
RBC: 4.15 MIL/uL (ref 3.87–5.11)
RDW: 12.9 % (ref 11.5–15.5)
WBC: 5.2 10*3/uL (ref 4.0–10.5)
nRBC: 0 % (ref 0.0–0.2)

## 2023-02-11 LAB — COMPREHENSIVE METABOLIC PANEL
ALT: 23 U/L (ref 0–44)
AST: 18 U/L (ref 15–41)
Albumin: 2.8 g/dL — ABNORMAL LOW (ref 3.5–5.0)
Alkaline Phosphatase: 75 U/L (ref 38–126)
Anion gap: 8 (ref 5–15)
BUN: 6 mg/dL — ABNORMAL LOW (ref 8–23)
CO2: 29 mmol/L (ref 22–32)
Calcium: 8.7 mg/dL — ABNORMAL LOW (ref 8.9–10.3)
Chloride: 94 mmol/L — ABNORMAL LOW (ref 98–111)
Creatinine, Ser: 0.52 mg/dL (ref 0.44–1.00)
GFR, Estimated: >60 mL/min (ref >60)
Glucose, Bld: 112 mg/dL — ABNORMAL HIGH (ref 70–99)
Potassium: 4 mmol/L (ref 3.5–5.1)
Sodium: 131 mmol/L — ABNORMAL LOW (ref 135–145)
Total Bilirubin: 0.1 mg/dL — ABNORMAL LOW (ref 0.3–1.2)
Total Protein: 6.9 g/dL (ref 6.5–8.1)

## 2023-02-11 LAB — D-DIMER, QUANTITATIVE: D-Dimer, Quant: 0.84 ug{FEU}/mL — ABNORMAL HIGH (ref 0.00–0.50)

## 2023-02-11 LAB — BRAIN NATRIURETIC PEPTIDE: B Natriuretic Peptide: 56.4 pg/mL (ref 0.0–100.0)

## 2023-02-11 LAB — TROPONIN I (HIGH SENSITIVITY): Troponin I (High Sensitivity): 4 ng/L (ref <18)

## 2023-02-11 MED ORDER — IPRATROPIUM-ALBUTEROL 0.5-2.5 (3) MG/3ML IN SOLN
6.0000 mL | Freq: Once | RESPIRATORY_TRACT | Status: AC
  Administered 2023-02-11: 6 mL via RESPIRATORY_TRACT
  Filled 2023-02-11: qty 6

## 2023-02-11 MED ORDER — AMOXICILLIN-POT CLAVULANATE 875-125 MG PO TABS: 1.0000 | ORAL_TABLET | Freq: Two times a day (BID) | ORAL | 0 refills | Status: AC

## 2023-02-11 MED ORDER — PREDNISONE 20 MG PO TABS: 40.0000 mg | ORAL_TABLET | Freq: Every day | ORAL | 0 refills | Status: AC

## 2023-02-11 MED ORDER — IOHEXOL 350 MG/ML SOLN
75.0000 mL | Freq: Once | INTRAVENOUS | Status: AC | PRN
  Administered 2023-02-11: 75 mL via INTRAVENOUS

## 2023-02-11 NOTE — Discharge Instructions (Addendum)
You were seen in the emergency department today for evaluation of your shortness of breath.  I suspect that you may have a viral illness that flared your COPD.  Your testing was overall reassuring.  Your CT scan did show that you have prominent lymph nodes, please follow-up with your primary care doctor about this.  For your COPD flare, I sent a prescription for a steroid and antibiotic to your pharmacy.  Please take these as directed.  Return to the ER for new or worsening symptoms.  IMPRESSION:  1. No definite acute findings in the thorax to account for the  patient's symptoms. Specifically, no evidence of pulmonary embolism.  2. Extensive mediastinal and right hilar lymphadenopathy,  progressive compared to the prior study. Clinical correlation for  signs and symptoms of systemic disease such as sarcoidosis or  lymphoproliferative disorder is recommended.  3. Diffuse bronchial wall thickening with moderate centrilobular and  mild paraseptal emphysema; imaging findings suggestive of underlying  COPD.  4. Aortic atherosclerosis, in addition to left main and three-vessel  coronary artery disease. Please note that although the presence of  coronary artery calcium documents the presence of coronary artery  disease, the severity of this disease and any potential stenosis  cannot be assessed on this non-gated CT examination. Assessment for  potential risk factor modification, dietary therapy or pharmacologic  therapy may be warranted, if clinically indicated.    Aortic Atherosclerosis (ICD10-I70.0) and Emphysema (ICD10-J43.9).      Electronically Signed    By: Trudie Reed M.D.    On: 02/11/2023 09:59

## 2023-02-11 NOTE — ED Triage Notes (Signed)
Pt arrived via [facility] from home with c/o SOB x 4 days and productive cough with yellow sputum and chills per EMS. Pt denies fever. VSS, and pt is able to answer questions in full sentences.

## 2023-02-11 NOTE — ED Provider Notes (Signed)
[facility] Provider Note    Event Date/Time   First MD Initiated Contact with Patient 02/11/23 0809     (approximate)   History   Shortness of Breath and Cough   HPI  Katelyn [name] is a 62 y.o. female with history of HTN, CHF, chronic hyponatremia, COPD presenting to the emergency department for evaluation of shortness of breath.  Patient reports that over the past few days she has had shortness of breath with associated cough, EMS reported newly productive with yellow sputum, but patient tells me she is unsure if she has had a change in sputum.  Denies fevers.  Does report left-sided chest pain worse with deep breaths.  Additionally reports she has noted swelling in her legs, initially noticed it more on the right, but now notices in her bilateral legs, says this has happened previously.  Denies abdominal pain, vomiting, diarrhea, dysuria.  Does have chronic indwelling Foley that she reports is changed every month.       Physical Exam   Triage Vital Signs: ED Triage Vitals [02/11/23 0811]  Encounter Vitals Group     BP 130/82     Systolic BP Percentile      Diastolic BP Percentile      Pulse Rate 73     Resp (!) 21     Temp 97.6 F (36.4 C)     Temp Source Oral     SpO2 100 %     Weight      Height      Head Circumference      Peak Flow      Pain Score      Pain Loc      Pain Education      Exclude from Growth Chart     Most recent vital signs: Vitals:   02/11/23 1100 02/11/23 1130  BP: (!) 137/96 129/82  Pulse: 76 73  Resp: 17 19  Temp:  97.6 F (36.4 C)  SpO2: 100% 99%     General: Awake, interactive  CV:  Regular rate, good peripheral perfusion.  Resp:  Lung sounds coarse bilaterally with [place], though fair air movement without significant appreciable wheezing, respirations mildly labored Abd:  Soft, nondistended, nontender to palpation Neuro:  Symmetric facial movement, fluid speech   ED Results / Procedures /  Treatments   Labs (all labs ordered are listed, but only abnormal results are displayed) Labs Reviewed  COMPREHENSIVE METABOLIC PANEL - Abnormal; Notable for the following components:      Result Value   Sodium 131 (*)    Chloride 94 (*)    Glucose, Bld 112 (*)    BUN 6 (*)    Calcium 8.7 (*)    Albumin 2.8 (*)    Total Bilirubin 0.1 (*)    All other components within normal limits  D-DIMER, QUANTITATIVE - Abnormal; Notable for the following components:   D-Dimer, Quant 0.84 (*)    All other components within normal limits  CBC WITH DIFFERENTIAL/PLATELET  BRAIN NATRIURETIC PEPTIDE  TROPONIN I (HIGH SENSITIVITY)     EKG EKG independently reviewed interpreted by myself (ER attending) demonstrates: Sinus rhythm at a rate of 72, PR 181, QRS 88, QTc 417, nonspecific ST changes, low voltage in multiple leads   RADIOLOGY Imaging independently reviewed and interpreted by myself demonstrates:  CT of the chest without PE or other acute abnormality, radiology does note significant lymphadenopathy Chest x-Jaxsyn Catalfamo without focal consolidation   PROCEDURES:  Critical Care  performed: No  Procedures   MEDICATIONS ORDERED IN ED: Medications  ipratropium-albuterol (DUONEB) 0.5-2.5 (3) MG/3ML nebulizer solution 6 mL (6 mLs Nebulization Given 02/11/23 1013)  iohexol (OMNIPAQUE) 350 MG/ML injection 75 mL (75 mLs Intravenous Contrast Given 02/11/23 0929)     IMPRESSION / MDM / ASSESSMENT AND PLAN / ED COURSE  I reviewed the triage vital signs and the nursing notes.  Differential diagnosis includes, but is not limited to, COPD exacerbation, pneumonia, pulmonary embolism, CHF exacerbation, pneumothorax,  Patient's presentation is most consistent with acute presentation with potential threat to life or bodily function.  62 year old female presenting with chest pain and shortness of breath.  Vital signs stable on presentation, but with coarse lung sounds on exam.  Will obtain labs, EKG, x-Kal Chait  to further evaluate.  Labs notable for elevated D-dimer, CTA of the chest obtained without evidence of PE.  This does demonstrate prominent lymph nodes, but no acute pulmonary abnormality.  Patient reevaluated and updated on the results of her workup.  Suspect that she may have a viral illness that has flared her COPD.  She is comfortable with discharge home.  Do think this is reasonable.  With her sputum changed in the setting of COPD, will DC with prescription for prednisone and azithromycin.  Strict return precautions provided.  Patient instructed to follow-up as an outpatient for further evaluation of the lymphadenopathy noted on her CT.  Strict return precautions provided.  Patient discharged stable addition.       FINAL CLINICAL IMPRESSION(S) / ED DIAGNOSES   Final diagnoses:  Shortness of breath  COPD exacerbation (HCC)     Rx / DC Orders   ED Discharge Orders          Ordered    predniSONE (DELTASONE) 20 MG tablet  Daily with breakfast        02/11/23 1122    amoxicillin-clavulanate (AUGMENTIN) 875-125 MG tablet  2 times daily        02/11/23 1122             Note:  This document was prepared using Dragon voice recognition software and may include unintentional dictation errors.   Trinna Post, MD 02/11/23 [phone]

## 2023-03-08 NOTE — Progress Notes (Unsigned)
Cath Change/ Replacement  Patient is present today for a catheter change due to urinary retention.  8 ml of water was removed from the balloon, a 16 FR Silastic foley cath was removed without difficulty.  Patient was cleaned and prepped in a sterile fashion with betadine and 2% lidocaine jelly was instilled into the urethra. A 16 FR foley cath was replaced into the bladder, no complications were noted. Urine return was noted 50 ml and urine was yellow in color. The balloon was filled with 10ml of sterile water. A night bag was attached for drainage.  Patient was given proper instruction on catheter care.    Performed by: Michiel Cowboy, PA-C   Follow up: Return in about 1 month (around 04/08/2023) for Foley exchange .   She continues to have bladder spasms despite taking oxybutynin.  She was tried on [place], but it gave her headache.  I sent a prescription for Levsin to see if this would help with the spasms.  She is also complaining of a vaginal discharge.  So I sent in a prescription for Terazol cream

## 2023-03-09 ENCOUNTER — Encounter: Payer: Self-pay | Admitting: Urology

## 2023-03-09 ENCOUNTER — Ambulatory Visit (INDEPENDENT_AMBULATORY_CARE_PROVIDER_SITE_OTHER): Payer: Medicaid Other | Admitting: Urology

## 2023-03-09 VITALS — BP 143/93 | HR 84 | Ht 64.0 in | Wt 166.0 lb

## 2023-03-09 DIAGNOSIS — R339 Retention of urine, unspecified: Secondary | ICD-10-CM

## 2023-03-09 DIAGNOSIS — B3731 Acute candidiasis of vulva and vagina: Secondary | ICD-10-CM

## 2023-03-09 DIAGNOSIS — N3289 Other specified disorders of bladder: Secondary | ICD-10-CM

## 2023-03-09 MED ORDER — HYOSCYAMINE SULFATE 0.125 MG SL SUBL: 0.1250 mg | SUBLINGUAL_TABLET | SUBLINGUAL | 0 refills | Status: DC | PRN

## 2023-03-09 MED ORDER — TERCONAZOLE 0.8 % VA CREA
1.0000 | TOPICAL_CREAM | Freq: Every day | VAGINAL | 0 refills | Status: DC
Start: 2023-03-09 — End: 2023-05-25

## 2023-04-04 NOTE — Progress Notes (Unsigned)
Cath Change/ Replacement  Patient is present today for a catheter change due to urinary retention.  8 ml of water was removed from the balloon, a 16 FR foley cath was removed without difficulty.  Patient was cleaned and prepped in a sterile fashion.  A 16 FR foley cath was replaced into the bladder, no complications were noted. Urine return was noted 50 ml and urine was yellow in color. The balloon was filled with 10ml of sterile water. A night bag was attached for drainage.  Patient was given proper instruction on catheter care.    Performed by: Michiel Cowboy, PA-C   Follow up: Return in about 1 month (around 05/06/2023) for Foley exchange .

## 2023-04-05 ENCOUNTER — Ambulatory Visit: Payer: Medicaid Other | Admitting: Urology

## 2023-04-05 DIAGNOSIS — N3289 Other specified disorders of bladder: Secondary | ICD-10-CM

## 2023-04-05 DIAGNOSIS — R339 Retention of urine, unspecified: Secondary | ICD-10-CM

## 2023-04-05 MED ORDER — TROSPIUM CHLORIDE 20 MG PO TABS
20.0000 mg | ORAL_TABLET | Freq: Two times a day (BID) | ORAL | 0 refills | Status: DC
Start: 2023-04-05 — End: 2023-07-20

## 2023-04-27 ENCOUNTER — Encounter: Payer: Self-pay | Admitting: Nurse Practitioner

## 2023-04-27 ENCOUNTER — Ambulatory Visit (INDEPENDENT_AMBULATORY_CARE_PROVIDER_SITE_OTHER): Payer: Medicaid Other | Admitting: Nurse Practitioner

## 2023-04-27 VITALS — BP 158/100 | HR 73 | Temp 97.9°F | Ht 64.0 in | Wt 172.4 lb

## 2023-04-27 DIAGNOSIS — I10 Essential (primary) hypertension: Secondary | ICD-10-CM

## 2023-04-27 DIAGNOSIS — E663 Overweight: Secondary | ICD-10-CM | POA: Diagnosis not present

## 2023-04-27 DIAGNOSIS — J449 Chronic obstructive pulmonary disease, unspecified: Secondary | ICD-10-CM | POA: Diagnosis not present

## 2023-04-27 DIAGNOSIS — E785 Hyperlipidemia, unspecified: Secondary | ICD-10-CM

## 2023-04-27 DIAGNOSIS — G43009 Migraine without aura, not intractable, without status migrainosus: Secondary | ICD-10-CM | POA: Diagnosis not present

## 2023-04-27 DIAGNOSIS — Z122 Encounter for screening for malignant neoplasm of respiratory organs: Secondary | ICD-10-CM

## 2023-04-27 DIAGNOSIS — F419 Anxiety disorder, unspecified: Secondary | ICD-10-CM

## 2023-04-27 DIAGNOSIS — F32A Depression, unspecified: Secondary | ICD-10-CM

## 2023-04-27 DIAGNOSIS — Z1231 Encounter for screening mammogram for malignant neoplasm of breast: Secondary | ICD-10-CM

## 2023-04-27 DIAGNOSIS — Z1159 Encounter for screening for other viral diseases: Secondary | ICD-10-CM

## 2023-04-27 DIAGNOSIS — K219 Gastro-esophageal reflux disease without esophagitis: Secondary | ICD-10-CM

## 2023-04-27 DIAGNOSIS — Z13820 Encounter for screening for osteoporosis: Secondary | ICD-10-CM

## 2023-04-27 MED ORDER — FLUTICASONE-SALMETEROL 250-50 MCG/ACT IN AEPB
1.0000 | INHALATION_SPRAY | Freq: Two times a day (BID) | RESPIRATORY_TRACT | 1 refills | Status: DC
Start: 2023-04-27 — End: 2023-05-25

## 2023-04-27 MED ORDER — BUTALBITAL-APAP-CAFFEINE 50-325-40 MG PO TABS
1.0000 | ORAL_TABLET | Freq: Every day | ORAL | 0 refills | Status: DC | PRN
Start: 2023-04-27 — End: 2023-05-12

## 2023-04-27 MED ORDER — RIZATRIPTAN BENZOATE 5 MG PO TABS
5.0000 mg | ORAL_TABLET | ORAL | 0 refills | Status: DC | PRN
Start: 2023-04-27 — End: 2023-05-25

## 2023-04-27 NOTE — Assessment & Plan Note (Signed)
History of the same pending nonfasting lipid panel.  Been approximately 4 hours this patient had something to eat

## 2023-04-27 NOTE — Progress Notes (Signed)
New Patient Office Visit  Subjective    Patient ID: Katelyn [name], female    DOB: 08/01/1960  Age: 62 y.o. MRN: [id number]  CC:  Chief Complaint  Patient presents with   Establish Care    Pt would like blood work and flu shot.     HPI [name] presents to establish care   HTN: amlodipine, hydrochlorothiazide, meteprolol. Statse that she does have a cuff but does not check it at home  CHF: This is diagnosis in the chart patient denies diagnosis  Neuropathy: Patient takes 300 mg gabapentin 3 times daily.  States is not very effective  COPD: state that she is on flovent daily and albuterol twice a day.  GERD: states that she is nexium and does not need breakthrough medicaoitn often. States Tums approx a couple times a week. States that she will try and avoid spicy and greasy   Bladder spasms: states that she followed by urology monthly. States that she is bladder spasm currently on medication and indwelling catheter in office  Anxiety/depression:serqouel 100mg  at bedtime.  Patient also maintained on alprazolam 1 mg 3 times daily discussed this is a high dose and plan to wean down  Migraines: states that she tradionally gets them approx at least monthly. States the front of the head all across the head. Described as it just hurt. Light and sound sensitivy. With some nausea. Has use tylenol   Tdap: 2021 Flu: Get at local pharmacy Covid: Get at local pharmacy Pna: Get at local pharmacy  Colonosocopy: cologurad 06/2021 Mammogram: Delford Field order placed today Pap semar: Status post total hysterectomy Dexa: Ordered today at [facility]  Outpatient Encounter Medications as of 04/27/2023  Medication Sig   ALPRAZolam (XANAX) 1 MG tablet Take by mouth.   amLODipine (NORVASC) 10 MG tablet Take 1 tablet (10 mg total) by mouth daily.   atorvastatin (LIPITOR) 40 MG tablet Take 40 mg by mouth daily.   butalbital-acetaminophen-caffeine (FIORICET) 50-325-40 MG tablet Take 1 tablet  by mouth daily as needed for headache or migraine.   clindamycin (CLEOCIN) 300 MG capsule Take 300 mg by mouth every 6 (six) hours.   cloNIDine (CATAPRES) 0.1 MG tablet Take 0.1 mg by mouth 2 (two) times daily.   dicyclomine (BENTYL) 10 MG capsule Take 10 mg by mouth 3 (three) times daily.   esomeprazole (NEXIUM) 40 MG capsule Take 40 mg by mouth daily at 12 noon.   FLOVENT HFA 220 MCG/ACT inhaler Inhale 2 puffs into the lungs 2 (two) times daily.   gabapentin (NEURONTIN) 300 MG capsule Take 1 capsule (300 mg total) by mouth 3 (three) times daily as needed (Pain).   hydrochlorothiazide (MICROZIDE) 12.5 MG capsule Take 12.5 mg by mouth daily.   metoprolol succinate (TOPROL-XL) 100 MG 24 hr tablet Take 100 mg by mouth daily.   QUEtiapine (SEROQUEL) 100 MG tablet Take 100 mg by mouth 2 (two) times daily.   Vitamin D, Ergocalciferol, (DRISDOL) 1.25 MG (50000 UNIT) CAPS capsule Take 50,000 Units by mouth once a week. Monday   albuterol (VENTOLIN HFA) 108 (90 Base) MCG/ACT inhaler Inhale 2 puffs into the lungs every 6 (six) hours as needed for wheezing or shortness of breath. (Patient not taking: Reported on 04/27/2023)   hyoscyamine (LEVSIN/SL) 0.125 MG SL tablet Place 1 tablet (0.125 mg total) under the tongue every 4 (four) hours as needed. (Patient not taking: Reported on 04/27/2023)   terconazole (TERAZOL 3) 0.8 % vaginal cream Place 1 applicator vaginally at bedtime. (  Patient not taking: Reported on 04/27/2023)   trospium (SANCTURA) 20 MG tablet Take 1 tablet (20 mg total) by mouth 2 (two) times daily. (Patient not taking: Reported on 04/27/2023)   [DISCONTINUED] amoxicillin (AMOXIL) 500 MG capsule Take 500 mg by mouth every 8 (eight) hours. (Patient not taking: Reported on 04/27/2023)   No facility-administered encounter medications on file as of 04/27/2023.    Past Medical History:  Diagnosis Date   Anxiety    COPD (chronic obstructive pulmonary disease) (HCC)    Depressive disorder    Elevated  troponin 07/04/2022   GERD (gastroesophageal reflux disease)    Hiatal hernia    HLD (hyperlipidemia)    Hypertension    Hypomagnesemia 06/21/2022   Hypotension 07/02/2022   IBS (irritable bowel syndrome)    IC (interstitial cystitis)    Migraine    Nicotine dependence    Nontoxic goiter    Overactive bladder    Smoking     Past Surgical History:  Procedure Laterality Date   ABDOMINAL HYSTERECTOMY     ADENOIDECTOMY     CHOLECYSTECTOMY     HERNIA REPAIR     NECK SURGERY     SHOULDER SURGERY     TONSILLECTOMY      Family History  Problem Relation Age of Onset   CAD Father        onset age 32   Heart disease Father    Kidney disease Father    Prostate cancer Father    Colon polyps Maternal Grandmother    Colon cancer Paternal Grandmother    Bladder Cancer Neg Hx    Kidney cancer Neg Hx     Social History   Socioeconomic History   Marital status: Divorced    Spouse name: Not on file   Number of children: 3   Years of education: Not on file   Highest education level: Not on file  Occupational History    Comment: Disability  Tobacco Use   Smoking status: Every Day    Current packs/day: 0.50    Types: Cigarettes   Smokeless tobacco: Never  Substance and Sexual Activity   Alcohol use: No   Drug use: No   Sexual activity: Yes    Birth control/protection: Surgical  Other Topics Concern   Not on file  Social History Narrative   Not on file   Social Determinants of Health   Financial Resource Strain: Not on file  Food Insecurity: No Food Insecurity (06/22/2022)   Hunger Vital Sign    Worried About Running Out of Food in the Last Year: Never true    Ran Out of Food in the Last Year: Never true  Transportation Needs: No Transportation Needs (06/22/2022)   PRAPARE - Administrator, Civil Service (Medical): No    Lack of Transportation (Non-Medical): No  Physical Activity: Not on file  Stress: Not on file  Social Connections: Not on file  Intimate  Partner Violence: Not At Risk (06/22/2022)   Humiliation, Afraid, Rape, and Kick questionnaire    Fear of Current or Ex-Partner: No    Emotionally Abused: No    Physically Abused: No    Sexually Abused: No    Review of Systems  Constitutional:  Positive for chills and fever.  Respiratory:  Negative for shortness of breath.   Cardiovascular:  Negative for chest pain and leg swelling.  Gastrointestinal:  Negative for abdominal pain, blood in stool, constipation, diarrhea, nausea and vomiting.  BM daily   Genitourinary:  Negative for dysuria and hematuria.  Neurological:  Negative for tingling and headaches.  Psychiatric/Behavioral:  Negative for hallucinations and suicidal ideas.         Objective    BP (!) 156/100   Pulse 73   Temp 97.9 F (36.6 C) (Oral)   Ht 5\' 4"  (1.626 m)   Wt 172 lb 6.4 oz (78.2 kg)   SpO2 97%   BMI 29.59 kg/m   Physical Exam Vitals and nursing note reviewed.  Constitutional:      Appearance: Normal appearance.  HENT:     Right Ear: Tympanic membrane, ear canal and external ear normal.     Left Ear: Tympanic membrane, ear canal and external ear normal.     Mouth/Throat:     Mouth: Mucous membranes are moist.     Pharynx: Oropharynx is clear.  Cardiovascular:     Rate and Rhythm: Normal rate and regular rhythm.     Heart sounds: Normal heart sounds.  Pulmonary:     Effort: Pulmonary effort is normal.     Comments: Globally decreased Abdominal:     General: Bowel sounds are normal.  Musculoskeletal:     Right lower leg: No edema.     Left lower leg: No edema.  Lymphadenopathy:     Cervical: No cervical adenopathy.  Neurological:     Mental Status: She is alert.         Assessment & Plan:   Problem List Items Addressed This Visit   None Visit Diagnoses     Encounter for hepatitis C screening test for low risk patient    -  Primary       No follow-ups on file.   Audria Nine, NP

## 2023-04-27 NOTE — Assessment & Plan Note (Signed)
History of the same worse currently as patient is doing with a dental infection followed by dentistry.  States that she has tried medications in the past that made her chest feel funny she thinks is called Imitrex could not locate in the chart we will try rizatriptan.  Patient was adamant that she needs some of her Fioricet gave her a short supply but she needs to try triptan also

## 2023-04-27 NOTE — Assessment & Plan Note (Signed)
History of the same.  Patient currently maintained on Nexium continue Nexium as prescribed try to avoid over-the-counter antacids if possible

## 2023-04-27 NOTE — Patient Instructions (Signed)
Nice to see you today Get your flu shot at the pharmacy  Follow up with me in 6 weeks

## 2023-04-27 NOTE — Assessment & Plan Note (Signed)
Patient currently maintained on alprazolam 1 mg 3 times daily plan to taper down on that dosage.  Patient also on Seroquel 100 mg nightly.  If patient has metabolic dysfunction consider lowering that she states is only used for sleep.  Can consider using an SSRI or atypical like buspirone for anxiety as we reduce the alprazolam use.  Did review possible side effects of memory impairment and falls with long-term use especially as she ages

## 2023-04-27 NOTE — Assessment & Plan Note (Signed)
Diagnosis of COPD but was on ICS and albuterol inhaler.  Discontinue ICS to Wixela 1 puff twice daily rinse mouth after use continue ProAir as needed patient is using it twice a day currently

## 2023-04-27 NOTE — Assessment & Plan Note (Signed)
Patient on amlodipine, clonidine, metoprolol 50mg  BID, hydrochlorothiazide.  Blood pressure above goal today.  Patient is in pain.  Blood pressure above goal upon recheck follow-up in 6 weeks before making medication changes

## 2023-04-27 NOTE — Progress Notes (Signed)
New Patient Office Visit  Subjective    Patient ID: Katelyn [name], female    DOB: 10/30/1960  Age: 62 y.o. MRN: [id number]  CC:  Chief Complaint  Patient presents with   Establish Care    Pt would like blood work.    HPI Katelyn [name] presents to establish care   HTN: amlodipine, hydrochlorothiazide, meteprolol. Statse that she does have a cuff but does not check it at home  CHF: This is diagnosis in the chart patient denies diagnosis  Neuropathy: Patient takes 300 mg gabapentin 3 times daily.  States is not very effective  COPD: state that she is on flovent daily and albuterol twice a day.  GERD: states that she is nexium and does not need breakthrough medicaoitn often. States Tums approx a couple times a week. States that she will try and avoid spicy and greasy   Bladder spasms: states that she followed by urology monthly. States that she is bladder spasm currently on medication and indwelling catheter in office  Anxiety/depression:serqouel 100mg  at bedtime.  Patient also maintained on alprazolam 1 mg 3 times daily discussed this is a high dose and plan to wean down  Migraines: states that she tradionally gets them approx at least monthly. States the front of the head all across the head. Described as it just hurt. Light and sound sensitivy. With some nausea. Has use tylenol   Tdap: 2021 Flu: Get at local pharmacy Covid: Get at local pharmacy Pna: Get at local pharmacy  Colonosocopy: cologurad 06/2021 Mammogram: Delford Field order placed today Pap semar: Status post total hysterectomy Dexa: Ordered today at [facility]  Outpatient Encounter Medications as of 04/27/2023  Medication Sig   ALPRAZolam (XANAX) 1 MG tablet Take by mouth.   amLODipine (NORVASC) 10 MG tablet Take 1 tablet (10 mg total) by mouth daily.   atorvastatin (LIPITOR) 40 MG tablet Take 40 mg by mouth daily.   clindamycin (CLEOCIN) 300 MG capsule Take 300 mg by mouth every 6 (six) hours.   cloNIDine  (CATAPRES) 0.1 MG tablet Take 0.1 mg by mouth 2 (two) times daily.   dicyclomine (BENTYL) 10 MG capsule Take 10 mg by mouth 3 (three) times daily.   esomeprazole (NEXIUM) 40 MG capsule Take 40 mg by mouth daily at 12 noon.   fluticasone-salmeterol (WIXELA INHUB) 250-50 MCG/ACT AEPB Inhale 1 puff into the lungs in the morning and at bedtime.   gabapentin (NEURONTIN) 300 MG capsule Take 1 capsule (300 mg total) by mouth 3 (three) times daily as needed (Pain).   hydrochlorothiazide (MICROZIDE) 12.5 MG capsule Take 12.5 mg by mouth daily.   metoprolol succinate (TOPROL-XL) 100 MG 24 hr tablet Take 100 mg by mouth daily.   QUEtiapine (SEROQUEL) 100 MG tablet Take 100 mg by mouth 2 (two) times daily.   rizatriptan (MAXALT) 5 MG tablet Take 1 tablet (5 mg total) by mouth as needed for migraine. May repeat in 2 hours if needed   Vitamin D, Ergocalciferol, (DRISDOL) 1.25 MG (50000 UNIT) CAPS capsule Take 50,000 Units by mouth once a week. Monday   [DISCONTINUED] butalbital-acetaminophen-caffeine (FIORICET) 50-325-40 MG tablet Take 1 tablet by mouth daily as needed for headache or migraine.   [DISCONTINUED] FLOVENT HFA 220 MCG/ACT inhaler Inhale 2 puffs into the lungs 2 (two) times daily.   albuterol (VENTOLIN HFA) 108 (90 Base) MCG/ACT inhaler Inhale 2 puffs into the lungs every 6 (six) hours as needed for wheezing or shortness of breath. (Patient not taking: Reported on 04/27/2023)  butalbital-acetaminophen-caffeine (FIORICET) 50-325-40 MG tablet Take 1 tablet by mouth daily as needed for headache or migraine.   hyoscyamine (LEVSIN/SL) 0.125 MG SL tablet Place 1 tablet (0.125 mg total) under the tongue every 4 (four) hours as needed. (Patient not taking: Reported on 04/27/2023)   terconazole (TERAZOL 3) 0.8 % vaginal cream Place 1 applicator vaginally at bedtime. (Patient not taking: Reported on 04/27/2023)   trospium (SANCTURA) 20 MG tablet Take 1 tablet (20 mg total) by mouth 2 (two) times daily. (Patient  not taking: Reported on 04/27/2023)   [DISCONTINUED] amoxicillin (AMOXIL) 500 MG capsule Take 500 mg by mouth every 8 (eight) hours. (Patient not taking: Reported on 04/27/2023)   No facility-administered encounter medications on file as of 04/27/2023.    Past Medical History:  Diagnosis Date   Anxiety    COPD (chronic obstructive pulmonary disease) (HCC)    Depressive disorder    Elevated troponin 07/04/2022   GERD (gastroesophageal reflux disease)    Hiatal hernia    HLD (hyperlipidemia)    Hypertension    Hypomagnesemia 06/21/2022   Hypotension 07/02/2022   IBS (irritable bowel syndrome)    IC (interstitial cystitis)    Migraine    Nicotine dependence    Nontoxic goiter    Overactive bladder    Smoking     Past Surgical History:  Procedure Laterality Date   ABDOMINAL HYSTERECTOMY     ADENOIDECTOMY     CHOLECYSTECTOMY     HERNIA REPAIR     NECK SURGERY     SHOULDER SURGERY     TONSILLECTOMY      Family History  Problem Relation Age of Onset   CAD Father        onset age 89   Heart disease Father    Kidney disease Father    Prostate cancer Father    Colon polyps Maternal Grandmother    Colon cancer Paternal Grandmother    Bladder Cancer Neg Hx    Kidney cancer Neg Hx     Social History   Socioeconomic History   Marital status: Divorced    Spouse name: Not on file   Number of children: 3   Years of education: Not on file   Highest education level: Not on file  Occupational History    Comment: Disability  Tobacco Use   Smoking status: Every Day    Current packs/day: 0.50    Average packs/day: 0.5 packs/day for 40.0 years (20.0 ttl pk-yrs)    Types: Cigarettes    Start date: 04/27/1983   Smokeless tobacco: Never  Vaping Use   Vaping status: Never Used  Substance and Sexual Activity   Alcohol use: No   Drug use: No   Sexual activity: Yes    Birth control/protection: Surgical  Other Topics Concern   Not on file  Social History Narrative   Disabled    Social Determinants of Health   Financial Resource Strain: Not on file  Food Insecurity: No Food Insecurity (06/22/2022)   Hunger Vital Sign    Worried About Running Out of Food in the Last Year: Never true    Ran Out of Food in the Last Year: Never true  Transportation Needs: No Transportation Needs (06/22/2022)   PRAPARE - Administrator, Civil Service (Medical): No    Lack of Transportation (Non-Medical): No  Physical Activity: Not on file  Stress: Not on file  Social Connections: Not on file  Intimate Partner Violence: Not At Risk (06/22/2022)  Humiliation, Afraid, Rape, and Kick questionnaire    Fear of Current or Ex-Partner: No    Emotionally Abused: No    Physically Abused: No    Sexually Abused: No    Review of Systems  Constitutional:  Positive for chills and fever.  Respiratory:  Negative for shortness of breath.   Cardiovascular:  Negative for chest pain and leg swelling.  Gastrointestinal:  Negative for abdominal pain, blood in stool, constipation, diarrhea, nausea and vomiting.       BM daily   Genitourinary:  Negative for dysuria and hematuria.  Neurological:  Negative for tingling and headaches.  Psychiatric/Behavioral:  Negative for hallucinations and suicidal ideas.         Objective    BP (!) 158/100   Pulse 73   Temp 97.9 F (36.6 C) (Oral)   Ht 5\' 4"  (1.626 m)   Wt 172 lb 6.4 oz (78.2 kg)   SpO2 97%   BMI 29.59 kg/m   Physical Exam Vitals and nursing note reviewed.  Constitutional:      Appearance: Normal appearance.  HENT:     Right Ear: Tympanic membrane, ear canal and external ear normal.     Left Ear: Tympanic membrane, ear canal and external ear normal.     Mouth/Throat:     Mouth: Mucous membranes are moist.     Pharynx: Oropharynx is clear.  Cardiovascular:     Rate and Rhythm: Normal rate and regular rhythm.     Heart sounds: Normal heart sounds.  Pulmonary:     Effort: Pulmonary effort is normal.     Comments:  Globally decreased Abdominal:     General: Bowel sounds are normal.  Musculoskeletal:     Right lower leg: No edema.     Left lower leg: No edema.  Lymphadenopathy:     Cervical: No cervical adenopathy.  Neurological:     Mental Status: She is alert.         Assessment & Plan:   Problem List Items Addressed This Visit       Cardiovascular and Mediastinum   Essential hypertension    Patient on amlodipine, clonidine, metoprolol 50mg  BID, hydrochlorothiazide.  Blood pressure above goal today.  Patient is in pain.  Blood pressure above goal upon recheck follow-up in 6 weeks before making medication changes      Relevant Orders   CBC   Comprehensive metabolic panel   TSH   Migraine without aura and without status migrainosus, not intractable    History of the same worse currently as patient is doing with a dental infection followed by dentistry.  States that she has tried medications in the past that made her chest feel funny she thinks is called Imitrex could not locate in the chart we will try rizatriptan.  Patient was adamant that she needs some of her Fioricet gave her a short supply but she needs to try triptan also      Relevant Medications   rizatriptan (MAXALT) 5 MG tablet   butalbital-acetaminophen-caffeine (FIORICET) 50-325-40 MG tablet     Respiratory   COPD (chronic obstructive pulmonary disease) (HCC)    Diagnosis of COPD but was on ICS and albuterol inhaler.  Discontinue ICS to Wixela 1 puff twice daily rinse mouth after use continue ProAir as needed patient is using it twice a day currently      Relevant Medications   fluticasone-salmeterol (WIXELA INHUB) 250-50 MCG/ACT AEPB     Digestive   GERD without esophagitis  History of the same.  Patient currently maintained on Nexium continue Nexium as prescribed try to avoid over-the-counter antacids if possible        Other   Hyperlipidemia    History of the same pending nonfasting lipid panel.  Been  approximately 4 hours this patient had something to eat      Relevant Orders   Lipid panel   Anxiety and depression    Patient currently maintained on alprazolam 1 mg 3 times daily plan to taper down on that dosage.  Patient also on Seroquel 100 mg nightly.  If patient has metabolic dysfunction consider lowering that she states is only used for sleep.  Can consider using an SSRI or atypical like buspirone for anxiety as we reduce the alprazolam use.  Did review possible side effects of memory impairment and falls with long-term use especially as she ages      Overweight    Pending TSH, lipid panel, A1c.      Relevant Orders   Hemoglobin A1c   Other Visit Diagnoses     Encounter for hepatitis C screening test for low risk patient    -  Primary   Relevant Orders   Hepatitis C Antibody   Screening for lung cancer       Relevant Orders   Ambulatory Referral Lung Cancer Screening [facility]   Screening mammogram for breast cancer       Relevant Orders   MM 3D SCREENING MAMMOGRAM BILATERAL BREAST   Screening for osteoporosis       Relevant Orders   DG Bone Density       Return in about 6 weeks (around 06/08/2023) for COPD/Migraine recheck .   Audria Nine, NP

## 2023-04-27 NOTE — Assessment & Plan Note (Signed)
Pending TSH, lipid panel, A1c.

## 2023-04-28 LAB — CBC
HCT: 40.2 % (ref 36.0–46.0)
Hemoglobin: 13.8 g/dL (ref 12.0–15.0)
MCHC: 34.4 g/dL (ref 30.0–36.0)
MCV: 91.3 fL (ref 78.0–100.0)
Platelets: 258 10*3/uL (ref 150.0–400.0)
RBC: 4.4 Mil/uL (ref 3.87–5.11)
RDW: 13.4 % (ref 11.5–15.5)
WBC: 6.8 10*3/uL (ref 4.0–10.5)

## 2023-04-28 LAB — COMPREHENSIVE METABOLIC PANEL
ALT: 19 U/L (ref 0–35)
AST: 23 U/L (ref 0–37)
Albumin: 4 g/dL (ref 3.5–5.2)
Alkaline Phosphatase: 90 U/L (ref 39–117)
BUN: 4 mg/dL — ABNORMAL LOW (ref 6–23)
CO2: 30 meq/L (ref 19–32)
Calcium: 8.9 mg/dL (ref 8.4–10.5)
Chloride: 89 meq/L — ABNORMAL LOW (ref 96–112)
Creatinine, Ser: 0.6 mg/dL (ref 0.40–1.20)
GFR: 95.47 mL/min (ref >60.00)
Glucose, Bld: 104 mg/dL — ABNORMAL HIGH (ref 70–99)
Potassium: 3.7 meq/L (ref 3.5–5.1)
Sodium: 125 meq/L — ABNORMAL LOW (ref 135–145)
Total Bilirubin: 0.4 mg/dL (ref 0.2–1.2)
Total Protein: 7.4 g/dL (ref 6.0–8.3)

## 2023-04-28 LAB — LIPID PANEL
Cholesterol: 168 mg/dL (ref 0–200)
HDL: 48.6 mg/dL (ref >39.00)
LDL Cholesterol: 78 mg/dL (ref 0–99)
NonHDL: 119
Total CHOL/HDL Ratio: 3
Triglycerides: 206 mg/dL — ABNORMAL HIGH (ref 0.0–149.0)
VLDL: 41.2 mg/dL — ABNORMAL HIGH (ref 0.0–40.0)

## 2023-04-28 LAB — TSH: TSH: 0.98 u[IU]/mL (ref 0.35–5.50)

## 2023-04-28 LAB — HEPATITIS C ANTIBODY: Hepatitis C Ab: NONREACTIVE

## 2023-04-28 LAB — HEMOGLOBIN A1C: Hgb A1c MFr Bld: 5.5 % (ref 4.6–6.5)

## 2023-05-01 ENCOUNTER — Other Ambulatory Visit: Payer: Self-pay | Admitting: Nurse Practitioner

## 2023-05-01 ENCOUNTER — Telehealth: Payer: Self-pay | Admitting: Nurse Practitioner

## 2023-05-01 DIAGNOSIS — E871 Hypo-osmolality and hyponatremia: Secondary | ICD-10-CM

## 2023-05-01 DIAGNOSIS — G43009 Migraine without aura, not intractable, without status migrainosus: Secondary | ICD-10-CM

## 2023-05-01 NOTE — Addendum Note (Signed)
Addended by: Lovena Neighbours on: 05/01/2023 09:39 AM   Modules accepted: Orders

## 2023-05-01 NOTE — Telephone Encounter (Signed)
Patient returned call regarding labs, requested a call back when able

## 2023-05-02 NOTE — Telephone Encounter (Signed)
Contacted pharmacy regarding Spriva medication. Pharmacy staff stated that they have 1.25 inhaler only. No capsules and no 2.5 inhaler.

## 2023-05-02 NOTE — Telephone Encounter (Signed)
PCP responded to pt questions regarding lab results via MyChart. See other encounter.

## 2023-05-02 NOTE — Telephone Encounter (Signed)
Can we call and see what the pharmacy has to say in regards to patients Spiriva please

## 2023-05-04 ENCOUNTER — Ambulatory Visit: Payer: Medicaid Other | Admitting: Urology

## 2023-05-12 MED ORDER — BUTALBITAL-APAP-CAFFEINE 50-325-40 MG PO TABS
1.0000 | ORAL_TABLET | Freq: Every day | ORAL | 0 refills | Status: DC | PRN
Start: 2023-05-12 — End: 2023-05-24

## 2023-05-12 NOTE — Addendum Note (Signed)
Addended by: Eden Emms on: 05/12/2023 07:49 AM   Modules accepted: Orders

## 2023-05-21 ENCOUNTER — Other Ambulatory Visit: Payer: Self-pay | Admitting: Nurse Practitioner

## 2023-05-21 DIAGNOSIS — G43009 Migraine without aura, not intractable, without status migrainosus: Secondary | ICD-10-CM

## 2023-05-22 NOTE — Progress Notes (Unsigned)
Cath Change/ Replacement  Patient is present today for a catheter change due to urinary retention.  8 ml of water was removed from the balloon, a 16 FR foley cath was removed without difficulty.  Patient was cleaned and prepped in a sterile fashion with betadine.  A 16 FR Silastic foley cath was replaced into the bladder, no complications were noted. Urine return was noted 50 ml and urine was yellow in color. The balloon was filled with 10ml of sterile water. A night bag was attached for drainage.  Patient was given proper instruction on catheter care.    She does not want to learn how to self cath, she does not want to attempt another voiding trial and she states that when the weather gets warmer she is going to go to [facility] for her urodynamic study  Performed by: Michiel Cowboy, PA-C   Follow up: Return in about 1 month (around 06/25/2023) for Foley exchange .

## 2023-05-24 MED ORDER — BUTALBITAL-APAP-CAFFEINE 50-325-40 MG PO TABS
1.0000 | ORAL_TABLET | Freq: Every day | ORAL | 0 refills | Status: DC | PRN
Start: 2023-05-24 — End: 2023-06-27

## 2023-05-24 NOTE — Addendum Note (Signed)
Addended by: Eden Emms on: 05/24/2023 04:01 PM   Modules accepted: Orders

## 2023-05-25 ENCOUNTER — Ambulatory Visit (INDEPENDENT_AMBULATORY_CARE_PROVIDER_SITE_OTHER): Payer: Medicaid Other | Admitting: Urology

## 2023-05-25 VITALS — BP 150/90 | HR 76

## 2023-05-25 DIAGNOSIS — R338 Other retention of urine: Secondary | ICD-10-CM | POA: Diagnosis not present

## 2023-06-05 ENCOUNTER — Other Ambulatory Visit: Payer: Self-pay | Admitting: Nurse Practitioner

## 2023-06-05 DIAGNOSIS — G43009 Migraine without aura, not intractable, without status migrainosus: Secondary | ICD-10-CM

## 2023-06-05 NOTE — Telephone Encounter (Signed)
Called patient she is requesting refill. States she has about 6 left. States that she takes 2 each time she feels headache. She could not tell me how often just that she "doesn't go over her limit".

## 2023-06-08 ENCOUNTER — Ambulatory Visit: Payer: Medicaid Other | Admitting: Nurse Practitioner

## 2023-06-22 ENCOUNTER — Ambulatory Visit: Payer: Medicaid Other | Admitting: Physician Assistant

## 2023-06-23 ENCOUNTER — Ambulatory Visit: Payer: Medicaid Other | Admitting: Physician Assistant

## 2023-06-27 ENCOUNTER — Ambulatory Visit: Payer: Medicaid Other | Admitting: Family Medicine

## 2023-06-27 ENCOUNTER — Encounter: Payer: Self-pay | Admitting: Family Medicine

## 2023-06-27 VITALS — BP 150/84 | HR 88 | Temp 97.7°F | Resp 18 | Ht 64.0 in | Wt 173.0 lb

## 2023-06-27 DIAGNOSIS — G43009 Migraine without aura, not intractable, without status migrainosus: Secondary | ICD-10-CM | POA: Diagnosis not present

## 2023-06-27 DIAGNOSIS — R590 Localized enlarged lymph nodes: Secondary | ICD-10-CM

## 2023-06-27 DIAGNOSIS — I10 Essential (primary) hypertension: Secondary | ICD-10-CM | POA: Diagnosis not present

## 2023-06-27 DIAGNOSIS — Z1239 Encounter for other screening for malignant neoplasm of breast: Secondary | ICD-10-CM | POA: Insufficient documentation

## 2023-06-27 DIAGNOSIS — Z1231 Encounter for screening mammogram for malignant neoplasm of breast: Secondary | ICD-10-CM

## 2023-06-27 DIAGNOSIS — Z13820 Encounter for screening for osteoporosis: Secondary | ICD-10-CM | POA: Insufficient documentation

## 2023-06-27 DIAGNOSIS — Z1211 Encounter for screening for malignant neoplasm of colon: Secondary | ICD-10-CM

## 2023-06-27 DIAGNOSIS — I270 Primary pulmonary hypertension: Secondary | ICD-10-CM

## 2023-06-27 MED ORDER — ALBUTEROL SULFATE HFA 108 (90 BASE) MCG/ACT IN AERS: 2.0000 | INHALATION_SPRAY | Freq: Four times a day (QID) | RESPIRATORY_TRACT | 2 refills | Status: DC | PRN

## 2023-06-27 MED ORDER — BUTALBITAL-APAP-CAFFEINE 50-325-40 MG PO TABS
1.0000 | ORAL_TABLET | Freq: Four times a day (QID) | ORAL | 1 refills | Status: DC | PRN
Start: 2023-06-27 — End: 2023-10-20

## 2023-06-27 MED ORDER — HYDROCHLOROTHIAZIDE 25 MG PO TABS: 25.0000 mg | ORAL_TABLET | Freq: Every day | ORAL | 3 refills | Status: DC

## 2023-06-27 MED ORDER — ALPRAZOLAM 1 MG PO TABS: 1.0000 mg | ORAL_TABLET | Freq: Three times a day (TID) | ORAL | 3 refills | Status: DC

## 2023-06-27 MED ORDER — BUPROPION HCL ER (SR) 150 MG PO TB12: 150.0000 mg | ORAL_TABLET | Freq: Two times a day (BID) | ORAL | 3 refills | Status: DC

## 2023-06-27 MED ORDER — FLUTICASONE PROPIONATE HFA 220 MCG/ACT IN AERO: 1.0000 | INHALATION_SPRAY | Freq: Two times a day (BID) | RESPIRATORY_TRACT | 12 refills | Status: DC

## 2023-06-27 NOTE — Assessment & Plan Note (Signed)
 Blood pressure inadequate and adequately controlled on amlodipine 10 mg HCTZ 12.5 metoprolol succinate 100 mg and clonidine 0.1 daily.  Increase HCTZ to 25 mg.  Follow-up in a month to recheck blood pressure and labs.

## 2023-06-27 NOTE — Progress Notes (Signed)
 Established Patient Office Visit  Subjective   Patient ID: Katelyn [name], female    DOB: 1960/06/23  Age: 63 y.o. MRN: [id number]  Chief Complaint  Patient presents with   Medical Management of Chronic Issues    HPI She is not doing very well right now.  She is getting headaches every day taking Fioricet  about 2 every day.  The headache is behind her right eye and sometimes goes across her whole forehead.  The only thing that seems to work for this headache is Fioricet .  Tylenol  does not help work.  Discussed referral to neurology. PHQ 729 GAD-7 18 she denies SI and HI reports she feels bad and that is making her depression.  She is on Xanax  3 a day and has been on this dose for a number of years now. She would like to stop smoking.  She has tried Chantix and the patch and was not successful.  She has never tried Wellbutrin .  No history of a seizure disorder.   Hypertension is fairly well-controlled at 150/84.  She is on amlodipine  10 mg, HCTZ 12.5, metoprolol  100 mg daily and clonidine  0.1 daily.  Her prescription reads she is supposed to take 2 clonidine  a day but she does not. Her COPD is not well-controlled.  She is on Flovent  but has not taken it in a while.  She is requesting a refill of albuterol .  Discussed that Flovent  will help her feel better.    ROS    Objective:     BP (!) 150/84 (BP Location: Left Arm, Patient Position: Sitting, Cuff Size: Normal)   Pulse 88   Temp 97.7 F (36.5 C) (Oral)   Resp 18   Ht 5' 4 (1.626 m)   Wt 173 lb (78.5 kg)   SpO2 97%   BMI 29.70 kg/m    Physical Exam Vitals and nursing note reviewed.  Constitutional:      Appearance: Normal appearance.  HENT:     Head: Normocephalic and atraumatic.  Eyes:     Conjunctiva/sclera: Conjunctivae normal.  Cardiovascular:     Rate and Rhythm: Normal rate and regular rhythm.  Pulmonary:     Effort: Pulmonary effort is normal.     Breath sounds: Normal breath sounds.   Musculoskeletal:     Right lower leg: No edema.     Left lower leg: No edema.  Skin:    General: Skin is warm and dry.  Neurological:     Mental Status: She is alert and oriented to person, place, and time.  Psychiatric:        Mood and Affect: Mood normal.        Behavior: Behavior normal.        Thought Content: Thought content normal.        Judgment: Judgment normal.          No results found for any visits on 06/27/23.    The 10-year ASCVD risk score (Arnett DK, et al., 2019) is: 14.9%    Assessment & Plan:   Problem List Items Addressed This Visit       Cardiovascular and Mediastinum   Hypertension   Blood pressure inadequate and adequately controlled on amlodipine  10 mg HCTZ 12.5 metoprolol  succinate 100 mg and clonidine  0.1 daily.  Increase HCTZ to 25 mg.  Follow-up in a month to recheck blood pressure and labs.      Relevant Medications   hydrochlorothiazide  (HYDRODIURIL ) 25 MG tablet   Migraine without  aura and without status migrainosus, not intractable   Relevant Medications   butalbital -acetaminophen -caffeine  (FIORICET ) 50-325-40 MG tablet   hydrochlorothiazide  (HYDRODIURIL ) 25 MG tablet   buPROPion  (WELLBUTRIN  SR) 150 MG 12 hr tablet   Other Relevant Orders   Ambulatory referral to Neurology   RESOLVED: Pulmonary hypertension, primary (HCC)   Relevant Medications   hydrochlorothiazide  (HYDRODIURIL ) 25 MG tablet   Other Visit Diagnoses       Screening for colon cancer    -  Primary   Relevant Orders   Ambulatory referral to Gastroenterology     Encounter for screening mammogram for malignant neoplasm of breast         Lymphadenopathy, hilar       Relevant Orders   Ambulatory referral to Pulmonology         Return in about 4 weeks (around 07/25/2023) for BP recheck, mood disorder.    Vinie Charity K Porsche Noguchi, MD

## 2023-07-03 ENCOUNTER — Telehealth: Payer: Self-pay | Admitting: Family Medicine

## 2023-07-03 NOTE — Telephone Encounter (Signed)
 Patient was informed of provider's recommendation. Patient verbalized understanding and has agreed. All questions and concerns have been addressed.

## 2023-07-03 NOTE — Telephone Encounter (Signed)
 Pt states Wellbutrin has caused severe constipation and she stopped taking it 3-4 days ago

## 2023-07-04 ENCOUNTER — Encounter: Payer: Self-pay | Admitting: Physician Assistant

## 2023-07-04 ENCOUNTER — Ambulatory Visit (INDEPENDENT_AMBULATORY_CARE_PROVIDER_SITE_OTHER): Payer: Medicaid Other | Admitting: Physician Assistant

## 2023-07-04 VITALS — BP 165/91 | HR 82 | Ht 64.0 in | Wt 173.0 lb

## 2023-07-04 DIAGNOSIS — R339 Retention of urine, unspecified: Secondary | ICD-10-CM | POA: Diagnosis not present

## 2023-07-04 DIAGNOSIS — Z466 Encounter for fitting and adjustment of urinary device: Secondary | ICD-10-CM

## 2023-07-04 NOTE — Progress Notes (Signed)
 Cath Change/ Replacement  Patient is present today for a catheter change due to urinary retention.  8ml of water was removed from the balloon, a 16FR Silastic foley cath was removed without difficulty.  Patient was cleaned and prepped in a sterile fashion with betadine. A 16 FR foley cath was replaced into the bladder, no complications were noted. Urine return was noted 50ml and urine was clear in color. The balloon was filled with 10ml of sterile water. A night bag was attached for drainage.  Patient tolerated well.    Performed by: Eion Timbrook, PA-C   Follow up: Return in about 4 weeks (around 08/01/2023) for Catheter exchange.

## 2023-07-05 ENCOUNTER — Encounter: Payer: Self-pay | Admitting: Student in an Organized Health Care Education/Training Program

## 2023-07-10 LAB — COLOGUARD: Cologuard: NEGATIVE

## 2023-07-13 ENCOUNTER — Encounter: Payer: Self-pay | Admitting: *Deleted

## 2023-07-20 ENCOUNTER — Inpatient Hospital Stay
Admission: EM | Admit: 2023-07-20 | Discharge: 2023-07-25 | DRG: 699 | Disposition: A | Discharge status: Home / Self Care | Payer: Medicaid Other | Attending: Internal Medicine | Admitting: Internal Medicine

## 2023-07-20 ENCOUNTER — Emergency Department: Payer: Medicaid Other

## 2023-07-20 ENCOUNTER — Encounter: Payer: Self-pay | Admitting: Intensive Care

## 2023-07-20 ENCOUNTER — Other Ambulatory Visit: Payer: Self-pay

## 2023-07-20 DIAGNOSIS — Z882 Allergy status to sulfonamides status: Secondary | ICD-10-CM

## 2023-07-20 DIAGNOSIS — B965 Pseudomonas (aeruginosa) (mallei) (pseudomallei) as the cause of diseases classified elsewhere: Secondary | ICD-10-CM | POA: Diagnosis present

## 2023-07-20 DIAGNOSIS — R079 Chest pain, unspecified: Secondary | ICD-10-CM | POA: Diagnosis present

## 2023-07-20 DIAGNOSIS — E876 Hypokalemia: Secondary | ICD-10-CM | POA: Diagnosis present

## 2023-07-20 DIAGNOSIS — Z978 Presence of other specified devices: Secondary | ICD-10-CM

## 2023-07-20 DIAGNOSIS — F419 Anxiety disorder, unspecified: Secondary | ICD-10-CM | POA: Diagnosis not present

## 2023-07-20 DIAGNOSIS — J441 Chronic obstructive pulmonary disease with (acute) exacerbation: Secondary | ICD-10-CM | POA: Diagnosis not present

## 2023-07-20 DIAGNOSIS — Y846 Urinary catheterization as the cause of abnormal reaction of the patient, or of later complication, without mention of misadventure at the time of the procedure: Secondary | ICD-10-CM | POA: Diagnosis present

## 2023-07-20 DIAGNOSIS — Z23 Encounter for immunization: Secondary | ICD-10-CM

## 2023-07-20 DIAGNOSIS — Z881 Allergy status to other antibiotic agents status: Secondary | ICD-10-CM

## 2023-07-20 DIAGNOSIS — Z6831 Body mass index (BMI) 31.0-31.9, adult: Secondary | ICD-10-CM

## 2023-07-20 DIAGNOSIS — R110 Nausea: Principal | ICD-10-CM

## 2023-07-20 DIAGNOSIS — Z825 Family history of asthma and other chronic lower respiratory diseases: Secondary | ICD-10-CM

## 2023-07-20 DIAGNOSIS — F17210 Nicotine dependence, cigarettes, uncomplicated: Secondary | ICD-10-CM | POA: Diagnosis present

## 2023-07-20 DIAGNOSIS — Z1152 Encounter for screening for COVID-19: Secondary | ICD-10-CM

## 2023-07-20 DIAGNOSIS — E785 Hyperlipidemia, unspecified: Secondary | ICD-10-CM | POA: Diagnosis present

## 2023-07-20 DIAGNOSIS — T83518A Infection and inflammatory reaction due to other urinary catheter, initial encounter: Principal | ICD-10-CM | POA: Diagnosis present

## 2023-07-20 DIAGNOSIS — F32A Depression, unspecified: Secondary | ICD-10-CM | POA: Diagnosis present

## 2023-07-20 DIAGNOSIS — E669 Obesity, unspecified: Secondary | ICD-10-CM | POA: Diagnosis present

## 2023-07-20 DIAGNOSIS — J449 Chronic obstructive pulmonary disease, unspecified: Secondary | ICD-10-CM

## 2023-07-20 DIAGNOSIS — E871 Hypo-osmolality and hyponatremia: Secondary | ICD-10-CM | POA: Diagnosis not present

## 2023-07-20 DIAGNOSIS — R519 Headache, unspecified: Secondary | ICD-10-CM | POA: Diagnosis present

## 2023-07-20 DIAGNOSIS — R1013 Epigastric pain: Secondary | ICD-10-CM

## 2023-07-20 DIAGNOSIS — I10 Essential (primary) hypertension: Secondary | ICD-10-CM | POA: Diagnosis present

## 2023-07-20 DIAGNOSIS — R591 Generalized enlarged lymph nodes: Secondary | ICD-10-CM | POA: Diagnosis present

## 2023-07-20 DIAGNOSIS — Z7951 Long term (current) use of inhaled steroids: Secondary | ICD-10-CM

## 2023-07-20 DIAGNOSIS — Z79899 Other long term (current) drug therapy: Secondary | ICD-10-CM

## 2023-07-20 DIAGNOSIS — Z8249 Family history of ischemic heart disease and other diseases of the circulatory system: Secondary | ICD-10-CM

## 2023-07-20 DIAGNOSIS — K219 Gastro-esophageal reflux disease without esophagitis: Secondary | ICD-10-CM | POA: Diagnosis present

## 2023-07-20 DIAGNOSIS — R918 Other nonspecific abnormal finding of lung field: Secondary | ICD-10-CM | POA: Insufficient documentation

## 2023-07-20 LAB — MAGNESIUM: Magnesium: 1.8 mg/dL (ref 1.7–2.4)

## 2023-07-20 LAB — CBC
HCT: 37.7 % (ref 36.0–46.0)
Hemoglobin: 13.2 g/dL (ref 12.0–15.0)
MCH: 30.2 pg (ref 26.0–34.0)
MCHC: 35 g/dL (ref 30.0–36.0)
MCV: 86.3 fL (ref 80.0–100.0)
Platelets: 231 10*3/uL (ref 150–400)
RBC: 4.37 MIL/uL (ref 3.87–5.11)
RDW: 12.4 % (ref 11.5–15.5)
WBC: 4.8 10*3/uL (ref 4.0–10.5)
nRBC: 0 % (ref 0.0–0.2)

## 2023-07-20 LAB — BASIC METABOLIC PANEL
Anion gap: 11 (ref 5–15)
BUN: 7 mg/dL — ABNORMAL LOW (ref 8–23)
CO2: 27 mmol/L (ref 22–32)
Calcium: 8.6 mg/dL — ABNORMAL LOW (ref 8.9–10.3)
Chloride: 83 mmol/L — ABNORMAL LOW (ref 98–111)
Creatinine, Ser: 0.66 mg/dL (ref 0.44–1.00)
GFR, Estimated: >60 mL/min (ref >60)
Glucose, Bld: 90 mg/dL (ref 70–99)
Potassium: 3.5 mmol/L (ref 3.5–5.1)
Sodium: 121 mmol/L — ABNORMAL LOW (ref 135–145)

## 2023-07-20 LAB — RESP PANEL BY RT-PCR (RSV, FLU A&B, COVID)  RVPGX2
Influenza A by PCR: NEGATIVE
Influenza B by PCR: NEGATIVE
Resp Syncytial Virus by PCR: NEGATIVE
SARS Coronavirus 2 by RT PCR: NEGATIVE

## 2023-07-20 LAB — HEPATIC FUNCTION PANEL
ALT: 24 U/L (ref 0–44)
AST: 32 U/L (ref 15–41)
Albumin: 3.3 g/dL — ABNORMAL LOW (ref 3.5–5.0)
Alkaline Phosphatase: 78 U/L (ref 38–126)
Bilirubin, Direct: 0.3 mg/dL — ABNORMAL HIGH (ref 0.0–0.2)
Indirect Bilirubin: 0.8 mg/dL (ref 0.3–0.9)
Total Bilirubin: 1.1 mg/dL (ref 0.0–1.2)
Total Protein: 7.5 g/dL (ref 6.5–8.1)

## 2023-07-20 LAB — TROPONIN I (HIGH SENSITIVITY)
Troponin I (High Sensitivity): 6 ng/L (ref <18)
Troponin I (High Sensitivity): 5 ng/L (ref <18)

## 2023-07-20 LAB — LIPASE, BLOOD: Lipase: 28 U/L (ref 11–51)

## 2023-07-20 MED ORDER — SODIUM CHLORIDE 0.9 % IV BOLUS
1000.0000 mL | Freq: Once | INTRAVENOUS | Status: AC
  Administered 2023-07-20: 1000 mL via INTRAVENOUS

## 2023-07-20 MED ORDER — ALPRAZOLAM 1 MG PO TABS
1.0000 mg | ORAL_TABLET | Freq: Three times a day (TID) | ORAL | Status: DC
  Administered 2023-07-20 – 2023-07-25 (×14): 1 mg via ORAL
  Filled 2023-07-20 (×10): qty 1
  Filled 2023-07-20: qty 2
  Filled 2023-07-20 (×2): qty 1
  Filled 2023-07-20: qty 2
  Filled 2023-07-20: qty 1

## 2023-07-20 MED ORDER — METHYLPREDNISOLONE SODIUM SUCC 40 MG IJ SOLR
40.0000 mg | Freq: Every day | INTRAMUSCULAR | Status: DC
Start: 2023-07-21 — End: 2023-07-23
  Administered 2023-07-21 – 2023-07-23 (×3): 40 mg via INTRAVENOUS
  Filled 2023-07-20 (×3): qty 1

## 2023-07-20 MED ORDER — BUPROPION HCL ER (SR) 150 MG PO TB12: 150.0000 mg | ORAL_TABLET | Freq: Two times a day (BID) | ORAL | Status: DC

## 2023-07-20 MED ORDER — ONDANSETRON HCL 4 MG/2ML IJ SOLN
4.0000 mg | Freq: Four times a day (QID) | INTRAMUSCULAR | Status: DC | PRN
  Administered 2023-07-21 (×2): 4 mg via INTRAVENOUS
  Filled 2023-07-20 (×2): qty 2

## 2023-07-20 MED ORDER — ENOXAPARIN SODIUM 40 MG/0.4ML IJ SOSY
40.0000 mg | PREFILLED_SYRINGE | INTRAMUSCULAR | Status: DC
  Administered 2023-07-20 – 2023-07-24 (×5): 40 mg via SUBCUTANEOUS
  Filled 2023-07-20 (×5): qty 0.4

## 2023-07-20 MED ORDER — BUTALBITAL-APAP-CAFFEINE 50-325-40 MG PO TABS
1.0000 | ORAL_TABLET | Freq: Four times a day (QID) | ORAL | Status: DC | PRN
  Administered 2023-07-20: 1 via ORAL
  Filled 2023-07-20: qty 1

## 2023-07-20 MED ORDER — ACETAMINOPHEN 650 MG RE SUPP: 650.0000 mg | Freq: Four times a day (QID) | RECTAL | Status: DC | PRN

## 2023-07-20 MED ORDER — LORAZEPAM 2 MG/ML IJ SOLN
0.5000 mg | Freq: Once | INTRAMUSCULAR | Status: AC
  Administered 2023-07-20: 0.5 mg via INTRAVENOUS
  Filled 2023-07-20: qty 1

## 2023-07-20 MED ORDER — MOMETASONE FURO-FORMOTEROL FUM 100-5 MCG/ACT IN AERO
2.0000 | INHALATION_SPRAY | Freq: Two times a day (BID) | RESPIRATORY_TRACT | Status: DC
  Administered 2023-07-20 – 2023-07-25 (×10): 2 via RESPIRATORY_TRACT
  Filled 2023-07-20: qty 8.8

## 2023-07-20 MED ORDER — IOHEXOL 350 MG/ML SOLN
100.0000 mL | Freq: Once | INTRAVENOUS | Status: AC | PRN
  Administered 2023-07-20: 100 mL via INTRAVENOUS

## 2023-07-20 MED ORDER — QUETIAPINE FUMARATE 25 MG PO TABS: 100.0000 mg | ORAL_TABLET | Freq: Every day | ORAL | Status: DC

## 2023-07-20 MED ORDER — SODIUM CHLORIDE 0.9 % IV BOLUS
500.0000 mL | Freq: Once | INTRAVENOUS | Status: AC
  Administered 2023-07-20: 500 mL via INTRAVENOUS

## 2023-07-20 MED ORDER — PANTOPRAZOLE SODIUM 40 MG IV SOLR
40.0000 mg | Freq: Once | INTRAVENOUS | Status: AC
  Administered 2023-07-20: 40 mg via INTRAVENOUS
  Filled 2023-07-20: qty 10

## 2023-07-20 MED ORDER — PANTOPRAZOLE SODIUM 40 MG IV SOLR
40.0000 mg | Freq: Two times a day (BID) | INTRAVENOUS | Status: DC
  Administered 2023-07-20 – 2023-07-25 (×10): 40 mg via INTRAVENOUS
  Filled 2023-07-20 (×10): qty 10

## 2023-07-20 MED ORDER — CLONIDINE HCL 0.1 MG PO TABS: 0.1000 mg | ORAL_TABLET | Freq: Two times a day (BID) | ORAL | Status: DC

## 2023-07-20 MED ORDER — METOPROLOL SUCCINATE ER 50 MG PO TB24: 100.0000 mg | ORAL_TABLET | Freq: Every day | ORAL | Status: DC

## 2023-07-20 MED ORDER — ONDANSETRON HCL 4 MG/2ML IJ SOLN
4.0000 mg | Freq: Once | INTRAMUSCULAR | Status: AC
  Administered 2023-07-20: 4 mg via INTRAVENOUS
  Filled 2023-07-20: qty 2

## 2023-07-20 MED ORDER — ACETAMINOPHEN 325 MG PO TABS: 650.0000 mg | ORAL_TABLET | Freq: Four times a day (QID) | ORAL | Status: DC | PRN

## 2023-07-20 MED ORDER — DICYCLOMINE HCL 10 MG PO CAPS
10.0000 mg | ORAL_CAPSULE | Freq: Three times a day (TID) | ORAL | Status: DC
  Administered 2023-07-20 – 2023-07-25 (×14): 10 mg via ORAL
  Filled 2023-07-20 (×15): qty 1

## 2023-07-20 MED ORDER — GABAPENTIN 300 MG PO CAPS
600.0000 mg | ORAL_CAPSULE | Freq: Two times a day (BID) | ORAL | Status: DC
Start: 2023-07-20 — End: 2023-07-25
  Administered 2023-07-20 – 2023-07-25 (×10): 600 mg via ORAL
  Filled 2023-07-20 (×10): qty 2

## 2023-07-20 MED ORDER — IPRATROPIUM-ALBUTEROL 0.5-2.5 (3) MG/3ML IN SOLN
3.0000 mL | Freq: Four times a day (QID) | RESPIRATORY_TRACT | Status: DC
  Administered 2023-07-20 – 2023-07-22 (×7): 3 mL via RESPIRATORY_TRACT
  Filled 2023-07-20 (×8): qty 3

## 2023-07-20 MED ORDER — AMLODIPINE BESYLATE 5 MG PO TABS: 10.0000 mg | ORAL_TABLET | Freq: Every day | ORAL | Status: DC

## 2023-07-20 MED ORDER — CLONIDINE HCL 0.1 MG PO TABS: 0.1000 mg | ORAL_TABLET | Freq: Every day | ORAL | Status: DC

## 2023-07-20 MED ORDER — IPRATROPIUM-ALBUTEROL 0.5-2.5 (3) MG/3ML IN SOLN
3.0000 mL | Freq: Once | RESPIRATORY_TRACT | Status: AC
  Administered 2023-07-20: 3 mL via RESPIRATORY_TRACT
  Filled 2023-07-20: qty 3

## 2023-07-20 MED ORDER — METHYLPREDNISOLONE SODIUM SUCC 125 MG IJ SOLR
125.0000 mg | Freq: Once | INTRAMUSCULAR | Status: AC
  Administered 2023-07-20: 125 mg via INTRAVENOUS
  Filled 2023-07-20: qty 2

## 2023-07-20 MED ORDER — METOPROLOL SUCCINATE ER 50 MG PO TB24: 50.0000 mg | ORAL_TABLET | Freq: Two times a day (BID) | ORAL | Status: DC

## 2023-07-20 MED ORDER — MAGNESIUM SULFATE 2 GM/50ML IV SOLN
2.0000 g | Freq: Once | INTRAVENOUS | Status: AC
  Administered 2023-07-20: 2 g via INTRAVENOUS
  Filled 2023-07-20: qty 50

## 2023-07-20 MED ORDER — SODIUM CHLORIDE 0.9 % IV SOLN
12.5000 mg | Freq: Once | INTRAVENOUS | Status: AC
  Administered 2023-07-20: 12.5 mg via INTRAVENOUS
  Filled 2023-07-20: qty 12.5

## 2023-07-20 MED ORDER — ONDANSETRON HCL 4 MG PO TABS: 4.0000 mg | ORAL_TABLET | Freq: Four times a day (QID) | ORAL | Status: DC | PRN

## 2023-07-20 NOTE — Assessment & Plan Note (Addendum)
Held statin in the hospital.

## 2023-07-20 NOTE — H&P (Signed)
History and Physical    Patient: Katelyn [name]:[id number] DOB: 1961/02/09 DOA: 07/20/2023 DOS: the patient was seen and examined on 07/20/2023 PCP: Alease Medina, MD  Patient coming from: Home  Chief Complaint:  Chief Complaint  Patient presents with   Chest Pain   Abdominal Pain   HPI: Katelyn [name] is a 63 y.o. female with medical history significant of COPD, hypertension, GERD, hyperlipidemia, migraine, anxiety she presents to the hospital with feeling sick to her stomach over the last 6 days.  She has had nausea but no vomiting.  She has pain in her right lung.  She is short of breath she has chest pain on the left side without any radiation no sweating no vomiting.  Does have a headache.  Does also have epigastric pain.  Hospitalist services were contacted for COPD exacerbation and hyponatremia.  Patient does take hydrochlorothiazide at home. Review of Systems: Review of Systems  Constitutional:  Positive for chills, malaise/fatigue and weight loss. Negative for diaphoresis and fever.  HENT:  Negative for sore throat.   Eyes:  Negative for blurred vision.  Respiratory:  Positive for cough, shortness of breath and wheezing.   Cardiovascular:  Positive for chest pain.  Gastrointestinal:  Positive for abdominal pain and nausea. Negative for blood in stool, constipation, diarrhea and vomiting.  Genitourinary:  Negative for dysuria.  Musculoskeletal:  Positive for joint pain. Negative for myalgias.  Skin:  Negative for rash.  Neurological:  Positive for dizziness and headaches.  Endo/Heme/Allergies:  Does not bruise/bleed easily.  Psychiatric/Behavioral:  Positive for depression. The patient is nervous/anxious.     Past Medical History:  Diagnosis Date   Anxiety    COPD (chronic obstructive pulmonary disease) (HCC)    Depressive disorder    Elevated troponin 07/04/2022   GERD (gastroesophageal reflux disease)    Hiatal hernia    HLD (hyperlipidemia)    Hypertension     Hypomagnesemia 06/21/2022   Hypotension 07/02/2022   IBS (irritable bowel syndrome)    IC (interstitial cystitis)    Migraine    Nicotine dependence    Nontoxic goiter    Overactive bladder    Smoking    Past Surgical History:  Procedure Laterality Date   ABDOMINAL HYSTERECTOMY     ADENOIDECTOMY     CHOLECYSTECTOMY     HERNIA REPAIR     NECK SURGERY     SHOULDER SURGERY     TONSILLECTOMY     Social History:  reports that she has been smoking cigarettes. She started smoking about 40 years ago. She has a 20.1 pack-year smoking history. She has been exposed to tobacco smoke. She has never used smokeless tobacco. She reports that she does not drink alcohol and does not use drugs.  Allergies  Allergen Reactions   Ciprofloxacin Other (See Comments)    Headache   Doxycycline Nausea And Vomiting   Macrobid [Nitrofurantoin Monohyd Macro] Itching   Sulfa Antibiotics Itching    Family History  Problem Relation Age of Onset   COPD Mother    CAD Father        onset age 37   Heart disease Father    Kidney disease Father    Prostate cancer Father    Colon polyps Maternal Grandmother    Colon cancer Paternal Grandmother    Bladder Cancer Neg Hx    Kidney cancer Neg Hx     Prior to Admission medications   Medication Sig Start Date End Date Taking? Authorizing  Provider  albuterol (VENTOLIN HFA) 108 (90 Base) MCG/ACT inhaler Inhale 2 puffs into the lungs every 6 (six) hours as needed for wheezing or shortness of breath. 06/27/23  Yes Ziglar, Eli Phillips, MD  ALPRAZolam Prudy Feeler) 1 MG tablet Take 1 tablet (1 mg total) by mouth 3 (three) times daily. 06/27/23  Yes Ziglar, Eli Phillips, MD  amLODipine (NORVASC) 10 MG tablet Take 1 tablet (10 mg total) by mouth daily. 04/15/21  Yes Agbor-Etang, Arlys John, MD  atorvastatin (LIPITOR) 40 MG tablet Take 40 mg by mouth daily. 01/01/15  Yes [provider]  buPROPion (WELLBUTRIN SR) 150 MG 12 hr tablet Take 1 tablet (150 mg total) by mouth 2 (two) times  daily. 1 po q day for three days then 1 po BID thereafter 06/27/23  Yes Ziglar, Eli Phillips, MD  butalbital-acetaminophen-caffeine (FIORICET) [phone] MG tablet Take 1 tablet by mouth every 6 (six) hours as needed for headache or migraine. 06/27/23  Yes Ziglar, Eli Phillips, MD  cloNIDine (CATAPRES) 0.1 MG tablet Take 0.1 mg by mouth 2 (two) times daily. 02/01/23  Yes [provider]  dicyclomine (BENTYL) 10 MG capsule Take 10 mg by mouth 3 (three) times daily. 02/02/23  Yes [provider]  esomeprazole (NEXIUM) 40 MG capsule Take 40 mg by mouth daily at 12 noon.   Yes [provider]  fluticasone (FLOVENT HFA) 220 MCG/ACT inhaler Inhale 1 puff into the lungs in the morning and at bedtime. 06/27/23  Yes Ziglar, Eli Phillips, MD  gabapentin (NEURONTIN) 300 MG capsule Take 1 capsule (300 mg total) by mouth 3 (three) times daily as needed (Pain). 06/19/22 07/20/23 Yes Phineas Semen, MD  hydrochlorothiazide (HYDRODIURIL) 25 MG tablet Take 1 tablet (25 mg total) by mouth daily. 06/27/23  Yes Ziglar, Eli Phillips, MD  metoprolol succinate (TOPROL-XL) 100 MG 24 hr tablet Take 100 mg by mouth daily. 01/28/22  Yes [provider]  QUEtiapine (SEROQUEL) 100 MG tablet Take 100 mg by mouth 2 (two) times daily. 11/15/22  Yes [provider]  Vitamin D, Ergocalciferol, (DRISDOL) 1.25 MG (50000 UNIT) CAPS capsule Take 50,000 Units by mouth once a week. Monday 03/13/22  Yes [provider]    Physical Exam: Vitals:   07/20/23 1107 07/20/23 1108 07/20/23 1112 07/20/23 1552  BP:   128/87 (!) 149/90  Pulse:   64 60  Resp:   20 18  Temp:  98 F (36.7 C)  (!) 97.4 F (36.3 C)  TempSrc:  Oral  Oral  SpO2:   98% 100%  Weight: 83.9 kg     Height: 5\' 4"  (1.626 m)      Physical Exam HENT:     Head: Normocephalic.     Mouth/Throat:     Pharynx: No oropharyngeal exudate.  Eyes:     General: Lids are normal.     Conjunctiva/sclera: Conjunctivae normal.  Cardiovascular:     Rate and  Rhythm: Normal rate and regular rhythm.     Heart sounds: Normal heart sounds, S1 normal and S2 normal.  Pulmonary:     Breath sounds: Examination of the right-lower field reveals decreased breath sounds and wheezing. Examination of the left-lower field reveals decreased breath sounds and wheezing. Decreased breath sounds and wheezing present. No rhonchi or rales.  Abdominal:     Palpations: Abdomen is soft.     Tenderness: There is generalized abdominal tenderness.  Musculoskeletal:     Right lower leg: No swelling.     Left lower leg: No swelling.  Skin:    General: Skin is warm.     Findings: No rash.  Neurological:     Mental Status: She is alert and oriented to person, place, and time.     Data Reviewed: EKG interpreted by me shows normal sinus rhythm at 65 bpm incomplete right bundle branch block and nonspecific ST-T wave changes CT scan of the chest did not show any pulmonary embolism does show lymphadenopathy and some pulmonary nodules CT scan of the abdomen pelvis showed no localized findings in the abdomen or pelvis Sodium 121, creatinine 0.66, CBC within normal limits  Assessment and Plan: * COPD exacerbation (HCC) Continue Solu-Medrol and nebulizer treatments add Dulera inhaler.  Hyponatremia Sodium 121.  Get rid of hydrochlorothiazide.  Give a fluid bolus.  Regular diet.  Epigastric pain IV Protonix and as needed nausea medications.  Chest pain First troponin negative.  Headache Will give IV magnesium  Essential hypertension Continue clonidine, Toprol and Norvasc.  Hold hydrochlorothiazide.  Nausea As needed nausea medication.  Anxiety and depression Continue psychiatric medications.  Give 1 dose of IV Ativan since she does take Xanax at home.  This may help out with her nausea also and take withdrawal of benzodiazepines out of the equation.  Hyperlipidemia Hold statin until feeling better.      Advance Care Planning:   Code Status: Full Code    Consults: none    Severity of Illness: The appropriate patient status for this patient is OBSERVATION. Observation status is judged to be reasonable and necessary in order to provide the required intensity of service to ensure the patient's safety. The patient's presenting symptoms, physical exam findings, and initial radiographic and laboratory data in the context of their medical condition is felt to place them at decreased risk for further clinical deterioration. Furthermore, it is anticipated that the patient will be medically stable for discharge from the hospital within 2 midnights of admission.   Author: Alford Highland, MD 07/20/2023 6:27 PM  For on call review [email or web address].

## 2023-07-20 NOTE — ED Triage Notes (Addendum)
Patient c/o sob, left sided chest pain, abdominal pain and headaches. Reports nausea  Currently has foley catheter in place   Denies fever and vomiting

## 2023-07-20 NOTE — Assessment & Plan Note (Addendum)
2 troponins negative

## 2023-07-20 NOTE — ED Triage Notes (Signed)
Arrives from home via [facility].  C?O chest pain, dizziness, headache, SOB x 6 days.  CBG: 49  recheck, 147 132/80 64 100% RA  4 zofran and 324 ASA given PTA. Denies N/V 18g LAC

## 2023-07-20 NOTE — Assessment & Plan Note (Addendum)
Persistent nausea.  Patient is eating without vomiting..  CT scan of the head negative.  MRI of the brain negative.  Prescribed Reglan and Phenergan to go home with.

## 2023-07-20 NOTE — Assessment & Plan Note (Addendum)
Continue prednisone and nebulizer treatments and Dulera inhaler.  Lungs are clear when she breathes through her nose.

## 2023-07-20 NOTE — Assessment & Plan Note (Addendum)
Will give IV magnesium 1 dose of Toradol.  Patient on Fioricet.  Will get a CT scan of the head with increasing frequency.

## 2023-07-20 NOTE — Assessment & Plan Note (Addendum)
Restart Toprol 50 mg twice a day and Norvasc 5 mg daily with blood pressure now elevated.

## 2023-07-20 NOTE — Assessment & Plan Note (Addendum)
Sodium 132.  Discontinued hydrochlorothiazide.  Regular diet.

## 2023-07-20 NOTE — ED Provider Notes (Signed)
[facility] Provider Note    Event Date/Time   First MD Initiated Contact with Patient 07/20/23 1505     (approximate)   History   Chest Pain and Abdominal Pain   HPI  Katelyn [name] is a 63 y.o. female with history of hyponatremia, hypertension who comes in with concerns for chest and abdominal pain.  Patient reports 6 days of chest pain shortness of breath.  Does have a history of COPD.  She also reports some abdominal pain nausea.  She states that she has not been able to take her salt tablets secondary to not feeling well.  She reports feeling very weak.  Denies any headaches or falls or hitting her head.  Physical Exam   Triage Vital Signs: ED Triage Vitals  Encounter Vitals Group     BP 07/20/23 1112 128/87     Systolic BP Percentile --      Diastolic BP Percentile --      Pulse Rate 07/20/23 1112 64     Resp 07/20/23 1112 20     Temp 07/20/23 1108 98 F (36.7 C)     Temp Source 07/20/23 1108 Oral     SpO2 07/20/23 1112 98 %     Weight 07/20/23 1107 185 lb (83.9 kg)     Height 07/20/23 1107 5\' 4"  (1.626 m)     Head Circumference --      Peak Flow --      Pain Score 07/20/23 1107 10     Pain Loc --      Pain Education --      Exclude from Growth Chart --     Most recent vital signs: Vitals:   07/20/23 1108 07/20/23 1112  BP:  128/87  Pulse:  64  Resp:  20  Temp: 98 F (36.7 C)   SpO2:  98%     General: Awake, no distress.  CV:  Good peripheral perfusion.  Resp:  Normal effort.  Wheeze noted bilaterally Abd:  No distention.  Mild diffuse tenderness Other:  No swelling in legs   ED Results / Procedures / Treatments   Labs (all labs ordered are listed, but only abnormal results are displayed) Labs Reviewed  BASIC METABOLIC PANEL - Abnormal; Notable for the following components:      Result Value   Sodium 121 (*)    Chloride 83 (*)    BUN 7 (*)    Calcium 8.6 (*)    All other components within normal limits  RESP  PANEL BY RT-PCR (RSV, FLU A&B, COVID)  RVPGX2  CBC  TROPONIN I (HIGH SENSITIVITY)  TROPONIN I (HIGH SENSITIVITY)     EKG  My interpretation of EKG:  Normal sinus rate 65 without any ST elevation or T wave inversions, normal intervals  RADIOLOGY I have reviewed the xray personally and interpreted no pneumonia PROCEDURES:  Critical Care performed: No  Procedures   MEDICATIONS ORDERED IN ED: Medications  ondansetron (ZOFRAN) injection 4 mg (4 mg Intravenous Given 07/20/23 1559)  methylPREDNISolone sodium succinate (SOLU-MEDROL) 125 mg/2 mL injection 125 mg (125 mg Intravenous Given 07/20/23 1604)  ipratropium-albuterol (DUONEB) 0.5-2.5 (3) MG/3ML nebulizer solution 3 mL (3 mLs Nebulization Given 07/20/23 1606)  sodium chloride 0.9 % bolus 500 mL (500 mLs Intravenous New Bag/Given 07/20/23 1600)  iohexol (OMNIPAQUE) 350 MG/ML injection 100 mL (100 mLs Intravenous Contrast Given 07/20/23 1526)     IMPRESSION / MDM / ASSESSMENT AND PLAN / ED COURSE  I  reviewed the triage vital signs and the nursing notes.   Patient's presentation is most consistent with acute presentation with potential threat to life or bodily function.   Patient comes in with shortness of breath, chest pain.  Differential ACS PE acute abdominal process.  Discussed CT imaging of her abdomen to rule out perforation obstruction and given she also reports some chest pain and shortness of breath will get PET scan as well. Imaging overall reassuring does have some lymph nodes I discussed with patient that she may want to get a biopsy to ensure there is no cancer.  She does have symptoms of COPD.  She still does not feel better after medications and is hyponatremic.  We discussed admission versus going home patient states that if she goes home she think she will be right back for her low sodium therefore I will discuss possible team for admission   Covid/flu negative BMP sodium 121 CBC normal      FINAL CLINICAL  IMPRESSION(S) / ED DIAGNOSES   Final diagnoses:  Nausea  Chronic obstructive pulmonary disease, unspecified COPD type (HCC)  Hyponatremia     Rx / DC Orders   ED Discharge Orders     None        Note:  This document was prepared using Dragon voice recognition software and may include unintentional dictation errors.   Concha Se, MD 07/20/23 1728

## 2023-07-20 NOTE — Assessment & Plan Note (Addendum)
IV Protonix while here.  Patient states that she cannot go under anesthesia secondary to her lungs.  As needed nausea medications.

## 2023-07-20 NOTE — ED Provider Triage Note (Signed)
Emergency Medicine Provider Triage Evaluation Note  Katelyn [name] , a 63 y.o. female  was evaluated in triage.  Pt complains of CP, nausea for past 6 days.  Review of Systems  Positive: Dizziness, h/a, SOB Negative: No v, d or flu sx  Physical Exam  BP 128/87 (BP Location: Left Arm)   Pulse 64   Temp 98 F (36.7 C) (Oral)   Resp 20   Ht 5\' 4"  (1.626 m)   Wt 83.9 kg   SpO2 98%   BMI 31.76 kg/m  Gen:   Awake, no distress   Resp:  Normal effort    MSK:   Moves extremities without difficulty  Other:    Medical Decision Making  Medically screening exam initiated at 11:17 AM.  Appropriate orders placed.  Katelyn [name] was informed that the remainder of the evaluation will be completed by another provider, this initial triage assessment does not replace that evaluation, and the importance of remaining in the ED until their evaluation is complete.     [name], PA-C 07/20/23 1123

## 2023-07-20 NOTE — ED Notes (Signed)
Report given to [facility]

## 2023-07-20 NOTE — Assessment & Plan Note (Addendum)
Continue psychiatric medications.

## 2023-07-21 ENCOUNTER — Observation Stay: Payer: Medicaid Other

## 2023-07-21 DIAGNOSIS — Z7951 Long term (current) use of inhaled steroids: Secondary | ICD-10-CM | POA: Diagnosis not present

## 2023-07-21 DIAGNOSIS — Z1152 Encounter for screening for COVID-19: Secondary | ICD-10-CM | POA: Diagnosis not present

## 2023-07-21 DIAGNOSIS — T83518A Infection and inflammatory reaction due to other urinary catheter, initial encounter: Secondary | ICD-10-CM | POA: Diagnosis present

## 2023-07-21 DIAGNOSIS — Z23 Encounter for immunization: Secondary | ICD-10-CM | POA: Diagnosis not present

## 2023-07-21 DIAGNOSIS — I10 Essential (primary) hypertension: Secondary | ICD-10-CM

## 2023-07-21 DIAGNOSIS — J441 Chronic obstructive pulmonary disease with (acute) exacerbation: Secondary | ICD-10-CM

## 2023-07-21 DIAGNOSIS — R1013 Epigastric pain: Secondary | ICD-10-CM | POA: Diagnosis present

## 2023-07-21 DIAGNOSIS — R110 Nausea: Secondary | ICD-10-CM | POA: Diagnosis present

## 2023-07-21 DIAGNOSIS — Z825 Family history of asthma and other chronic lower respiratory diseases: Secondary | ICD-10-CM | POA: Diagnosis not present

## 2023-07-21 DIAGNOSIS — Z6831 Body mass index (BMI) 31.0-31.9, adult: Secondary | ICD-10-CM | POA: Diagnosis not present

## 2023-07-21 DIAGNOSIS — E876 Hypokalemia: Secondary | ICD-10-CM | POA: Diagnosis present

## 2023-07-21 DIAGNOSIS — K219 Gastro-esophageal reflux disease without esophagitis: Secondary | ICD-10-CM | POA: Diagnosis present

## 2023-07-21 DIAGNOSIS — F419 Anxiety disorder, unspecified: Secondary | ICD-10-CM

## 2023-07-21 DIAGNOSIS — R918 Other nonspecific abnormal finding of lung field: Secondary | ICD-10-CM

## 2023-07-21 DIAGNOSIS — B965 Pseudomonas (aeruginosa) (mallei) (pseudomallei) as the cause of diseases classified elsewhere: Secondary | ICD-10-CM | POA: Diagnosis present

## 2023-07-21 DIAGNOSIS — E669 Obesity, unspecified: Secondary | ICD-10-CM | POA: Insufficient documentation

## 2023-07-21 DIAGNOSIS — R079 Chest pain, unspecified: Secondary | ICD-10-CM | POA: Diagnosis present

## 2023-07-21 DIAGNOSIS — Z8249 Family history of ischemic heart disease and other diseases of the circulatory system: Secondary | ICD-10-CM | POA: Diagnosis not present

## 2023-07-21 DIAGNOSIS — E871 Hypo-osmolality and hyponatremia: Secondary | ICD-10-CM | POA: Diagnosis present

## 2023-07-21 DIAGNOSIS — E785 Hyperlipidemia, unspecified: Secondary | ICD-10-CM

## 2023-07-21 DIAGNOSIS — Z881 Allergy status to other antibiotic agents status: Secondary | ICD-10-CM | POA: Diagnosis not present

## 2023-07-21 DIAGNOSIS — F32A Depression, unspecified: Secondary | ICD-10-CM

## 2023-07-21 DIAGNOSIS — Y846 Urinary catheterization as the cause of abnormal reaction of the patient, or of later complication, without mention of misadventure at the time of the procedure: Secondary | ICD-10-CM | POA: Diagnosis present

## 2023-07-21 DIAGNOSIS — Z978 Presence of other specified devices: Secondary | ICD-10-CM

## 2023-07-21 DIAGNOSIS — R519 Headache, unspecified: Secondary | ICD-10-CM | POA: Diagnosis present

## 2023-07-21 DIAGNOSIS — F17210 Nicotine dependence, cigarettes, uncomplicated: Secondary | ICD-10-CM | POA: Diagnosis present

## 2023-07-21 DIAGNOSIS — Z882 Allergy status to sulfonamides status: Secondary | ICD-10-CM | POA: Diagnosis not present

## 2023-07-21 DIAGNOSIS — R0789 Other chest pain: Secondary | ICD-10-CM

## 2023-07-21 DIAGNOSIS — Z79899 Other long term (current) drug therapy: Secondary | ICD-10-CM | POA: Diagnosis not present

## 2023-07-21 LAB — URINALYSIS, COMPLETE (UACMP) WITH MICROSCOPIC
Bilirubin Urine: NEGATIVE
Glucose, UA: NEGATIVE mg/dL
Ketones, ur: NEGATIVE mg/dL
Nitrite: NEGATIVE
Protein, ur: NEGATIVE mg/dL
Specific Gravity, Urine: 1.004 — ABNORMAL LOW (ref 1.005–1.030)
WBC, UA: >50 WBC/hpf (ref 0–5)
pH: 7 (ref 5.0–8.0)

## 2023-07-21 LAB — CBC
HCT: 34 % — ABNORMAL LOW (ref 36.0–46.0)
Hemoglobin: 11.9 g/dL — ABNORMAL LOW (ref 12.0–15.0)
MCH: 30.2 pg (ref 26.0–34.0)
MCHC: 35 g/dL (ref 30.0–36.0)
MCV: 86.3 fL (ref 80.0–100.0)
Platelets: 199 10*3/uL (ref 150–400)
RBC: 3.94 MIL/uL (ref 3.87–5.11)
RDW: 12.5 % (ref 11.5–15.5)
WBC: 7.2 10*3/uL (ref 4.0–10.5)
nRBC: 0 % (ref 0.0–0.2)

## 2023-07-21 LAB — RESPIRATORY PANEL BY PCR

## 2023-07-21 LAB — BASIC METABOLIC PANEL
Anion gap: 10 (ref 5–15)
BUN: 9 mg/dL (ref 8–23)
CO2: 25 mmol/L (ref 22–32)
Calcium: 8.3 mg/dL — ABNORMAL LOW (ref 8.9–10.3)
Chloride: 91 mmol/L — ABNORMAL LOW (ref 98–111)
Creatinine, Ser: 0.58 mg/dL (ref 0.44–1.00)
GFR, Estimated: >60 mL/min (ref >60)
Glucose, Bld: 114 mg/dL — ABNORMAL HIGH (ref 70–99)
Potassium: 3 mmol/L — ABNORMAL LOW (ref 3.5–5.1)
Sodium: 126 mmol/L — ABNORMAL LOW (ref 135–145)

## 2023-07-21 LAB — HIV ANTIBODY (ROUTINE TESTING W REFLEX): HIV Screen 4th Generation wRfx: NONREACTIVE

## 2023-07-21 MED ORDER — PNEUMOCOCCAL 20-VAL CONJ VACC 0.5 ML IM SUSY
0.5000 mL | PREFILLED_SYRINGE | INTRAMUSCULAR | Status: AC
  Administered 2023-07-25: 0.5 mL via INTRAMUSCULAR
  Filled 2023-07-21 (×2): qty 0.5

## 2023-07-21 MED ORDER — INFLUENZA VIRUS VACC SPLIT PF (FLUZONE) 0.5 ML IM SUSY
0.5000 mL | PREFILLED_SYRINGE | INTRAMUSCULAR | Status: AC
  Administered 2023-07-22: 0.5 mL via INTRAMUSCULAR
  Filled 2023-07-21: qty 0.5

## 2023-07-21 MED ORDER — BUTALBITAL-APAP-CAFFEINE 50-325-40 MG PO TABS
2.0000 | ORAL_TABLET | Freq: Four times a day (QID) | ORAL | Status: DC | PRN
  Administered 2023-07-21 – 2023-07-25 (×10): 2 via ORAL
  Filled 2023-07-21 (×10): qty 2

## 2023-07-21 MED ORDER — SODIUM CHLORIDE 0.9 % IV SOLN
12.5000 mg | Freq: Four times a day (QID) | INTRAVENOUS | Status: DC | PRN
  Administered 2023-07-21 – 2023-07-24 (×5): 12.5 mg via INTRAVENOUS
  Filled 2023-07-21 (×5): qty 12.5

## 2023-07-21 MED ORDER — POTASSIUM CHLORIDE 10 MEQ/100ML IV SOLN
10.0000 meq | INTRAVENOUS | Status: AC
Start: 2023-07-21 — End: 2023-07-21
  Administered 2023-07-21 (×2): 10 meq via INTRAVENOUS
  Filled 2023-07-21 (×2): qty 100

## 2023-07-21 MED ORDER — QUETIAPINE FUMARATE 25 MG PO TABS
100.0000 mg | ORAL_TABLET | Freq: Every day | ORAL | Status: DC
Start: 2023-07-21 — End: 2023-07-25
  Administered 2023-07-21 – 2023-07-24 (×5): 100 mg via ORAL
  Filled 2023-07-21 (×5): qty 4

## 2023-07-21 MED ORDER — MAGNESIUM SULFATE 2 GM/50ML IV SOLN
2.0000 g | Freq: Once | INTRAVENOUS | Status: AC
Start: 2023-07-21 — End: 2023-07-21
  Administered 2023-07-21: 2 g via INTRAVENOUS
  Filled 2023-07-21: qty 50

## 2023-07-21 MED ORDER — KETOROLAC TROMETHAMINE 15 MG/ML IJ SOLN
15.0000 mg | Freq: Once | INTRAMUSCULAR | Status: AC
  Administered 2023-07-21: 15 mg via INTRAVENOUS
  Filled 2023-07-21: qty 1

## 2023-07-21 MED ORDER — SODIUM CHLORIDE 0.9 % IV BOLUS
500.0000 mL | Freq: Once | INTRAVENOUS | Status: AC
  Administered 2023-07-21: 500 mL via INTRAVENOUS

## 2023-07-21 NOTE — ED Notes (Signed)
 Patient transported to CT

## 2023-07-21 NOTE — Assessment & Plan Note (Signed)
Will give a few rounds of potassium.

## 2023-07-21 NOTE — Assessment & Plan Note (Signed)
Follow-up as outpatient.  Patient also has lymphadenopathy in the chest.

## 2023-07-21 NOTE — Assessment & Plan Note (Signed)
BMI 31.76

## 2023-07-21 NOTE — ED Notes (Signed)
 PT at bedside.

## 2023-07-21 NOTE — Evaluation (Signed)
Physical Therapy Evaluation Patient Details Name: Katelyn [name] MRN: [id number] DOB: 09/17/1960 Today's Date: 07/21/2023  History of Present Illness  Pt is a 62yo female that presented to ED for an upset stomach, lung pain, admitted for hyponatremia and COPD exacerbation. PMH: anxiety, depression, HTN, tobacco abuse, COPD.   Clinical Impression  Patient A&Ox4, reported 10/10 pain in her abdomen and head. Pt stated at baseline she is modI for ADLs, son assist with IADLs like transportation, groceries, cooking, cleaning, and she is normally ambulatory without AD. She was able to demonstrate bed mobility modI, sit <> stand with supervision and ambulate ~33ft with handheld assist (not necessarily required). No LOB noted and distance limited due to pt pain. spO2 on room air 100% after ambulating. The patient demonstrated and reported near return to baseline level of functioning, no further acute PT needs indicated. PT to sign off. Please reconsult PT if pt status changes or acute needs are identified.           If plan is discharge home, recommend the following: Assistance with cooking/housework;Assist for transportation;Help with stairs or ramp for entrance   Can travel by private vehicle        Equipment Recommendations None recommended by PT  Recommendations for Other Services       Functional Status Assessment Patient has not had a recent decline in their functional status     Precautions / Restrictions Precautions Precautions: Fall Restrictions Weight Bearing Restrictions Per Provider Order: No      Mobility  Bed Mobility Overal bed mobility: Modified Independent                  Transfers Overall transfer level: Modified independent Equipment used: 1 person hand held assist, None                    Ambulation/Gait Ambulation/Gait assistance: Supervision Gait Distance (Feet): 40 Feet Assistive device: 1 person hand held assist         General Gait  Details: handheld assist provided but not required  Stairs            Wheelchair Mobility     Tilt Bed    Modified Rankin (Stroke Patients Only)       Balance Overall balance assessment: Needs assistance Sitting-balance support: Feet supported Sitting balance-Leahy Scale: Normal     Standing balance support: Single extremity supported Standing balance-Leahy Scale: Good                               Pertinent Vitals/Pain Pain Assessment Pain Assessment: 0-10 Pain Score: 10-Worst pain ever Pain Location: abdomen, HA Pain Descriptors / Indicators: Aching, Sore, Grimacing Pain Intervention(s): Limited activity within patient's tolerance, Monitored during session, Premedicated before session, Patient requesting pain meds-RN notified    Home Living Family/patient expects to be discharged to:: Private residence Living Arrangements: Children (son) Available Help at Discharge: Family Type of Home: House Home Access: Stairs to enter Entrance Stairs-Rails: Left Entrance Stairs-Number of Steps: 4-5 at side and 3 on the front, rails on side entrance only   Home Layout: One level Home Equipment: Agricultural consultant (2 wheels);Cane - single point      Prior Function               Mobility Comments: mobile without AD ADLs Comments: independent for ADLs, son assists with IADLs like transportation and groceries and does her own laundry  Extremity/Trunk Assessment   Upper Extremity Assessment Upper Extremity Assessment: Overall WFL for tasks assessed    Lower Extremity Assessment Lower Extremity Assessment: Overall WFL for tasks assessed       Communication      Cognition Arousal: Alert Behavior During Therapy: WFL for tasks assessed/performed Overall Cognitive Status: Within Functional Limits for tasks assessed                                          General Comments      Exercises     Assessment/Plan    PT Assessment  Patient does not need any further PT services  PT Problem List         PT Treatment Interventions      PT Goals (Current goals can be found in the Care Plan section)       Frequency       Co-evaluation               AM-PAC PT "6 Clicks" Mobility  Outcome Measure Help needed turning from your back to your side while in a flat bed without using bedrails?: None Help needed moving from lying on your back to sitting on the side of a flat bed without using bedrails?: None Help needed moving to and from a bed to a chair (including a wheelchair)?: None Help needed standing up from a chair using your arms (e.g., wheelchair or bedside chair)?: None Help needed to walk in hospital room?: None Help needed climbing 3-5 steps with a railing? : A Little 6 Click Score: 23    End of Session   Activity Tolerance: Patient tolerated treatment well Patient left: in bed;with call bell/phone within reach;with bed alarm set Nurse Communication: Mobility status PT Visit Diagnosis: Other abnormalities of gait and mobility (R26.89)    Time: 1610-9604 PT Time Calculation (min) (ACUTE ONLY): 14 min   Charges:   PT Evaluation $PT Eval Low Complexity: 1 Low   PT General Charges $$ ACUTE PT VISIT: 1 Visit         Olga Coaster PT, DPT 9:17 AM,07/21/23

## 2023-07-21 NOTE — Hospital Course (Signed)
63 y.o. female with medical history significant of COPD, hypertension, GERD, hyperlipidemia, migraine, anxiety she presents to the hospital with feeling sick to her stomach over the last 6 days.  She has had nausea but no vomiting.  She has pain in her right lung.  She is short of breath she has chest pain on the left side without any radiation no sweating no vomiting.  Does have a headache.  Does also have epigastric pain.  Hospitalist services were contacted for COPD exacerbation and hyponatremia.  Patient does take hydrochlorothiazide at home.   1/31.  Patient still not feeling any better still had some nausea and retching.  Still having headache.  Will take out Foley catheter and replace with new Foley catheter so we can send off a urine. 2/1.  Patient still feeling nauseous but is eating.  Today's potassium very low at 2.7.  Still having headache but relieved with Fioricet. 2/2.  Patient still nauseous.  Still having headache.  Urine culture growing 100,000 gram-negative rods.

## 2023-07-21 NOTE — Assessment & Plan Note (Signed)
Will have nursing staff changed the catheter and get a urine sample from the new catheter.

## 2023-07-21 NOTE — ED Notes (Signed)
Pt requested something for nausea 

## 2023-07-21 NOTE — Progress Notes (Signed)
Progress Note   Patient: Katelyn [name]:[id number] DOB: 1960-09-26 DOA: 07/20/2023     0 DOS: the patient was seen and examined on 07/21/2023   Brief hospital course: 63 y.o. female with medical history significant of COPD, hypertension, GERD, hyperlipidemia, migraine, anxiety she presents to the hospital with feeling sick to her stomach over the last 6 days.  She has had nausea but no vomiting.  She has pain in her right lung.  She is short of breath she has chest pain on the left side without any radiation no sweating no vomiting.  Does have a headache.  Does also have epigastric pain.  Hospitalist services were contacted for COPD exacerbation and hyponatremia.  Patient does take hydrochlorothiazide at home.   1/31.  Patient still not feeling any better still had some nausea and retching.  Still having headache.  Will take out Foley catheter and replace with new Foley catheter so we can send off a urine.  Assessment and Plan: * Nausea Continue as needed nausea medication.  Patient does have a chronic Foley catheter.  Will remove catheter and send off for urine analysis and culture with straight catheterization.  Will get a CT scan of the head.  COPD exacerbation (HCC) Continue Solu-Medrol and nebulizer treatments and Dulera inhaler.  Hyponatremia Sodium 126.  Get rid of hydrochlorothiazide.  Give another fluid bolus.  Regular diet.  Epigastric pain IV Protonix and as needed nausea medications.  Hypokalemia Will give a few rounds of potassium.  Headache Will give IV magnesium 1 dose of Toradol.  Patient on Fioricet.  Will get a CT scan of the head with increasing frequency.  Essential hypertension Blood pressure on the lower side this morning.  Not orthostatic.  Hold all hypertensive medications.  Anxiety and depression Continue psychiatric medications.  Chest pain 2 troponins negative  Obesity (BMI 30-39.9) BMI 31.76.  Chronic indwelling Foley catheter Will have  nursing staff changed the catheter and get a urine sample from the new catheter.  Pulmonary nodules Follow-up as outpatient.  Patient also has lymphadenopathy in the chest.  Hyperlipidemia Hold statin until feeling better.        Subjective: Patient still not feeling better.  Blood pressure on the lower side and normally her blood pressure was very high.  Holding all antihypertensive medications.  Still with nausea and retching not being able to eat very much.  Concerned that her urine has a smell.  Will get rid of her catheter and send off a urine from a new catheter.  Physical Exam: Vitals:   07/21/23 0200 07/21/23 0600 07/21/23 0700 07/21/23 1000  BP: (!) 109/58 (!) 101/53 118/62 101/62  Pulse: 73 73 71 79  Resp: (!) 26 20 18  (!) 21  Temp: 97.9 F (36.6 C) 97.9 F (36.6 C)  98.1 F (36.7 C)  TempSrc: Oral Oral  Oral  SpO2: 98% 97% 100% 100%  Weight:      Height:       Physical Exam HENT:     Head: Normocephalic.     Mouth/Throat:     Pharynx: No oropharyngeal exudate.  Eyes:     General: Lids are normal.     Conjunctiva/sclera: Conjunctivae normal.  Cardiovascular:     Rate and Rhythm: Normal rate and regular rhythm.     Heart sounds: Normal heart sounds, S1 normal and S2 normal.  Pulmonary:     Breath sounds: Examination of the right-lower field reveals decreased breath sounds and wheezing. Examination of the  left-lower field reveals decreased breath sounds and wheezing. Decreased breath sounds and wheezing present. No rhonchi or rales.  Abdominal:     Palpations: Abdomen is soft.     Tenderness: There is abdominal tenderness in the epigastric area.  Musculoskeletal:     Right lower leg: No swelling.     Left lower leg: No swelling.  Skin:    General: Skin is warm.     Findings: No rash.  Neurological:     Mental Status: She is alert and oriented to person, place, and time.     Data Reviewed: Sodium 126, potassium 3.0, creatinine 0.58, hemoglobin  11.9   Disposition: Status is: Observation Will continue to watch today will give another fluid bolus get a CT scan of the head try to settle down headache and nausea.  Will send off her urine from a new catheter.  Planned Discharge Destination: Home    Time spent: 28 minutes  Author: Alford Highland, MD 07/21/2023 1:27 PM  For on call review [email or web address].

## 2023-07-21 NOTE — ED Notes (Signed)
Breakfast tray provided to patient; patient able to feed self.

## 2023-07-22 DIAGNOSIS — E876 Hypokalemia: Secondary | ICD-10-CM | POA: Diagnosis not present

## 2023-07-22 DIAGNOSIS — J441 Chronic obstructive pulmonary disease with (acute) exacerbation: Secondary | ICD-10-CM | POA: Diagnosis not present

## 2023-07-22 DIAGNOSIS — E669 Obesity, unspecified: Secondary | ICD-10-CM

## 2023-07-22 DIAGNOSIS — E871 Hypo-osmolality and hyponatremia: Secondary | ICD-10-CM | POA: Diagnosis not present

## 2023-07-22 DIAGNOSIS — R079 Chest pain, unspecified: Secondary | ICD-10-CM

## 2023-07-22 DIAGNOSIS — R110 Nausea: Secondary | ICD-10-CM | POA: Diagnosis not present

## 2023-07-22 LAB — MAGNESIUM: Magnesium: 2.2 mg/dL (ref 1.7–2.4)

## 2023-07-22 LAB — COMPREHENSIVE METABOLIC PANEL
ALT: 36 U/L (ref 0–44)
AST: 35 U/L (ref 15–41)
Albumin: 2.8 g/dL — ABNORMAL LOW (ref 3.5–5.0)
Alkaline Phosphatase: 62 U/L (ref 38–126)
Anion gap: 8 (ref 5–15)
BUN: 10 mg/dL (ref 8–23)
CO2: 27 mmol/L (ref 22–32)
Calcium: 8.3 mg/dL — ABNORMAL LOW (ref 8.9–10.3)
Chloride: 97 mmol/L — ABNORMAL LOW (ref 98–111)
Creatinine, Ser: 0.62 mg/dL (ref 0.44–1.00)
GFR, Estimated: >60 mL/min (ref >60)
Glucose, Bld: 101 mg/dL — ABNORMAL HIGH (ref 70–99)
Potassium: 2.7 mmol/L — CL (ref 3.5–5.1)
Sodium: 132 mmol/L — ABNORMAL LOW (ref 135–145)
Total Bilirubin: 0.4 mg/dL (ref 0.0–1.2)
Total Protein: 6 g/dL — ABNORMAL LOW (ref 6.5–8.1)

## 2023-07-22 LAB — CBC WITH DIFFERENTIAL/PLATELET
Abs Immature Granulocytes: 0.03 10*3/uL (ref 0.00–0.07)
Basophils Absolute: 0 10*3/uL (ref 0.0–0.1)
Basophils Relative: 0 %
Eosinophils Absolute: 0 10*3/uL (ref 0.0–0.5)
Eosinophils Relative: 1 %
HCT: 31.7 % — ABNORMAL LOW (ref 36.0–46.0)
Hemoglobin: 10.9 g/dL — ABNORMAL LOW (ref 12.0–15.0)
Immature Granulocytes: 1 %
Lymphocytes Relative: 22 %
Lymphs Abs: 1.3 10*3/uL (ref 0.7–4.0)
MCH: 29.9 pg (ref 26.0–34.0)
MCHC: 34.4 g/dL (ref 30.0–36.0)
MCV: 87.1 fL (ref 80.0–100.0)
Monocytes Absolute: 0.6 10*3/uL (ref 0.1–1.0)
Monocytes Relative: 10 %
Neutro Abs: 3.8 10*3/uL (ref 1.7–7.7)
Neutrophils Relative %: 66 %
Platelets: 183 10*3/uL (ref 150–400)
RBC: 3.64 MIL/uL — ABNORMAL LOW (ref 3.87–5.11)
RDW: 13 % (ref 11.5–15.5)
WBC: 5.8 10*3/uL (ref 4.0–10.5)
nRBC: 0 % (ref 0.0–0.2)

## 2023-07-22 LAB — PHOSPHORUS: Phosphorus: 2.6 mg/dL (ref 2.5–4.6)

## 2023-07-22 MED ORDER — AMLODIPINE BESYLATE 5 MG PO TABS
5.0000 mg | ORAL_TABLET | Freq: Every day | ORAL | Status: DC
  Administered 2023-07-22 – 2023-07-23 (×2): 5 mg via ORAL
  Filled 2023-07-22 (×2): qty 1

## 2023-07-22 MED ORDER — POTASSIUM CHLORIDE CRYS ER 20 MEQ PO TBCR
40.0000 meq | EXTENDED_RELEASE_TABLET | Freq: Once | ORAL | Status: AC
  Administered 2023-07-22: 40 meq via ORAL
  Filled 2023-07-22: qty 2

## 2023-07-22 MED ORDER — DOCUSATE SODIUM 100 MG PO CAPS
200.0000 mg | ORAL_CAPSULE | Freq: Two times a day (BID) | ORAL | Status: DC
  Administered 2023-07-22 – 2023-07-25 (×7): 200 mg via ORAL
  Filled 2023-07-22 (×7): qty 2

## 2023-07-22 MED ORDER — METOPROLOL SUCCINATE ER 25 MG PO TB24
50.0000 mg | ORAL_TABLET | Freq: Two times a day (BID) | ORAL | Status: DC
  Administered 2023-07-22 – 2023-07-25 (×7): 50 mg via ORAL
  Filled 2023-07-22 (×7): qty 2

## 2023-07-22 MED ORDER — POTASSIUM CHLORIDE 10 MEQ/100ML IV SOLN
10.0000 meq | INTRAVENOUS | Status: DC
  Administered 2023-07-22: 10 meq via INTRAVENOUS
  Filled 2023-07-22 (×4): qty 100

## 2023-07-22 MED ORDER — SODIUM CHLORIDE 0.9 % IV SOLN
1.0000 g | INTRAVENOUS | Status: DC
  Administered 2023-07-22 – 2023-07-23 (×2): 1 g via INTRAVENOUS
  Filled 2023-07-22 (×3): qty 10

## 2023-07-22 MED ORDER — BISACODYL 5 MG PO TBEC: 5.0000 mg | DELAYED_RELEASE_TABLET | Freq: Every day | ORAL | Status: DC | PRN

## 2023-07-22 MED ORDER — POTASSIUM CHLORIDE 10 MEQ/100ML IV SOLN
10.0000 meq | INTRAVENOUS | Status: AC
  Administered 2023-07-22: 10 meq via INTRAVENOUS
  Filled 2023-07-22 (×2): qty 100

## 2023-07-22 MED ORDER — ALBUTEROL SULFATE HFA 108 (90 BASE) MCG/ACT IN AERS
2.0000 | INHALATION_SPRAY | RESPIRATORY_TRACT | Status: DC | PRN
  Administered 2023-07-22 – 2023-07-23 (×2): 2 via RESPIRATORY_TRACT
  Filled 2023-07-22: qty 6.7

## 2023-07-22 MED ORDER — ALBUTEROL (5 MG/ML) CONTINUOUS INHALATION SOLN
2.5000 mg | INHALATION_SOLUTION | Freq: Three times a day (TID) | RESPIRATORY_TRACT | Status: DC
  Filled 2023-07-22: qty 20

## 2023-07-22 NOTE — Progress Notes (Signed)
Progress Note   Patient: Katelyn [name] MVH:[id number] DOB: 30-Aug-1960 DOA: 07/20/2023     1 DOS: the patient was seen and examined on 07/22/2023   Brief hospital course: 63 y.o. female with medical history significant of COPD, hypertension, GERD, hyperlipidemia, migraine, anxiety she presents to the hospital with feeling sick to her stomach over the last 6 days.  She has had nausea but no vomiting.  She has pain in her right lung.  She is short of breath she has chest pain on the left side without any radiation no sweating no vomiting.  Does have a headache.  Does also have epigastric pain.  Hospitalist services were contacted for COPD exacerbation and hyponatremia.  Patient does take hydrochlorothiazide at home.   1/31.  Patient still not feeling any better still had some nausea and retching.  Still having headache.  Will take out Foley catheter and replace with new Foley catheter so we can send off a urine. 2/1.  Patient still feeling nauseous but is eating.  Today's potassium very low at 2.7.  Still having headache but relieved with Fioricet.  Assessment and Plan: * Hypokalemia Will give a few rounds of IV potassium and will do 2 doses of oral potassium.  Patient states she is eating.  Nausea Continue as needed nausea medication.  CT scan of the head negative.  COPD exacerbation (HCC) Continue Solu-Medrol and nebulizer treatments and Dulera inhaler.  Hyponatremia Sodium 132.  Discontinued hydrochlorothiazide.  Regular diet.  Epigastric pain IV Protonix and as needed nausea medications.  Headache As needed Fioricet seems to relieve her headache.  Essential hypertension Restart Toprol 50 mg twice a day and Norvasc 5 mg daily with blood pressure now elevated.  Anxiety and depression Continue psychiatric medications.  Chest pain 2 troponins negative  Obesity (BMI 30-39.9) BMI 31.76.  Chronic indwelling Foley catheter Catheter related urinary tract infection.  Urinalysis  positive on freshly placed catheter (not her existing catheter).  Follow-up urine culture.  Start empiric Rocephin since patient not feeling well.  Pulmonary nodules Follow-up as outpatient.  Patient also has lymphadenopathy in the chest.  Hyperlipidemia Hold statin until feeling better.        Subjective: Patient still feels headache and nausea but states she is eating.  Blood pressure came up today.  Physical Exam: Vitals:   07/22/23 0756 07/22/23 0901 07/22/23 1223 07/22/23 1227  BP: 137/83  (!) 178/92 (!) 158/103  Pulse: 81  89   Resp: 18  20   Temp: 98.1 F (36.7 C)  97.9 F (36.6 C)   TempSrc: Oral  Oral   SpO2: 96% 97%    Weight:      Height:       Physical Exam HENT:     Head: Normocephalic.     Mouth/Throat:     Pharynx: No oropharyngeal exudate.  Eyes:     General: Lids are normal.     Conjunctiva/sclera: Conjunctivae normal.  Cardiovascular:     Rate and Rhythm: Normal rate and regular rhythm.     Heart sounds: Normal heart sounds, S1 normal and S2 normal.  Pulmonary:     Breath sounds: Transmitted upper airway sounds present. Examination of the right-lower field reveals decreased breath sounds. Examination of the left-lower field reveals decreased breath sounds. Decreased breath sounds present. No wheezing, rhonchi or rales.  Abdominal:     Palpations: Abdomen is soft.     Tenderness: There is abdominal tenderness in the epigastric area.  Musculoskeletal:  Right lower leg: No swelling.     Left lower leg: No swelling.  Skin:    General: Skin is warm.     Findings: No rash.  Neurological:     Mental Status: She is alert and oriented to person, place, and time.     Data Reviewed: Potassium 2.7, sodium 132, creatinine 0.62, white blood count 5.8, hemoglobin 10.9 Urinalysis positive follow-up urine culture  Disposition: Status is: Inpatient Remains inpatient appropriate because: Replace potassium IV and orally today  Planned Discharge  Destination: Home    Time spent: 28 minutes Case discussed with nursing staff  Author: Alford Highland, MD 07/22/2023 2:21 PM  For on call review [email or web address].

## 2023-07-23 ENCOUNTER — Inpatient Hospital Stay: Payer: Medicaid Other

## 2023-07-23 DIAGNOSIS — J441 Chronic obstructive pulmonary disease with (acute) exacerbation: Secondary | ICD-10-CM | POA: Diagnosis not present

## 2023-07-23 DIAGNOSIS — R1013 Epigastric pain: Secondary | ICD-10-CM | POA: Diagnosis not present

## 2023-07-23 DIAGNOSIS — E871 Hypo-osmolality and hyponatremia: Secondary | ICD-10-CM | POA: Diagnosis not present

## 2023-07-23 DIAGNOSIS — R110 Nausea: Secondary | ICD-10-CM | POA: Diagnosis not present

## 2023-07-23 LAB — COMPREHENSIVE METABOLIC PANEL
ALT: 30 U/L (ref 0–44)
AST: 24 U/L (ref 15–41)
Albumin: 2.8 g/dL — ABNORMAL LOW (ref 3.5–5.0)
Alkaline Phosphatase: 55 U/L (ref 38–126)
Anion gap: 7 (ref 5–15)
BUN: 8 mg/dL (ref 8–23)
CO2: 27 mmol/L (ref 22–32)
Calcium: 8.6 mg/dL — ABNORMAL LOW (ref 8.9–10.3)
Chloride: 98 mmol/L (ref 98–111)
Creatinine, Ser: 0.49 mg/dL (ref 0.44–1.00)
GFR, Estimated: >60 mL/min (ref >60)
Glucose, Bld: 81 mg/dL (ref 70–99)
Potassium: 4.1 mmol/L (ref 3.5–5.1)
Sodium: 132 mmol/L — ABNORMAL LOW (ref 135–145)
Total Bilirubin: 0.3 mg/dL (ref 0.0–1.2)
Total Protein: 6.2 g/dL — ABNORMAL LOW (ref 6.5–8.1)

## 2023-07-23 LAB — PHOSPHORUS: Phosphorus: 2.8 mg/dL (ref 2.5–4.6)

## 2023-07-23 MED ORDER — LORAZEPAM 2 MG/ML IJ SOLN
0.5000 mg | Freq: Once | INTRAMUSCULAR | Status: AC | PRN
  Administered 2023-07-23: 0.5 mg via INTRAVENOUS
  Filled 2023-07-23: qty 1

## 2023-07-23 MED ORDER — AMLODIPINE BESYLATE 5 MG PO TABS
5.0000 mg | ORAL_TABLET | Freq: Once | ORAL | Status: AC
  Administered 2023-07-23: 5 mg via ORAL
  Filled 2023-07-23: qty 1

## 2023-07-23 MED ORDER — ONDANSETRON HCL 4 MG/2ML IJ SOLN
4.0000 mg | Freq: Four times a day (QID) | INTRAMUSCULAR | Status: DC | PRN
  Administered 2023-07-23 – 2023-07-25 (×6): 4 mg via INTRAVENOUS
  Filled 2023-07-23 (×8): qty 2

## 2023-07-23 MED ORDER — AMLODIPINE BESYLATE 10 MG PO TABS
10.0000 mg | ORAL_TABLET | Freq: Every day | ORAL | Status: DC
  Administered 2023-07-24 – 2023-07-25 (×2): 10 mg via ORAL
  Filled 2023-07-23 (×2): qty 1

## 2023-07-23 MED ORDER — SODIUM CHLORIDE 0.9 % IV SOLN: INTRAVENOUS | Status: DC | PRN

## 2023-07-23 MED ORDER — PREDNISONE 10 MG PO TABS
40.0000 mg | ORAL_TABLET | Freq: Every day | ORAL | Status: DC
  Administered 2023-07-24 – 2023-07-25 (×2): 40 mg via ORAL
  Filled 2023-07-23 (×2): qty 4

## 2023-07-23 NOTE — Plan of Care (Signed)
  Problem: Education: Goal: Knowledge of disease or condition will improve Outcome: Progressing   Problem: Education: Goal: Knowledge of the prescribed therapeutic regimen will improve Outcome: Progressing   Problem: Education: Goal: Individualized Educational Video(s) Outcome: Progressing   Problem: Education: Goal: Individualized Educational Video(s) Outcome: Progressing   Problem: Activity: Goal: Ability to tolerate increased activity will improve Outcome: Progressing   Problem: Respiratory: Goal: Ability to maintain a clear airway will improve Outcome: Progressing   Problem: Activity: Goal: Will verbalize the importance of balancing activity with adequate rest periods Outcome: Progressing   Problem: Respiratory: Goal: Levels of oxygenation will improve Outcome: Progressing   Problem: Respiratory: Goal: Ability to maintain a clear airway will improve Outcome: Progressing   Problem: Respiratory: Goal: Ability to maintain adequate ventilation will improve Outcome: Progressing

## 2023-07-23 NOTE — Progress Notes (Signed)
Progress Note   Patient: Katelyn [name] GNF:[id number] DOB: 12-17-1960 DOA: 07/20/2023     2 DOS: the patient was seen and examined on 07/23/2023   Brief hospital course: 63 y.o. female with medical history significant of COPD, hypertension, GERD, hyperlipidemia, migraine, anxiety she presents to the hospital with feeling sick to her stomach over the last 6 days.  She has had nausea but no vomiting.  She has pain in her right lung.  She is short of breath she has chest pain on the left side without any radiation no sweating no vomiting.  Does have a headache.  Does also have epigastric pain.  Hospitalist services were contacted for COPD exacerbation and hyponatremia.  Patient does take hydrochlorothiazide at home.   1/31.  Patient still not feeling any better still had some nausea and retching.  Still having headache.  Will take out Foley catheter and replace with new Foley catheter so we can send off a urine. 2/1.  Patient still feeling nauseous but is eating.  Today's potassium very low at 2.7.  Still having headache but relieved with Fioricet. 2/2.  Patient still nauseous.  Still having headache.  Urine culture growing 100,000 gram-negative rods.  Assessment and Plan: * Nausea Persistent nausea.  Patient is eating but forcing herself to do so.  CT scan of the head negative.  With persistent nausea to rule out central cause with MRI of the brain.  COPD exacerbation (HCC) Continue Solu-Medrol and nebulizer treatments and Dulera inhaler.  Lungs are clear when she breathes through her nose.  Hyponatremia Sodium 132.  Discontinued hydrochlorothiazide.  Regular diet.  Epigastric pain IV Protonix and as needed nausea medications.  Hypokalemia Replaced  Headache As needed Fioricet seems to help her headache.  Essential hypertension Increase Norvasc to 10 mg daily continue Toprol.  May need to restart clonidine  Anxiety and depression Continue psychiatric medications.  Chest pain 2  troponins negative  Obesity (BMI 30-39.9) BMI 31.76.  Chronic indwelling Foley catheter Catheter related urinary tract infection.  Urinalysis positive on freshly placed catheter (not her existing catheter).  Follow-up urine culture.  Started empiric Rocephin since patient not feeling well.  Pulmonary nodules Follow-up as outpatient.  Patient also has lymphadenopathy in the chest.  Hyperlipidemia Hold statin until feeling better.        Subjective: Patient still not feeling well.  Having headache and nausea.  Admitted with nausea and COPD exacerbation.  Physical Exam: Vitals:   07/23/23 0838 07/23/23 0843 07/23/23 0845 07/23/23 0858  BP: (!) 164/99  (!) 164/96 (!) 168/86  Pulse: 78 70 75 64  Resp:    18  Temp: 97.9 F (36.6 C)     TempSrc: Oral     SpO2:  99% 99%   Weight:      Height:       Physical Exam HENT:     Head: Normocephalic.     Mouth/Throat:     Pharynx: No oropharyngeal exudate.  Eyes:     General: Lids are normal.     Conjunctiva/sclera: Conjunctivae normal.  Cardiovascular:     Rate and Rhythm: Normal rate and regular rhythm.     Heart sounds: Normal heart sounds, S1 normal and S2 normal.  Pulmonary:     Breath sounds: Transmitted upper airway sounds present. No decreased breath sounds, wheezing, rhonchi or rales.     Comments: Lungs clear when she breathes through her nose.  When she breathes through her mouth hear a lot of upper  airway congestion. Abdominal:     Palpations: Abdomen is soft.     Tenderness: There is abdominal tenderness in the epigastric area.  Musculoskeletal:     Right lower leg: No swelling.     Left lower leg: No swelling.  Skin:    General: Skin is warm.     Findings: No rash.  Neurological:     Mental Status: She is alert and oriented to person, place, and time.     Data Reviewed: Urine culture greater than 100,000 gram-negative rods Sodium 132, potassium 4.1, creatinine 0.49   Disposition: Status is:  Inpatient Remains inpatient appropriate because: With persistent nausea and headache we will get an MRI of the brain to rule out central causes.  Continue antibiotic for urinary infection.  Planned Discharge Destination: Home    Time spent: 27 minutes  Author: Alford Highland, MD 07/23/2023 1:19 PM  For on call review [email or web address].

## 2023-07-24 DIAGNOSIS — R110 Nausea: Secondary | ICD-10-CM | POA: Diagnosis not present

## 2023-07-24 DIAGNOSIS — R1013 Epigastric pain: Secondary | ICD-10-CM | POA: Diagnosis not present

## 2023-07-24 DIAGNOSIS — J441 Chronic obstructive pulmonary disease with (acute) exacerbation: Secondary | ICD-10-CM | POA: Diagnosis not present

## 2023-07-24 DIAGNOSIS — E871 Hypo-osmolality and hyponatremia: Secondary | ICD-10-CM | POA: Diagnosis not present

## 2023-07-24 LAB — URINE CULTURE
Culture: >=100000 — AB
Special Requests: NORMAL

## 2023-07-24 MED ORDER — SODIUM CHLORIDE 0.9 % IV SOLN: INTRAVENOUS | Status: DC | PRN

## 2023-07-24 MED ORDER — METOCLOPRAMIDE HCL 5 MG/ML IJ SOLN
5.0000 mg | Freq: Three times a day (TID) | INTRAMUSCULAR | Status: DC
  Administered 2023-07-24 – 2023-07-25 (×4): 5 mg via INTRAVENOUS
  Filled 2023-07-24 (×5): qty 2

## 2023-07-24 MED ORDER — CLONIDINE HCL 0.1 MG PO TABS
0.1000 mg | ORAL_TABLET | Freq: Every day | ORAL | Status: DC
  Administered 2023-07-24: 0.1 mg via ORAL
  Filled 2023-07-24 (×2): qty 1

## 2023-07-24 MED ORDER — CEFTAZIDIME 1 G IJ SOLR
1.0000 g | Freq: Three times a day (TID) | INTRAMUSCULAR | Status: DC
  Administered 2023-07-24 – 2023-07-25 (×4): 1 g via INTRAVENOUS
  Filled 2023-07-24 (×6): qty 1

## 2023-07-24 MED ORDER — PROMETHAZINE HCL 25 MG PO TABS
25.0000 mg | ORAL_TABLET | Freq: Four times a day (QID) | ORAL | Status: DC | PRN
  Administered 2023-07-24 – 2023-07-25 (×4): 25 mg via ORAL
  Filled 2023-07-24 (×4): qty 1

## 2023-07-24 MED ORDER — SIMETHICONE 80 MG PO CHEW
80.0000 mg | CHEWABLE_TABLET | Freq: Four times a day (QID) | ORAL | Status: DC | PRN
  Administered 2023-07-24 – 2023-07-25 (×3): 80 mg via ORAL
  Filled 2023-07-24 (×3): qty 1

## 2023-07-24 NOTE — Progress Notes (Signed)
Progress Note   Patient: Katelyn [name] ZOX:[id number] DOB: February 15, 1961 DOA: 07/20/2023     3 DOS: the patient was seen and examined on 07/24/2023   Brief hospital course: 63 y.o. female with medical history significant of COPD, hypertension, GERD, hyperlipidemia, migraine, anxiety she presents to the hospital with feeling sick to her stomach over the last 6 days.  She has had nausea but no vomiting.  She has pain in her right lung.  She is short of breath she has chest pain on the left side without any radiation no sweating no vomiting.  Does have a headache.  Does also have epigastric pain.  Hospitalist services were contacted for COPD exacerbation and hyponatremia.  Patient does take hydrochlorothiazide at home.   1/31.  Patient still not feeling any better still had some nausea and retching.  Still having headache.  Will take out Foley catheter and replace with new Foley catheter so we can send off a urine. 2/1.  Patient still feeling nauseous but is eating.  Today's potassium very low at 2.7.  Still having headache but relieved with Fioricet. 2/2.  Patient still nauseous.  Still having headache.  Urine culture growing 100,000 gram-negative rods.  MRI brain negative 2/3.  Urine culture growing Pseudomonas.  Switched antibiotics to [employer] [facility].  Trial of Reglan since still nauseous.  Assessment and Plan: * Nausea Persistent nausea.  Patient is eating but forcing herself to do so.  CT scan of the head negative.  MRI of the brain negative.  Will try Reglan IV.  Change Phenergan to p.o.  COPD exacerbation (HCC) Continue prednisone and nebulizer treatments and Dulera inhaler.  Lungs are clear when she breathes through her nose.  Hyponatremia Sodium 132.  Discontinued hydrochlorothiazide.  Regular diet.  Epigastric pain IV Protonix while here.  Patient states that she cannot go under anesthesia secondary to her lungs.  As needed nausea medications.  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  Urinalysis positive on freshly placed catheter (not her existing catheter).  Switched antibiotics over to [employer].  Will give fosfomycin prior to discharge.  Headache As needed Fioricet seems to help her headache.  Hypokalemia Replaced  Essential hypertension Continue Norvasc and Toprol.  Restarted clonidine.  Anxiety and depression Continue psychiatric medications.  Chest pain 2 troponins negative  Obesity (BMI 30-39.9) BMI 31.76.  Pulmonary nodules Follow-up as outpatient.  Patient also has lymphadenopathy in the chest.  Hyperlipidemia Hold statin until feeling better.        Subjective: Patient still nauseous and not feeling well.  Did not eat much this morning.  Antibiotics switched over to [facility] with Pseudomonas growing in the urine culture.  Physical Exam: Vitals:   07/24/23 0600 07/24/23 0758 07/24/23 0812 07/24/23 1125  BP: (!) 140/82 (!) 188/89 (!) 166/91 (!) 159/92  Pulse: 65 67 65 80  Resp: 18 20  20   Temp: 97.8 F (36.6 C) 98 F (36.7 C)  98.5 F (36.9 C)  TempSrc: Oral Oral  Oral  SpO2: 95% 96%  98%  Weight:      Height:       Physical Exam HENT:     Head: Normocephalic.     Mouth/Throat:     Pharynx: No oropharyngeal exudate.  Eyes:     General: Lids are normal.     Conjunctiva/sclera: Conjunctivae normal.  Cardiovascular:     Rate and Rhythm: Normal rate and regular rhythm.     Heart sounds: Normal heart sounds,  S1 normal and S2 normal.  Pulmonary:     Breath sounds: Transmitted upper airway sounds present. No decreased breath sounds, wheezing, rhonchi or rales.     Comments: Lungs clear when she breathes through her nose.  Abdominal:     Palpations: Abdomen is soft.     Tenderness: There is abdominal tenderness in the epigastric area.  Musculoskeletal:     Right lower leg: No swelling.     Left lower leg: No swelling.  Skin:    General: Skin is warm.     Findings: No rash.   Neurological:     Mental Status: She is alert and oriented to person, place, and time.     Data Reviewed: Last sodium 132  Disposition: Status is: Inpatient Remains inpatient appropriate because: Switch antibiotics over to [facility] with Pseudomonas growing in the urine.  Will switch over to fosfomycin prior to discharge.  Planned Discharge Destination: Home    Time spent: 28 minutes  Author: Alford Highland, MD 07/24/2023 1:58 PM  For on call review [email or web address].

## 2023-07-24 NOTE — TOC CM/SW Note (Signed)
Transition of Care [facility]) - Inpatient Brief Assessment   Patient Details  Name: Katelyn [name] MRN: [id number] Date of Birth: [birth year]  Transition of Care [facility]) CM/SW Contact:    Chapman Fitch, RN Phone Number: 07/24/2023, 9:24 AM   Clinical Narrative:   Transition of Care [facility]) Screening Note   Patient Details  Name: Katelyn [name] Date of Birth: [birth year]   Transition of Care [facility]) CM/SW Contact:    Chapman Fitch, RN Phone Number: 07/24/2023, 9:25 AM    Transition of Care Department [facility]) has reviewed patient and no TOC needs have been identified at this time.  If new patient transition needs arise, please place a TOC consult.  Previous admission patient was set up with [facility], however she was not opened due to needing to see urologist first which was scheduled 2/9  PT this admission says no PT follow up indicated   Transition of Care Asessment: Insurance and Status: Insurance coverage has been reviewed Patient has primary care physician: Yes     Prior/Current Home Services: No current home services Social Drivers of Health Review: SDOH reviewed no interventions necessary Readmission risk has been reviewed: Yes Transition of care needs: no transition of care needs at this time

## 2023-07-24 NOTE — Progress Notes (Signed)
PIV consult: RUE assessed w [place]. Veins are small. Long 22g placed. Pt reports she is able to keep pills down. Recommend PO phenergan if otherwise appropriate. Message to RN informing her of same.

## 2023-07-25 ENCOUNTER — Ambulatory Visit: Payer: Medicaid Other | Admitting: Family Medicine

## 2023-07-25 DIAGNOSIS — R110 Nausea: Secondary | ICD-10-CM | POA: Diagnosis not present

## 2023-07-25 DIAGNOSIS — E871 Hypo-osmolality and hyponatremia: Secondary | ICD-10-CM | POA: Diagnosis not present

## 2023-07-25 DIAGNOSIS — R1013 Epigastric pain: Secondary | ICD-10-CM | POA: Diagnosis not present

## 2023-07-25 DIAGNOSIS — J441 Chronic obstructive pulmonary disease with (acute) exacerbation: Secondary | ICD-10-CM | POA: Diagnosis not present

## 2023-07-25 LAB — COMPREHENSIVE METABOLIC PANEL
ALT: 33 U/L (ref 0–44)
AST: 26 U/L (ref 15–41)
Albumin: 2.9 g/dL — ABNORMAL LOW (ref 3.5–5.0)
Alkaline Phosphatase: 53 U/L (ref 38–126)
Anion gap: 8 (ref 5–15)
BUN: 13 mg/dL (ref 8–23)
CO2: 26 mmol/L (ref 22–32)
Calcium: 8.4 mg/dL — ABNORMAL LOW (ref 8.9–10.3)
Chloride: 96 mmol/L — ABNORMAL LOW (ref 98–111)
Creatinine, Ser: 0.68 mg/dL (ref 0.44–1.00)
GFR, Estimated: >60 mL/min (ref >60)
Glucose, Bld: 89 mg/dL (ref 70–99)
Potassium: 4.1 mmol/L (ref 3.5–5.1)
Sodium: 130 mmol/L — ABNORMAL LOW (ref 135–145)
Total Bilirubin: 0.2 mg/dL (ref 0.0–1.2)
Total Protein: 6.4 g/dL — ABNORMAL LOW (ref 6.5–8.1)

## 2023-07-25 LAB — CBC
HCT: 33.6 % — ABNORMAL LOW (ref 36.0–46.0)
Hemoglobin: 11.5 g/dL — ABNORMAL LOW (ref 12.0–15.0)
MCH: 30 pg (ref 26.0–34.0)
MCHC: 34.2 g/dL (ref 30.0–36.0)
MCV: 87.7 fL (ref 80.0–100.0)
Platelets: 200 10*3/uL (ref 150–400)
RBC: 3.83 MIL/uL — ABNORMAL LOW (ref 3.87–5.11)
RDW: 13.5 % (ref 11.5–15.5)
WBC: 4.6 10*3/uL (ref 4.0–10.5)
nRBC: 0 % (ref 0.0–0.2)

## 2023-07-25 MED ORDER — ONDANSETRON HCL 4 MG PO TABS
4.0000 mg | ORAL_TABLET | Freq: Three times a day (TID) | ORAL | Status: DC | PRN
  Administered 2023-07-25: 4 mg via ORAL
  Filled 2023-07-25: qty 1

## 2023-07-25 MED ORDER — SIMETHICONE 80 MG PO CHEW: 80.0000 mg | CHEWABLE_TABLET | Freq: Four times a day (QID) | ORAL | 0 refills | Status: AC | PRN

## 2023-07-25 MED ORDER — FOSFOMYCIN TROMETHAMINE 3 G PO PACK: 3.0000 g | PACK | Freq: Once | ORAL | 0 refills | Status: AC

## 2023-07-25 MED ORDER — METOCLOPRAMIDE HCL 10 MG PO TABS: 5.0000 mg | ORAL_TABLET | Freq: Three times a day (TID) | ORAL | Status: DC

## 2023-07-25 MED ORDER — DOCUSATE SODIUM 100 MG PO CAPS: 200.0000 mg | ORAL_CAPSULE | Freq: Two times a day (BID) | ORAL | 0 refills | Status: AC

## 2023-07-25 MED ORDER — FOSFOMYCIN TROMETHAMINE 3 G PO PACK
3.0000 g | PACK | Freq: Once | ORAL | Status: AC
Start: 2023-07-25 — End: 2023-07-25
  Administered 2023-07-25: 3 g via ORAL
  Filled 2023-07-25: qty 3

## 2023-07-25 MED ORDER — METOCLOPRAMIDE HCL 10 MG PO TABS
5.0000 mg | ORAL_TABLET | Freq: Three times a day (TID) | ORAL | Status: DC
Start: 2023-07-25 — End: 2023-07-25
  Administered 2023-07-25: 5 mg via ORAL
  Filled 2023-07-25: qty 1

## 2023-07-25 MED ORDER — METOCLOPRAMIDE HCL 5 MG PO TABS: 5.0000 mg | ORAL_TABLET | Freq: Three times a day (TID) | ORAL | 0 refills | Status: DC

## 2023-07-25 MED ORDER — POLYETHYLENE GLYCOL 3350 17 G PO PACK
17.0000 g | PACK | Freq: Every day | ORAL | Status: DC
Start: 2023-07-25 — End: 2023-07-25
  Administered 2023-07-25: 17 g via ORAL
  Filled 2023-07-25: qty 1

## 2023-07-25 MED ORDER — CLONIDINE HCL 0.1 MG PO TABS
0.2000 mg | ORAL_TABLET | Freq: Every day | ORAL | Status: DC
Start: 2023-07-25 — End: 2023-07-25
  Administered 2023-07-25: 0.2 mg via ORAL
  Filled 2023-07-25: qty 2

## 2023-07-25 MED ORDER — PROMETHAZINE HCL 25 MG PO TABS: 25.0000 mg | ORAL_TABLET | Freq: Four times a day (QID) | ORAL | 0 refills | Status: DC | PRN

## 2023-07-25 MED ORDER — POLYETHYLENE GLYCOL 3350 17 G PO PACK: 17.0000 g | PACK | Freq: Every day | ORAL | 0 refills | Status: AC

## 2023-07-25 MED ORDER — GABAPENTIN 300 MG PO CAPS: 600.0000 mg | ORAL_CAPSULE | Freq: Two times a day (BID) | ORAL | Status: DC

## 2023-07-25 MED ORDER — CLONIDINE HCL 0.2 MG PO TABS: 0.2000 mg | ORAL_TABLET | Freq: Every day | ORAL | 0 refills | Status: DC

## 2023-07-25 NOTE — Plan of Care (Signed)

## 2023-07-25 NOTE — Progress Notes (Signed)
Pt discharged home.  Discharge instructions, prescriptions and follow up appointment given to and reviewed with pt.  Pt verbalized understanding.  Escorted by auxillary. 

## 2023-07-25 NOTE — Discharge Summary (Signed)
 Physician Discharge Summary   Patient: Katelyn [name] MRN: [id number] DOB: [birth year]  Admit date:     07/20/2023  Discharge date: 07/25/23  Discharge Physician: Charlie Patterson   PCP: Ziglar, Susan K, MD   Recommendations at discharge:   Follow-up PCP 5 days  Discharge Diagnoses: Principal Problem:   Nausea Active Problems:   COPD exacerbation (HCC)   Hyponatremia   Epigastric pain   Chronic indwelling Foley catheter   Hypokalemia   Headache   Essential hypertension   Anxiety and depression   Chest pain   Hyperlipidemia   Pulmonary nodules   Obesity (BMI 30-39.9)    Hospital Course: 63 y.o. female with medical history significant of COPD, hypertension, GERD, hyperlipidemia, migraine, anxiety she presents to the hospital with feeling sick to her stomach over the last 6 days.  She has had nausea but no vomiting.  She has pain in her right lung.  She is short of breath she has chest pain on the left side without any radiation no sweating no vomiting.  Does have a headache.  Does also have epigastric pain.  Hospitalist services were contacted for COPD exacerbation and hyponatremia.  Patient does take hydrochlorothiazide  at home.   1/31.  Patient still not feeling any better still had some nausea and retching.  Still having headache.  Will take out Foley catheter and replace with new Foley catheter so we can send off a urine. 2/1.  Patient still feeling nauseous but is eating.  Today's potassium very low at 2.7.  Still having headache but relieved with Fioricet . 2/2.  Patient still nauseous.  Still having headache.  Urine culture growing 100,000 gram-negative rods.  MRI brain negative 2/3.  Urine culture growing Pseudomonas.  Switched antibiotics to Fortaz .  Trial of Reglan  since still nauseous. 2/4.  Patient still complaining of nausea but able to tolerate food without vomiting.  Prescribed Reglan  and Phenergan  to go home with.  For her urinary tract infection did prescribe  fosfomycin prior to going home and will give 1 more dose of fosfomycin in 3 days.  Assessment and Plan: * Nausea Persistent nausea.  Patient is eating without vomiting..  CT scan of the head negative.  MRI of the brain negative.  Prescribed Reglan  and Phenergan  to go home with.  COPD exacerbation (HCC) Completed steroids while here.  Hyponatremia Sodium 130.  Discontinued hydrochlorothiazide .  Regular diet.  Epigastric pain IV Protonix  while here.  Patient states that she cannot go under anesthesia secondary to her lungs.  As needed nausea medications.  Patient has Nexium  at home.  Chronic indwelling Foley catheter Catheter related urinary tract infection with Pseudomonas.  Urinalysis positive on freshly placed catheter (not her existing catheter).  Switched antibiotics over to Fortaz .  Fosfomycin given today and will repeat in 3 days.  Headache As needed Fioricet  seems to help her headache.  Watch for medication overuse as outpatient.  Hypokalemia Replaced  Essential hypertension Continue Norvasc  clonidine  and Toprol .  Blood pressure was initially low upon coming in.  Anxiety and depression Continue psychiatric medications.  Chest pain 2 troponins negative  Obesity (BMI 30-39.9) BMI 31.76.  Pulmonary nodules Follow-up as outpatient.  Patient also has lymphadenopathy in the chest.  Consider referral to pulmonary as outpatient.  Hyperlipidemia Held statin in the hospital.         Consultants: None Procedures performed: None Disposition: Home Diet recommendation:  Regular diet DISCHARGE MEDICATION: Allergies as of 07/25/2023       Reactions   Ciprofloxacin  Other (See Comments)   Headache   Doxycycline  Nausea And Vomiting   Macrobid [nitrofurantoin Monohyd Macro] Itching   Sulfa Antibiotics Itching        Medication List     STOP taking these medications    buPROPion  150 MG 12 hr tablet Commonly known as: Wellbutrin  SR   hydrochlorothiazide  25 MG  tablet Commonly known as: HYDRODIURIL        TAKE these medications    albuterol  108 (90 Base) MCG/ACT inhaler Commonly known as: VENTOLIN  HFA Inhale 2 puffs into the lungs every 6 (six) hours as needed for wheezing or shortness of breath.   ALPRAZolam  1 MG tablet Commonly known as: XANAX  Take 1 tablet (1 mg total) by mouth 3 (three) times daily.   amLODipine  10 MG tablet Commonly known as: NORVASC  Take 1 tablet (10 mg total) by mouth daily.   atorvastatin  40 MG tablet Commonly known as: LIPITOR Take 40 mg by mouth daily.   butalbital -acetaminophen -caffeine  50-325-40 MG tablet Commonly known as: FIORICET  Take 1 tablet by mouth every 6 (six) hours as needed for headache or migraine.   cloNIDine  0.2 MG tablet Commonly known as: CATAPRES  Take 1 tablet (0.2 mg total) by mouth daily. Start taking on: July 26, 2023 What changed:  medication strength how much to take   dicyclomine  10 MG capsule Commonly known as: BENTYL  Take 10 mg by mouth 3 (three) times daily as needed for spasms.   docusate sodium  100 MG capsule Commonly known as: COLACE Take 2 capsules (200 mg total) by mouth 2 (two) times daily.   esomeprazole  40 MG capsule Commonly known as: NEXIUM  Take 40 mg by mouth daily.   fluticasone  220 MCG/ACT inhaler Commonly known as: Flovent  HFA Inhale 1 puff into the lungs in the morning and at bedtime. What changed:  when to take this reasons to take this   fosfomycin 3 g Pack Commonly known as: MONUROL  Take 3 g by mouth once for 1 dose. Start taking on: July 28, 2023   gabapentin  300 MG capsule Commonly known as: Neurontin  Take 2 capsules (600 mg total) by mouth 2 (two) times daily.   metoCLOPramide  5 MG tablet Commonly known as: REGLAN  Take 1 tablet (5 mg total) by mouth 4 (four) times daily -  before meals and at bedtime.   metoprolol  succinate 100 MG 24 hr tablet Commonly known as: TOPROL -XL Take 50 mg by mouth 2 (two) times daily.    polyethylene glycol 17 g packet Commonly known as: MIRALAX  / GLYCOLAX  Take 17 g by mouth daily. Start taking on: July 26, 2023   promethazine  25 MG tablet Commonly known as: PHENERGAN  Take 1 tablet (25 mg total) by mouth every 6 (six) hours as needed for nausea or vomiting.   QUEtiapine  100 MG tablet Commonly known as: SEROQUEL  Take 100 mg by mouth daily.   simethicone  80 MG chewable tablet Commonly known as: MYLICON Chew 1 tablet (80 mg total) by mouth 4 (four) times daily as needed (gas).   Vitamin D  (Ergocalciferol ) 1.25 MG (50000 UNIT) Caps capsule Commonly known as: DRISDOL  Take 50,000 Units by mouth once a week. Monday        Follow-up Information     Ziglar, Susan K, MD Follow up in 5 day(s).   Specialty: Family Medicine Contact information: [address] [place] [address] [phone]                Discharge Exam: Fredricka Weights   07/20/23 1107  Weight: 83.9 kg  Physical Exam HENT:     Head: Normocephalic.     Mouth/Throat:     Pharynx: No oropharyngeal exudate.  Eyes:     General: Lids are normal.     Conjunctiva/sclera: Conjunctivae normal.  Cardiovascular:     Rate and Rhythm: Normal rate and regular rhythm.     Heart sounds: Normal heart sounds, S1 normal and S2 normal.  Pulmonary:     Breath sounds: No decreased breath sounds, wheezing, rhonchi or rales.     Comments: Lungs clear when she breathes through her nose.  Abdominal:     Palpations: Abdomen is soft.     Tenderness: There is abdominal tenderness in the epigastric area.  Musculoskeletal:     Right lower leg: No swelling.     Left lower leg: No swelling.  Skin:    General: Skin is warm.     Findings: No rash.  Neurological:     Mental Status: She is alert and oriented to person, place, and time.      Condition at discharge: stable  The results of significant diagnostics from this hospitalization (including imaging, microbiology, ancillary and laboratory) are  listed below for reference.   Imaging Studies: MR BRAIN WO CONTRAST Result Date: 07/23/2023 CLINICAL DATA:  Headache EXAM: MRI HEAD WITHOUT CONTRAST TECHNIQUE: Multiplanar, multiecho pulse sequences of the brain and surrounding structures were obtained without intravenous contrast. COMPARISON:  07/09/2022 FINDINGS: Brain: No acute infarct, mass effect or extra-axial collection. No acute or chronic hemorrhage. Normal white matter signal, parenchymal volume and CSF spaces. The midline structures are normal. Vascular: Normal flow voids. Skull and upper cervical spine: Normal calvarium and skull base. Visualized upper cervical spine and soft tissues are normal. Sinuses/Orbits:No paranasal sinus fluid levels or advanced mucosal thickening. No mastoid or middle ear effusion. Normal orbits. IMPRESSION: Normal brain MRI. Electronically Signed   By: Franky Stanford M.D.   On: 07/23/2023 19:05   CT HEAD WO CONTRAST ( ) Result Date: 07/21/2023 CLINICAL DATA:  Headache, increasing frequency or severity. EXAM: CT HEAD WITHOUT CONTRAST TECHNIQUE: Contiguous axial images were obtained from the base of the skull through the vertex without intravenous contrast. RADIATION DOSE REDUCTION: This exam was performed according to the departmental dose-optimization program which includes automated exposure control, adjustment of the mA and/or kV according to patient size and/or use of iterative reconstruction technique. COMPARISON:  Head CT 07/02/2022 and MRI 07/09/2022 FINDINGS: Brain: There is no evidence of an acute infarct, intracranial hemorrhage, mass, midline shift, or extra-axial fluid collection. Cerebral volume is normal. The ventricles are normal in size. Vascular: Calcified atherosclerosis at the skull base. No hyperdense vessel. Skull: No acute fracture or suspicious osseous lesion. Sinuses/Orbits: Visualized paranasal sinuses and mastoid air cells are clear. Unremarkable orbits. Other: Partially visualized posterior  cervical spinal fusion. IMPRESSION: Unremarkable CT appearance of the brain. Electronically Signed   By: Dasie Hamburg M.D.   On: 07/21/2023 14:10   CT Angio Chest PE W and/or Wo Contrast Result Date: 07/20/2023 CLINICAL DATA:  Pulmonary embolism (PE) suspected, high prob. Chest pain. Dizziness. Shortness of breath. EXAM: CT ANGIOGRAPHY CHEST WITH CONTRAST TECHNIQUE: Multidetector CT imaging of the chest was performed using the standard protocol during bolus administration of intravenous contrast. Multiplanar CT image reconstructions and MIPs were obtained to evaluate the vascular anatomy. RADIATION DOSE REDUCTION: This exam was performed according to the departmental dose-optimization program which includes automated exposure control, adjustment of the mA and/or kV according to patient size and/or use of iterative reconstruction technique.  CONTRAST:  OMNIPAQUE  IOHEXOL  350 MG/ML SOLN COMPARISON:  CT angio chest from 02/11/2023. FINDINGS: Cardiovascular: No evidence of embolism to the proximal subsegmental pulmonary artery level. Normal cardiac size. No pericardial effusion. No aortic aneurysm. There are coronary artery calcifications, in keeping with coronary artery disease. There are also mild peripheral atherosclerotic vascular calcifications of thoracic aorta and its major branches. Mediastinum/Nodes: Visualized thyroid  gland appears grossly unremarkable. No solid / cystic mediastinal masses. The esophagus is nondistended precluding optimal assessment. Note is made of small sliding hiatal hernia. Redemonstration of multiple enlarged mediastinal and hilar lymph nodes, grossly similar to the prior study. For example, prevascular lymph node measures 1.6 x 2.4 cm. Subcarinal lymph node measures 2.2 x 2.8 cm. No axillary lymphadenopathy by size criteria. Lungs/Pleura: The central tracheo-bronchial tree is patent. Mild-to-moderate upper lobe predominant centrilobular emphysema noted. There are patchy areas of  linear, plate-like atelectasis and/or scarring throughout bilateral lungs. No mass or consolidation. No pleural effusion or pneumothorax. There is a 4.6 x 5.8 mm solid noncalcified nodule in the right upper lobe, which is new since the prior study (series 6, image 62). There is a sub 4 mm, noncalcified nodule in the right upper lobe (series 6, image 45), which is similar to the prior study. Upper Abdomen: Surgically absent gallbladder. Remaining visualized upper abdominal viscera within normal limits. Musculoskeletal: The visualized soft tissues of the chest wall are grossly unremarkable. No suspicious osseous lesions. There are mild multilevel degenerative changes in the visualized spine. Review of the MIP images confirms the above findings. IMPRESSION: 1. No embolism to the proximal subsegmental pulmonary artery level. 2. Redemonstration of multiple enlarged mediastinal and bilateral hilar lymph nodes without significant interval change. Correlate clinically. Consider tissue sampling, as clinically indicated. 3. There is a new, sub 6 mm solid noncalcified nodule in the right upper lobe, when compared to the prior exam from 02/11/2023. Consider follow-up exam in 6 months to document growth pattern. 4. Multiple other nonacute observations, as described above. Aortic Atherosclerosis (ICD10-I70.0) and Emphysema (ICD10-J43.9). Electronically Signed   By: Ree Molt M.D.   On: 07/20/2023 16:50   CT ABDOMEN PELVIS W CONTRAST Result Date: 07/20/2023 CLINICAL DATA:  Abdominal pain, acute, nonlocalized EXAM: CT ABDOMEN AND PELVIS WITH CONTRAST TECHNIQUE: Multidetector CT imaging of the abdomen and pelvis was performed using the standard protocol following bolus administration of intravenous contrast. RADIATION DOSE REDUCTION: This exam was performed according to the departmental dose-optimization program which includes automated exposure control, adjustment of the mA and/or kV according to patient size and/or use of  iterative reconstruction technique. CONTRAST:  OMNIPAQUE  IOHEXOL  350 MG/ML SOLN COMPARISON:  CT abdomen/pelvis dated July 06, 2022. FINDINGS: Lower chest: Please refer to the same-day CTA of the chest for description of intrathoracic findings. Hepatobiliary: No focal liver abnormality is seen. Status post cholecystectomy. No biliary dilatation. Pancreas: Unremarkable. No pancreatic ductal dilatation or surrounding inflammatory changes. Spleen: Normal in size without focal abnormality. Adrenals/Urinary Tract: The adrenal glands are unremarkable. Stable subcentimeter focal hypodensity at the inferior pole of the left kidney, too small to definitively characterize. Similar mild right pelvocalyceal prominence. No renal calculi. No left-sided hydronephrosis. Bladder is decompressed with Foley catheter in place. Stomach/Bowel: Small hiatal hernia, unchanged. Appendix appears normal. No evidence of bowel wall thickening, distention, or inflammatory changes. Vascular/Lymphatic: The abdominal aorta is normal in caliber. Aortic atherosclerosis. No enlarged abdominal or pelvic lymph nodes. Reproductive: Status post hysterectomy. No adnexal masses. Other: No abdominopelvic ascites. No intraperitoneal free air. No abdominal wall  hernia. Musculoskeletal: No acute osseous abnormality. No suspicious osseous lesion. Redemonstrated grade 1 anterolisthesis of L4 on L5. IMPRESSION: 1. No acute localizing findings in the abdomen or pelvis. 2. Similar mild right pelvocalyceal prominence. No urolithiasis. 3. Small hiatal hernia, unchanged. 4.  Aortic Atherosclerosis (ICD10-I70.0). Electronically Signed   By: Harrietta Sherry M.D.   On: 07/20/2023 16:33   DG Chest 2 View Result Date: 07/20/2023 CLINICAL DATA:  Chest pain and nausea for past 6 days. Note is made on prior CT 02/11/2023 mediastinal and right hilar lymphadenopathy was seen with question of sarcoidosis or lymphoproliferative disorder. EXAM: CHEST - 2 VIEW  COMPARISON:  Chest radiographs 02/11/2023 and 07/14/2022 FINDINGS: Cardiac silhouette is unchanged and within normal size limits. There is again prominence of the right hilum, unchanged from 02/11/2023 radiographs and corresponding to the right hilar lymphadenopathy seen on 02/11/2023 CT. There is lucency within the upper lungs consistent with the chronic centrilobular emphysematous changes seen previously. No acute airspace opacity. No pleural effusion or pneumothorax. There is anterior and posterior cervical spine fusion hardware overlying the lower cervical spine. IMPRESSION: 1. No acute cardiopulmonary process. 2. Chronic centrilobular emphysematous changes. 3. Unchanged prominence of the right hilum, corresponding to the right hilar lymphadenopathy seen on 02/11/2023 CT. Electronically Signed   By: Tanda Lyons M.D.   On: 07/20/2023 12:10    Microbiology: Results for orders placed or performed during the hospital encounter of 07/20/23  Resp panel by RT-PCR (RSV, Flu A&B, Covid) Anterior Nasal Swab     Status: None   Collection Time: 07/20/23 11:13 AM   Specimen: Anterior Nasal Swab  Result Value Ref Range Status   SARS Coronavirus 2 by RT PCR NEGATIVE NEGATIVE Final    Comment: (NOTE) SARS-CoV-2 target nucleic acids are NOT DETECTED.  The SARS-CoV-2 RNA is generally detectable in upper respiratory specimens during the acute phase of infection. The lowest concentration of SARS-CoV-2 viral copies this assay can detect is 138 copies/mL. A negative result does not preclude SARS-Cov-2 infection and should not be used as the sole basis for treatment or other patient management decisions. A negative result may occur with  improper specimen collection/handling, submission of specimen other than nasopharyngeal swab, presence of viral mutation(s) within the areas targeted by this assay, and inadequate number of viral copies(<138 copies/mL). A negative result must be combined with clinical  observations, patient history, and epidemiological information. The expected result is Negative.  Fact Sheet for Patients:  bloggercourse.com  Fact Sheet for Healthcare Providers:  seriousbroker.it  This test is no t yet approved or cleared by the [place]  FDA and  has been authorized for detection and/or diagnosis of SARS-CoV-2 by FDA under an Emergency Use Authorization (EUA). This EUA will remain  in effect (meaning this test can be used) for the duration of the COVID-19 declaration under Section 564(b)(1) of the Act, 21 U.S.C.section 360bbb-3(b)(1), unless the authorization is terminated  or revoked sooner.       Influenza A by PCR NEGATIVE NEGATIVE Final   Influenza B by PCR NEGATIVE NEGATIVE Final    Comment: (NOTE) The Xpert Xpress SARS-CoV-2/FLU/RSV plus assay is intended as an aid in the diagnosis of influenza from Nasopharyngeal swab specimens and should not be used as a sole basis for treatment. Nasal washings and aspirates are unacceptable for Xpert Xpress SARS-CoV-2/FLU/RSV testing.  Fact Sheet for Patients: bloggercourse.com  Fact Sheet for Healthcare Providers: seriousbroker.it  This test is not yet approved or cleared by the [place]  FDA and has  been authorized for detection and/or diagnosis of SARS-CoV-2 by FDA under an Emergency Use Authorization (EUA). This EUA will remain in effect (meaning this test can be used) for the duration of the COVID-19 declaration under Section 564(b)(1) of the Act, 21 U.S.C. section 360bbb-3(b)(1), unless the authorization is terminated or revoked.     Resp Syncytial Virus by PCR NEGATIVE NEGATIVE Final    Comment: (NOTE) Fact Sheet for Patients: bloggercourse.com  Fact Sheet for Healthcare Providers: seriousbroker.it  This test is not yet approved or cleared by  the [place]  FDA and has been authorized for detection and/or diagnosis of SARS-CoV-2 by FDA under an Emergency Use Authorization (EUA). This EUA will remain in effect (meaning this test can be used) for the duration of the COVID-19 declaration under Section 564(b)(1) of the Act, 21 U.S.C. section 360bbb-3(b)(1), unless the authorization is terminated or revoked.  Performed at [facility], [address] [place], [place] [address]   Respiratory (~20 pathogens) panel by PCR     Status: None   Collection Time: 07/20/23  8:03 PM   Specimen: Nasopharyngeal Swab; Respiratory  Result Value Ref Range Status   Adenovirus NOT DETECTED NOT DETECTED Final   Coronavirus 229E NOT DETECTED NOT DETECTED Final    Comment: (NOTE) The Coronavirus on the Respiratory Panel, DOES NOT test for the novel  Coronavirus (2019 nCoV)    Coronavirus HKU1 NOT DETECTED NOT DETECTED Final   Coronavirus NL63 NOT DETECTED NOT DETECTED Final   Coronavirus OC43 NOT DETECTED NOT DETECTED Final   Metapneumovirus NOT DETECTED NOT DETECTED Final   Rhinovirus / Enterovirus NOT DETECTED NOT DETECTED Final   Influenza A NOT DETECTED NOT DETECTED Final   Influenza B NOT DETECTED NOT DETECTED Final   Parainfluenza Virus 1 NOT DETECTED NOT DETECTED Final   Parainfluenza Virus 2 NOT DETECTED NOT DETECTED Final   Parainfluenza Virus 3 NOT DETECTED NOT DETECTED Final   Parainfluenza Virus 4 NOT DETECTED NOT DETECTED Final   Respiratory Syncytial Virus NOT DETECTED NOT DETECTED Final   Bordetella pertussis NOT DETECTED NOT DETECTED Final   Bordetella Parapertussis NOT DETECTED NOT DETECTED Final   Chlamydophila pneumoniae NOT DETECTED NOT DETECTED Final   Mycoplasma pneumoniae NOT DETECTED NOT DETECTED Final    Comment: Performed at [facility], [address] [place], [place] [address]  Remove urinary catheter to obtain Straight Cath urine culture     Status: Abnormal   Collection Time: 07/21/23  2:40  PM   Specimen: In/Out Cath Urine  Result Value Ref Range Status   Specimen Description   Final    IN/OUT CATH URINE Performed at [facility], [address] [place], [place] [address]    Special Requests   Final    Normal Performed at [facility], [address] [place], [place] [address]    Culture >=100,000 COLONIES/mL PSEUDOMONAS AERUGINOSA (A)  Final   Report Status 07/24/2023 FINAL  Final   Organism ID, Bacteria PSEUDOMONAS AERUGINOSA (A)  Final      Susceptibility   Pseudomonas aeruginosa - MIC*    CEFTAZIDIME  4 SENSITIVE Sensitive     CIPROFLOXACIN <=0.25 SENSITIVE Sensitive     GENTAMICIN 2 SENSITIVE Sensitive     IMIPENEM 2 SENSITIVE Sensitive     PIP/TAZO <=4 SENSITIVE Sensitive ug/mL    CEFEPIME 2 SENSITIVE Sensitive     * >=100,000 COLONIES/mL PSEUDOMONAS AERUGINOSA    Labs: CBC: Recent Labs  Lab 07/20/23 1110 07/21/23 0619 07/22/23 9370  07/25/23 0613  WBC 4.8 7.2 5.8 4.6  NEUTROABS  --   --  3.8  --   HGB 13.2 11.9* 10.9* 11.5*  HCT 37.7 34.0* 31.7* 33.6*  MCV 86.3 86.3 87.1 87.7  PLT 231 199 183 200   Basic Metabolic Panel: Recent Labs  Lab 07/20/23 1110 07/21/23 0619 07/22/23 0629 07/23/23 0557 07/25/23 0613  NA 121* 126* 132* 132* 130*  K 3.5 3.0* 2.7* 4.1 4.1  CL 83* 91* 97* 98 96*  CO2 27 25 27 27 26   GLUCOSE 90 114* 101* 81 89  BUN 7* 9 10 8 13   CREATININE 0.66 0.58 0.62 0.49 0.68  CALCIUM  8.6* 8.3* 8.3* 8.6* 8.4*  MG 1.8  --  2.2  --   --   PHOS  --   --  2.6 2.8  --    Liver Function Tests: Recent Labs  Lab 07/20/23 1110 07/22/23 0629 07/23/23 0557 07/25/23 0613  AST 32 35 24 26  ALT 24 36 30 33  ALKPHOS 78 62 55 53  BILITOT 1.1 0.4 0.3 0.2  PROT 7.5 6.0* 6.2* 6.4*  ALBUMIN  3.3* 2.8* 2.8* 2.9*   CBG: No results for input(s): GLUCAP in the last 168 hours.  Discharge time spent: greater than 30 minutes.  Signed: Charlie Patterson, MD [facility] 07/25/2023

## 2023-07-26 ENCOUNTER — Telehealth: Payer: Self-pay

## 2023-07-26 NOTE — Transitions of Care (Post Inpatient/ED Visit) (Signed)
   07/26/2023  Name: Katelyn [name] MRN: [id number] DOB: 1961/01/20  Today's TOC FU Call Status:    Attempted to reach the patient regarding the most recent Inpatient/ED visit.  Follow Up Plan: No further outreach attempts will be made at this time. We have been unable to contact the patient.  The patient's provider is not a [facility] practice   Medford Balboa, BSN, RN [place]  VBCI - Population Health RN Care Manager [phone]

## 2023-07-31 ENCOUNTER — Other Ambulatory Visit: Payer: Self-pay | Admitting: Family Medicine

## 2023-07-31 DIAGNOSIS — G43009 Migraine without aura, not intractable, without status migrainosus: Secondary | ICD-10-CM

## 2023-08-02 ENCOUNTER — Telehealth: Payer: Self-pay | Admitting: Physician Assistant

## 2023-08-03 ENCOUNTER — Ambulatory Visit: Payer: Self-pay | Admitting: Physician Assistant

## 2023-08-11 ENCOUNTER — Ambulatory Visit: Payer: Medicaid Other | Admitting: Physician Assistant

## 2023-08-11 DIAGNOSIS — R339 Retention of urine, unspecified: Secondary | ICD-10-CM

## 2023-08-11 DIAGNOSIS — N3289 Other specified disorders of bladder: Secondary | ICD-10-CM

## 2023-08-11 DIAGNOSIS — Z466 Encounter for fitting and adjustment of urinary device: Secondary | ICD-10-CM

## 2023-08-11 MED ORDER — OXYBUTYNIN CHLORIDE ER 10 MG PO TB24: 10.0000 mg | ORAL_TABLET | Freq: Every day | ORAL | 11 refills | Status: AC

## 2023-08-11 NOTE — Progress Notes (Signed)
 Cath Change/ Replacement  Patient is present today for a catheter change due to urinary retention.  8ml of water was removed from the balloon, a 16FR foley cath was removed without difficulty.  Patient was cleaned and prepped in a sterile fashion with betadine. A 16FR Silastic foley cath was replaced into the bladder, no complications were noted. Urine return was noted 30ml and urine was clear in color. The balloon was filled with 10ml of sterile water. A night bag was attached for drainage. Patient tolerated well.    Performed by: Carman Ching, PA-C  Additional notes: She reports bladder spasms, requests oxybutynin XL. [facility]  Follow up: Return in about 4 weeks (around 09/08/2023) for Catheter exchange.

## 2023-08-14 ENCOUNTER — Encounter: Payer: Self-pay | Admitting: Family Medicine

## 2023-08-14 ENCOUNTER — Ambulatory Visit: Payer: Medicaid Other | Admitting: Family Medicine

## 2023-08-14 VITALS — BP 162/100 | HR 85 | Temp 97.5°F | Resp 16 | Ht 64.0 in | Wt 174.8 lb

## 2023-08-14 DIAGNOSIS — J432 Centrilobular emphysema: Secondary | ICD-10-CM

## 2023-08-14 DIAGNOSIS — E871 Hypo-osmolality and hyponatremia: Secondary | ICD-10-CM

## 2023-08-14 DIAGNOSIS — F32A Depression, unspecified: Secondary | ICD-10-CM

## 2023-08-14 DIAGNOSIS — R918 Other nonspecific abnormal finding of lung field: Secondary | ICD-10-CM

## 2023-08-14 DIAGNOSIS — G4486 Cervicogenic headache: Secondary | ICD-10-CM

## 2023-08-14 DIAGNOSIS — Z1211 Encounter for screening for malignant neoplasm of colon: Secondary | ICD-10-CM

## 2023-08-14 DIAGNOSIS — E876 Hypokalemia: Secondary | ICD-10-CM

## 2023-08-14 DIAGNOSIS — I10 Essential (primary) hypertension: Secondary | ICD-10-CM | POA: Diagnosis not present

## 2023-08-14 DIAGNOSIS — F419 Anxiety disorder, unspecified: Secondary | ICD-10-CM

## 2023-08-14 DIAGNOSIS — G43009 Migraine without aura, not intractable, without status migrainosus: Secondary | ICD-10-CM | POA: Diagnosis not present

## 2023-08-14 MED ORDER — BUTALBITAL-APAP-CAFFEINE 50-325-40 MG PO TABS: 1.0000 | ORAL_TABLET | Freq: Four times a day (QID) | ORAL | 0 refills | Status: DC | PRN

## 2023-08-14 MED ORDER — SUMATRIPTAN SUCCINATE 50 MG PO TABS: 50.0000 mg | ORAL_TABLET | ORAL | 0 refills | Status: DC | PRN

## 2023-08-14 NOTE — Progress Notes (Signed)
 Established Patient Office Visit  Subjective   Patient ID: Katelyn [name], female    DOB: May 10, 1961  Age: 63 y.o. MRN: [id number]  Chief Complaint  Patient presents with   Medical Management of Chronic Issues    HPI Delightful 63 yo woman with COPD, pulmonary lymphadenopathy, anxiety and depression, mixed hyperlipidemia, hyponatremia, hypokalemia, indwelling Foley for atonic bladder, hypertension, chronic daily headaches (cervical degenerative disc disease status post laminectomy x 2). Was hospitalized for COPD exacerbation, headache, chest pains and abdominal pain with nausea and discharged on 07/25/2023.  She was found to have hyponatremia and hypokalemia during her hospitalization.  Sodium got down to 121 and potassium Nadar was 2.7.  Her HCTZ was stopped.  Her blood pressure medicine was changed because she was hyponatremic and hypokalemic.  Her HCTZ was stopped.  Clonidine 0.2 mg daily was added to her Norvasc 10 and Toprol XL 100 mg daily. She was thought to have a bladder infection as after changing her Foley she had a positive urine culture for Pseudomonas.  She was initially treated with Elita Quick then switched to fosfomycin.  Did her last dose of fosfomycin 3 days after her discharge.  She had pulmonary lymphadenopathy noted on CT scan and she needs to follow-up with pulmonology. PHQ-9 is 18, GAD-7 is 18.  Discussed referral to psychiatry.  She reports she always feels this bad.  She denies SI and HI. She requested a note stating that her son is her primary caregiver because he is scheduled for jury duty.  He does the cooking, cleaning, takes care of the dogs, does all transportation related things, grocery shops, does the laundry, is emotional support as well.    ROS    Objective:     BP (!) 162/100 (BP Location: Left Arm, Patient Position: Sitting, Cuff Size: Large)   Pulse 85   Temp (!) 97.5 F (36.4 C) (Oral)   Resp 16   Ht 5\' 4"  (1.626 m)   Wt 174 lb 12.8 oz (79.3 kg)    SpO2 94%   BMI 30.00 kg/m    Physical Exam Vitals and nursing note reviewed.  Constitutional:      Appearance: Normal appearance.  HENT:     Head: Normocephalic and atraumatic.  Eyes:     Conjunctiva/sclera: Conjunctivae normal.  Cardiovascular:     Rate and Rhythm: Normal rate and regular rhythm.  Pulmonary:     Effort: Pulmonary effort is normal.     Breath sounds: Normal breath sounds.  Musculoskeletal:     Right lower leg: No edema.     Left lower leg: No edema.  Skin:    General: Skin is warm and dry.  Neurological:     Mental Status: She is alert and oriented to person, place, and time.  Psychiatric:        Mood and Affect: Mood normal.        Behavior: Behavior normal.        Thought Content: Thought content normal.        Judgment: Judgment normal.      Results for orders placed or performed in visit on 08/14/23  Cologuard  Result Value Ref Range   Cologuard Negative Negative      The 10-year ASCVD risk score (Arnett DK, et al., 2019) is: 17.2%    Assessment & Plan:   Problem List Items Addressed This Visit       Cardiovascular and Mediastinum   Hypertension   Currently on Clonidine 0.1mg  daily,  Metoprolol succinate 100mg  and amlodipine 10mg  daily.  Also has white coat hypertension.        Migraine without aura and without status migrainosus, not intractable   Had Cervical surgery times two.  Daily Has.  Might benefit from Botox injections.  Trial of imitrex per her insurance company requirements      Relevant Medications   butalbital-acetaminophen-caffeine (FIORICET) 50-325-40 MG tablet   SUMAtriptan (IMITREX) 50 MG tablet   Other Relevant Orders   Ambulatory referral to Neurology     Respiratory   COPD (chronic obstructive pulmonary disease) (HCC)   Lung fields are clear on exam she is back to her baseline.  She is on albuterol as needed      Indeterminate pulmonary nodules   Referral to Pulmonology for pulmonary lymphadenopathy       Relevant Orders   Ambulatory referral to Pulmonology     Other   Colon cancer screening   Had a Cologuard 07/08/2021 that was negative.      Hypokalemia   Checking potassium today      Hyponatremia - Primary   Checking sodium today.  She is off HCTZ and is taking clonidine 0.2 mg once daily.      Relevant Orders   CMP14+EGFR   Anxiety and depression   PHQ-9 is 18 and GAD-7 is 18.  Referral to psychiatry.  Denies SI and HI.      Headache   Has degenerative disc disease of the cervical spine and is status post cervical laminectomy x 2.  Wonder if she would benefit from Botox injections.  She is hesitant.  Will try Nurtec as insurance allows.  Requires trial of Imitrex first.  Takes Fioricet 2 at a time.  Advised 30 Fioricet need to last 30 days.      Relevant Medications   butalbital-acetaminophen-caffeine (FIORICET) 50-325-40 MG tablet   SUMAtriptan (IMITREX) 50 MG tablet   Other Visit Diagnoses       Anxiety       Relevant Orders   Ambulatory referral to Psychiatry       Return in about 3 months (around 11/11/2023).    Alease Medina, MD

## 2023-08-14 NOTE — Assessment & Plan Note (Signed)
 Had a Cologuard 07/08/2021 that was negative.

## 2023-08-14 NOTE — Assessment & Plan Note (Addendum)
 Checking sodium today.  She is off HCTZ and is taking clonidine 0.2 mg once daily.

## 2023-08-14 NOTE — Assessment & Plan Note (Signed)
 Lung fields are clear on exam she is back to her baseline.  She is on albuterol as needed

## 2023-08-14 NOTE — Assessment & Plan Note (Addendum)
 Had Cervical surgery times two.  Daily Has.  Might benefit from Botox injections.  Trial of imitrex per her insurance company requirements

## 2023-08-14 NOTE — Assessment & Plan Note (Signed)
 PHQ-9 is 18 and GAD-7 is 18.  Referral to psychiatry.  Denies SI and HI.

## 2023-08-14 NOTE — Assessment & Plan Note (Signed)
 Currently on Clonidine 0.1mg  daily, Metoprolol succinate 100mg  and amlodipine 10mg  daily.  Also has white coat hypertension.

## 2023-08-14 NOTE — Assessment & Plan Note (Signed)
Checking potassium today.

## 2023-08-14 NOTE — Assessment & Plan Note (Signed)
 Has degenerative disc disease of the cervical spine and is status post cervical laminectomy x 2.  Wonder if she would benefit from Botox injections.  She is hesitant.  Will try Nurtec as insurance allows.  Requires trial of Imitrex first.  Takes Fioricet 2 at a time.  Advised 30 Fioricet need to last 30 days.

## 2023-08-14 NOTE — Assessment & Plan Note (Signed)
 Referral to Pulmonology for pulmonary lymphadenopathy

## 2023-08-15 ENCOUNTER — Other Ambulatory Visit: Payer: Self-pay

## 2023-08-15 ENCOUNTER — Encounter: Payer: Self-pay | Admitting: Family Medicine

## 2023-08-15 LAB — CMP14+EGFR
ALT: 16 IU/L (ref 0–32)
AST: 20 IU/L (ref 0–40)
Albumin: 3.7 g/dL — ABNORMAL LOW (ref 3.9–4.9)
Alkaline Phosphatase: 111 IU/L (ref 44–121)
BUN/Creatinine Ratio: 9 — ABNORMAL LOW (ref 12–28)
BUN: 6 mg/dL — ABNORMAL LOW (ref 8–27)
Bilirubin Total: 0.3 mg/dL (ref 0.0–1.2)
CO2: 22 mmol/L (ref 20–29)
Calcium: 8.9 mg/dL (ref 8.7–10.3)
Chloride: 96 mmol/L (ref 96–106)
Creatinine, Ser: 0.68 mg/dL (ref 0.57–1.00)
Globulin, Total: 3.3 g/dL (ref 1.5–4.5)
Glucose: 93 mg/dL (ref 70–99)
Potassium: 4.4 mmol/L (ref 3.5–5.2)
Sodium: 131 mmol/L — ABNORMAL LOW (ref 134–144)
Total Protein: 7 g/dL (ref 6.0–8.5)
eGFR: 98 mL/min/{1.73_m2} (ref >59)

## 2023-08-16 MED ORDER — ESOMEPRAZOLE MAGNESIUM 40 MG PO CPDR: 40.0000 mg | DELAYED_RELEASE_CAPSULE | Freq: Every day | ORAL | 5 refills | Status: DC

## 2023-08-17 ENCOUNTER — Encounter: Payer: Self-pay | Admitting: Family Medicine

## 2023-08-17 ENCOUNTER — Other Ambulatory Visit: Payer: Self-pay | Admitting: Family Medicine

## 2023-08-17 DIAGNOSIS — F321 Major depressive disorder, single episode, moderate: Secondary | ICD-10-CM

## 2023-08-17 MED ORDER — QUETIAPINE FUMARATE 100 MG PO TABS: 100.0000 mg | ORAL_TABLET | Freq: Every day | ORAL | 1 refills | Status: DC

## 2023-09-04 ENCOUNTER — Other Ambulatory Visit: Payer: Self-pay | Admitting: Family Medicine

## 2023-09-04 DIAGNOSIS — G43009 Migraine without aura, not intractable, without status migrainosus: Secondary | ICD-10-CM

## 2023-09-05 ENCOUNTER — Other Ambulatory Visit: Payer: Self-pay | Admitting: Family Medicine

## 2023-09-06 ENCOUNTER — Other Ambulatory Visit: Payer: Self-pay | Admitting: Family Medicine

## 2023-09-06 DIAGNOSIS — G43009 Migraine without aura, not intractable, without status migrainosus: Secondary | ICD-10-CM

## 2023-09-06 MED ORDER — BUTALBITAL-APAP-CAFFEINE 50-325-40 MG PO TABS: 1.0000 | ORAL_TABLET | Freq: Four times a day (QID) | ORAL | 1 refills | Status: DC | PRN

## 2023-09-06 NOTE — Telephone Encounter (Signed)
 Copied from CRM [id number]. Topic: Clinical - Medication Refill >> Sep 06, 2023  8:51 AM Antwanette L wrote: Most Recent Primary Care Visit:  Provider: Alease Medina  Department: [facility]  Visit Type: OFFICE VISIT  Date: 08/14/2023  Medication: butalbital-acetaminophen-caffeine (FIORICET) 50-325-40 MG tablet  Has the patient contacted their pharmacy? No   Is this the correct pharmacy for this prescription?    This is the patient's preferred pharmacy:  [id number] - [address] [place] [address] Phone: [phone] Fax: [phone]   Has the prescription been filled recently? No  Is the patient out of the medication? Yes  Has the patient been seen for an appointment in the last year OR does the patient have an upcoming appointment? Yes  Can we respond through MyChart? Yes  Agent: Please be advised that Rx refills may take up to 3 business days. We ask that you follow-up with your pharmacy. >> Sep 06, 2023  9:01 AM Antwanette L wrote: The patient said she is currently out of the medicine and is requesting a refill

## 2023-09-06 NOTE — Telephone Encounter (Unsigned)
 Copied from CRM [id number]. Topic: Clinical - Medication Refill >> Sep 06, 2023  8:51 AM Antwanette L wrote: Most Recent Primary Care Visit:  Provider: Alease Medina  Department: [facility]  Visit Type: OFFICE VISIT  Date: 08/14/2023  Medication: butalbital-acetaminophen-caffeine (FIORICET) 50-325-40 MG tablet  Has the patient contacted their pharmacy? No   Is this the correct pharmacy for this prescription?    This is the patient's preferred pharmacy:  [id number] - [address] [place] [address] Phone: [phone] Fax: [phone]   Has the prescription been filled recently? No  Is the patient out of the medication? Yes  Has the patient been seen for an appointment in the last year OR does the patient have an upcoming appointment? Yes  Can we respond through MyChart? Yes  Agent: Please be advised that Rx refills may take up to 3 business days. We ask that you follow-up with your pharmacy.

## 2023-09-07 NOTE — Telephone Encounter (Signed)
Already responded to message.

## 2023-09-08 ENCOUNTER — Ambulatory Visit: Payer: Medicaid Other | Admitting: Physician Assistant

## 2023-09-15 ENCOUNTER — Ambulatory Visit (INDEPENDENT_AMBULATORY_CARE_PROVIDER_SITE_OTHER): Admitting: Physician Assistant

## 2023-09-15 DIAGNOSIS — Z466 Encounter for fitting and adjustment of urinary device: Secondary | ICD-10-CM | POA: Diagnosis not present

## 2023-09-15 NOTE — Progress Notes (Signed)
 Cath Change/ Replacement  Patient is present today for a catheter change due to urinary retention.  8ml of water was removed from the balloon, a 16FR foley cath was removed without difficulty.  Patient was cleaned and prepped in a sterile fashion with betadine and 2% lidocaine jelly was instilled into the urethra. A 16 FR Silastic foley cath was replaced into the bladder, no complications were noted. Urine return was noted 10ml and urine was clear in color. The balloon was filled with 10ml of sterile water. A night bag was attached for drainage.  A night bag was also given to the patient and patient was given instruction on how to change from one bag to another. Patient was given proper instruction on catheter care.    Performed by: Benay Pike CMA  Follow up: follow up one month

## 2023-09-23 ENCOUNTER — Other Ambulatory Visit: Payer: Self-pay | Admitting: Family Medicine

## 2023-10-17 ENCOUNTER — Other Ambulatory Visit: Payer: Self-pay | Admitting: Family Medicine

## 2023-10-17 ENCOUNTER — Other Ambulatory Visit: Payer: Self-pay | Admitting: Nurse Practitioner

## 2023-10-17 DIAGNOSIS — G43009 Migraine without aura, not intractable, without status migrainosus: Secondary | ICD-10-CM

## 2023-10-18 ENCOUNTER — Ambulatory Visit: Admitting: Physician Assistant

## 2023-10-18 ENCOUNTER — Encounter: Payer: Self-pay | Admitting: Physician Assistant

## 2023-10-18 ENCOUNTER — Ambulatory Visit (INDEPENDENT_AMBULATORY_CARE_PROVIDER_SITE_OTHER): Admitting: Physician Assistant

## 2023-10-18 VITALS — BP 142/83 | HR 76 | Ht 64.0 in | Wt 165.0 lb

## 2023-10-18 DIAGNOSIS — Z466 Encounter for fitting and adjustment of urinary device: Secondary | ICD-10-CM

## 2023-10-18 NOTE — Progress Notes (Signed)
 Cath Change/ Replacement  Patient is present today for a catheter change due to urinary retention.  8ml of water was removed from the balloon, a 16FR Silastic foley cath was removed without difficulty.  Patient was cleaned and prepped in a sterile fashion with betadine. A 16 FR Silastic foley cath was replaced into the bladder, no complications were noted. Urine return was noted and urine was clear in color. The balloon was filled with 10ml of sterile water. A night bag was attached for drainage.  Patient tolerated well.    Performed by: Ibn Stief, PA-C   Follow up: Return in about 4 weeks (around 11/15/2023) for Catheter exchange.

## 2023-10-19 ENCOUNTER — Ambulatory Visit: Payer: Self-pay

## 2023-10-19 DIAGNOSIS — G43009 Migraine without aura, not intractable, without status migrainosus: Secondary | ICD-10-CM

## 2023-10-19 NOTE — Telephone Encounter (Signed)
 Copied from CRM [id number]. Topic: Clinical - Medication Refill >> Oct 19, 2023  9:35 AM Felizardo Hotter wrote: Most Recent Primary Care Visit:  Provider: ZIGLAR, SUSAN K  Department: [facility]  Visit Type: OFFICE VISIT  Date: 08/14/2023  Medication: butalbital -acetaminophen -caffeine  (FIORICET ) 50-325-40 MG tablet  Has the patient contacted their pharmacy? Yes (Agent: If no, request that the patient contact the pharmacy for the refill. If patient does not wish to contact the pharmacy document the reason why and proceed with request.) (Agent: If yes, when and what did the pharmacy advise?)  Is this the correct pharmacy for this prescription? Yes If no, delete pharmacy and type the correct one.  This is the patient's preferred pharmacy:  [id number] [place] [facility] - [address] [place] [address] Phone: [phone] Fax: [phone]   Has the prescription been filled recently? Yes  Is the patient out of the medication? Yes  Has the patient been seen for an appointment in the last year OR does the patient have an upcoming appointment? Yes  Can we respond through MyChart? Yes  Agent: Please be advised that Rx refills may take up to 3 business days. We ask that you follow-up with your pharmacy.

## 2023-10-19 NOTE — Telephone Encounter (Signed)
 Copied from CRM [id number]. Topic: Clinical - Red Word Triage >> Oct 19, 2023  4:15 PM Fredrica W wrote: Red Word that prompted transfer to Nurse Triage: Headaches for past week - sensitive to light and sound - affecting kidneys from taking too much tylenol  - rx request submitted 4/29 and she has upcoming appt already scheduled.   Patient calling for a refill of her Fioricet . Patient has an appointment scheduled for next Friday to be seen in the office and would like to have it refilled prior to that appointment. She states she is experiencing a headache with light and sound sensitivity that she states is consistent with her history of migraines.   Patient would like a refill for Fioricet . Please advise.    Reason for Disposition  [1] Prescription refill request for NON-ESSENTIAL medicine (i.e., no harm to patient if med not taken) AND [2] triager unable to refill per department policy  Answer Assessment - Initial Assessment Questions 1. DRUG NAME: "What medicine do you need to have refilled?"     Fioricet   2. REFILLS REMAINING: "How many refills are remaining?" (Note: The label on the medicine or pill bottle will show how many refills are remaining. If there are no refills remaining, then a renewal may be needed.)     None 4. PRESCRIBING HCP: "Who prescribed it?" Reason: If prescribed by specialist, call should be referred to that group.     Dr. Ziglar  5. SYMPTOMS: "Do you have any symptoms?"     Headache, light and sound sensitivity  Protocols used: Medication Refill and Renewal Call-A-AH

## 2023-10-20 ENCOUNTER — Ambulatory Visit: Payer: Self-pay

## 2023-10-20 ENCOUNTER — Other Ambulatory Visit: Payer: Self-pay | Admitting: Family Medicine

## 2023-10-20 DIAGNOSIS — G43009 Migraine without aura, not intractable, without status migrainosus: Secondary | ICD-10-CM

## 2023-10-20 MED ORDER — BUTALBITAL-APAP-CAFFEINE 50-325-40 MG PO TABS: 1.0000 | ORAL_TABLET | Freq: Four times a day (QID) | ORAL | 1 refills | Status: DC | PRN

## 2023-10-20 NOTE — Telephone Encounter (Signed)
 This RN made first attempt to reach patient. No answer, LVM. Routing for additional attempts.   Copied from CRM [id number]. Topic: Clinical - Medication Question >> Oct 20, 2023  9:31 AM Essie A wrote: Reason for CRM: Patient is calling again regarding her medication, butalbital -acetaminophen -caffeine  (FIORICET ) 50-325-40 MG tablet.  She is out of the medication and wants to know the status of the refill.  Please return her call at [phone].

## 2023-10-20 NOTE — Telephone Encounter (Signed)
 Additional attempts made to reach patient; left voicemail for patient to call back nurse triage if she had any additional symptoms or questions to discuss. The medication, fioricet , was refilled today by her PCP.  Reason for Disposition  Third attempt to contact caller AND no contact made. Phone number verified.  Protocols used: No Contact or Duplicate Contact Call-A-AH

## 2023-10-20 NOTE — Addendum Note (Signed)
 Addended by: Evans Him on: 10/20/2023 11:47 AM   Modules accepted: Orders

## 2023-10-27 ENCOUNTER — Ambulatory Visit: Admitting: Family Medicine

## 2023-11-03 ENCOUNTER — Ambulatory Visit (INDEPENDENT_AMBULATORY_CARE_PROVIDER_SITE_OTHER): Admitting: Family Medicine

## 2023-11-03 ENCOUNTER — Encounter: Payer: Self-pay | Admitting: Family Medicine

## 2023-11-03 VITALS — BP 172/113 | HR 78 | Temp 98.0°F | Resp 18 | Ht 64.0 in | Wt 166.0 lb

## 2023-11-03 DIAGNOSIS — E559 Vitamin D deficiency, unspecified: Secondary | ICD-10-CM

## 2023-11-03 DIAGNOSIS — Z13820 Encounter for screening for osteoporosis: Secondary | ICD-10-CM

## 2023-11-03 DIAGNOSIS — G4486 Cervicogenic headache: Secondary | ICD-10-CM

## 2023-11-03 DIAGNOSIS — F32A Depression, unspecified: Secondary | ICD-10-CM

## 2023-11-03 DIAGNOSIS — I10 Essential (primary) hypertension: Secondary | ICD-10-CM | POA: Diagnosis not present

## 2023-11-03 DIAGNOSIS — G43009 Migraine without aura, not intractable, without status migrainosus: Secondary | ICD-10-CM | POA: Diagnosis not present

## 2023-11-03 DIAGNOSIS — F419 Anxiety disorder, unspecified: Secondary | ICD-10-CM

## 2023-11-03 DIAGNOSIS — Z1231 Encounter for screening mammogram for malignant neoplasm of breast: Secondary | ICD-10-CM

## 2023-11-03 MED ORDER — CLONIDINE HCL 0.1 MG PO TABS
0.1000 mg | ORAL_TABLET | Freq: Once | ORAL | Status: AC
  Administered 2023-11-03: 0.1 mg via ORAL

## 2023-11-03 MED ORDER — ALPRAZOLAM 1 MG PO TABS: 1.0000 mg | ORAL_TABLET | Freq: Three times a day (TID) | ORAL | 3 refills | Status: DC

## 2023-11-03 MED ORDER — ATORVASTATIN CALCIUM 40 MG PO TABS: 40.0000 mg | ORAL_TABLET | Freq: Every day | ORAL | 1 refills | Status: DC

## 2023-11-03 MED ORDER — AMLODIPINE BESYLATE 10 MG PO TABS: 10.0000 mg | ORAL_TABLET | Freq: Every day | ORAL | 1 refills | Status: DC

## 2023-11-03 MED ORDER — BUTALBITAL-APAP-CAFFEINE 50-325-40 MG PO TABS: 1.0000 | ORAL_TABLET | Freq: Four times a day (QID) | ORAL | 1 refills | Status: DC | PRN

## 2023-11-03 MED ORDER — METOPROLOL SUCCINATE ER 100 MG PO TB24: 100.0000 mg | ORAL_TABLET | Freq: Two times a day (BID) | ORAL | 1 refills | Status: DC

## 2023-11-03 NOTE — Progress Notes (Signed)
 Established Patient Office Visit  Subjective   Patient ID: Katelyn [name], female    DOB: 01-15-1961  Age: 63 y.o. MRN: [id number]  Chief Complaint  Patient presents with   Medical Management of Chronic Issues   Mass    To spots on head   GI Problem   Fatigue    HPI Delightful 63 yo woman with HTN, Vitamin D  deficiency, migraine headaches, COPD, GAD pulmonary nodules, Hx hypokalemia, Hx hyponatremia, colon cancer screening (07/08/2021 Cologuard), DDD cervical spine (s/p cervical laminectomy), DDD lumbar spine (spinal canal stenosis), s/p hysterectomy, atonic bladder (indwelling Foley) She is getting her Foley changed monthly without any problems.. 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 She has pulmonary lymphadenopathy bilateral hilar lymph nodes and multiple enlarged mediastinal nodes.  She has not had her follow-up CT scan of her lungs.  She has that rescheduled for 2 months.  She is smoking about a half a pack of cigarettes a day now she started when she was 63 years old and at times she has smoked a pack a day. She is still having daily headaches which she thinks comes from her neck.  She has Fioricet  and she takes 2 at a time to get her head to stop hurting.  She has not seen neurology in a while.  She had a trial of Imitrex  for her migraine headaches and reports that Imitrex  makes her heart feel funny.  She denies chest pains, palpitations, SOB, diaphoresis.  Discussed a referral to neurology. For her blood pressure she is on clonidine  0.1 twice daily, metoprolol  100 mg daily and amlodipine  10 mg.  She has stopped taking her hydrochlorothiazide  secondary to hyponatremia. She is worried about 2 knots on her head that are nontender. She is terribly depressed and has anxiety PHQ-9 score was 20 and GAD 7 score is 12.  She denies suicidal and homicidal ideations.  She takes Xanax  1 mg 3 times a day and Seroquel  100 mg nightly.  Discussed a  referral to psychiatry. Last mammogram 04/09/2021 B RADS 1.  Discussed repeating her mammogram and she is willing to do this.     ROS    Objective:     BP (!) 172/113 (BP Location: Left Arm, Patient Position: Sitting, Cuff Size: Normal)   Pulse 78   Temp 98 F (36.7 C) (Oral)   Resp 18   Ht 5\' 4"  (1.626 m)   Wt 166 lb (75.3 kg)   SpO2 98%   BMI 28.49 kg/m    Physical Exam Vitals and nursing note reviewed.  Constitutional:      Appearance: Normal appearance.  HENT:     Head: Normocephalic and atraumatic.  Eyes:     Conjunctiva/sclera: Conjunctivae normal.  Cardiovascular:     Rate and Rhythm: Normal rate and regular rhythm.  Pulmonary:     Effort: Pulmonary effort is normal.     Breath sounds: Normal breath sounds.  Musculoskeletal:     Right lower leg: No edema.     Left lower leg: No edema.  Skin:    General: Skin is warm and dry.     Comments: She has 2 nodules about 1 cm each on her scalp.  Nontender to palpation.  Neurological:     Mental Status: She is alert and oriented to person, place, and time.  Psychiatric:        Mood and Affect: Mood normal.  Behavior: Behavior normal.        Thought Content: Thought content normal.        Judgment: Judgment normal.          No results found for any visits on 11/03/23.    The 10-year ASCVD risk score (Arnett DK, et al., 2019) is: 20.7%    Assessment & Plan:  Primary hypertension Assessment & Plan: Gave her a clonidine  0.1 mg in the office but she refused to stay long enough to see any effect.  Is on clonidine  0.1 twice daily metoprolol  100 mg daily and amlodipine  10 mg daily.  She is off HCTZ secondary to her hyponatremia.  Encouraging her to check her blood pressures at home and bring those numbers and for comparison to our equipment.  Checking CBC, CMP, lipid, TSH.  Am considering referral to renal for blood pressure control  Orders: -     amLODIPine  Besylate; Take 1 tablet (10 mg total) by  mouth daily.  Dispense: 90 tablet; Refill: 1 -     Atorvastatin  Calcium ; Take 1 tablet (40 mg total) by mouth daily.  Dispense: 90 tablet; Refill: 1 -     Metoprolol  Succinate ER; Take 1 tablet (100 mg total) by mouth 2 (two) times daily.  Dispense: 90 tablet; Refill: 1 -     CBC with Differential/Platelet -     Comprehensive metabolic panel with GFR -     Lipid panel -     TSH -     cloNIDine  HCl  Anxiety -     ALPRAZolam ; Take 1 tablet (1 mg total) by mouth 3 (three) times daily.  Dispense: 90 tablet; Refill: 3  Encounter for screening mammogram for malignant neoplasm of breast -     3D Screening Mammogram, Left and Right; Future  Screening for osteoporosis Assessment & Plan: Has not had a mammogram since 04/08/2021.  Will place orders.  She has never had a DEXA scan.  She has smoked all of her adult life.  She agrees to a DEXA.  Orders: -     DG Bone Density; Future  Migraine without aura and without status migrainosus, not intractable -     Butalbital -APAP-Caffeine ; Take 1 tablet by mouth every 6 (six) hours as needed for headache or migraine.  Dispense: 90 tablet; Refill: 1  Vitamin D  deficiency -     VITAMIN D  25 Hydroxy (Vit-D Deficiency, Fractures)  Anxiety and depression Assessment & Plan: She is on Xanax  1 mg 3 times daily and Seroquel  100 mg nightly PHQ-9 score is 20 and GAD 7 score is 12.  Has tried SSRIs in the past and did not tolerate them.  Discussed referral to psychiatry and she agrees.   Cervicogenic headache Assessment & Plan: Has chronic daily migraines.  She believes these are related to her cervical disc disease.  Did a trial of Imitrex  and she did not like the way it made her feel.  He is taking Fioricet  2 tablets daily.  Discussed referral to neurology for possible Botox.  She would be interested in this.   Screening mammogram for breast cancer Assessment & Plan: Has not had a mammogram since 04/08/2021.  Will place orders.  She has never had a DEXA  scan.  She has smoked all of her adult life.  She agrees to a DEXA.      Return in about 3 months (around 02/03/2024) for BP recheck.    Geraldy Akridge K Bailen Geffre, MD

## 2023-11-06 NOTE — Assessment & Plan Note (Signed)
 She is on Xanax  1 mg 3 times daily and Seroquel  100 mg nightly PHQ-9 score is 20 and GAD 7 score is 12.  Has tried SSRIs in the past and did not tolerate them.  Discussed referral to psychiatry and she agrees.

## 2023-11-06 NOTE — Assessment & Plan Note (Addendum)
 Has not had a mammogram since 04/08/2021.  Will place orders.  She has never had a DEXA scan.  She has smoked all of her adult life.  She agrees to a DEXA.

## 2023-11-06 NOTE — Assessment & Plan Note (Signed)
 Has chronic daily migraines.  She believes these are related to her cervical disc disease.  Did a trial of Imitrex  and she did not like the way it made her feel.  He is taking Fioricet  2 tablets daily.  Discussed referral to neurology for possible Botox.  She would be interested in this.

## 2023-11-06 NOTE — Assessment & Plan Note (Addendum)
 Gave her a clonidine  0.1 mg in the office but she refused to stay long enough to see any effect.  Is on clonidine  0.1 twice daily metoprolol  100 mg daily and amlodipine  10 mg daily.  She is off HCTZ secondary to her hyponatremia.  Encouraging her to check her blood pressures at home and bring those numbers and for comparison to our equipment.  Checking CBC, CMP, lipid, TSH.  Am considering referral to renal for blood pressure control

## 2023-11-14 ENCOUNTER — Ambulatory Visit: Payer: Medicaid Other | Admitting: Family Medicine

## 2023-11-15 ENCOUNTER — Ambulatory Visit (INDEPENDENT_AMBULATORY_CARE_PROVIDER_SITE_OTHER): Admitting: Physician Assistant

## 2023-11-15 VITALS — BP 194/110 | HR 77 | Ht 64.0 in | Wt 166.0 lb

## 2023-11-15 DIAGNOSIS — N3289 Other specified disorders of bladder: Secondary | ICD-10-CM | POA: Diagnosis not present

## 2023-11-15 DIAGNOSIS — Z466 Encounter for fitting and adjustment of urinary device: Secondary | ICD-10-CM

## 2023-11-15 NOTE — Progress Notes (Signed)
 Cath Change/ Replacement  Patient is present today for a catheter change due to urinary retention.  8ml of water was removed from the balloon, a 16FR Silastic foley cath was removed without difficulty.  Patient was cleaned and prepped in a sterile fashion with betadine. A 16 FR Silastic foley cath was replaced into the bladder, no complications were noted. Urine return was noted 60ml and urine was clear in color. The balloon was filled with 10ml of sterile water. A night bag was attached for drainage.  Patient tolerated well.    Performed by: Darlene Bartelt, PA-C  Additional notes: She had painful bladder spasms this month, especially over the past week, no fevers. She hesitates to take oxybutynin  because it makes her constipated. Gemtesa  previously gave her headaches.  She felt better after Foley replacement and immediately drained a significant volume of urine; suspect poor drainage from old Foley. May consider upsizing her in the future to reduce her risk of encrustation. May also consider Myrbetriq 25mg  for spasms.  Follow up: Return in about 4 weeks (around 12/13/2023) for Catheter exchange.

## 2023-11-16 ENCOUNTER — Ambulatory Visit: Admitting: Physician Assistant

## 2023-12-13 ENCOUNTER — Ambulatory Visit (INDEPENDENT_AMBULATORY_CARE_PROVIDER_SITE_OTHER): Admitting: Physician Assistant

## 2023-12-13 VITALS — BP 136/90 | HR 86 | Ht 64.0 in | Wt 162.0 lb

## 2023-12-13 DIAGNOSIS — Z466 Encounter for fitting and adjustment of urinary device: Secondary | ICD-10-CM

## 2023-12-13 NOTE — Progress Notes (Signed)
 Cath Change/ Replacement  Patient is present today for a catheter change due to urinary retention.  8ml of water was removed from the balloon, a 16FR foley cath was removed without difficulty.  Patient was cleaned and prepped in a sterile fashion with betadine and 2% lidocaine  jelly was instilled into the urethra. A 16 FR foley cath was replaced into the bladder, no complications were noted. Urine return was noted 8ml and urine was yellowle in color. The balloon was filled with 10ml of sterile water. A leg bag was attached for drainage.  A night bag was also given to the patient and patient was given instruction on how to change from one bag to another. Patient was given proper instruction on catheter care.    Performed by: Powell Sink, CMA and Lucie Hones, PA-C   Follow up: Return in about 4 weeks (around 01/10/2024) for Catheter exchange.

## 2023-12-19 NOTE — Telephone Encounter (Signed)
 error

## 2024-01-03 ENCOUNTER — Encounter: Payer: Self-pay | Admitting: Family Medicine

## 2024-01-03 ENCOUNTER — Ambulatory Visit (INDEPENDENT_AMBULATORY_CARE_PROVIDER_SITE_OTHER): Admitting: Family Medicine

## 2024-01-03 VITALS — BP 146/93 | HR 76 | Temp 98.1°F | Resp 20 | Ht 64.0 in | Wt 157.0 lb

## 2024-01-03 DIAGNOSIS — J432 Centrilobular emphysema: Secondary | ICD-10-CM

## 2024-01-03 DIAGNOSIS — K589 Irritable bowel syndrome without diarrhea: Secondary | ICD-10-CM | POA: Insufficient documentation

## 2024-01-03 DIAGNOSIS — G43009 Migraine without aura, not intractable, without status migrainosus: Secondary | ICD-10-CM

## 2024-01-03 DIAGNOSIS — K582 Mixed irritable bowel syndrome: Secondary | ICD-10-CM

## 2024-01-03 DIAGNOSIS — I10 Essential (primary) hypertension: Secondary | ICD-10-CM | POA: Diagnosis not present

## 2024-01-03 DIAGNOSIS — F321 Major depressive disorder, single episode, moderate: Secondary | ICD-10-CM

## 2024-01-03 DIAGNOSIS — Z1231 Encounter for screening mammogram for malignant neoplasm of breast: Secondary | ICD-10-CM

## 2024-01-03 DIAGNOSIS — F419 Anxiety disorder, unspecified: Secondary | ICD-10-CM | POA: Diagnosis not present

## 2024-01-03 DIAGNOSIS — K219 Gastro-esophageal reflux disease without esophagitis: Secondary | ICD-10-CM

## 2024-01-03 DIAGNOSIS — M5416 Radiculopathy, lumbar region: Secondary | ICD-10-CM

## 2024-01-03 DIAGNOSIS — R109 Unspecified abdominal pain: Secondary | ICD-10-CM

## 2024-01-03 MED ORDER — CLONIDINE HCL 0.2 MG PO TABS: 0.2000 mg | ORAL_TABLET | Freq: Two times a day (BID) | ORAL | 0 refills | Status: DC

## 2024-01-03 MED ORDER — METOCLOPRAMIDE HCL 5 MG PO TABS: 5.0000 mg | ORAL_TABLET | Freq: Three times a day (TID) | ORAL | 0 refills | Status: DC

## 2024-01-03 MED ORDER — METOPROLOL SUCCINATE ER 100 MG PO TB24
100.0000 mg | ORAL_TABLET | Freq: Every day | ORAL | 1 refills | Status: DC
Start: 2024-01-03 — End: 2024-02-16

## 2024-01-03 MED ORDER — PROMETHAZINE HCL 25 MG PO TABS: 25.0000 mg | ORAL_TABLET | Freq: Four times a day (QID) | ORAL | 0 refills | Status: DC | PRN

## 2024-01-03 MED ORDER — CLONIDINE HCL 0.1 MG PO TABS
0.1000 mg | ORAL_TABLET | Freq: Once | ORAL | Status: AC
  Administered 2024-01-03: 0.1 mg via ORAL

## 2024-01-03 MED ORDER — ESOMEPRAZOLE MAGNESIUM 40 MG PO CPDR: 40.0000 mg | DELAYED_RELEASE_CAPSULE | Freq: Every day | ORAL | 3 refills | Status: DC

## 2024-01-03 MED ORDER — QUETIAPINE FUMARATE 100 MG PO TABS: 100.0000 mg | ORAL_TABLET | Freq: Every day | ORAL | 1 refills | Status: DC

## 2024-01-03 MED ORDER — HYOSCYAMINE SULFATE 0.125 MG PO TABS: 0.1250 mg | ORAL_TABLET | ORAL | 3 refills | Status: DC | PRN

## 2024-01-03 MED ORDER — BUTALBITAL-APAP-CAFFEINE 50-325-40 MG PO TABS
1.0000 | ORAL_TABLET | Freq: Four times a day (QID) | ORAL | 1 refills | Status: DC | PRN
Start: 2024-01-03 — End: 2024-02-16

## 2024-01-03 MED ORDER — FLUTICASONE PROPIONATE HFA 220 MCG/ACT IN AERO: 1.0000 | INHALATION_SPRAY | Freq: Two times a day (BID) | RESPIRATORY_TRACT | 12 refills | Status: DC

## 2024-01-03 MED ORDER — ALBUTEROL SULFATE HFA 108 (90 BASE) MCG/ACT IN AERS
2.0000 | INHALATION_SPRAY | RESPIRATORY_TRACT | 2 refills | Status: DC | PRN
Start: 2024-01-03 — End: 2024-05-03

## 2024-01-03 MED ORDER — ALPRAZOLAM 1 MG PO TABS: 1.0000 mg | ORAL_TABLET | Freq: Three times a day (TID) | ORAL | 3 refills | Status: DC

## 2024-01-03 MED ORDER — GABAPENTIN 300 MG PO CAPS
600.0000 mg | ORAL_CAPSULE | Freq: Two times a day (BID) | ORAL | 3 refills | Status: DC
Start: 2024-01-03 — End: 2024-03-04

## 2024-01-03 NOTE — Assessment & Plan Note (Signed)
 She is on Nexium  but still having nausea and heartburn.  Trial Reglan  5 mg 4 times daily.  If she has nausea she can have Phenergan  25 mg every 6 hours.

## 2024-01-03 NOTE — Progress Notes (Signed)
 Established Patient Office Visit  Subjective   Patient ID: Katelyn [name], female    DOB: 1960-08-26  Age: 63 y.o. MRN: [id number]  Chief Complaint  Patient presents with   Medical Management of Chronic Issues   Nausea   Irritable Bowel Syndrome   Anxiety     Delightful 63 yo woman with HTN, Vitamin D  deficiency, migraine headaches, COPD, GAD, pulmonary nodules, Hx hypokalemia, Hx hyponatremia, colon cancer screening (07/08/2021 Cologuard), DDD cervical spine (s/p cervical laminectomy), DDD lumbar spine (spinal canal stenosis), s/p hysterectomy, atonic bladder (indwelling Foley)   She is very upset today because her house is under foreclosure.  Her air conditioner is out and she has had no water for 2 weeks.  She reports that she is taking amlodipine  10 mg metoprolol  succinate 100 mg one half twice daily and clonidine  0.1 mg twice daily.  Her blood pressure is elevated despite this.  She does not check her blood pressure at home. She reports she is having a lot of nausea and heartburn and requests refills on Reglan  and Phenergan .  She is taking Nexium  40 mg daily but is still having GERD symptoms.  She complains that her IBS is acting up she has a lot of abdominal pain, bloating.  She denies constipation and diarrhea.  She has taken Bentyl  but is not helping.  She has never tried Levsin  Her last mammogram was 04/08/2021.  She is willing to repeat her mammogram.  She has not gotten her DEXA scan.  She is also overdue for her cholesterol blood work.  Have asked her to come in  fasting and get that done.       ROS    Objective:     BP (!) 146/93 (BP Location: Right Arm, Patient Position: Sitting, Cuff Size: Normal)   Pulse 76   Temp 98.1 F (36.7 C) (Oral)   Resp 20   Ht 5' 4 (1.626 m)   Wt 157 lb (71.2 kg)   BMI 26.95 kg/m    Physical Exam Vitals and nursing note reviewed.  Constitutional:      Appearance: Normal appearance.  HENT:     Head: Normocephalic and atraumatic.   Eyes:     Conjunctiva/sclera: Conjunctivae normal.  Cardiovascular:     Rate and Rhythm: Normal rate and regular rhythm.  Pulmonary:     Effort: Pulmonary effort is normal.     Breath sounds: Normal breath sounds.  Musculoskeletal:     Right lower leg: No edema.     Left lower leg: No edema.  Skin:    General: Skin is warm and dry.  Neurological:     Mental Status: She is alert and oriented to person, place, and time.  Psychiatric:        Mood and Affect: Mood normal.        Behavior: Behavior normal.        Thought Content: Thought content normal.        Judgment: Judgment normal.          No results found for any visits on 01/03/24.    The 10-year ASCVD risk score (Arnett DK, et al., 2019) is: 15.3%    Assessment & Plan:  Centrilobular emphysema (HCC) -     Albuterol  Sulfate HFA; Inhale 2 puffs into the lungs every 4 (four) hours as needed for wheezing or shortness of breath.  Dispense: 18 each; Refill: 2 -     Fluticasone  Propionate HFA; Inhale 1 puff into the  lungs in the morning and at bedtime.  Dispense: 1 each; Refill: 12  Anxiety -     ALPRAZolam ; Take 1 tablet (1 mg total) by mouth 3 (three) times daily.  Dispense: 90 tablet; Refill: 3  Primary hypertension Assessment & Plan: Blood pressure is not well-controlled despite metoprolol  succinate 100 mg daily, amlodipine  10 mg daily and clonidine  0.1 twice daily.  Will increase clonidine  to 0.2 mg twice daily.  Follow-up in a month for recheck your blood pressure  Orders: -     Metoprolol  Succinate ER; Take 1 tablet (100 mg total) by mouth daily.  Dispense: 90 tablet; Refill: 1 -     cloNIDine  HCl; Take 1 tablet (0.2 mg total) by mouth 2 (two) times daily.  Dispense: 30 tablet; Refill: 0 -     cloNIDine  HCl -     CBC with Differential/Platelet; Future -     Lipid panel; Future -     Comprehensive metabolic panel with GFR; Future -     TSH + free T4; Future  Depression, major, single episode, moderate  (HCC) -     QUEtiapine  Fumarate; Take 1 tablet (100 mg total) by mouth at bedtime.  Dispense: 90 tablet; Refill: 1  GERD without esophagitis Assessment & Plan: She is on Nexium  but still having nausea and heartburn.  Trial Reglan  5 mg 4 times daily.  If she has nausea she can have Phenergan  25 mg every 6 hours.  Orders: -     Esomeprazole  Magnesium ; Take 1 capsule (40 mg total) by mouth daily.  Dispense: 90 capsule; Refill: 3 -     Metoclopramide  HCl; Take 1 tablet (5 mg total) by mouth 4 (four) times daily -  before meals and at bedtime.  Dispense: 120 tablet; Refill: 0 -     Promethazine  HCl; Take 1 tablet (25 mg total) by mouth every 6 (six) hours as needed for nausea or vomiting.  Dispense: 30 tablet; Refill: 0  Lumbar radiculopathy -     Gabapentin ; Take 2 capsules (600 mg total) by mouth 2 (two) times daily.  Dispense: 120 capsule; Refill: 3  Encounter for screening mammogram for malignant neoplasm of breast Assessment & Plan: She has not had a mammogram in several years.  Put in orders for her mammogram.  Orders: -     3D Screening Mammogram, Left and Right; Future  Stomach cramps -     Hyoscyamine  Sulfate; Take 1 tablet (0.125 mg total) by mouth every 4 (four) hours as needed for cramping.  Dispense: 90 tablet; Refill: 3  Migraine without aura and without status migrainosus, not intractable -     Butalbital -APAP-Caffeine ; Take 1 tablet by mouth every 6 (six) hours as needed for headache or migraine.  Dispense: 90 tablet; Refill: 1  Irritable bowel syndrome with both constipation and diarrhea Assessment & Plan: She is taking Bentyl  and still having symptoms.  Stop the Bentyl  and trial Levsin  0.125 mg every 4 hours as needed.      Return in about 4 weeks (around 01/31/2024).    Audery Wassenaar K Jasdeep Dejarnett, MD

## 2024-01-03 NOTE — Assessment & Plan Note (Signed)
 She is taking Bentyl  and still having symptoms.  Stop the Bentyl  and trial Levsin  0.125 mg every 4 hours as needed.

## 2024-01-03 NOTE — Assessment & Plan Note (Signed)
 Blood pressure is not well-controlled despite metoprolol  succinate 100 mg daily, amlodipine  10 mg daily and clonidine  0.1 twice daily.  Will increase clonidine  to 0.2 mg twice daily.  Follow-up in a month for recheck your blood pressure

## 2024-01-03 NOTE — Assessment & Plan Note (Signed)
 She has not had a mammogram in several years.  Put in orders for her mammogram.

## 2024-01-08 ENCOUNTER — Ambulatory Visit: Payer: Self-pay

## 2024-01-08 ENCOUNTER — Encounter: Payer: Self-pay | Admitting: *Deleted

## 2024-01-08 DIAGNOSIS — I10 Essential (primary) hypertension: Secondary | ICD-10-CM

## 2024-01-08 NOTE — Telephone Encounter (Signed)
 FYI Only or Action Required?: Action required by provider: clinical question for provider. - patient asking for care advice. Has been taking stool softeners. Patient firmly believes constipation is related to Clonidine .  Patient was last seen in primary care on 01/03/2024 by Ziglar, Susan K, MD.  Called Nurse Triage reporting Constipation.  Symptoms began several days ago.  Interventions attempted: OTC medications: stool softeners.  Symptoms are: unchanged.  Triage Disposition: Home Care  Patient/caregiver understands and will follow disposition?: No, wishes to speak with PCP   Copied from CRM [id number]. Topic: Clinical - Medical Advice >> Jan 08, 2024  8:43 AM Annabella S wrote: Reason for CRM: cloNIDine  (CATAPRES ) 0.2 MG tablet [[id number]] Patient stated increased dosage has cause severe constipation please follow up with patient. Last bowel movement was Saturday Reason for Disposition  MILD constipation  Answer Assessment - Initial Assessment Questions 1. STOOL PATTERN OR FREQUENCY: How often do you have a bowel movement (BM)?  (Normal range: 3 times a day to every 3 days)  When was your last BM?       Saturday - before this, patient had regular daily bowel movements  3. ONSET: When did the constipation begin?     Patient states it started Friday  4. RECTAL PAIN: Does your rectum hurt when the stool comes out? If Yes, ask: Do you have hemorrhoids? How bad is the pain?  (Scale 1-10; or mild, moderate, severe)     No  5. BM COMPOSITION: Are the stools hard?      Patient states it was runny because of stool softener  6. BLOOD ON STOOLS: Has there been any blood on the toilet tissue or on the surface of the BM? If Yes, ask: When was the last time?     No  7. CHRONIC CONSTIPATION: Is this a new problem for you?  If No, ask: How long have you had this problem? (days, weeks, months)      Patient states it's only been since the Clonidine  was increased, it is not a  chronic problem before that  8. CHANGES IN DIET OR HYDRATION: Have there been any recent changes in your diet? How much fluids are you drinking on a daily basis?  How much have you had to drink today?     No  9. MEDICINES: Have you been taking any new medicines? Are you taking any narcotic pain medicines? (e.g., Dilaudid , morphine , Percocet, Vicodin)     Patient had increase in Clonidine   10. LAXATIVES: Have you been using any stool softeners, laxatives, or enemas?  If Yes, ask What are you using, how often, and when was the last time?       Stool softener  12. CAUSE: What do you think is causing the constipation?        Clonidine  - patient states it has happened since PCP increased the dosage  13. MEDICAL HISTORY: Do you have a history of hemorrhoids, rectal fissures, rectal surgery, or rectal abscess?         No  14. OTHER SYMPTOMS: Do you have any other symptoms? (e.g., abdomen pain, bloating, fever, vomiting)       Abdominal pain on right side under ribs   Denies fever, vomiting  Protocols used: Constipation-A-AH

## 2024-01-09 MED ORDER — BLOOD PRESSURE KIT DEVI: 1.0000 | Freq: Every day | 0 refills | Status: DC

## 2024-01-09 NOTE — Addendum Note (Signed)
 Addended by: RAYANN REXENE HERO on: 01/09/2024 08:27 AM   Modules accepted: Orders

## 2024-01-10 ENCOUNTER — Other Ambulatory Visit: Payer: Self-pay | Admitting: Family Medicine

## 2024-01-10 DIAGNOSIS — I10 Essential (primary) hypertension: Secondary | ICD-10-CM

## 2024-01-10 DIAGNOSIS — R109 Unspecified abdominal pain: Secondary | ICD-10-CM

## 2024-01-12 ENCOUNTER — Ambulatory Visit (INDEPENDENT_AMBULATORY_CARE_PROVIDER_SITE_OTHER): Admitting: Physician Assistant

## 2024-01-12 VITALS — BP 178/113 | HR 81 | Ht 64.0 in | Wt 157.0 lb

## 2024-01-12 DIAGNOSIS — Z466 Encounter for fitting and adjustment of urinary device: Secondary | ICD-10-CM | POA: Diagnosis not present

## 2024-01-12 DIAGNOSIS — R3989 Other symptoms and signs involving the genitourinary system: Secondary | ICD-10-CM | POA: Diagnosis not present

## 2024-01-12 LAB — URINALYSIS, COMPLETE
Bilirubin, UA: NEGATIVE
Glucose, UA: NEGATIVE
Ketones, UA: NEGATIVE
Nitrite, UA: NEGATIVE
Protein,UA: NEGATIVE
Specific Gravity, UA: <1.005 — ABNORMAL LOW (ref 1.005–1.030)
Urobilinogen, Ur: 0.2 mg/dL (ref 0.2–1.0)
pH, UA: 6 (ref 5.0–7.5)

## 2024-01-12 LAB — MICROSCOPIC EXAMINATION: WBC, UA: >30 /HPF — AB (ref 0–5)

## 2024-01-12 MED ORDER — CEPHALEXIN 500 MG PO CAPS: 500.0000 mg | ORAL_CAPSULE | Freq: Two times a day (BID) | ORAL | 0 refills | Status: AC

## 2024-01-12 NOTE — Progress Notes (Signed)
 Cath Change/ Replacement  Patient is present today for a catheter change due to urinary retention.  8ml of water was removed from the balloon, a 16FR Silastic foley cath was removed without difficulty.  Patient was cleaned and prepped in a sterile fashion with betadine. A 18 FR Silastic foley cath was replaced into the bladder, no complications were noted. Urine return was noted and urine was clear in color. The balloon was filled with 10ml of sterile water. A night bag was attached for drainage.  Patient tolerated well.    Performed by: Zenna Traister, PA-C   Additional notes: Reports several days of bladder discomfort, malodorous urine, and burning pain.  Her catheter is draining, though with her history of encrustation I attempted placement of a slightly quite larger catheter today and there was some apparent bladder residual.  Due to concerns for possible UTI, I obtained a urine specimen from her new catheter and we will start her on empiric Keflex .  We discussed catheter irrigation to try to reduce encrustation, but she has hesitant to attempt this.  We decided to start changing her catheter every 3-3 and half weeks to try to reduce encrustation events.  Follow up: Return in about 4 weeks (around 02/09/2024) for Catheter exchange.

## 2024-01-20 LAB — CULTURE, URINE COMPREHENSIVE

## 2024-01-22 ENCOUNTER — Other Ambulatory Visit: Payer: Self-pay | Admitting: Family Medicine

## 2024-01-22 DIAGNOSIS — G43009 Migraine without aura, not intractable, without status migrainosus: Secondary | ICD-10-CM

## 2024-01-23 ENCOUNTER — Telehealth: Payer: Self-pay

## 2024-01-23 ENCOUNTER — Other Ambulatory Visit: Payer: Self-pay | Admitting: Family Medicine

## 2024-01-23 DIAGNOSIS — I10 Essential (primary) hypertension: Secondary | ICD-10-CM

## 2024-01-23 MED ORDER — CLONIDINE HCL 0.2 MG PO TABS: 0.2000 mg | ORAL_TABLET | Freq: Two times a day (BID) | ORAL | 3 refills | Status: AC

## 2024-01-23 NOTE — Telephone Encounter (Signed)
 Called patient to reschedule her appt for Aug 15th to the following week. She said she needs a refill on her Clonidine  0.2 mg tablets. She said she takes it twice daily and she only received 30 pills and she will be out before her next appt.  Please advise.   Pharmacy: [facility]

## 2024-01-30 ENCOUNTER — Other Ambulatory Visit: Payer: Self-pay | Admitting: Family Medicine

## 2024-01-30 DIAGNOSIS — K219 Gastro-esophageal reflux disease without esophagitis: Secondary | ICD-10-CM

## 2024-02-01 ENCOUNTER — Other Ambulatory Visit: Payer: Self-pay | Admitting: Family Medicine

## 2024-02-01 DIAGNOSIS — I10 Essential (primary) hypertension: Secondary | ICD-10-CM

## 2024-02-02 ENCOUNTER — Ambulatory Visit: Admitting: Family Medicine

## 2024-02-07 ENCOUNTER — Ambulatory Visit (INDEPENDENT_AMBULATORY_CARE_PROVIDER_SITE_OTHER): Admitting: Physician Assistant

## 2024-02-07 DIAGNOSIS — N390 Urinary tract infection, site not specified: Secondary | ICD-10-CM | POA: Diagnosis not present

## 2024-02-07 DIAGNOSIS — Z466 Encounter for fitting and adjustment of urinary device: Secondary | ICD-10-CM

## 2024-02-07 LAB — MICROSCOPIC EXAMINATION

## 2024-02-07 LAB — URINALYSIS, COMPLETE
Bilirubin, UA: NEGATIVE
Glucose, UA: NEGATIVE
Ketones, UA: NEGATIVE
Nitrite, UA: NEGATIVE
Protein,UA: NEGATIVE
Specific Gravity, UA: <1.005 — ABNORMAL LOW (ref 1.005–1.030)
Urobilinogen, Ur: 0.2 mg/dL (ref 0.2–1.0)
pH, UA: 7 (ref 5.0–7.5)

## 2024-02-07 MED ORDER — FOSFOMYCIN TROMETHAMINE 3 G PO PACK: 3.0000 g | PACK | Freq: Once | ORAL | 0 refills | Status: AC

## 2024-02-07 NOTE — Progress Notes (Signed)
 Cath Change/ Replacement  Patient is present today for a catheter change due to urinary retention.  8ml of water was removed from the balloon, a 18FR Silastic foley cath was removed without difficulty.  Patient was cleaned and prepped in a sterile fashion with betadine. A 18 FR Silastic foley cath was replaced into the bladder, no complications were noted. Urine return was noted and urine was clear in color. The balloon was filled with 10ml of sterile water. A night bag was attached for drainage.  Patient tolerated well.    Performed by: Adiana Smelcer, PA-C   Additional Notes: She again reports malodorous urine and bladder pain and is concerned for recurrent versus persistent UTI.  There is significant sediment in her drainage tubing on arrival today.  There was immediate drainage of a significant volume of urine with catheter exchange, and I continue to have concerns about catheter encrustation in this patient.  She is unable to tolerate lemonade or citric acid containing fluids due to her GERD.  I gave her Litholyte samples today to try to see if she tolerates these better.  I obtained a urine specimen from the new catheter today for testing and we will send in empiric fosfomycin.  I had a very frank conversation with her today that if she is unable to tolerate p.o. citrate, she will need to start vinegar bladder installations to reduce her urinary encrustation.  She is extremely resistant to this and states she cannot do it.  I was firm with her that she needs to be open to the idea, as I do not have any other solutions for her.  Will get a KUB to make sure she is not developing any bladder stones.  Follow up: Return in about 4 weeks (around 03/06/2024) for Catheter exchange.  I spent 20 minutes on the day of the encounter to include pre-visit record review, face-to-face time with the patient, and post-visit ordering of tests.

## 2024-02-09 ENCOUNTER — Ambulatory Visit: Admitting: Family Medicine

## 2024-02-12 ENCOUNTER — Ambulatory Visit: Admitting: Physician Assistant

## 2024-02-13 LAB — CULTURE, URINE COMPREHENSIVE

## 2024-02-15 ENCOUNTER — Other Ambulatory Visit: Payer: Self-pay | Admitting: Family Medicine

## 2024-02-15 ENCOUNTER — Ambulatory Visit: Payer: Self-pay

## 2024-02-15 DIAGNOSIS — K219 Gastro-esophageal reflux disease without esophagitis: Secondary | ICD-10-CM

## 2024-02-15 MED ORDER — PROMETHAZINE HCL 25 MG PO TABS: 25.0000 mg | ORAL_TABLET | Freq: Four times a day (QID) | ORAL | 0 refills | Status: DC | PRN

## 2024-02-15 NOTE — Telephone Encounter (Signed)
 FYI Only or Action Required?: Action required by provider: medication refill request.  Patient was last seen in primary care on 01/03/2024 by Ziglar, Susan K, MD.  Called Nurse Triage reporting No chief complaint on file..  Symptoms began today.  Interventions attempted: Nothing.  Symptoms are: stable.  Triage Disposition: No disposition on file.  Patient/caregiver understands and will follow disposition?:

## 2024-02-15 NOTE — Telephone Encounter (Signed)
 FYI Only or Action Required?: Action required by provider: medication refill request.  Patient was last seen in primary care on 01/03/2024 by Ziglar, Devere POUR, MD.  Called Nurse Triage reporting Nausea.  Symptoms began today.  Interventions attempted: Nothing.  Symptoms are: stable.  Triage Disposition: Home Care  Patient/caregiver understands and will follow disposition?: Yes  Will sent med refill request separately.  Summary: nauseated   Patient states she is very nauseated. Requesting refill for promethazine  (PHENERGAN ) 25 MG tablet.         Reason for Disposition  Unexplained nausea  Answer Assessment - Initial Assessment Questions 1. NAUSEA SEVERITY: How bad is the nausea? (e.g., mild, moderate, severe; dehydration, weight loss)     States just wants a refill of this medication 2. ONSET: When did the nausea begin?     yesterday  Protocols used: Nausea-A-AH

## 2024-02-16 ENCOUNTER — Other Ambulatory Visit: Payer: Self-pay | Admitting: Family Medicine

## 2024-02-16 ENCOUNTER — Ambulatory Visit: Admitting: Family Medicine

## 2024-02-16 DIAGNOSIS — G43009 Migraine without aura, not intractable, without status migrainosus: Secondary | ICD-10-CM

## 2024-02-16 NOTE — Telephone Encounter (Signed)
 Rx accidentally printed.routing to P\CP to have it electronically sent in.

## 2024-02-21 NOTE — Telephone Encounter (Signed)
 Taking xanax .  Should not be taking fiorcet also.

## 2024-03-01 ENCOUNTER — Ambulatory Visit: Admitting: Family Medicine

## 2024-03-04 ENCOUNTER — Other Ambulatory Visit: Payer: Self-pay | Admitting: Family Medicine

## 2024-03-04 DIAGNOSIS — M5416 Radiculopathy, lumbar region: Secondary | ICD-10-CM

## 2024-03-04 NOTE — Telephone Encounter (Signed)
 No recent Vitamin D  levels on file. Please advise.

## 2024-03-04 NOTE — Telephone Encounter (Unsigned)
 Copied from CRM [id number]. Topic: Clinical - Medication Refill >> Mar 04, 2024 10:40 AM Winona R wrote: Medication:  gabapentin  (NEURONTIN ) 600 MG capsule Vitamin D , Ergocalciferol , (DRISDOL ) 1.25 MG (50000 UNIT) CAPS capsule  Has the patient contacted their pharmacy? Yes, no answer from office (Agent: If no, request that the patient contact the pharmacy for the refill. If patient does not wish to contact the pharmacy document the reason why and proceed with request.) (Agent: If yes, when and what did the pharmacy advise?)  This is the patient's preferred pharmacy:  [id number] - [address] [place] [address] Phone: [phone] Fax: [phone]   Is this the correct pharmacy for this prescription? Yes If no, delete pharmacy and type the correct one.   Has the prescription been filled recently? No  Is the patient out of the medication? Yes  Has the patient been seen for an appointment in the last year OR does the patient have an upcoming appointment? Yes  Can we respond through MyChart? Yes  Agent: Please be advised that Rx refills may take up to 3 business days. We ask that you follow-up with your pharmacy.

## 2024-03-06 ENCOUNTER — Telehealth: Payer: Self-pay | Admitting: Family Medicine

## 2024-03-06 ENCOUNTER — Ambulatory Visit: Admitting: Physician Assistant

## 2024-03-06 MED ORDER — GABAPENTIN 300 MG PO CAPS
600.0000 mg | ORAL_CAPSULE | Freq: Two times a day (BID) | ORAL | 2 refills | Status: DC
Start: 2024-03-06 — End: 2024-05-03

## 2024-03-06 NOTE — Telephone Encounter (Signed)
 Copied from CRM [id number]. Topic: General - Call Back - No Documentation >> Mar 06, 2024 10:41 AM Avram MATSU wrote: Reason for CRM: Patient called to check on her refill request for vitamin D , saw a note there was no vitamin D  levels on file, please call patient to discuss further, [phone]

## 2024-03-06 NOTE — Telephone Encounter (Signed)
**Note De-identified  [place] Obfuscation** Please advise 

## 2024-03-07 ENCOUNTER — Other Ambulatory Visit: Payer: Self-pay | Admitting: Family Medicine

## 2024-03-07 ENCOUNTER — Encounter: Payer: Self-pay | Admitting: Intensive Care

## 2024-03-07 ENCOUNTER — Other Ambulatory Visit: Payer: Self-pay

## 2024-03-07 ENCOUNTER — Emergency Department
Admission: EM | Admit: 2024-03-07 | Discharge: 2024-03-07 | Disposition: A | Discharge status: Home / Self Care | Attending: Emergency Medicine | Admitting: Emergency Medicine

## 2024-03-07 ENCOUNTER — Emergency Department

## 2024-03-07 DIAGNOSIS — N390 Urinary tract infection, site not specified: Secondary | ICD-10-CM | POA: Diagnosis not present

## 2024-03-07 DIAGNOSIS — R1031 Right lower quadrant pain: Secondary | ICD-10-CM | POA: Diagnosis present

## 2024-03-07 LAB — COMPREHENSIVE METABOLIC PANEL WITH GFR
ALT: 22 U/L (ref 0–44)
AST: 36 U/L (ref 15–41)
Albumin: 3.6 g/dL (ref 3.5–5.0)
Alkaline Phosphatase: 98 U/L (ref 38–126)
Anion gap: 8 (ref 5–15)
BUN: 5 mg/dL — ABNORMAL LOW (ref 8–23)
CO2: 28 mmol/L (ref 22–32)
Calcium: 9.1 mg/dL (ref 8.9–10.3)
Chloride: 94 mmol/L — ABNORMAL LOW (ref 98–111)
Creatinine, Ser: 0.74 mg/dL (ref 0.44–1.00)
GFR, Estimated: >60 mL/min (ref >60)
Glucose, Bld: 108 mg/dL — ABNORMAL HIGH (ref 70–99)
Potassium: 4.7 mmol/L (ref 3.5–5.1)
Sodium: 130 mmol/L — ABNORMAL LOW (ref 135–145)
Total Bilirubin: 0.8 mg/dL (ref 0.0–1.2)
Total Protein: 8.4 g/dL — ABNORMAL HIGH (ref 6.5–8.1)

## 2024-03-07 LAB — URINALYSIS, ROUTINE W REFLEX MICROSCOPIC
Bilirubin Urine: NEGATIVE
Glucose, UA: NEGATIVE mg/dL
Ketones, ur: NEGATIVE mg/dL
Nitrite: NEGATIVE
Protein, ur: NEGATIVE mg/dL
Specific Gravity, Urine: 1.002 — ABNORMAL LOW (ref 1.005–1.030)
Squamous Epithelial / HPF: 0 /HPF (ref 0–5)
pH: 8 (ref 5.0–8.0)

## 2024-03-07 LAB — CBC
HCT: 37.5 % (ref 36.0–46.0)
Hemoglobin: 12.7 g/dL (ref 12.0–15.0)
MCH: 31.1 pg (ref 26.0–34.0)
MCHC: 33.9 g/dL (ref 30.0–36.0)
MCV: 91.7 fL (ref 80.0–100.0)
Platelets: 178 K/uL (ref 150–400)
RBC: 4.09 MIL/uL (ref 3.87–5.11)
RDW: 13.1 % (ref 11.5–15.5)
WBC: 6.8 K/uL (ref 4.0–10.5)
nRBC: 0 % (ref 0.0–0.2)

## 2024-03-07 LAB — LIPASE, BLOOD: Lipase: 31 U/L (ref 11–51)

## 2024-03-07 MED ORDER — IOHEXOL 350 MG/ML SOLN
100.0000 mL | Freq: Once | INTRAVENOUS | Status: AC | PRN
Start: 2024-03-07 — End: 2024-03-07
  Administered 2024-03-07: 100 mL via INTRAVENOUS

## 2024-03-07 MED ORDER — FENTANYL CITRATE PF 50 MCG/ML IJ SOSY
50.0000 ug | PREFILLED_SYRINGE | Freq: Once | INTRAMUSCULAR | Status: AC
  Administered 2024-03-07: 50 ug via INTRAVENOUS
  Filled 2024-03-07: qty 1

## 2024-03-07 MED ORDER — PHENAZOPYRIDINE HCL 200 MG PO TABS
200.0000 mg | ORAL_TABLET | Freq: Once | ORAL | Status: AC
  Administered 2024-03-07: 200 mg via ORAL
  Filled 2024-03-07: qty 1

## 2024-03-07 MED ORDER — CEFTRIAXONE SODIUM 1 G IJ SOLR
1.0000 g | Freq: Once | INTRAMUSCULAR | Status: AC
  Administered 2024-03-07: 1 g via INTRAVENOUS
  Filled 2024-03-07: qty 10

## 2024-03-07 MED ORDER — ONDANSETRON HCL 4 MG/2ML IJ SOLN
4.0000 mg | Freq: Once | INTRAMUSCULAR | Status: AC
  Administered 2024-03-07: 4 mg via INTRAVENOUS
  Filled 2024-03-07: qty 2

## 2024-03-07 MED ORDER — CEPHALEXIN 500 MG PO CAPS: 500.0000 mg | ORAL_CAPSULE | Freq: Three times a day (TID) | ORAL | 0 refills | Status: AC

## 2024-03-07 MED ORDER — PHENAZOPYRIDINE HCL 100 MG PO TABS
100.0000 mg | ORAL_TABLET | Freq: Three times a day (TID) | ORAL | 0 refills | Status: AC | PRN
Start: 2024-03-07 — End: 2025-03-07

## 2024-03-07 MED ORDER — SODIUM CHLORIDE 0.9 % IV BOLUS
1000.0000 mL | Freq: Once | INTRAVENOUS | Status: AC
  Administered 2024-03-07: 1000 mL via INTRAVENOUS

## 2024-03-07 MED ORDER — KETOROLAC TROMETHAMINE 15 MG/ML IJ SOLN
15.0000 mg | Freq: Once | INTRAMUSCULAR | Status: AC
  Administered 2024-03-07: 15 mg via INTRAVENOUS
  Filled 2024-03-07: qty 1

## 2024-03-07 MED ORDER — HALOPERIDOL LACTATE 5 MG/ML IJ SOLN
5.0000 mg | Freq: Once | INTRAMUSCULAR | Status: AC
  Administered 2024-03-07: 5 mg via INTRAVENOUS
  Filled 2024-03-07: qty 1

## 2024-03-07 NOTE — ED Notes (Signed)
 Additional meds given for nausea/pain

## 2024-03-07 NOTE — ED Triage Notes (Addendum)
 Arrived by [facility] from home with c/o lower right quadrant and flank pain with nausea. History UTI's.  Currently has foley catheter. Reports she has been seeing a urologist for urinary retention  [facility] vitals: 80s HR 135/92 b/p 100% RA 128CBG

## 2024-03-07 NOTE — ED Notes (Signed)
 See triage note   Presents with some right lower abd pain which moves into flank area  Positive nausea  Denies any fever and is currently afebrile on arrival

## 2024-03-07 NOTE — ED Provider Notes (Signed)
 [facility] Provider Note    Event Date/Time   First MD Initiated Contact with Patient 03/07/24 1107     (approximate)   History   Abdominal Pain   HPI  Katelyn [name] is a 63 y.o. female who presents to the emergency department today because of concerns for abdominal pain.  Started 1 week ago.  Located in her right lower quadrant.  Has been constant.  Has been accompanied by nausea.  She denies any diarrhea or bloody stool.  Has a Foley catheter secondary to neurogenic bladder although has not noticed any change in output but has noticed bad odor.  She has had chills.  Denies similar pain in the past.     Physical Exam   Triage Vital Signs: ED Triage Vitals  Encounter Vitals Group     BP 03/07/24 1014 (!) 175/98     Girls Systolic BP Percentile --      Girls Diastolic BP Percentile --      Boys Systolic BP Percentile --      Boys Diastolic BP Percentile --      Pulse Rate 03/07/24 1014 81     Resp 03/07/24 1014 16     Temp 03/07/24 1014 98.3 F (36.8 C)     Temp Source 03/07/24 1014 Oral     SpO2 03/07/24 1014 97 %     Weight 03/07/24 1017 160 lb (72.6 kg)     Height 03/07/24 1017 5' 4 (1.626 m)     Head Circumference --      Peak Flow --      Pain Score 03/07/24 1016 10     Pain Loc --      Pain Education --      Exclude from Growth Chart --     Most recent vital signs: Vitals:   03/07/24 1014  BP: (!) 175/98  Pulse: 81  Resp: 16  Temp: 98.3 F (36.8 C)  SpO2: 97%   General: Awake, alert, oriented. CV:  Good peripheral perfusion. Regular rate and rhythm. Resp:  Normal effort. Lungs clear. Abd:  No distention. Tender to palpation on the right side.   ED Results / Procedures / Treatments   Labs (all labs ordered are listed, but only abnormal results are displayed) Labs Reviewed  COMPREHENSIVE METABOLIC PANEL WITH GFR - Abnormal; Notable for the following components:      Result Value   Sodium 130 (*)    Chloride 94 (*)     Glucose, Bld 108 (*)    BUN 5 (*)    Total Protein 8.4 (*)    All other components within normal limits  URINALYSIS, ROUTINE W REFLEX MICROSCOPIC - Abnormal; Notable for the following components:   Color, Urine YELLOW (*)    APPearance HAZY (*)    Specific Gravity, Urine 1.002 (*)    Hgb urine dipstick MODERATE (*)    Leukocytes,Ua LARGE (*)    Bacteria, UA RARE (*)    All other components within normal limits  URINE CULTURE  LIPASE, BLOOD  CBC     EKG  None   RADIOLOGY I independently interpreted and visualized the CT abd/pel. My interpretation: No free air Radiology interpretation:  IMPRESSION:  1. Mild enhancement of right renal pelvic wall and proximal ureter  is noted suggesting possible ascending urinary tract infection.  2. Aortic atherosclerosis.     PROCEDURES:  Critical Care performed: No    MEDICATIONS ORDERED IN ED: Medications - No  data to display   IMPRESSION / MDM / ASSESSMENT AND PLAN / ED COURSE  I reviewed the triage vital signs and the nursing notes.                              Differential diagnosis includes, but is not limited to, kidney stone, appendicitis, diverticulitis, UTI  Patient's presentation is most consistent with acute presentation with potential threat to life or bodily function.   Patient present to the emergency department today because of concerns for right lower quadrant abdominal pain.  On exam she is tender.  She does have a Foley catheter in.  She states she has noticed a bad odor to it.  Did obtain CT scan here in the emergency department.  It was concerning for possible urinary tract infection.  I do think this could explain the patient's symptoms.  Will send off urine culture.  Patient was given dose of IV antibiotics here.  Will discharge with further antibiotics.     FINAL CLINICAL IMPRESSION(S) / ED DIAGNOSES   Final diagnoses:  Lower urinary tract infectious disease       Note:  This document was  prepared using Dragon voice recognition software and may include unintentional dictation errors.    Floy Roberts, MD 03/07/24 [phone]

## 2024-03-08 LAB — URINE CULTURE

## 2024-03-13 ENCOUNTER — Ambulatory Visit: Admitting: Urology

## 2024-03-14 ENCOUNTER — Other Ambulatory Visit: Payer: Self-pay | Admitting: Family Medicine

## 2024-03-14 DIAGNOSIS — K219 Gastro-esophageal reflux disease without esophagitis: Secondary | ICD-10-CM

## 2024-03-15 ENCOUNTER — Encounter: Payer: Self-pay | Admitting: Emergency Medicine

## 2024-03-15 ENCOUNTER — Ambulatory Visit: Admitting: Family Medicine

## 2024-03-15 ENCOUNTER — Other Ambulatory Visit: Payer: Self-pay

## 2024-03-15 ENCOUNTER — Emergency Department
Admission: EM | Admit: 2024-03-15 | Discharge: 2024-03-15 | Disposition: A | Discharge status: Home / Self Care | Attending: Emergency Medicine | Admitting: Emergency Medicine

## 2024-03-15 ENCOUNTER — Emergency Department

## 2024-03-15 DIAGNOSIS — R110 Nausea: Secondary | ICD-10-CM | POA: Insufficient documentation

## 2024-03-15 DIAGNOSIS — R109 Unspecified abdominal pain: Secondary | ICD-10-CM | POA: Diagnosis not present

## 2024-03-15 LAB — COMPREHENSIVE METABOLIC PANEL WITH GFR
ALT: 21 U/L (ref 0–44)
AST: 26 U/L (ref 15–41)
Albumin: 3.6 g/dL (ref 3.5–5.0)
Alkaline Phosphatase: 99 U/L (ref 38–126)
Anion gap: 7 (ref 5–15)
BUN: 8 mg/dL (ref 8–23)
CO2: 26 mmol/L (ref 22–32)
Calcium: 8.9 mg/dL (ref 8.9–10.3)
Chloride: 97 mmol/L — ABNORMAL LOW (ref 98–111)
Creatinine, Ser: 0.7 mg/dL (ref 0.44–1.00)
GFR, Estimated: >60 mL/min (ref >60)
Glucose, Bld: 105 mg/dL — ABNORMAL HIGH (ref 70–99)
Potassium: 4.5 mmol/L (ref 3.5–5.1)
Sodium: 130 mmol/L — ABNORMAL LOW (ref 135–145)
Total Bilirubin: 0.4 mg/dL (ref 0.0–1.2)
Total Protein: 8.2 g/dL — ABNORMAL HIGH (ref 6.5–8.1)

## 2024-03-15 LAB — CBC
HCT: 36.4 % (ref 36.0–46.0)
Hemoglobin: 12.4 g/dL (ref 12.0–15.0)
MCH: 31.8 pg (ref 26.0–34.0)
MCHC: 34.1 g/dL (ref 30.0–36.0)
MCV: 93.3 fL (ref 80.0–100.0)
Platelets: 208 K/uL (ref 150–400)
RBC: 3.9 MIL/uL (ref 3.87–5.11)
RDW: 13.2 % (ref 11.5–15.5)
WBC: 5.9 K/uL (ref 4.0–10.5)
nRBC: 0 % (ref 0.0–0.2)

## 2024-03-15 LAB — URINALYSIS, ROUTINE W REFLEX MICROSCOPIC
Bilirubin Urine: NEGATIVE
Glucose, UA: NEGATIVE mg/dL
Ketones, ur: NEGATIVE mg/dL
Nitrite: NEGATIVE
Protein, ur: NEGATIVE mg/dL
Specific Gravity, Urine: 1.004 — ABNORMAL LOW (ref 1.005–1.030)
Squamous Epithelial / HPF: 0 /HPF (ref 0–5)
WBC, UA: >50 WBC/hpf (ref 0–5)
pH: 7 (ref 5.0–8.0)

## 2024-03-15 LAB — LIPASE, BLOOD: Lipase: 22 U/L (ref 11–51)

## 2024-03-15 MED ORDER — ACETAMINOPHEN 500 MG PO TABS: 1000.0000 mg | ORAL_TABLET | Freq: Once | ORAL | Status: AC

## 2024-03-15 MED ORDER — SODIUM CHLORIDE 0.9 % IV SOLN
12.5000 mg | Freq: Once | INTRAVENOUS | Status: AC
  Administered 2024-03-15: 12.5 mg via INTRAVENOUS
  Filled 2024-03-15: qty 12.5

## 2024-03-15 MED ORDER — PANTOPRAZOLE SODIUM 40 MG IV SOLR
40.0000 mg | Freq: Once | INTRAVENOUS | Status: AC
  Administered 2024-03-15: 40 mg via INTRAVENOUS
  Filled 2024-03-15: qty 10

## 2024-03-15 MED ORDER — PROMETHAZINE HCL 12.5 MG PO TABS: 12.5000 mg | ORAL_TABLET | Freq: Four times a day (QID) | ORAL | 0 refills | Status: AC | PRN

## 2024-03-15 MED ORDER — SODIUM CHLORIDE 0.9 % IV SOLN
12.5000 mg | Freq: Four times a day (QID) | INTRAVENOUS | Status: DC | PRN
  Administered 2024-03-15: 12.5 mg via INTRAVENOUS
  Filled 2024-03-15: qty 12.5

## 2024-03-15 MED ORDER — PROMETHAZINE HCL 12.5 MG RE SUPP: 12.5000 mg | Freq: Four times a day (QID) | RECTAL | 0 refills | Status: AC | PRN

## 2024-03-15 MED ORDER — METOCLOPRAMIDE HCL 5 MG/ML IJ SOLN
10.0000 mg | Freq: Once | INTRAMUSCULAR | Status: AC
  Administered 2024-03-15: 10 mg via INTRAVENOUS
  Filled 2024-03-15: qty 2

## 2024-03-15 MED ORDER — SODIUM CHLORIDE 0.9 % IV BOLUS
1000.0000 mL | Freq: Once | INTRAVENOUS | Status: AC
Start: 2024-03-15 — End: 2024-03-15
  Administered 2024-03-15: 1000 mL via INTRAVENOUS

## 2024-03-15 MED ORDER — ACETAMINOPHEN 500 MG PO TABS
ORAL_TABLET | ORAL | Status: AC
  Administered 2024-03-15: 1000 mg via ORAL
  Filled 2024-03-15: qty 2

## 2024-03-15 NOTE — ED Notes (Signed)
 See triage note  Presents with some generalized abd pain  but mainly nausea  States the nausea has been going on for 3 weeks

## 2024-03-15 NOTE — ED Notes (Signed)
 States she is still nauseated   Provider made aware

## 2024-03-15 NOTE — ED Notes (Signed)
 Pt teaching provided on medications that may cause drowsiness. Pt instructed not to drive or operate heavy machinery while taking the prescribed medication. Pt verbalized understanding.   Pt completely assessed by EDP and set to discharge. No RN assessment performed.  Pt provided discharge instructions and prescription information. Pt was given the opportunity to ask questions and questions were answered.

## 2024-03-15 NOTE — ED Provider Notes (Signed)
 [facility] Provider Note    Event Date/Time   First MD Initiated Contact with Patient 03/15/24 1124     (approximate)   History   Nausea   HPI  Katelyn [name] is a 63 y.o. female  who presents to the emergency department today because of concern for nausea. She was seen 8 days ago in the ED with similar symptoms. Was diagnosed with a UTI at that time, had CT scan performed which did not show any concerning findings. Since discharge the patient states her symptoms have persisted. She made a follow up appointment with her PCP but was unable to make it given the nausea. The patient denies any fevers. Has not noticed any bloody stool.       Physical Exam   Triage Vital Signs: ED Triage Vitals  Encounter Vitals Group     BP 03/15/24 1115 (!) 153/89     Girls Systolic BP Percentile --      Girls Diastolic BP Percentile --      Boys Systolic BP Percentile --      Boys Diastolic BP Percentile --      Pulse Rate 03/15/24 1115 74     Resp 03/15/24 1115 17     Temp 03/15/24 1115 98.1 F (36.7 C)     Temp Source 03/15/24 1115 Oral     SpO2 03/15/24 1115 98 %     Weight 03/15/24 1114 160 lb (72.6 kg)     Height 03/15/24 1114 5' 5 (1.651 m)     Head Circumference --      Peak Flow --      Pain Score 03/15/24 1114 6     Pain Loc --      Pain Education --      Exclude from Growth Chart --     Most recent vital signs: Vitals:   03/15/24 1115  BP: (!) 153/89  Pulse: 74  Resp: 17  Temp: 98.1 F (36.7 C)  SpO2: 98%   General: Awake, alert, oriented. CV:  Good peripheral perfusion. Regular rate and rhythm. Resp:  Normal effort. Lungs clear. Abd:  No distention. Minimally tender diffusely.   ED Results / Procedures / Treatments   Labs (all labs ordered are listed, but only abnormal results are displayed) Labs Reviewed  COMPREHENSIVE METABOLIC PANEL WITH GFR - Abnormal; Notable for the following components:      Result Value   Sodium 130 (*)     Chloride 97 (*)    Glucose, Bld 105 (*)    Total Protein 8.2 (*)    All other components within normal limits  URINALYSIS, ROUTINE W REFLEX MICROSCOPIC - Abnormal; Notable for the following components:   Color, Urine YELLOW (*)    APPearance HAZY (*)    Specific Gravity, Urine 1.004 (*)    Hgb urine dipstick MODERATE (*)    Leukocytes,Ua LARGE (*)    Bacteria, UA MANY (*)    All other components within normal limits  URINE CULTURE  LIPASE, BLOOD  CBC     EKG  None.   RADIOLOGY I independently interpreted and visualized the US  RUQ. My interpretation: No abnormal fluid collection Radiology interpretation:  IMPRESSION:  Surgical absence of the gallbladder. Physiologic dilatation of the  common bile duct postop early. No evidence of biliary obstruction.  Liver is normal.    PROCEDURES:  Critical Care performed: No    MEDICATIONS ORDERED IN ED: Medications - No data to display  IMPRESSION / MDM / ASSESSMENT AND PLAN / ED COURSE  I reviewed the triage vital signs and the nursing notes.                              Differential diagnosis includes, but is not limited to, IBS, gastritis, intraabdominal infection  Patient's presentation is most consistent with acute presentation with potential threat to life or bodily function.  Patient presented to the emergency department today because of concern for continued abdominal pain. Afebrile. No significant tenderness on exam. Patient's blood work without concerning leukocytosis or electrolyte abnormality. UA still with findings concerning for infection however patient does have catheter, will send for culture. Patient did feel better after IV fluids and medication. At this time given recent abdominal imaging for same symptoms do not feel repeat CT is necessary. After medication and fluids patient was able to tolerate PO. Will plan on discharging with prescription for phenergan . Will give GI follow up information.       FINAL CLINICAL IMPRESSION(S) / ED DIAGNOSES   Final diagnoses:  Nausea        Note:  This document was prepared using Dragon voice recognition software and may include unintentional dictation errors.    Floy Roberts, MD 03/16/24 [phone]

## 2024-03-15 NOTE — ED Triage Notes (Signed)
 First nurse note: pt to ED [facility] from home for nausea, abd pain for few days. Last BM yesterday. On antibiotics for UTI VSS with EMS

## 2024-03-15 NOTE — ED Triage Notes (Signed)
 Patient to ED via [facility] from home for generalized abd pain with nausea. States ongoing x3 weeks. Currently on antibiotics for a UTI.

## 2024-03-19 LAB — URINE CULTURE: Culture: >=100000 — AB

## 2024-03-20 NOTE — Procedures (Signed)
 ED Antimicrobial Stewardship Positive Culture Follow Up   Katelyn [name] is an 63 y.o. female who presented to [facility] on 03/15/2024 with a chief complaint of  Chief Complaint  Patient presents with   Abdominal Pain    Recent Results (from the past 720 hours)  Urine Culture     Status: Abnormal   Collection Time: 03/07/24 11:04 AM   Specimen: Urine, Clean Catch  Result Value Ref Range Status   Specimen Description   Final    URINE, CLEAN CATCH Performed at [facility], [address] [place], [place] [address]    Special Requests   Final    NONE Performed at [facility], [address], [place], [place] [address]    Culture MULTIPLE SPECIES PRESENT, SUGGEST RECOLLECTION (A)  Final   Report Status 03/08/2024 FINAL  Final  Urine Culture     Status: Abnormal   Collection Time: 03/15/24 11:15 AM   Specimen: Urine, Catheterized  Result Value Ref Range Status   Specimen Description   Final    URINE, CATHETERIZED Performed at [facility], [address] [place], [place] [address]    Special Requests   Final    NONE Performed at [facility], [address] [place], [place] [address]    Culture (A)  Final    >=100,000 COLONIES/mL PSEUDOMONAS AERUGINOSA >=100,000 COLONIES/mL SERRATIA MARCESCENS    Report Status 03/19/2024 FINAL  Final   Organism ID, Bacteria SERRATIA MARCESCENS (A)  Final   Organism ID, Bacteria PSEUDOMONAS AERUGINOSA (A)  Final      Susceptibility   Pseudomonas aeruginosa - MIC*    MEROPENEM 1 SENSITIVE Sensitive     CIPROFLOXACIN 0.25 SENSITIVE Sensitive     IMIPENEM 2 SENSITIVE Sensitive     CEFTAZIDIME /AVIBACTAM 2 SENSITIVE Sensitive     CEFTOLOZANE/TAZOBACTAM 1 SENSITIVE Sensitive     TOBRAMYCIN <=1 SENSITIVE Sensitive     CEFTAZIDIME  >=32 RESISTANT Resistant     * >=100,000 COLONIES/mL PSEUDOMONAS AERUGINOSA   Serratia marcescens - MIC*    CEFEPIME <=0.12 SENSITIVE Sensitive     ERTAPENEM <=0.12  SENSITIVE Sensitive     CEFTRIAXONE  <=0.25 SENSITIVE Sensitive     CIPROFLOXACIN 0.12 SENSITIVE Sensitive     GENTAMICIN <=1 SENSITIVE Sensitive     NITROFURANTOIN 128 RESISTANT Resistant     TRIMETH/SULFA <=20 SENSITIVE Sensitive     MEROPENEM <=0.25 SENSITIVE Sensitive     * >=100,000 COLONIES/mL SERRATIA MARCESCENS    [x]  Patient discharged originally without antimicrobial agent and treatment is now indicated  New antibiotic prescription: Levofloxacin  750 mg daily for 5 days  Called patient and discussed results and treatment plan. Patient acknowledged need for treatment and confirmed she can pick up at preferred pharmacy, [facility] [address] [phone]. Called medication into pharmacy for pick-up.    ED Provider: Dorothyann Franky Annabella LOISE Haynes 03/20/2024, 2:33 PM Clinical Pharmacist Monday - Friday phone -  [phone] Saturday - Sunday phone - [phone]

## 2024-03-25 NOTE — Progress Notes (Unsigned)
 Cath Change/ Replacement  Patient is present today for a catheter change due to urinary retention.  ***ml of water was removed from the balloon, a ***FR foley cath was removed without difficulty.  Patient was cleaned and prepped in a sterile fashion with betadine and 2% lidocaine  jelly was instilled into the urethra. A *** FR foley cath was replaced into the bladder, {dnt complications:20057}. Urine return was noted ***ml and urine was *** in color. The balloon was filled with 10ml of sterile water. A *** bag was attached for drainage.  A night bag was also given to the patient and patient was given instruction on how to change from one bag to another. Patient was given proper instruction on catheter care.    Performed by: ***  Follow up: No follow-ups on file.

## 2024-03-27 ENCOUNTER — Ambulatory Visit (INDEPENDENT_AMBULATORY_CARE_PROVIDER_SITE_OTHER): Admitting: Urology

## 2024-03-27 DIAGNOSIS — N390 Urinary tract infection, site not specified: Secondary | ICD-10-CM

## 2024-03-27 LAB — MICROSCOPIC EXAMINATION: WBC, UA: >30 /HPF — AB (ref 0–5)

## 2024-03-27 LAB — URINALYSIS, COMPLETE
Bilirubin, UA: NEGATIVE
Glucose, UA: NEGATIVE
Ketones, UA: NEGATIVE
Nitrite, UA: NEGATIVE
Protein,UA: NEGATIVE
Specific Gravity, UA: <1.005 — ABNORMAL LOW (ref 1.005–1.030)
Urobilinogen, Ur: 0.2 mg/dL (ref 0.2–1.0)
pH, UA: 6 (ref 5.0–7.5)

## 2024-04-04 LAB — CULTURE, URINE COMPREHENSIVE

## 2024-04-05 ENCOUNTER — Ambulatory Visit: Admitting: Family Medicine

## 2024-04-05 ENCOUNTER — Ambulatory Visit: Payer: Self-pay | Admitting: Urology

## 2024-04-05 DIAGNOSIS — N390 Urinary tract infection, site not specified: Secondary | ICD-10-CM

## 2024-04-09 MED ORDER — FLUCONAZOLE 100 MG PO TABS: 100.0000 mg | ORAL_TABLET | Freq: Every day | ORAL | 0 refills | Status: DC

## 2024-04-12 ENCOUNTER — Ambulatory Visit: Admitting: Family Medicine

## 2024-04-19 ENCOUNTER — Ambulatory Visit: Admitting: Family Medicine

## 2024-04-29 ENCOUNTER — Ambulatory Visit: Admitting: Physician Assistant

## 2024-05-03 ENCOUNTER — Ambulatory Visit (INDEPENDENT_AMBULATORY_CARE_PROVIDER_SITE_OTHER): Admitting: Family Medicine

## 2024-05-03 VITALS — BP 179/115 | HR 79 | Temp 97.5°F | Resp 20 | Ht 65.0 in | Wt 151.0 lb

## 2024-05-03 DIAGNOSIS — M5416 Radiculopathy, lumbar region: Secondary | ICD-10-CM

## 2024-05-03 DIAGNOSIS — F419 Anxiety disorder, unspecified: Secondary | ICD-10-CM

## 2024-05-03 DIAGNOSIS — R109 Unspecified abdominal pain: Secondary | ICD-10-CM

## 2024-05-03 DIAGNOSIS — I10 Essential (primary) hypertension: Secondary | ICD-10-CM | POA: Diagnosis not present

## 2024-05-03 DIAGNOSIS — Z23 Encounter for immunization: Secondary | ICD-10-CM

## 2024-05-03 DIAGNOSIS — F32A Depression, unspecified: Secondary | ICD-10-CM

## 2024-05-03 DIAGNOSIS — F321 Major depressive disorder, single episode, moderate: Secondary | ICD-10-CM | POA: Diagnosis not present

## 2024-05-03 DIAGNOSIS — R300 Dysuria: Secondary | ICD-10-CM

## 2024-05-03 DIAGNOSIS — K219 Gastro-esophageal reflux disease without esophagitis: Secondary | ICD-10-CM

## 2024-05-03 DIAGNOSIS — J432 Centrilobular emphysema: Secondary | ICD-10-CM

## 2024-05-03 DIAGNOSIS — R002 Palpitations: Secondary | ICD-10-CM

## 2024-05-03 DIAGNOSIS — I498 Other specified cardiac arrhythmias: Secondary | ICD-10-CM | POA: Diagnosis not present

## 2024-05-03 DIAGNOSIS — Z978 Presence of other specified devices: Secondary | ICD-10-CM

## 2024-05-03 DIAGNOSIS — G43009 Migraine without aura, not intractable, without status migrainosus: Secondary | ICD-10-CM

## 2024-05-03 DIAGNOSIS — J440 Chronic obstructive pulmonary disease with acute lower respiratory infection: Secondary | ICD-10-CM

## 2024-05-03 DIAGNOSIS — J209 Acute bronchitis, unspecified: Secondary | ICD-10-CM

## 2024-05-03 MED ORDER — QUETIAPINE FUMARATE 100 MG PO TABS: 100.0000 mg | ORAL_TABLET | Freq: Every day | ORAL | 1 refills | Status: AC

## 2024-05-03 MED ORDER — PROMETHAZINE HCL 25 MG PO TABS: 25.0000 mg | ORAL_TABLET | Freq: Four times a day (QID) | ORAL | 0 refills | Status: AC | PRN

## 2024-05-03 MED ORDER — ATORVASTATIN CALCIUM 40 MG PO TABS: 40.0000 mg | ORAL_TABLET | Freq: Every day | ORAL | 1 refills | Status: AC

## 2024-05-03 MED ORDER — CARVEDILOL 3.125 MG PO TABS: 3.1250 mg | ORAL_TABLET | Freq: Two times a day (BID) | ORAL | 3 refills | Status: AC

## 2024-05-03 MED ORDER — BUTALBITAL-APAP-CAFFEINE 50-325-40 MG PO TABS: 1.0000 | ORAL_TABLET | Freq: Four times a day (QID) | ORAL | 1 refills | Status: AC | PRN

## 2024-05-03 MED ORDER — PREDNISONE 10 MG (48) PO TBPK: ORAL_TABLET | Freq: Every day | ORAL | 0 refills | Status: AC

## 2024-05-03 MED ORDER — BLOOD PRESSURE KIT DEVI: 1.0000 | Freq: Every day | 0 refills | Status: AC

## 2024-05-03 MED ORDER — DICYCLOMINE HCL 10 MG PO CAPS: 10.0000 mg | ORAL_CAPSULE | Freq: Three times a day (TID) | ORAL | 1 refills | Status: AC

## 2024-05-03 MED ORDER — ESOMEPRAZOLE MAGNESIUM 40 MG PO CPDR: 40.0000 mg | DELAYED_RELEASE_CAPSULE | Freq: Every day | ORAL | 3 refills | Status: AC

## 2024-05-03 MED ORDER — METOCLOPRAMIDE HCL 5 MG PO TABS: 5.0000 mg | ORAL_TABLET | Freq: Three times a day (TID) | ORAL | 0 refills | Status: AC

## 2024-05-03 MED ORDER — CEFDINIR 300 MG PO CAPS
300.0000 mg | ORAL_CAPSULE | Freq: Two times a day (BID) | ORAL | 0 refills | Status: AC
Start: 2024-05-03 — End: ?

## 2024-05-03 MED ORDER — AMLODIPINE BESYLATE 10 MG PO TABS: 10.0000 mg | ORAL_TABLET | Freq: Every day | ORAL | 1 refills | Status: AC

## 2024-05-03 MED ORDER — CLONIDINE HCL 0.2 MG PO TABS: 0.2000 mg | ORAL_TABLET | Freq: Two times a day (BID) | ORAL | 1 refills | Status: AC

## 2024-05-03 MED ORDER — GABAPENTIN 300 MG PO CAPS
600.0000 mg | ORAL_CAPSULE | Freq: Two times a day (BID) | ORAL | 2 refills | Status: AC
Start: 2024-05-03 — End: ?

## 2024-05-03 MED ORDER — ALBUTEROL SULFATE HFA 108 (90 BASE) MCG/ACT IN AERS: 2.0000 | INHALATION_SPRAY | RESPIRATORY_TRACT | 2 refills | Status: AC | PRN

## 2024-05-03 MED ORDER — FLUTICASONE PROPIONATE HFA 220 MCG/ACT IN AERO: 1.0000 | INHALATION_SPRAY | Freq: Two times a day (BID) | RESPIRATORY_TRACT | 12 refills | Status: AC

## 2024-05-03 MED ORDER — ALPRAZOLAM 1 MG PO TABS: 1.0000 mg | ORAL_TABLET | Freq: Three times a day (TID) | ORAL | 3 refills | Status: AC

## 2024-05-04 LAB — CBC WITH DIFFERENTIAL/PLATELET
Basophils Absolute: 0 x10E3/uL (ref 0.0–0.2)
Basos: 1 %
EOS (ABSOLUTE): 0 x10E3/uL (ref 0.0–0.4)
Eos: 0 %
Hematocrit: 36.4 % (ref 34.0–46.6)
Hemoglobin: 12 g/dL (ref 11.1–15.9)
Immature Grans (Abs): 0 x10E3/uL (ref 0.0–0.1)
Immature Granulocytes: 0 %
Lymphocytes Absolute: 1.2 x10E3/uL (ref 0.7–3.1)
Lymphs: 22 %
MCH: 30.5 pg (ref 26.6–33.0)
MCHC: 33 g/dL (ref 31.5–35.7)
MCV: 93 fL (ref 79–97)
Monocytes Absolute: 0.6 x10E3/uL (ref 0.1–0.9)
Monocytes: 11 %
Neutrophils Absolute: 3.6 x10E3/uL (ref 1.4–7.0)
Neutrophils: 66 %
Platelets: 193 x10E3/uL (ref 150–450)
RBC: 3.93 x10E6/uL (ref 3.77–5.28)
RDW: 11.8 % (ref 11.7–15.4)
WBC: 5.4 x10E3/uL (ref 3.4–10.8)

## 2024-05-04 LAB — LIPID PANEL
Chol/HDL Ratio: 2.5 ratio (ref 0.0–4.4)
Cholesterol, Total: 159 mg/dL (ref 100–199)
HDL: 63 mg/dL (ref >39)
LDL Chol Calc (NIH): 84 mg/dL (ref 0–99)
Triglycerides: 60 mg/dL (ref 0–149)
VLDL Cholesterol Cal: 12 mg/dL (ref 5–40)

## 2024-05-04 LAB — COMPREHENSIVE METABOLIC PANEL WITH GFR
ALT: 20 IU/L (ref 0–32)
AST: 27 IU/L (ref 0–40)
Albumin: 4 g/dL (ref 3.9–4.9)
Alkaline Phosphatase: 108 IU/L (ref 49–135)
BUN/Creatinine Ratio: 9 — ABNORMAL LOW (ref 12–28)
BUN: 6 mg/dL — ABNORMAL LOW (ref 8–27)
Bilirubin Total: 0.6 mg/dL (ref 0.0–1.2)
CO2: 26 mmol/L (ref 20–29)
Calcium: 8.8 mg/dL (ref 8.7–10.3)
Chloride: 90 mmol/L — ABNORMAL LOW (ref 96–106)
Creatinine, Ser: 0.64 mg/dL (ref 0.57–1.00)
Globulin, Total: 3.9 g/dL (ref 1.5–4.5)
Glucose: 94 mg/dL (ref 70–99)
Potassium: 3.9 mmol/L (ref 3.5–5.2)
Sodium: 128 mmol/L — ABNORMAL LOW (ref 134–144)
Total Protein: 7.9 g/dL (ref 6.0–8.5)
eGFR: 99 mL/min/1.73 (ref >59)

## 2024-05-04 LAB — TSH+FREE T4
Free T4: 1.54 ng/dL (ref 0.82–1.77)
TSH: 1.07 u[IU]/mL (ref 0.450–4.500)

## 2024-05-05 ENCOUNTER — Ambulatory Visit: Payer: Self-pay | Admitting: Family Medicine

## 2024-05-05 DIAGNOSIS — R002 Palpitations: Secondary | ICD-10-CM | POA: Insufficient documentation

## 2024-05-05 DIAGNOSIS — R300 Dysuria: Secondary | ICD-10-CM | POA: Insufficient documentation

## 2024-05-05 NOTE — Assessment & Plan Note (Signed)
 EKG shows normal sinus rhythm with an incomplete right bundle blanch block and minimal voltage criteria for LVH.  She is taking metoprolol  succinate 100 mg daily, amlodipine  10 mg and clonidine  0.2 mg 2 times daily yet has uncontrolled hypertension.  Stop metoprolol  succinate and start Coreg 3.125 mg twice daily.  Follow-up in a month for recheck of blood pressure.

## 2024-05-05 NOTE — Assessment & Plan Note (Signed)
 Is voiding around her Foley catheter and would like to have her catheter removed.  Advise she needs to take this up with urology.

## 2024-05-05 NOTE — Assessment & Plan Note (Signed)
 Reports she is having intermittent hematuria.  Was just treated for a Pseudomonas aeruginosa and Serratia.  Will culture her urine and avoid empiric treatment at this time.  She is not running a fever and does not have back pain.

## 2024-05-05 NOTE — Assessment & Plan Note (Signed)
 Refilled her alprazolam  1 mg 3 times daily #90 with 3 refills.

## 2024-05-05 NOTE — Progress Notes (Signed)
 Established Patient Office Visit  Subjective   Patient ID: Katelyn [name], female    DOB: January 16, 1961  Age: 63 y.o. MRN: [id number]  Chief Complaint  Patient presents with   Medical Management of Chronic Issues    HPI Discussed the use of AI scribe software for clinical note transcription with the patient, who gave verbal consent to proceed.  History of Present Illness   Katelyn [name] is a 63 year old female who presents with wheezing and shortness of breath.  She experiences significant wheezing and shortness of breath, describing her chest as feeling 'funny'. She attributes these symptoms to potential causes such as gas, acid reflux, or anxiety. The shortness of breath is severe, and she has been experiencing indigestion with three episodes occurring this morning. She is currently taking Nexium  once a day and has stopped taking Reglan .  She has a history of bladder issues and is currently using a indwelling Foley catheter. She reports hematuria, which she associates with a possible infection.  Hematuria comes and goes she has not had it in a few days now. 03/27/2024 she had a positive culture for Pseudomonas aeruginosa and Serratia and since she took 7 days of levofloxacin .  She has experienced bladder spasms and has had three infections in the last two months. She complains that she has painful bladder spasms.  She drinks water regularly and has been trying to manage her symptoms. She has had a cystoscopy and a CT scan of her kidneys in the past. She is not currently taking oxybutynin  for bladder spasms as it causes constipation and does not help her symptoms. She has also tried phenazopyridine  but it does not help her symptoms either.  She wants to get her Foley out permanently.  She can void around the Foley so she thinks she would pass a voiding trial.  She has been feeling cold without any fever. She has a history of allergies to several medications, including doxycycline ,  which causes gastrointestinal upset, and sulfa drugs, which cause a rash.   She is under stress due to personal circumstances, including her daughter quitting her job and financial pressures. She smokes outside and acknowledges the difficulty in quitting despite her son's encouragement. She is currently taking blood pressure medications, including metoprolol  succinate, amlodipine , and clonidine . She reports feeling 'funny' in her chest, describing it as 'fluttering'.  She has been taking Bentyl  for stomach issues and prefers it over Levsin , which exacerbates her chest discomfort.     ROS    Objective:     BP (!) 179/115 (BP Location: Left Arm, Patient Position: Sitting, Cuff Size: Normal)   Pulse 79   Temp (!) 97.5 F (36.4 C) (Oral)   Resp 20   Ht 5' 5 (1.651 m)   Wt 151 lb (68.5 kg)   SpO2 97%   BMI 25.13 kg/m    Physical Exam Vitals and nursing note reviewed.  Constitutional:      Appearance: Normal appearance.  HENT:     Head: Normocephalic and atraumatic.  Eyes:     Conjunctiva/sclera: Conjunctivae normal.  Cardiovascular:     Rate and Rhythm: Normal rate and regular rhythm.  Pulmonary:     Effort: Pulmonary effort is normal.     Breath sounds: Normal breath sounds.  Musculoskeletal:     Right lower leg: No edema.     Left lower leg: No edema.  Skin:    General: Skin is warm and dry.  Neurological:  Mental Status: She is alert and oriented to person, place, and time.  Psychiatric:        Mood and Affect: Mood normal.        Behavior: Behavior normal.        Thought Content: Thought content normal.        Judgment: Judgment normal.          Results for orders placed or performed in visit on 05/03/24  CBC with Differential/Platelet  Result Value Ref Range   WBC 5.4 3.4 - 10.8 x10E3/uL   RBC 3.93 3.77 - 5.28 x10E6/uL   Hemoglobin 12.0 11.1 - 15.9 g/dL   Hematocrit 63.5 65.9 - 46.6 %   MCV 93 79 - 97 fL   MCH 30.5 26.6 - 33.0 pg   MCHC 33.0 31.5  - 35.7 g/dL   RDW 88.1 88.2 - 84.5 %   Platelets 193 150 - 450 x10E3/uL   Neutrophils 66 Not Estab. %   Lymphs 22 Not Estab. %   Monocytes 11 Not Estab. %   Eos 0 Not Estab. %   Basos 1 Not Estab. %   Neutrophils Absolute 3.6 1.4 - 7.0 x10E3/uL   Lymphocytes Absolute 1.2 0.7 - 3.1 x10E3/uL   Monocytes Absolute 0.6 0.1 - 0.9 x10E3/uL   EOS (ABSOLUTE) 0.0 0.0 - 0.4 x10E3/uL   Basophils Absolute 0.0 0.0 - 0.2 x10E3/uL   Immature Granulocytes 0 Not Estab. %   Immature Grans (Abs) 0.0 0.0 - 0.1 x10E3/uL  Lipid panel  Result Value Ref Range   Cholesterol, Total 159 100 - 199 mg/dL   Triglycerides 60 0 - 149 mg/dL   HDL 63 >60 mg/dL   VLDL Cholesterol Cal 12 5 - 40 mg/dL   LDL Chol Calc (NIH) 84 0 - 99 mg/dL   Chol/HDL Ratio 2.5 0.0 - 4.4 ratio  Comprehensive metabolic panel with GFR  Result Value Ref Range   Glucose 94 70 - 99 mg/dL   BUN 6 (L) 8 - 27 mg/dL   Creatinine, Ser 9.35 0.57 - 1.00 mg/dL   eGFR 99 >40 fO/fpw/8.26   BUN/Creatinine Ratio 9 (L) 12 - 28   Sodium 128 (L) 134 - 144 mmol/L   Potassium 3.9 3.5 - 5.2 mmol/L   Chloride 90 (L) 96 - 106 mmol/L   CO2 26 20 - 29 mmol/L   Calcium  8.8 8.7 - 10.3 mg/dL   Total Protein 7.9 6.0 - 8.5 g/dL   Albumin  4.0 3.9 - 4.9 g/dL   Globulin, Total 3.9 1.5 - 4.5 g/dL   Bilirubin Total 0.6 0.0 - 1.2 mg/dL   Alkaline Phosphatase 108 49 - 135 IU/L   AST 27 0 - 40 IU/L   ALT 20 0 - 32 IU/L  TSH + free T4  Result Value Ref Range   TSH 1.070 0.450 - 4.500 uIU/mL   Free T4 1.54 0.82 - 1.77 ng/dL      The 89-bzjm ASCVD risk score (Arnett DK, et al., 2019) is: 19.3%    Assessment & Plan:  Fluttering heart -     EKG 12-Lead  Centrilobular emphysema (HCC) -     Albuterol  Sulfate HFA; Inhale 2 puffs into the lungs every 4 (four) hours as needed for wheezing or shortness of breath.  Dispense: 18 each; Refill: 2 -     Fluticasone  Propionate HFA; Inhale 1 puff into the lungs in the morning and at bedtime.  Dispense: 1 each; Refill:  12  Anxiety -  ALPRAZolam ; Take 1 tablet (1 mg total) by mouth 3 (three) times daily.  Dispense: 90 tablet; Refill: 3  Primary hypertension -     amLODIPine  Besylate; Take 1 tablet (10 mg total) by mouth daily.  Dispense: 90 tablet; Refill: 1 -     Atorvastatin  Calcium ; Take 1 tablet (40 mg total) by mouth daily.  Dispense: 90 tablet; Refill: 1 -     Blood Pressure Kit; 1 each by Does not apply route daily.  Dispense: 1 each; Refill: 0 -     cloNIDine  HCl; Take 1 tablet (0.2 mg total) by mouth 2 (two) times daily.  Dispense: 180 tablet; Refill: 1 -     Carvedilol; Take 1 tablet (3.125 mg total) by mouth 2 (two) times daily with a meal.  Dispense: 60 tablet; Refill: 3 -     CBC with Differential/Platelet -     Lipid panel -     Comprehensive metabolic panel with GFR -     TSH + free T4  Migraine without aura and without status migrainosus, not intractable -     Butalbital -APAP-Caffeine ; Take 1 tablet by mouth every 6 (six) hours as needed for headache or migraine.  Dispense: 90 tablet; Refill: 1  GERD without esophagitis -     Esomeprazole  Magnesium ; Take 1 capsule (40 mg total) by mouth daily.  Dispense: 90 capsule; Refill: 3 -     Metoclopramide  HCl; Take 1 tablet (5 mg total) by mouth 4 (four) times daily -  before meals and at bedtime.  Dispense: 120 tablet; Refill: 0 -     Promethazine  HCl; Take 1 tablet (25 mg total) by mouth every 6 (six) hours as needed for nausea or vomiting.  Dispense: 30 tablet; Refill: 0  Lumbar radiculopathy -     Gabapentin ; Take 2 capsules (600 mg total) by mouth 2 (two) times daily.  Dispense: 360 capsule; Refill: 2  Stomach cramps  Depression, major, single episode, moderate (HCC) -     QUEtiapine  Fumarate; Take 1 tablet (100 mg total) by mouth at bedtime.  Dispense: 90 tablet; Refill: 1  Acute bronchitis with COPD (HCC) -     predniSONE ; Take by mouth daily. 12-day taper pack, use as directed for taper  Dispense: 1 tablet; Refill: 0 -      Cefdinir ; Take 1 capsule (300 mg total) by mouth 2 (two) times daily.  Dispense: 14 capsule; Refill: 0  Immunization due -     Flu vaccine trivalent PF, 6mos and older(Flulaval,Afluria,Fluarix,Fluzone )  Dysuria -     Urine Culture  Fluttering sensation of heart Assessment & Plan: EKG shows normal sinus rhythm with an incomplete right bundle blanch block and minimal voltage criteria for LVH.  She is taking metoprolol  succinate 100 mg daily, amlodipine  10 mg and clonidine  0.2 mg 2 times daily yet has uncontrolled hypertension.  Stop metoprolol  succinate and start Coreg 3.125 mg twice daily.  Follow-up in a month for recheck of blood pressure.   Anxiety and depression Assessment & Plan: Refilled her alprazolam  1 mg 3 times daily #90 with 3 refills.   Chronic indwelling Foley catheter Assessment & Plan: Is voiding around her Foley catheter and would like to have her catheter removed.  Advise she needs to take this up with urology.   Other orders -     Dicyclomine  HCl; Take 1 capsule (10 mg total) by mouth 4 (four) times daily -  before meals and at bedtime.  Dispense: 360 capsule; Refill: 1  Return in about 4 weeks (around 05/31/2024).    Zenas Santa K Octavious Zidek, MD

## 2024-05-06 LAB — URINE CULTURE

## 2024-05-08 ENCOUNTER — Other Ambulatory Visit: Payer: Self-pay

## 2024-05-08 ENCOUNTER — Ambulatory Visit (INDEPENDENT_AMBULATORY_CARE_PROVIDER_SITE_OTHER): Admitting: Physician Assistant

## 2024-05-08 VITALS — BP 207/115 | HR 103

## 2024-05-08 DIAGNOSIS — N3289 Other specified disorders of bladder: Secondary | ICD-10-CM | POA: Diagnosis not present

## 2024-05-08 DIAGNOSIS — R3989 Other symptoms and signs involving the genitourinary system: Secondary | ICD-10-CM | POA: Diagnosis not present

## 2024-05-08 DIAGNOSIS — Z466 Encounter for fitting and adjustment of urinary device: Secondary | ICD-10-CM | POA: Diagnosis not present

## 2024-05-08 DIAGNOSIS — R339 Retention of urine, unspecified: Secondary | ICD-10-CM

## 2024-05-08 LAB — MICROSCOPIC EXAMINATION

## 2024-05-08 NOTE — Progress Notes (Signed)
 Cath Change/ Replacement  Patient is present today for a catheter change due to urinary retention.  8ml of water was removed from the balloon, a 18FR Silastic foley cath was removed without difficulty.  Patient was cleaned and prepped in a sterile fashion with betadine and 2% lidocaine  jelly was instilled into the urethra. A 18 FR foley cath was replaced into the bladder, no complications were noted. Urine return was noted 60ml and urine was clear in color. The balloon was filled with 10ml of sterile water. A night bag was attached for drainage.  Patient tolerated well.    Performed by: Doshia Dalia, PA-C  Additional notes: She reports frequent UTIs, with symptoms including malodorous urine, gross hematuria, and low back pain.  She thinks this started at the same time that we switched her from standard latex to Silastic catheters.  Will switch back to standard latex catheters.  She is on Omnicef  right now per her PCP and urine culture grew multiple species.  Her symptoms have improved somewhat.  I obtained a urine sample from the new catheter today for repeat UA/culture.  We also discussed that malodorous or cloudy urine are normal with a chronic Foley and these alone are not indicators of UTI.  She also reports ongoing bladder spasms.  I question an element of catheter encrustation and poor drainage, see prior notes.  She is adamant again today that she is unable to irrigate her catheter.  She is already on oxybutynin  XL 10 mg for bladder spasms and Gemtesa  gave her headache.  She asks what she needs to do today to get off her catheter.  We again discussed recommendations to follow-up at [facility] for urodynamics.  She seems open to this.  I will check the status of her prior referral and rerefer if needed.  Follow up: Return in about 4 weeks (around 06/05/2024) for Catheter exchange.

## 2024-05-09 ENCOUNTER — Telehealth: Payer: Self-pay

## 2024-05-09 LAB — URINALYSIS, COMPLETE
Bilirubin, UA: NEGATIVE
Glucose, UA: NEGATIVE
Ketones, UA: NEGATIVE
Nitrite, UA: NEGATIVE
Protein,UA: NEGATIVE
Specific Gravity, UA: <1.005 — ABNORMAL LOW (ref 1.005–1.030)
Urobilinogen, Ur: 0.2 mg/dL (ref 0.2–1.0)
pH, UA: 6 (ref 5.0–7.5)

## 2024-05-09 LAB — MICROSCOPIC EXAMINATION

## 2024-05-09 NOTE — Telephone Encounter (Signed)
 1st phone call attempted to make patient aware referral has been sent to [facility] (Urology). [facility] will reach out to schedule appointment.

## 2024-05-13 ENCOUNTER — Ambulatory Visit: Payer: Self-pay | Admitting: Physician Assistant

## 2024-05-13 LAB — CULTURE, URINE COMPREHENSIVE

## 2024-05-24 ENCOUNTER — Ambulatory Visit: Admitting: Family Medicine

## 2024-06-05 ENCOUNTER — Ambulatory Visit: Admitting: Physician Assistant

## 2024-06-26 ENCOUNTER — Ambulatory Visit: Admitting: Physician Assistant
